# Patient Record
Sex: Female | Born: 1935 | Race: White | Hispanic: No | Smoking: Never smoker
Health system: Southern US, Community
[De-identification: ages and names within clinical notes are randomized; demographics above are authoritative.]

## PROBLEM LIST (undated history)

## (undated) DIAGNOSIS — I4430 Unspecified atrioventricular block: Secondary | ICD-10-CM

## (undated) DIAGNOSIS — I6200 Nontraumatic subdural hemorrhage, unspecified: Secondary | ICD-10-CM

## (undated) DIAGNOSIS — I5042 Chronic combined systolic (congestive) and diastolic (congestive) heart failure: Secondary | ICD-10-CM

## (undated) DIAGNOSIS — E876 Hypokalemia: Secondary | ICD-10-CM

## (undated) DIAGNOSIS — I10 Essential (primary) hypertension: Secondary | ICD-10-CM

## (undated) DIAGNOSIS — I639 Cerebral infarction, unspecified: Secondary | ICD-10-CM

## (undated) DIAGNOSIS — I255 Ischemic cardiomyopathy: Secondary | ICD-10-CM

## (undated) DIAGNOSIS — F419 Anxiety disorder, unspecified: Secondary | ICD-10-CM

## (undated) DIAGNOSIS — M1990 Unspecified osteoarthritis, unspecified site: Secondary | ICD-10-CM

## (undated) DIAGNOSIS — M8580 Other specified disorders of bone density and structure, unspecified site: Secondary | ICD-10-CM

## (undated) DIAGNOSIS — F411 Generalized anxiety disorder: Secondary | ICD-10-CM

## (undated) DIAGNOSIS — K760 Fatty (change of) liver, not elsewhere classified: Secondary | ICD-10-CM

## (undated) DIAGNOSIS — E785 Hyperlipidemia, unspecified: Secondary | ICD-10-CM

## (undated) DIAGNOSIS — G4730 Sleep apnea, unspecified: Secondary | ICD-10-CM

## (undated) DIAGNOSIS — K5792 Diverticulitis of intestine, part unspecified, without perforation or abscess without bleeding: Secondary | ICD-10-CM

## (undated) DIAGNOSIS — D696 Thrombocytopenia, unspecified: Secondary | ICD-10-CM

## (undated) DIAGNOSIS — R531 Weakness: Secondary | ICD-10-CM

## (undated) DIAGNOSIS — E039 Hypothyroidism, unspecified: Secondary | ICD-10-CM

## (undated) DIAGNOSIS — I2510 Atherosclerotic heart disease of native coronary artery without angina pectoris: Secondary | ICD-10-CM

## (undated) DIAGNOSIS — M549 Dorsalgia, unspecified: Secondary | ICD-10-CM

## (undated) HISTORY — DX: Cerebral infarction, unspecified: I63.9

## (undated) HISTORY — DX: Hypokalemia: E87.6

## (undated) HISTORY — DX: Chronic combined systolic (congestive) and diastolic (congestive) heart failure: I50.42

## (undated) HISTORY — DX: Ischemic cardiomyopathy: I25.5

## (undated) HISTORY — PX: ELBOW SURGERY: SHX618

## (undated) HISTORY — DX: Unspecified atrioventricular block: I44.30

## (undated) HISTORY — DX: Thrombocytopenia, unspecified: D69.6

## (undated) HISTORY — DX: Hypomagnesemia: E83.42

---

## 2004-03-20 ENCOUNTER — Ambulatory Visit: Payer: Self-pay

## 2005-04-08 ENCOUNTER — Ambulatory Visit: Payer: Self-pay

## 2005-05-21 ENCOUNTER — Ambulatory Visit: Payer: Self-pay

## 2005-06-01 ENCOUNTER — Ambulatory Visit: Payer: Self-pay

## 2006-06-03 ENCOUNTER — Ambulatory Visit: Payer: Self-pay

## 2006-12-27 ENCOUNTER — Ambulatory Visit: Payer: Self-pay

## 2007-02-07 ENCOUNTER — Ambulatory Visit: Payer: Self-pay | Admitting: Physician Assistant

## 2007-06-15 ENCOUNTER — Ambulatory Visit: Payer: Self-pay | Admitting: Family Medicine

## 2008-06-18 ENCOUNTER — Ambulatory Visit: Payer: Self-pay | Admitting: Family Medicine

## 2009-01-27 ENCOUNTER — Emergency Department (HOSPITAL_COMMUNITY): Admission: AC | Admit: 2009-01-27 | Discharge: 2009-01-27 | Payer: Self-pay | Admitting: Emergency Medicine

## 2009-06-19 ENCOUNTER — Ambulatory Visit: Payer: Self-pay | Admitting: Family Medicine

## 2009-12-25 ENCOUNTER — Ambulatory Visit: Payer: Self-pay | Admitting: Family Medicine

## 2010-06-24 ENCOUNTER — Ambulatory Visit: Payer: Self-pay | Admitting: Family Medicine

## 2010-10-14 DIAGNOSIS — K5790 Diverticulosis of intestine, part unspecified, without perforation or abscess without bleeding: Secondary | ICD-10-CM | POA: Insufficient documentation

## 2010-10-14 DIAGNOSIS — M159 Polyosteoarthritis, unspecified: Secondary | ICD-10-CM | POA: Insufficient documentation

## 2010-10-14 DIAGNOSIS — E039 Hypothyroidism, unspecified: Secondary | ICD-10-CM | POA: Diagnosis present

## 2010-10-14 DIAGNOSIS — G8929 Other chronic pain: Secondary | ICD-10-CM | POA: Insufficient documentation

## 2011-03-10 ENCOUNTER — Ambulatory Visit: Payer: Self-pay | Admitting: Family Medicine

## 2011-06-22 DIAGNOSIS — M19019 Primary osteoarthritis, unspecified shoulder: Secondary | ICD-10-CM | POA: Insufficient documentation

## 2011-07-14 ENCOUNTER — Ambulatory Visit: Payer: Self-pay | Admitting: Family Medicine

## 2012-07-19 ENCOUNTER — Ambulatory Visit: Payer: Self-pay | Admitting: Family Medicine

## 2012-07-29 ENCOUNTER — Ambulatory Visit: Payer: Self-pay | Admitting: Family Medicine

## 2012-08-03 DIAGNOSIS — M5417 Radiculopathy, lumbosacral region: Secondary | ICD-10-CM | POA: Insufficient documentation

## 2012-10-25 DIAGNOSIS — Z8719 Personal history of other diseases of the digestive system: Secondary | ICD-10-CM | POA: Insufficient documentation

## 2013-03-17 ENCOUNTER — Emergency Department: Payer: Self-pay | Admitting: Emergency Medicine

## 2013-03-17 LAB — BASIC METABOLIC PANEL
Anion Gap: 3 — ABNORMAL LOW (ref 7–16)
Calcium, Total: 10.3 mg/dL — ABNORMAL HIGH (ref 8.5–10.1)
Chloride: 100 mmol/L (ref 98–107)
Co2: 30 mmol/L (ref 21–32)
Creatinine: 0.64 mg/dL (ref 0.60–1.30)
EGFR (African American): >60
EGFR (Non-African Amer.): >60
Glucose: 105 mg/dL — ABNORMAL HIGH (ref 65–99)

## 2013-03-17 LAB — CBC WITH DIFFERENTIAL/PLATELET
Basophil #: 0 10*3/uL (ref 0.0–0.1)
Basophil %: 0.4 %
HGB: 11.2 g/dL — ABNORMAL LOW (ref 12.0–16.0)
Lymphocyte #: 1 10*3/uL (ref 1.0–3.6)
Lymphocyte %: 14.2 %
MCH: 30.2 pg (ref 26.0–34.0)
MCHC: 34.6 g/dL (ref 32.0–36.0)
Monocyte #: 0.7 x10 3/mm (ref 0.2–0.9)
Monocyte %: 9.5 %
Platelet: 181 10*3/uL (ref 150–440)
RBC: 3.7 10*6/uL — ABNORMAL LOW (ref 3.80–5.20)
WBC: 6.9 10*3/uL (ref 3.6–11.0)

## 2013-03-17 LAB — PROTIME-INR: Prothrombin Time: 13.6 secs (ref 11.5–14.7)

## 2013-03-26 ENCOUNTER — Emergency Department: Payer: Self-pay | Admitting: Emergency Medicine

## 2013-03-26 LAB — URINALYSIS, COMPLETE
BACTERIA: NONE SEEN
Bilirubin,UR: NEGATIVE
Glucose,UR: NEGATIVE mg/dL (ref 0–75)
Leukocyte Esterase: NEGATIVE
Nitrite: NEGATIVE
Ph: 6 (ref 4.5–8.0)
RBC,UR: 14 /HPF (ref 0–5)
SQUAMOUS EPITHELIAL: NONE SEEN
Specific Gravity: 1.015 (ref 1.003–1.030)

## 2013-03-26 LAB — COMPREHENSIVE METABOLIC PANEL
ALK PHOS: 196 U/L — AB
ALT: 16 U/L (ref 12–78)
ANION GAP: 6 — AB (ref 7–16)
Albumin: 3.2 g/dL — ABNORMAL LOW (ref 3.4–5.0)
BUN: 15 mg/dL (ref 7–18)
Bilirubin,Total: 0.4 mg/dL (ref 0.2–1.0)
CALCIUM: 10.8 mg/dL — AB (ref 8.5–10.1)
Chloride: 100 mmol/L (ref 98–107)
Co2: 30 mmol/L (ref 21–32)
Creatinine: 0.67 mg/dL (ref 0.60–1.30)
Glucose: 104 mg/dL — ABNORMAL HIGH (ref 65–99)
OSMOLALITY: 273 (ref 275–301)
Potassium: 4.2 mmol/L (ref 3.5–5.1)
SGOT(AST): 21 U/L (ref 15–37)
Sodium: 136 mmol/L (ref 136–145)
Total Protein: 7.8 g/dL (ref 6.4–8.2)

## 2013-03-26 LAB — PROTIME-INR
INR: 1
Prothrombin Time: 13 secs (ref 11.5–14.7)

## 2013-03-26 LAB — CBC
HCT: 36.1 % (ref 35.0–47.0)
HGB: 12.4 g/dL (ref 12.0–16.0)
MCH: 30 pg (ref 26.0–34.0)
MCHC: 34.3 g/dL (ref 32.0–36.0)
MCV: 87 fL (ref 80–100)
Platelet: 323 10*3/uL (ref 150–440)
RBC: 4.13 10*6/uL (ref 3.80–5.20)
RDW: 14.1 % (ref 11.5–14.5)
WBC: 8.8 10*3/uL (ref 3.6–11.0)

## 2013-03-26 LAB — TROPONIN I

## 2013-03-26 LAB — APTT: Activated PTT: 30.9 secs (ref 23.6–35.9)

## 2013-05-27 DIAGNOSIS — M4807 Spinal stenosis, lumbosacral region: Secondary | ICD-10-CM | POA: Insufficient documentation

## 2013-08-24 ENCOUNTER — Ambulatory Visit: Payer: Self-pay | Admitting: Family Medicine

## 2013-08-31 ENCOUNTER — Ambulatory Visit: Payer: Self-pay | Admitting: Neurology

## 2013-12-26 DIAGNOSIS — Z96641 Presence of right artificial hip joint: Secondary | ICD-10-CM | POA: Insufficient documentation

## 2013-12-26 DIAGNOSIS — M19011 Primary osteoarthritis, right shoulder: Secondary | ICD-10-CM | POA: Insufficient documentation

## 2013-12-26 DIAGNOSIS — M1611 Unilateral primary osteoarthritis, right hip: Secondary | ICD-10-CM | POA: Insufficient documentation

## 2014-08-02 ENCOUNTER — Other Ambulatory Visit: Payer: Self-pay | Admitting: Family Medicine

## 2014-08-02 DIAGNOSIS — M8580 Other specified disorders of bone density and structure, unspecified site: Secondary | ICD-10-CM

## 2014-08-15 ENCOUNTER — Ambulatory Visit
Admission: RE | Admit: 2014-08-15 | Discharge: 2014-08-15 | Disposition: A | Discharge status: Home / Self Care | Payer: Medicare Other | Source: Ambulatory Visit | Attending: Family Medicine | Admitting: Family Medicine

## 2014-08-15 DIAGNOSIS — M810 Age-related osteoporosis without current pathological fracture: Secondary | ICD-10-CM | POA: Diagnosis not present

## 2014-08-15 DIAGNOSIS — Z13820 Encounter for screening for osteoporosis: Secondary | ICD-10-CM | POA: Insufficient documentation

## 2014-08-15 DIAGNOSIS — R29890 Loss of height: Secondary | ICD-10-CM | POA: Diagnosis not present

## 2014-08-15 DIAGNOSIS — M1990 Unspecified osteoarthritis, unspecified site: Secondary | ICD-10-CM | POA: Insufficient documentation

## 2014-08-15 DIAGNOSIS — Z780 Asymptomatic menopausal state: Secondary | ICD-10-CM | POA: Insufficient documentation

## 2014-08-15 DIAGNOSIS — M8580 Other specified disorders of bone density and structure, unspecified site: Secondary | ICD-10-CM

## 2014-09-11 ENCOUNTER — Other Ambulatory Visit: Payer: Self-pay | Admitting: Family Medicine

## 2014-09-11 DIAGNOSIS — Z1231 Encounter for screening mammogram for malignant neoplasm of breast: Secondary | ICD-10-CM

## 2014-09-12 ENCOUNTER — Ambulatory Visit
Admission: RE | Admit: 2014-09-12 | Discharge: 2014-09-12 | Disposition: A | Discharge status: Home / Self Care | Payer: Medicare Other | Source: Ambulatory Visit | Attending: Family Medicine | Admitting: Family Medicine

## 2014-09-12 DIAGNOSIS — Z1231 Encounter for screening mammogram for malignant neoplasm of breast: Secondary | ICD-10-CM | POA: Diagnosis not present

## 2015-08-07 ENCOUNTER — Other Ambulatory Visit: Payer: Self-pay | Admitting: Family Medicine

## 2015-08-07 ENCOUNTER — Other Ambulatory Visit (HOSPITAL_COMMUNITY): Payer: Self-pay | Admitting: Family Medicine

## 2015-08-07 DIAGNOSIS — Z1231 Encounter for screening mammogram for malignant neoplasm of breast: Secondary | ICD-10-CM

## 2015-09-05 ENCOUNTER — Emergency Department
Admission: EM | Admit: 2015-09-05 | Discharge: 2015-09-05 | Disposition: A | Discharge status: Home / Self Care | Payer: Medicare Other | Source: Home / Self Care | Attending: Emergency Medicine | Admitting: Emergency Medicine

## 2015-09-05 ENCOUNTER — Emergency Department: Payer: Medicare Other

## 2015-09-05 DIAGNOSIS — E785 Hyperlipidemia, unspecified: Secondary | ICD-10-CM | POA: Insufficient documentation

## 2015-09-05 DIAGNOSIS — I252 Old myocardial infarction: Secondary | ICD-10-CM | POA: Insufficient documentation

## 2015-09-05 DIAGNOSIS — R112 Nausea with vomiting, unspecified: Secondary | ICD-10-CM | POA: Insufficient documentation

## 2015-09-05 DIAGNOSIS — I10 Essential (primary) hypertension: Secondary | ICD-10-CM

## 2015-09-05 DIAGNOSIS — R1033 Periumbilical pain: Secondary | ICD-10-CM | POA: Insufficient documentation

## 2015-09-05 DIAGNOSIS — Z791 Long term (current) use of non-steroidal anti-inflammatories (NSAID): Secondary | ICD-10-CM

## 2015-09-05 DIAGNOSIS — Z8679 Personal history of other diseases of the circulatory system: Secondary | ICD-10-CM | POA: Insufficient documentation

## 2015-09-05 DIAGNOSIS — I2510 Atherosclerotic heart disease of native coronary artery without angina pectoris: Secondary | ICD-10-CM

## 2015-09-05 DIAGNOSIS — E039 Hypothyroidism, unspecified: Secondary | ICD-10-CM

## 2015-09-05 DIAGNOSIS — Z79899 Other long term (current) drug therapy: Secondary | ICD-10-CM

## 2015-09-05 DIAGNOSIS — M1990 Unspecified osteoarthritis, unspecified site: Secondary | ICD-10-CM

## 2015-09-05 DIAGNOSIS — R197 Diarrhea, unspecified: Secondary | ICD-10-CM

## 2015-09-05 DIAGNOSIS — I6200 Nontraumatic subdural hemorrhage, unspecified: Secondary | ICD-10-CM | POA: Diagnosis not present

## 2015-09-05 DIAGNOSIS — I214 Non-ST elevation (NSTEMI) myocardial infarction: Secondary | ICD-10-CM | POA: Diagnosis not present

## 2015-09-05 HISTORY — DX: Other specified disorders of bone density and structure, unspecified site: M85.80

## 2015-09-05 HISTORY — DX: Diverticulitis of intestine, part unspecified, without perforation or abscess without bleeding: K57.92

## 2015-09-05 HISTORY — DX: Sleep apnea, unspecified: G47.30

## 2015-09-05 HISTORY — DX: Weakness: R53.1

## 2015-09-05 HISTORY — DX: Hypothyroidism, unspecified: E03.9

## 2015-09-05 HISTORY — DX: Essential (primary) hypertension: I10

## 2015-09-05 HISTORY — DX: Dorsalgia, unspecified: M54.9

## 2015-09-05 HISTORY — DX: Generalized anxiety disorder: F41.1

## 2015-09-05 LAB — COMPREHENSIVE METABOLIC PANEL
ALBUMIN: 4.3 g/dL (ref 3.5–5.0)
ALK PHOS: 64 U/L (ref 38–126)
ALT: 18 U/L (ref 14–54)
AST: 58 U/L — AB (ref 15–41)
Anion gap: 11 (ref 5–15)
BILIRUBIN TOTAL: 0.8 mg/dL (ref 0.3–1.2)
BUN: 14 mg/dL (ref 6–20)
CO2: 25 mmol/L (ref 22–32)
CREATININE: 0.68 mg/dL (ref 0.44–1.00)
Calcium: 10.1 mg/dL (ref 8.9–10.3)
Chloride: 104 mmol/L (ref 101–111)
GFR calc Af Amer: >60 mL/min (ref >60)
GFR calc non Af Amer: >60 mL/min (ref >60)
GLUCOSE: 129 mg/dL — AB (ref 65–99)
POTASSIUM: 3.8 mmol/L (ref 3.5–5.1)
Sodium: 140 mmol/L (ref 135–145)
TOTAL PROTEIN: 7 g/dL (ref 6.5–8.1)

## 2015-09-05 LAB — URINALYSIS COMPLETE WITH MICROSCOPIC (ARMC ONLY)
BILIRUBIN URINE: NEGATIVE
GLUCOSE, UA: NEGATIVE mg/dL
HGB URINE DIPSTICK: NEGATIVE
LEUKOCYTES UA: NEGATIVE
Nitrite: NEGATIVE
Protein, ur: 30 mg/dL — AB
SPECIFIC GRAVITY, URINE: 1.013 (ref 1.005–1.030)
pH: 7 (ref 5.0–8.0)

## 2015-09-05 LAB — CBC
HEMATOCRIT: 43.5 % (ref 35.0–47.0)
HEMOGLOBIN: 14.9 g/dL (ref 12.0–16.0)
MCH: 31.3 pg (ref 26.0–34.0)
MCHC: 34.4 g/dL (ref 32.0–36.0)
MCV: 91.1 fL (ref 80.0–100.0)
Platelets: 116 10*3/uL — ABNORMAL LOW (ref 150–440)
RBC: 4.77 MIL/uL (ref 3.80–5.20)
RDW: 13.9 % (ref 11.5–14.5)
WBC: 6.9 10*3/uL (ref 3.6–11.0)

## 2015-09-05 LAB — LIPASE, BLOOD: Lipase: 29 U/L (ref 11–51)

## 2015-09-05 MED ORDER — IOPAMIDOL (ISOVUE-300) INJECTION 61%
100.0000 mL | Freq: Once | INTRAVENOUS | Status: AC | PRN
  Administered 2015-09-05: 100 mL via INTRAVENOUS

## 2015-09-05 MED ORDER — SODIUM CHLORIDE 0.9 % IV BOLUS (SEPSIS): 1000.0000 mL | Freq: Once | INTRAVENOUS | Status: DC

## 2015-09-05 MED ORDER — SODIUM CHLORIDE 0.9 % IV BOLUS (SEPSIS)
1000.0000 mL | Freq: Once | INTRAVENOUS | Status: AC
  Administered 2015-09-05: 1000 mL via INTRAVENOUS

## 2015-09-05 MED ORDER — METOCLOPRAMIDE HCL 5 MG/ML IJ SOLN
10.0000 mg | Freq: Once | INTRAMUSCULAR | Status: AC
  Administered 2015-09-05: 10 mg via INTRAVENOUS

## 2015-09-05 MED ORDER — DIATRIZOATE MEGLUMINE & SODIUM 66-10 % PO SOLN
15.0000 mL | Freq: Once | ORAL | Status: AC
  Administered 2015-09-05: 15 mL via ORAL

## 2015-09-05 MED ORDER — METOCLOPRAMIDE HCL 5 MG/ML IJ SOLN
INTRAMUSCULAR | Status: AC
  Administered 2015-09-05: 10 mg via INTRAVENOUS
  Filled 2015-09-05: qty 2

## 2015-09-05 MED ORDER — PROMETHAZINE HCL 25 MG RE SUPP
25.0000 mg | Freq: Four times a day (QID) | RECTAL | Status: DC | PRN
Start: 2015-09-05 — End: 2015-12-02

## 2015-09-05 MED ORDER — METOCLOPRAMIDE HCL 5 MG PO TABS: 5.0000 mg | ORAL_TABLET | Freq: Three times a day (TID) | ORAL | Status: DC | PRN

## 2015-09-05 MED ORDER — SIMETHICONE 80 MG PO CHEW: 80.0000 mg | CHEWABLE_TABLET | Freq: Four times a day (QID) | ORAL | Status: DC | PRN

## 2015-09-05 MED ORDER — ONDANSETRON HCL 4 MG/2ML IJ SOLN
INTRAMUSCULAR | Status: AC
  Administered 2015-09-05: 4 mg via INTRAVENOUS
  Filled 2015-09-05: qty 2

## 2015-09-05 MED ORDER — KETOROLAC TROMETHAMINE 30 MG/ML IJ SOLN
30.0000 mg | Freq: Once | INTRAMUSCULAR | Status: AC
  Administered 2015-09-05: 30 mg via INTRAVENOUS
  Filled 2015-09-05: qty 1

## 2015-09-05 MED ORDER — METOCLOPRAMIDE HCL 10 MG PO TABS
5.0000 mg | ORAL_TABLET | Freq: Once | ORAL | Status: AC
  Administered 2015-09-05: 5 mg via ORAL
  Filled 2015-09-05: qty 1

## 2015-09-05 MED ORDER — FENTANYL CITRATE (PF) 100 MCG/2ML IJ SOLN: 50.0000 ug | Freq: Once | INTRAMUSCULAR | Status: DC

## 2015-09-05 MED ORDER — ONDANSETRON HCL 4 MG/2ML IJ SOLN: 4.0000 mg | Freq: Once | INTRAMUSCULAR | Status: DC

## 2015-09-05 MED ORDER — ONDANSETRON HCL 4 MG/2ML IJ SOLN
4.0000 mg | Freq: Once | INTRAMUSCULAR | Status: AC
  Administered 2015-09-05: 4 mg via INTRAVENOUS

## 2015-09-05 MED ORDER — FENTANYL CITRATE (PF) 100 MCG/2ML IJ SOLN
50.0000 ug | Freq: Once | INTRAMUSCULAR | Status: AC
  Administered 2015-09-05: 50 ug via INTRAVENOUS

## 2015-09-05 MED ORDER — FENTANYL CITRATE (PF) 100 MCG/2ML IJ SOLN
INTRAMUSCULAR | Status: AC
  Administered 2015-09-05: 50 ug via INTRAVENOUS
  Filled 2015-09-05: qty 2

## 2015-09-05 NOTE — ED Notes (Signed)
Pt placed on 2L oxygen 

## 2015-09-05 NOTE — ED Notes (Signed)
Pt from home via EMS, reports mid-abd pain since this morning with constant nausea, reports initial diarrhea but took peptp-bismal and has not had a bowel movement since.

## 2015-09-05 NOTE — Discharge Instructions (Signed)
Please take a clear liquid diet for the next 24-48 hours, then advance to a bland BRAT diet as described. May take Tylenol or Motrin for pain, and simethicone if you feel like you have gas. Reglan is for nausea and vomiting.  Return to the emergency department if you develop severe pain, fever, inability to keep down fluids, lightheadedness or fainting, or any other symptoms concerning to you.

## 2015-09-05 NOTE — ED Provider Notes (Signed)
[facility] Emergency Department Provider Note  ____________________________________________  Time seen: Approximately 9:06 PM  I have reviewed the triage vital signs and the nursing notes.   HISTORY  Chief Complaint Abdominal Pain    HPI Teresa Franklin is a 82 y.o. female with a history of hypertension, S/P remote appendectomy, s/p remote G-tubesent in with periumbilical pain, nausea and a single episode of diarrhea. The patient reports that this morning she awoke with a severe periumbilical pain, and had one episode of stool. Since then, she has continued to pass gas. She has had severe nausea and has tried to make her vomit but has been unable to do so. She denies any fever, chills, dysuria, abdominal distention. She has tried Pepto-Bismol, and chewable "burping medicine" without any improvement. No known sick contacts or travel outside the [place].   Past Medical History  Diagnosis Date  . Hypertension   . Diverticulitis   . Osteopenia   . Hypothyroidism   . Hepatic artery stenosis ([place])   . Sleep apnea   . Subdural hematoma ([place])   . Weakness   . Generalized anxiety disorder   . Back pain     There are no active problems to display for this patient.   No past surgical history on file.  Current Outpatient Rx  Name  Route  Sig  Dispense  Refill  . acetaminophen (TYLENOL) 325 MG tablet   Oral   Take 650 mg by mouth every 6 (six) hours as needed for mild pain.         . calcium-vitamin D (OSCAL WITH D) 500-200 MG-UNIT tablet   Oral   Take 1 tablet by mouth 2 (two) times daily.         . carvedilol (COREG) 12.5 MG tablet   Oral   Take 12.5 mg by mouth 2 (two) times daily.         . FOLBIC 2.5-25-2 MG TABS tablet   Oral   Take 1 tablet by mouth daily.           Dispense as written.   Marland Kitchen levothyroxine (SYNTHROID, LEVOTHROID) 25 MCG tablet   Oral   Take 25 mcg by mouth daily.         Marland Kitchen lisinopril (PRINIVIL,ZESTRIL) 40 MG  tablet   Oral   Take 40 mg by mouth daily.         Marland Kitchen LORazepam (ATIVAN) 0.5 MG tablet   Oral   Take 0.5 mg by mouth 2 (two) times daily as needed for anxiety.         . meloxicam (MOBIC) 7.5 MG tablet   Oral   Take 7.5 mg by mouth daily as needed for pain.         Marland Kitchen omeprazole (PRILOSEC) 20 MG capsule   Oral   Take 20 mg by mouth daily.         . pravastatin (PRAVACHOL) 40 MG tablet   Oral   Take 40 mg by mouth every evening.         . metoCLOPramide (REGLAN) 5 MG tablet   Oral   Take 1 tablet (5 mg total) by mouth every 8 (eight) hours as needed for nausea.   15 tablet   0   . promethazine (PHENERGAN) 25 MG suppository   Rectal   Place 1 suppository (25 mg total) rectally every 6 (six) hours as needed for nausea.   12 suppository   0   . simethicone (GAS-X)  80 MG chewable tablet   Oral   Chew 1 tablet (80 mg total) by mouth 4 (four) times daily as needed for flatulence.   20 tablet   0     Allergies Codeine; Flomax; Norco; and Sulfa antibiotics  No family history on file.  Social History Social History  Substance Use Topics  . Smoking status: Never Smoker   . Smokeless tobacco: None  . Alcohol Use: No    Review of Systems Constitutional: No fever/chills.No lightheadedness or syncope. Eyes: No visual changes. ENT: No sore throat. No congestion or rhinorrhea. Cardiovascular: Denies chest pain. Denies palpitations. Respiratory: Denies shortness of breath.  No cough. Gastrointestinal: Positive periumbilical abdominal pain.  Positive nausea, no vomiting.  Positive diarrhea.  No constipation. Continued normal flatus  Genitourinary: Negative for dysuria. Musculoskeletal: Negative for back pain. Skin: Negative for rash. Neurological: Negative for headaches. No focal numbness, tingling or weakness.   10-point ROS otherwise negative.  ____________________________________________   PHYSICAL EXAM:  VITAL SIGNS: ED Triage Vitals  Enc Vitals  Group     BP 09/05/15 2100 168/80 mmHg     Pulse Rate 09/05/15 2100 58     Resp 09/05/15 2100 16     Temp 09/05/15 2100 98.4 F (36.9 C)     Temp src --      SpO2 09/05/15 2100 98 %     Weight --      Height --      Head Cir --      Peak Flow --      Pain Score 09/05/15 2054 10     Pain Loc --      Pain Edu? --      Excl. in [place]? --     Constitutional: Alert and oriented. Uncomfortable appearing and mildly dehydrated appearing but nontoxic. Answers questions appropriately. Eyes: Conjunctivae are normal.  EOMI. No scleral icterus. Head: Atraumatic. Nose: No congestion/rhinnorhea. Mouth/Throat: Mucous membranes are moist.  Neck: No stridor.  Supple.  No JVD. No meningismus. Cardiovascular: Normal rate, regular rhythm. No murmurs, rubs or gallops.  Respiratory: Normal respiratory effort.  No accessory muscle use or retractions. Lungs CTAB.  No wheezes, rales or ronchi. Gastrointestinal: Soft  and nondistended.   Only tender to centimeters above the umbilicus. No guarding or rebound.  No peritoneal signs. Musculoskeletal: No LE edema. No ttp in the calves or palpable cords.  Negative Homan's sign. Neurologic:  A&Ox3.  Speech is clear.  Face and smile are symmetric.  EOMI.  Moves all extremities well. Skin:  Skin is warm, dry and intact. No rash noted. Psychiatric: Mood and affect are normal. Speech and behavior are normal.  Normal judgement.  ____________________________________________   LABS (all labs ordered are listed, but only abnormal results are displayed)  Labs Reviewed  CBC - Abnormal; Notable for the following:    Platelets 116 (*)    All other components within normal limits  COMPREHENSIVE METABOLIC PANEL - Abnormal; Notable for the following:    Glucose, Bld 129 (*)    AST 58 (*)    All other components within normal limits  URINALYSIS COMPLETEWITH MICROSCOPIC ([facility] ONLY) - Abnormal; Notable for the following:    Color, Urine YELLOW (*)    APPearance CLOUDY (*)     Ketones, ur 1+ (*)    Protein, ur 30 (*)    Bacteria, UA RARE (*)    Squamous Epithelial / LPF 0-5 (*)    All other components within normal limits  LIPASE, BLOOD  ____________________________________________  EKG  ED ECG REPORT I, Eula Listen, the attending physician, personally viewed and interpreted this ECG.   Date: 09/05/2015  EKG Time: 2101  Rate: 60  Rhythm: normal sinus rhythm  Axis: normal  Intervals:none  ST&T Change: No ST elevation.  ____________________________________________  RADIOLOGY  Ct Abdomen Pelvis W Contrast  09/05/2015  CLINICAL DATA:  Mid abdominal and periumbilical pain with nausea, vomiting, and diarrhea. EXAM: CT ABDOMEN AND PELVIS WITH CONTRAST TECHNIQUE: Multidetector CT imaging of the abdomen and pelvis was performed using the standard protocol following bolus administration of intravenous contrast. CONTRAST:  122mL ISOVUE-300 IOPAMIDOL (ISOVUE-300) INJECTION 61% COMPARISON:  None. FINDINGS: Atelectasis or infiltration in the lung bases. Small esophageal hiatal hernia. Diffuse fatty infiltration of the liver. Sub cm focal spleen lesions are nonspecific though probably represent cysts or hemangiomas. Gallbladder, pancreas, adrenal glands, kidneys, inferior vena cava, and retroperitoneal lymph nodes are unremarkable. Calcification of abdominal aorta and branch vessels. No aneurysm. Stomach, small bowel, and colon are not abnormally distended. No free air or free fluid in the abdomen. Pelvis: The appendix is not identified. Uterus and ovaries are not enlarged. Bladder wall is not thickened. Diverticulosis and muscular hypertrophy of the sigmoid colon. No inflammatory changes to suggest diverticulitis. No free or loculated pelvic fluid collections. No pelvic mass or lymphadenopathy. Degenerative changes and scoliosis of the lumbar spine. Prior right hip arthroplasty. IMPRESSION: No evidence of bowel obstruction or inflammation. Diverticulosis of the  sigmoid colon without evidence of diverticulitis. Diffuse fatty infiltration of the liver. Small esophageal hiatal hernia. Infiltrates or atelectasis in the lung bases. Electronically Signed   By: Lucienne Capers M.D.   On: 09/05/2015 22:55    ____________________________________________   PROCEDURES  Procedure(s) performed: None  Critical Care performed: No ____________________________________________   INITIAL IMPRESSION / ASSESSMENT AND PLAN / ED COURSE  Pertinent labs & imaging results that were available during my care of the patient were reviewed by me and considered in my medical decision making (see chart for details).  82 y.o. female with a history of abdominal surgery presenting with nausea, periumbilical pain, and one episode of diarrhea. There are multiple possible etiologies including partial small bowel obstruction, gallbladder disease although this is much less likely because she does not have Murphy sign, UTI, viral or foodborne GI illness, diverticulitis or colitis. We'll get a CT scan, basic labs and initiate symptomatically treatment.  ----------------------------------------- 11:08 PM on 09/05/2015 ----------------------------------------- The patient's labs are reassuring, her vital signs remained stable, and her CT scan does not show any acute intra-abdominal pathology. We will plan to work towards discharge but at this time she continues to have some mild pain and some nausea although she does feel that the Reglan helped more than Zofran. If she is able to tolerate liquid by mouth, we'll plan to discharge home.   ----------------------------------------- 11:37 PM on 09/05/2015 -----------------------------------------  At this time, the patient is more comfortable and is able to keep down fluids. We'll plan discharge home.   ____________________________________________  FINAL CLINICAL IMPRESSION(S) / ED DIAGNOSES  Final diagnoses:  Periumbilical pain   Nausea vomiting and diarrhea      NEW MEDICATIONS STARTED DURING THIS VISIT:  New Prescriptions   METOCLOPRAMIDE (REGLAN) 5 MG TABLET    Take 1 tablet (5 mg total) by mouth every 8 (eight) hours as needed for nausea.   PROMETHAZINE (PHENERGAN) 25 MG SUPPOSITORY    Place 1 suppository (25 mg total) rectally every 6 (six) hours as needed for nausea.  SIMETHICONE (GAS-X) 80 MG CHEWABLE TABLET    Chew 1 tablet (80 mg total) by mouth 4 (four) times daily as needed for flatulence.     Eula Listen, MD 09/05/15 2337

## 2015-09-06 ENCOUNTER — Emergency Department: Payer: Medicare Other

## 2015-09-06 ENCOUNTER — Encounter: Payer: Self-pay | Admitting: *Deleted

## 2015-09-06 ENCOUNTER — Inpatient Hospital Stay
Admission: EM | Admit: 2015-09-06 | Discharge: 2015-09-08 | DRG: 281 | Disposition: A | Discharge status: Other Acute Inpatient Hospital | Payer: Medicare Other | Attending: Internal Medicine | Admitting: Internal Medicine

## 2015-09-06 ENCOUNTER — Ambulatory Visit (INDEPENDENT_AMBULATORY_CARE_PROVIDER_SITE_OTHER)
Admission: EM | Admit: 2015-09-06 | Discharge: 2015-09-06 | Disposition: A | Discharge status: Home / Self Care | Payer: Medicare Other | Source: Home / Self Care | Attending: Family Medicine | Admitting: Family Medicine

## 2015-09-06 DIAGNOSIS — I071 Rheumatic tricuspid insufficiency: Secondary | ICD-10-CM | POA: Diagnosis present

## 2015-09-06 DIAGNOSIS — Z823 Family history of stroke: Secondary | ICD-10-CM

## 2015-09-06 DIAGNOSIS — I441 Atrioventricular block, second degree: Secondary | ICD-10-CM | POA: Diagnosis not present

## 2015-09-06 DIAGNOSIS — Z888 Allergy status to other drugs, medicaments and biological substances status: Secondary | ICD-10-CM

## 2015-09-06 DIAGNOSIS — Z791 Long term (current) use of non-steroidal anti-inflammatories (NSAID): Secondary | ICD-10-CM

## 2015-09-06 DIAGNOSIS — I959 Hypotension, unspecified: Secondary | ICD-10-CM | POA: Diagnosis present

## 2015-09-06 DIAGNOSIS — I213 ST elevation (STEMI) myocardial infarction of unspecified site: Secondary | ICD-10-CM | POA: Diagnosis not present

## 2015-09-06 DIAGNOSIS — E785 Hyperlipidemia, unspecified: Secondary | ICD-10-CM | POA: Diagnosis present

## 2015-09-06 DIAGNOSIS — E039 Hypothyroidism, unspecified: Secondary | ICD-10-CM | POA: Diagnosis present

## 2015-09-06 DIAGNOSIS — R110 Nausea: Secondary | ICD-10-CM | POA: Diagnosis not present

## 2015-09-06 DIAGNOSIS — G4730 Sleep apnea, unspecified: Secondary | ICD-10-CM | POA: Diagnosis present

## 2015-09-06 DIAGNOSIS — I10 Essential (primary) hypertension: Secondary | ICD-10-CM | POA: Diagnosis not present

## 2015-09-06 DIAGNOSIS — I214 Non-ST elevation (NSTEMI) myocardial infarction: Principal | ICD-10-CM | POA: Diagnosis present

## 2015-09-06 DIAGNOSIS — D649 Anemia, unspecified: Secondary | ICD-10-CM | POA: Diagnosis not present

## 2015-09-06 DIAGNOSIS — Z8042 Family history of malignant neoplasm of prostate: Secondary | ICD-10-CM

## 2015-09-06 DIAGNOSIS — R1033 Periumbilical pain: Secondary | ICD-10-CM | POA: Diagnosis not present

## 2015-09-06 DIAGNOSIS — I6200 Nontraumatic subdural hemorrhage, unspecified: Secondary | ICD-10-CM | POA: Diagnosis present

## 2015-09-06 DIAGNOSIS — Z885 Allergy status to narcotic agent status: Secondary | ICD-10-CM

## 2015-09-06 DIAGNOSIS — I2510 Atherosclerotic heart disease of native coronary artery without angina pectoris: Secondary | ICD-10-CM | POA: Diagnosis not present

## 2015-09-06 DIAGNOSIS — Z833 Family history of diabetes mellitus: Secondary | ICD-10-CM

## 2015-09-06 DIAGNOSIS — K760 Fatty (change of) liver, not elsewhere classified: Secondary | ICD-10-CM | POA: Diagnosis present

## 2015-09-06 DIAGNOSIS — M1990 Unspecified osteoarthritis, unspecified site: Secondary | ICD-10-CM | POA: Diagnosis present

## 2015-09-06 DIAGNOSIS — J9601 Acute respiratory failure with hypoxia: Secondary | ICD-10-CM | POA: Diagnosis present

## 2015-09-06 DIAGNOSIS — Z8249 Family history of ischemic heart disease and other diseases of the circulatory system: Secondary | ICD-10-CM

## 2015-09-06 DIAGNOSIS — F411 Generalized anxiety disorder: Secondary | ICD-10-CM | POA: Diagnosis present

## 2015-09-06 DIAGNOSIS — R710 Precipitous drop in hematocrit: Secondary | ICD-10-CM | POA: Diagnosis not present

## 2015-09-06 DIAGNOSIS — R001 Bradycardia, unspecified: Secondary | ICD-10-CM | POA: Diagnosis present

## 2015-09-06 DIAGNOSIS — I442 Atrioventricular block, complete: Secondary | ICD-10-CM | POA: Diagnosis not present

## 2015-09-06 DIAGNOSIS — I2111 ST elevation (STEMI) myocardial infarction involving right coronary artery: Secondary | ICD-10-CM | POA: Diagnosis not present

## 2015-09-06 DIAGNOSIS — Z79899 Other long term (current) drug therapy: Secondary | ICD-10-CM | POA: Diagnosis not present

## 2015-09-06 DIAGNOSIS — D62 Acute posthemorrhagic anemia: Secondary | ICD-10-CM | POA: Diagnosis present

## 2015-09-06 DIAGNOSIS — Z882 Allergy status to sulfonamides status: Secondary | ICD-10-CM

## 2015-09-06 DIAGNOSIS — M8580 Other specified disorders of bone density and structure, unspecified site: Secondary | ICD-10-CM | POA: Diagnosis present

## 2015-09-06 DIAGNOSIS — R570 Cardiogenic shock: Secondary | ICD-10-CM | POA: Diagnosis present

## 2015-09-06 HISTORY — DX: Fatty (change of) liver, not elsewhere classified: K76.0

## 2015-09-06 HISTORY — DX: Nontraumatic subdural hemorrhage, unspecified: I62.00

## 2015-09-06 HISTORY — DX: Hyperlipidemia, unspecified: E78.5

## 2015-09-06 HISTORY — DX: Unspecified osteoarthritis, unspecified site: M19.90

## 2015-09-06 LAB — URINALYSIS COMPLETE WITH MICROSCOPIC (ARMC ONLY)
Bacteria, UA: NONE SEEN
Bilirubin Urine: NEGATIVE
Glucose, UA: NEGATIVE mg/dL
Hgb urine dipstick: NEGATIVE
Leukocytes, UA: NEGATIVE
Nitrite: NEGATIVE
Protein, ur: 30 mg/dL — AB
Specific Gravity, Urine: 1.035 — ABNORMAL HIGH (ref 1.005–1.030)
pH: 5 (ref 5.0–8.0)

## 2015-09-06 LAB — CBC
HCT: 43.3 % (ref 35.0–47.0)
Hemoglobin: 14.8 g/dL (ref 12.0–16.0)
MCH: 31.1 pg (ref 26.0–34.0)
MCHC: 34.2 g/dL (ref 32.0–36.0)
MCV: 91 fL (ref 80.0–100.0)
PLATELETS: 116 10*3/uL — AB (ref 150–440)
RBC: 4.76 MIL/uL (ref 3.80–5.20)
RDW: 13.8 % (ref 11.5–14.5)
WBC: 10.6 10*3/uL (ref 3.6–11.0)

## 2015-09-06 LAB — TROPONIN I
TROPONIN I: 10.87 ng/mL — AB (ref <0.031)
Troponin I: 8.3 ng/mL — ABNORMAL HIGH (ref <0.031)
Troponin I: 10.67 ng/mL — ABNORMAL HIGH (ref <0.031)

## 2015-09-06 LAB — COMPREHENSIVE METABOLIC PANEL
ALT: 27 U/L (ref 14–54)
AST: 140 U/L — ABNORMAL HIGH (ref 15–41)
Albumin: 4.5 g/dL (ref 3.5–5.0)
Alkaline Phosphatase: 64 U/L (ref 38–126)
Anion gap: 9 (ref 5–15)
BUN: 14 mg/dL (ref 6–20)
CO2: 27 mmol/L (ref 22–32)
Calcium: 9.9 mg/dL (ref 8.9–10.3)
Chloride: 104 mmol/L (ref 101–111)
Creatinine, Ser: 0.77 mg/dL (ref 0.44–1.00)
GFR calc Af Amer: >60 mL/min (ref >60)
GFR calc non Af Amer: >60 mL/min (ref >60)
Glucose, Bld: 134 mg/dL — ABNORMAL HIGH (ref 65–99)
Potassium: 3.9 mmol/L (ref 3.5–5.1)
Sodium: 140 mmol/L (ref 135–145)
Total Bilirubin: 0.7 mg/dL (ref 0.3–1.2)
Total Protein: 7.2 g/dL (ref 6.5–8.1)

## 2015-09-06 LAB — PROTIME-INR
INR: 1.18
PROTHROMBIN TIME: 15.2 s — AB (ref 11.4–15.0)

## 2015-09-06 LAB — LIPID PANEL
CHOL/HDL RATIO: 3.6 ratio
Cholesterol: 173 mg/dL (ref 0–200)
HDL: 48 mg/dL (ref >40)
LDL CALC: 114 mg/dL — AB (ref 0–99)
Triglycerides: 57 mg/dL (ref <150)
VLDL: 11 mg/dL (ref 0–40)

## 2015-09-06 LAB — LIPASE, BLOOD: Lipase: 27 U/L (ref 11–51)

## 2015-09-06 LAB — APTT: APTT: 148 s — AB (ref 24–36)

## 2015-09-06 MED ORDER — LORAZEPAM 0.5 MG PO TABS
ORAL_TABLET | ORAL | Status: AC
  Administered 2015-09-06: 0.5 mg via ORAL
  Filled 2015-09-06: qty 1

## 2015-09-06 MED ORDER — VITAMIN B-6 50 MG PO TABS
25.0000 mg | ORAL_TABLET | Freq: Every day | ORAL | Status: DC
  Filled 2015-09-06: qty 1
  Filled 2015-09-06: qty 0.5
  Filled 2015-09-06: qty 1

## 2015-09-06 MED ORDER — LEVOTHYROXINE SODIUM 25 MCG PO TABS
25.0000 ug | ORAL_TABLET | Freq: Every day | ORAL | Status: DC
  Administered 2015-09-07 – 2015-09-08 (×2): 25 ug via ORAL
  Filled 2015-09-06 (×2): qty 1

## 2015-09-06 MED ORDER — HEPARIN (PORCINE) IN NACL 100-0.45 UNIT/ML-% IJ SOLN
700.0000 [IU]/h | INTRAMUSCULAR | Status: DC
  Administered 2015-09-06 – 2015-09-07 (×2): 700 [IU]/h via INTRAVENOUS
  Filled 2015-09-06 (×3): qty 250

## 2015-09-06 MED ORDER — FA-PYRIDOXINE-CYANOCOBALAMIN 2.5-25-2 MG PO TABS: 1.0000 | ORAL_TABLET | Freq: Every day | ORAL | Status: DC

## 2015-09-06 MED ORDER — ONDANSETRON HCL 4 MG/2ML IJ SOLN
4.0000 mg | Freq: Once | INTRAMUSCULAR | Status: AC | PRN
  Administered 2015-09-06: 4 mg via INTRAVENOUS
  Filled 2015-09-06: qty 2

## 2015-09-06 MED ORDER — ONDANSETRON HCL 4 MG/2ML IJ SOLN
4.0000 mg | Freq: Four times a day (QID) | INTRAMUSCULAR | Status: DC | PRN
  Administered 2015-09-06 – 2015-09-08 (×2): 4 mg via INTRAVENOUS
  Filled 2015-09-06 (×2): qty 2

## 2015-09-06 MED ORDER — PROMETHAZINE HCL 25 MG RE SUPP
25.0000 mg | Freq: Four times a day (QID) | RECTAL | Status: DC | PRN
  Filled 2015-09-06: qty 1

## 2015-09-06 MED ORDER — ACETAMINOPHEN 325 MG PO TABS
650.0000 mg | ORAL_TABLET | Freq: Four times a day (QID) | ORAL | Status: DC | PRN
  Administered 2015-09-06 – 2015-09-08 (×3): 650 mg via ORAL
  Filled 2015-09-06 (×4): qty 2

## 2015-09-06 MED ORDER — OCUVITE-LUTEIN PO CAPS
2.0000 | ORAL_CAPSULE | Freq: Every day | ORAL | Status: DC
  Administered 2015-09-08: 2 via ORAL
  Filled 2015-09-06 (×2): qty 2

## 2015-09-06 MED ORDER — SIMETHICONE 80 MG PO CHEW
80.0000 mg | CHEWABLE_TABLET | Freq: Four times a day (QID) | ORAL | Status: DC | PRN
  Filled 2015-09-06: qty 1

## 2015-09-06 MED ORDER — HYDROMORPHONE HCL 1 MG/ML IJ SOLN
0.5000 mg | Freq: Once | INTRAMUSCULAR | Status: AC
Start: 2015-09-06 — End: 2015-09-06
  Administered 2015-09-06: 0.5 mg via INTRAVENOUS
  Filled 2015-09-06: qty 1

## 2015-09-06 MED ORDER — METOCLOPRAMIDE HCL 5 MG/ML IJ SOLN
10.0000 mg | Freq: Once | INTRAMUSCULAR | Status: AC
  Administered 2015-09-06: 10 mg via INTRAVENOUS
  Filled 2015-09-06: qty 2

## 2015-09-06 MED ORDER — ASPIRIN 81 MG PO CHEW
81.0000 mg | CHEWABLE_TABLET | Freq: Every day | ORAL | Status: DC
  Administered 2015-09-07: 81 mg via ORAL
  Filled 2015-09-06 (×2): qty 1

## 2015-09-06 MED ORDER — HEPARIN BOLUS VIA INFUSION
3500.0000 [IU] | Freq: Once | INTRAVENOUS | Status: AC
  Administered 2015-09-06: 3500 [IU] via INTRAVENOUS
  Filled 2015-09-06: qty 3500

## 2015-09-06 MED ORDER — MAGNESIUM OXIDE 400 (241.3 MG) MG PO TABS
400.0000 mg | ORAL_TABLET | Freq: Every day | ORAL | Status: DC
  Filled 2015-09-06 (×3): qty 1

## 2015-09-06 MED ORDER — FOLIC ACID 1 MG PO TABS
2.5000 mg | ORAL_TABLET | Freq: Every day | ORAL | Status: DC
  Filled 2015-09-06 (×2): qty 3

## 2015-09-06 MED ORDER — ACETAMINOPHEN 650 MG RE SUPP: 650.0000 mg | Freq: Four times a day (QID) | RECTAL | Status: DC | PRN

## 2015-09-06 MED ORDER — VITAMIN B-12 1000 MCG PO TABS
2000.0000 ug | ORAL_TABLET | Freq: Every day | ORAL | Status: DC
  Filled 2015-09-06 (×2): qty 2

## 2015-09-06 MED ORDER — LORAZEPAM 1 MG PO TABS: 1.0000 mg | ORAL_TABLET | Freq: Four times a day (QID) | ORAL | Status: DC | PRN

## 2015-09-06 MED ORDER — METOCLOPRAMIDE HCL 5 MG/ML IJ SOLN
10.0000 mg | Freq: Once | INTRAMUSCULAR | Status: AC
  Administered 2015-09-06: 10 mg via INTRAVENOUS

## 2015-09-06 MED ORDER — CARVEDILOL 6.25 MG PO TABS
12.5000 mg | ORAL_TABLET | Freq: Two times a day (BID) | ORAL | Status: DC
  Administered 2015-09-06 – 2015-09-07 (×3): 12.5 mg via ORAL
  Filled 2015-09-06 (×4): qty 2

## 2015-09-06 MED ORDER — NITROGLYCERIN 2 % TD OINT
1.0000 [in_us] | TOPICAL_OINTMENT | Freq: Once | TRANSDERMAL | Status: AC
  Administered 2015-09-06: 1 [in_us] via TOPICAL
  Filled 2015-09-06: qty 1

## 2015-09-06 MED ORDER — SENNOSIDES-DOCUSATE SODIUM 8.6-50 MG PO TABS
1.0000 | ORAL_TABLET | Freq: Every evening | ORAL | Status: DC | PRN
  Administered 2015-09-07: 1 via ORAL
  Filled 2015-09-06: qty 1

## 2015-09-06 MED ORDER — ADULT MULTIVITAMIN W/MINERALS CH
1.0000 | ORAL_TABLET | Freq: Every day | ORAL | Status: DC
  Filled 2015-09-06: qty 1

## 2015-09-06 MED ORDER — METOCLOPRAMIDE HCL 10 MG PO TABS: 5.0000 mg | ORAL_TABLET | Freq: Three times a day (TID) | ORAL | Status: DC | PRN

## 2015-09-06 MED ORDER — ONDANSETRON HCL 4 MG PO TABS: 4.0000 mg | ORAL_TABLET | Freq: Four times a day (QID) | ORAL | Status: DC | PRN

## 2015-09-06 MED ORDER — LORAZEPAM 0.5 MG PO TABS
0.5000 mg | ORAL_TABLET | Freq: Four times a day (QID) | ORAL | Status: DC | PRN
  Administered 2015-09-06: 0.5 mg via ORAL
  Filled 2015-09-06: qty 1

## 2015-09-06 MED ORDER — SODIUM CHLORIDE 0.9 % IV SOLN
INTRAVENOUS | Status: DC
  Administered 2015-09-06 – 2015-09-07 (×3): via INTRAVENOUS

## 2015-09-06 MED ORDER — LORAZEPAM 0.5 MG PO TABS
0.5000 mg | ORAL_TABLET | Freq: Two times a day (BID) | ORAL | Status: DC | PRN
  Administered 2015-09-07 (×2): 0.5 mg via ORAL
  Filled 2015-09-06 (×2): qty 1

## 2015-09-06 MED ORDER — ASPIRIN 81 MG PO CHEW
324.0000 mg | CHEWABLE_TABLET | Freq: Once | ORAL | Status: AC
  Administered 2015-09-06: 324 mg via ORAL
  Filled 2015-09-06: qty 4

## 2015-09-06 MED ORDER — CALCIUM CARBONATE-VITAMIN D 500-200 MG-UNIT PO TABS
1.0000 | ORAL_TABLET | Freq: Two times a day (BID) | ORAL | Status: DC
  Filled 2015-09-06 (×3): qty 1

## 2015-09-06 MED ORDER — NITROGLYCERIN 0.4 MG SL SUBL: 0.4000 mg | SUBLINGUAL_TABLET | SUBLINGUAL | Status: DC | PRN

## 2015-09-06 MED ORDER — PRAVASTATIN SODIUM 20 MG PO TABS
40.0000 mg | ORAL_TABLET | Freq: Every day | ORAL | Status: DC
  Administered 2015-09-06 – 2015-09-07 (×2): 40 mg via ORAL
  Filled 2015-09-06 (×3): qty 1

## 2015-09-06 MED ORDER — PANTOPRAZOLE SODIUM 40 MG PO TBEC
40.0000 mg | DELAYED_RELEASE_TABLET | Freq: Every day | ORAL | Status: DC
  Administered 2015-09-07 – 2015-09-08 (×2): 40 mg via ORAL
  Filled 2015-09-06 (×2): qty 1

## 2015-09-06 MED ORDER — SODIUM CHLORIDE 0.9 % IV BOLUS (SEPSIS)
500.0000 mL | Freq: Once | INTRAVENOUS | Status: AC
  Administered 2015-09-06: 500 mL via INTRAVENOUS

## 2015-09-06 MED ORDER — LISINOPRIL 20 MG PO TABS
40.0000 mg | ORAL_TABLET | Freq: Every day | ORAL | Status: DC
  Administered 2015-09-06 – 2015-09-07 (×2): 40 mg via ORAL
  Filled 2015-09-06 (×3): qty 2

## 2015-09-06 MED ORDER — SODIUM CHLORIDE 0.9% FLUSH
3.0000 mL | Freq: Two times a day (BID) | INTRAVENOUS | Status: DC
  Administered 2015-09-06 – 2015-09-08 (×3): 3 mL via INTRAVENOUS

## 2015-09-06 NOTE — ED Notes (Signed)
Report to [facility], Therapist, sports, Camera operator at [facility] ED. Pt via POV with son.

## 2015-09-06 NOTE — ED Notes (Signed)
Repeat troponin sent to lab

## 2015-09-06 NOTE — ED Notes (Signed)
Pt arrives to ER via POV from [facility]. Pt in ER last night for same and discharged. Pt continues to feel nauseated. .Pt appears fatigued and has dry heaves at time of triage. Pt unable to pass gas.

## 2015-09-06 NOTE — Consult Note (Signed)
Cardiology Consultation Note  Patient ID: Teresa Franklin, MRN: [id number], DOB/AGE: 1930-06-21 82 y.o. Admit date: 09/06/2015   Date of Consult: 09/06/2015 Primary Physician: [name], MD Primary Cardiologist: New to [facility] Requesting Physician: Dr. Benjie Karvonen, MD  Chief Complaint: Periumbilical pain Reason for Consult: NSTEMI  HPI: 82 y.o. female with h/o subdural hemorrhage 05/2013 in the setting of a fall, HTN, diverticulosis/diverticulitis, HLD, sleep apnea, and increased stress/anxiety who presented to [facility] on 0000000 with periumbilical pain and was found to have a NSTEMI with initial troponin of 8.30.   No previously known cardiac history. Never with a prior echo, stress test, or cardiac catheterization. She has been under increased stress of late with the poor health of her husband and two sisters. She was laying in her bed on the evening of 09/04/15 when she suddenly developed the need to pass gas. She did, though she had a large loss bowel movement rather than passing gas. Since that episode she has had peri-umbilical fullness/pain. Pain does not radiate. Some associated nausea, without emesis, diaphoresis, dizziness, presyncope, or syncope. Because her symtpoms persisted she presented to [facility]. She has never had any chest pain with the above.   Upon the patient's arrival to [facility] they were found to have a troponin of 8.30 at 1:58 PM, lipase negative x 2, AST 140, ALT 27, T bili 0.7, K+ 3.9, SCr 0.77, PLT 116, WBC 6.9-->10.6, hgb 14.9. ECG showed NSR, 61 bpm, baseline wandering V2-V3, nonspecific inferior st/t changes, TWI leads III, aVF, V5-V6. CT abdomen/pelvis without evidence of bowel obstruction or inflammation. There was diverticulosis of the sigmoid colon without evidence of diverticulitis. Diffuse fatty liver infiltration. Noted infiltrates vs atelectasis of the lung bases. RUQ ultrasound was negative for gallbladder disease. Abdominal plain film and CXR were negative. Echo and repeat  troponin levels are pending. Patient has received full-dose aspirin and nitro paste. She has been started on heparin gtt. Currently, without pain.  Past Medical History  Diagnosis Date  . Hypertension   . Diverticulitis   . Osteopenia   . Hypothyroidism   . Hepatic artery stenosis ([place])   . Sleep apnea   . Subdural hemorrhage ([place])   . Weakness   . Generalized anxiety disorder   . Back pain   . HLD (hyperlipidemia)   . Hepatic steatosis   . OA (osteoarthritis)       Most Recent Cardiac Studies: none   Surgical History: History reviewed. No pertinent past surgical history.   Home Meds: Prior to Admission medications   Medication Sig Start Date End Date Taking? Authorizing Provider  acetaminophen (TYLENOL) 325 MG tablet Take 650 mg by mouth every 6 (six) hours as needed for mild pain, fever or headache.    Yes Historical Provider, MD  calcium-vitamin D (OSCAL WITH D) 500-200 MG-UNIT tablet Take 1 tablet by mouth 2 (two) times daily.   Yes Historical Provider, MD  carvedilol (COREG) 12.5 MG tablet Take 12.5 mg by mouth 2 (two) times daily.   Yes Historical Provider, MD  FOLBIC 2.5-25-2 MG TABS tablet Take 1 tablet by mouth daily.   Yes Historical Provider, MD  levothyroxine (SYNTHROID, LEVOTHROID) 25 MCG tablet Take 25 mcg by mouth daily before breakfast.    Yes Historical Provider, MD  lisinopril (PRINIVIL,ZESTRIL) 40 MG tablet Take 40 mg by mouth daily.   Yes Historical Provider, MD  LORazepam (ATIVAN) 0.5 MG tablet Take 0.5 mg by mouth 2 (two) times daily as needed for anxiety.   Yes Historical  Provider, MD  Magnesium 250 MG TABS Take 250 mg by mouth daily.   Yes Historical Provider, MD  meloxicam (MOBIC) 7.5 MG tablet Take 7.5 mg by mouth daily as needed for pain.   Yes Historical Provider, MD  Multiple Vitamins-Minerals (MULTIVITAMIN GUMMIES ADULT) CHEW Chew 2 each by mouth at bedtime.   Yes Historical Provider, MD  Multiple Vitamins-Minerals (PRESERVISION AREDS 2) CAPS Take 1  capsule by mouth daily.   Yes Historical Provider, MD  omeprazole (PRILOSEC) 20 MG capsule Take 20 mg by mouth daily before breakfast.    Yes Historical Provider, MD  pravastatin (PRAVACHOL) 40 MG tablet Take 40 mg by mouth daily.    Yes Historical Provider, MD  metoCLOPramide (REGLAN) 5 MG tablet Take 1 tablet (5 mg total) by mouth every 8 (eight) hours as needed for nausea. 09/05/15 09/04/16  Anne-Caroline Mariea Clonts, MD  promethazine (PHENERGAN) 25 MG suppository Place 1 suppository (25 mg total) rectally every 6 (six) hours as needed for nausea. 09/05/15 09/04/16  Eula Listen, MD  simethicone (GAS-X) 80 MG chewable tablet Chew 1 tablet (80 mg total) by mouth 4 (four) times daily as needed for flatulence. 09/05/15 09/04/16  Eula Listen, MD    Inpatient Medications:  . heparin  3,500 Units Intravenous Once   . heparin      Allergies:  Allergies  Allergen Reactions  . Codeine Nausea And Vomiting  . Flomax [Tamsulosin] Other (See Comments)    Pt states that this medication gave her a kidney infection.    Lebron Quam [Hydrocodone-Acetaminophen] Nausea And Vomiting  . Sulfa Antibiotics Nausea And Vomiting    Social History   Social History  . Marital Status: Married    Spouse Name: N/A  . Number of Children: N/A  . Years of Education: N/A   Occupational History  . Not on file.   Social History Main Topics  . Smoking status: Never Smoker   . Smokeless tobacco: Not on file  . Alcohol Use: No  . Drug Use: No  . Sexual Activity: Not on file   Other Topics Concern  . Not on file   Social History Narrative     Family History  Problem Relation Age of Onset  . Hypertension Mother   . Hyperlipidemia Mother   . Stroke Mother   . Diabetes Sister   . Hypertension Sister   . Hyperlipidemia Sister   . Bipolar disorder Sister   . Hyperlipidemia Sister   . Stroke Sister   . Prostate cancer Brother      Review of Systems: Review of Systems  Constitutional: Positive  for weight loss and malaise/fatigue. Negative for fever, chills and diaphoresis.  HENT: Negative for congestion.   Eyes: Negative for discharge and redness.  Respiratory: Negative for cough, hemoptysis, sputum production, shortness of breath and wheezing.   Cardiovascular: Negative for chest pain, palpitations, orthopnea, claudication and PND.  Gastrointestinal: Positive for nausea, abdominal pain and diarrhea. Negative for heartburn, vomiting, constipation, blood in stool and melena.  Musculoskeletal: Negative for myalgias and falls.  Skin: Negative for rash.  Neurological: Positive for weakness. Negative for dizziness, sensory change, speech change, focal weakness and loss of consciousness.  Endo/Heme/Allergies: Does not bruise/bleed easily.  Psychiatric/Behavioral: Negative for substance abuse. The patient is nervous/anxious.   All other systems reviewed and are negative.   Labs:  Recent Labs  09/06/15 1358  TROPONINI 8.30*   Lab Results  Component Value Date   WBC 10.6 09/06/2015   HGB 14.8 09/06/2015  HCT 43.3 09/06/2015   MCV 91.0 09/06/2015   PLT 116* 09/06/2015     Recent Labs Lab 09/06/15 1245  NA 140  K 3.9  CL 104  CO2 27  BUN 14  CREATININE 0.77  CALCIUM 9.9  PROT 7.2  BILITOT 0.7  ALKPHOS 64  ALT 27  AST 140*  GLUCOSE 134*   No results found for: CHOL, HDL, LDLCALC, TRIG No results found for: DDIMER  Radiology/Studies:  Ct Abdomen Pelvis W Contrast  09/05/2015  IMPRESSION: No evidence of bowel obstruction or inflammation. Diverticulosis of the sigmoid colon without evidence of diverticulitis. Diffuse fatty infiltration of the liver. Small esophageal hiatal hernia. Infiltrates or atelectasis in the lung bases. Electronically Signed   By: Lucienne Capers M.D.   On: 09/05/2015 22:55   Dg Abd Acute W/chest  09/06/2015  IMPRESSION: No evidence of bowel obstruction or ileus. No acute cardiopulmonary disease. Electronically Signed   By: Marijo Conception,  M.D.   On: 09/06/2015 15:17   US Abdomen Limited Ruq  09/06/2015  IMPRESSION: Normal right upper quadrant ultrasound Electronically Signed   By: Lahoma Crocker M.D.   On: 09/06/2015 14:39    EKG: Interpreted by me showed: NSR, 61 bpm, baseline wandering V2-V3, nonspecific inferior st/t changes, TWI leads III, aVF, V5-V6  Weights: Filed Weights   09/06/15 1236  Weight: 126 lb (57.153 kg)     Physical Exam: Blood pressure 140/70, pulse 63, temperature 98.1 F (36.7 C), temperature source Oral, resp. rate 20, height 5\' 2"  (1.575 m), weight 126 lb (57.153 kg), SpO2 100 %. Body mass index is 23.04 kg/(m^2). General: Well developed, well nourished, in no acute distress. Head: Normocephalic, atraumatic, sclera non-icteric, no xanthomas, nares are without discharge.  Neck: Negative for carotid bruits. JVD not elevated. Lungs: Clear bilaterally to auscultation without wheezes, rales, or rhonchi. Breathing is unlabored. Heart: RRR with S1 S2. No murmurs, rubs, or gallops appreciated. Abdomen: Soft, periumbilical tenderness to palpation, non-distended with normoactive bowel sounds. No hepatomegaly. No rebound/guarding. No obvious abdominal masses. Msk:  Strength and tone appear normal for age. Extremities: No clubbing or cyanosis. No edema. Distal pedal pulses are 2+ and equal bilaterally. Neuro: Alert and oriented X 3. No facial asymmetry. No focal deficit. Moves all extremities spontaneously. Psych:  Responds to questions appropriately with a normal affect.    Assessment and Plan:  Principal Problem:   NSTEMI (non-ST elevated myocardial infarction) ([place]) Active Problems:   Subdural hemorrhage ([facility])   HLD (hyperlipidemia)   Hepatic steatosis   Hypertension    1. NSTEMI: -Atypical presentation with periumbilical pain/fullness -Never with chest pain or SOB -Currently still with periumbilical fullness, though no pain -Start heparin gtt -Scheduled for LHC with Dr. Fletcher Anon, MD on Monday,  09/09/2015 at 8:30 AM -Continue to cycle troponin until level peaks and down trends -Check echo to evaluate LVSF and wall motion -She has been under significant stress lately with the health of her husband and sisters, cannot rule out stress-induced cardiomyopathy vs ACS -Lives a very active lifestyle, swimming multiple times weekly -No previously known cardiac history -Lipid and A1C pending for further risk stratification  -Risks and benefits of cardiac catheterization have been discussed with the patient including risks of bleeding, bruising, infection, kidney damage, stroke, heart attack, and death. The patient understands these risks and is willing to proceed with the procedure. All questions have been answered and concerns listened to  2. HTN: -Controlled currently -Continue current medications  3. History of subdural  hemorrhage in 05/2013 in the setting of a fall: -Unable to use Brilinta  -Stable  4. Stress/anxiety: -Has prn Ativan at home  5. HLD: -FLP pending  6. Hypothyroidism: -On replacement therapy    Signed, Marcille Blanco [place] Pager: [phone] 09/06/2015, 4:12 PM

## 2015-09-06 NOTE — ED Notes (Signed)
Transported to [place].

## 2015-09-06 NOTE — H&P (Addendum)
[facility] [place] NAME: Teresa Franklin    MR#:  [id number]  DATE OF BIRTH:  August 23, 1930  DATE OF ADMISSION:  09/06/2015  PRIMARY CARE PHYSICIAN: [name], MD   REQUESTING/REFERRING PHYSICIAN: Dr Marcelene Butte  CHIEF COMPLAINT:   Nausea HISTORY OF PRESENT ILLNESS:  Teresa Franklin  is a 82 y.o. female with a known history of Essential hypertension who presents with nausea. Patient is seen in emergency room yesterday for nausea and abdominal pain. She underwent CT scan which did not show evidence of acute etiology. She was sent home. She returns today with persistent nausea. She denies chest pain, shortness of breath, dyspnea exertion or vomiting. She is a fairly active woman. [place] 2 [place]. She  PAST MEDICAL HISTORY:   Past Medical History  Diagnosis Date  . Hypertension   . Diverticulitis   . Osteopenia   . Hypothyroidism   . Hepatic artery stenosis ([place])   . Sleep apnea   . Subdural hematoma ([place])   . Weakness   . Generalized anxiety disorder   . Back pain     PAST SURGICAL HISTORY:  History reviewed. No pertinent past surgical history.  SOCIAL HISTORY:   Social History  Substance Use Topics  . Smoking status: Never Smoker   . Smokeless tobacco: Not on file  . Alcohol Use: No    FAMILY HISTORY:  No family history on file.  DRUG ALLERGIES:   Allergies  Allergen Reactions  . Codeine Nausea And Vomiting  . Flomax [Tamsulosin] Other (See Comments)    Pt states that this medication gave her a kidney infection.    Lebron Quam [Hydrocodone-Acetaminophen] Nausea And Vomiting  . Sulfa Antibiotics Nausea And Vomiting    REVIEW OF SYSTEMS:   Review of Systems  Constitutional: Negative for fever, chills and malaise/fatigue.  HENT: Negative for ear discharge, ear pain, hearing loss, nosebleeds and sore throat.   Eyes: Negative for blurred vision and pain.  Respiratory: Negative for cough, hemoptysis,  shortness of breath and wheezing.   Cardiovascular: Negative for chest pain, palpitations and leg swelling.  Gastrointestinal: Positive for nausea. Negative for vomiting, abdominal pain, diarrhea and blood in stool.  Genitourinary: Negative for dysuria.  Musculoskeletal: Negative for back pain.  Neurological: Negative for dizziness, tremors, speech change, focal weakness, seizures and headaches.  Endo/Heme/Allergies: Does not bruise/bleed easily.  Psychiatric/Behavioral: Negative for depression, suicidal ideas and hallucinations.    MEDICATIONS AT HOME:   Prior to Admission medications   Medication Sig Start Date End Date Taking? Authorizing Provider  acetaminophen (TYLENOL) 325 MG tablet Take 650 mg by mouth every 6 (six) hours as needed for mild pain, fever or headache.    Yes Historical Provider, MD  calcium-vitamin D (OSCAL WITH D) 500-200 MG-UNIT tablet Take 1 tablet by mouth 2 (two) times daily.   Yes Historical Provider, MD  carvedilol (COREG) 12.5 MG tablet Take 12.5 mg by mouth 2 (two) times daily.   Yes Historical Provider, MD  FOLBIC 2.5-25-2 MG TABS tablet Take 1 tablet by mouth daily.   Yes Historical Provider, MD  levothyroxine (SYNTHROID, LEVOTHROID) 25 MCG tablet Take 25 mcg by mouth daily before breakfast.    Yes Historical Provider, MD  lisinopril (PRINIVIL,ZESTRIL) 40 MG tablet Take 40 mg by mouth daily.   Yes Historical Provider, MD  LORazepam (ATIVAN) 0.5 MG tablet Take 0.5 mg by mouth 2 (two) times daily as needed for anxiety.   Yes  Historical Provider, MD  Magnesium 250 MG TABS Take 250 mg by mouth daily.   Yes Historical Provider, MD  meloxicam (MOBIC) 7.5 MG tablet Take 7.5 mg by mouth daily as needed for pain.   Yes Historical Provider, MD  Multiple Vitamins-Minerals (MULTIVITAMIN GUMMIES ADULT) CHEW Chew 2 each by mouth at bedtime.   Yes Historical Provider, MD  Multiple Vitamins-Minerals (PRESERVISION AREDS 2) CAPS Take 1 capsule by mouth daily.   Yes Historical  Provider, MD  omeprazole (PRILOSEC) 20 MG capsule Take 20 mg by mouth daily before breakfast.    Yes Historical Provider, MD  pravastatin (PRAVACHOL) 40 MG tablet Take 40 mg by mouth daily.    Yes Historical Provider, MD  metoCLOPramide (REGLAN) 5 MG tablet Take 1 tablet (5 mg total) by mouth every 8 (eight) hours as needed for nausea. 09/05/15 09/04/16  Anne-Caroline Mariea Clonts, MD  promethazine (PHENERGAN) 25 MG suppository Place 1 suppository (25 mg total) rectally every 6 (six) hours as needed for nausea. 09/05/15 09/04/16  Eula Listen, MD  simethicone (GAS-X) 80 MG chewable tablet Chew 1 tablet (80 mg total) by mouth 4 (four) times daily as needed for flatulence. 09/05/15 09/04/16  Eula Listen, MD      VITAL SIGNS:  Blood pressure 140/70, pulse 63, temperature 98.1 F (36.7 C), temperature source Oral, resp. rate 20, height 5\' 2"  (1.575 m), weight 57.153 kg (126 lb), SpO2 100 %.  PHYSICAL EXAMINATION:   Physical Exam  Constitutional: She is oriented to person, place, and time and well-developed, well-nourished, and in no distress. No distress.  HENT:  Head: Normocephalic.  Eyes: No scleral icterus.  Neck: Normal range of motion. Neck supple. No JVD present. No tracheal deviation present.  Cardiovascular: Normal rate, regular rhythm and normal heart sounds.  Exam reveals no gallop and no friction rub.   No murmur heard. Pulmonary/Chest: Effort normal and breath sounds normal. No respiratory distress. She has no wheezes. She has no rales. She exhibits no tenderness.  Abdominal: Soft. Bowel sounds are normal. She exhibits no distension and no mass. There is no tenderness. There is no rebound and no guarding.  Musculoskeletal: Normal range of motion. She exhibits no edema.  Neurological: She is alert and oriented to person, place, and time.  Skin: Skin is warm. No rash noted. No erythema.  Psychiatric: Affect and judgment normal.      LABORATORY PANEL:   CBC  Recent  Labs Lab 09/06/15 1245  WBC 10.6  HGB 14.8  HCT 43.3  PLT 116*   ------------------------------------------------------------------------------------------------------------------  Chemistries   Recent Labs Lab 09/06/15 1245  NA 140  K 3.9  CL 104  CO2 27  GLUCOSE 134*  BUN 14  CREATININE 0.77  CALCIUM 9.9  AST 140*  ALT 27  ALKPHOS 64  BILITOT 0.7   ------------------------------------------------------------------------------------------------------------------  Cardiac Enzymes  Recent Labs Lab 09/06/15 1358  TROPONINI 8.30*   ------------------------------------------------------------------------------------------------------------------  RADIOLOGY:  Ct Abdomen Pelvis W Contrast  09/05/2015  CLINICAL DATA:  Mid abdominal and periumbilical pain with nausea, vomiting, and diarrhea. EXAM: CT ABDOMEN AND PELVIS WITH CONTRAST TECHNIQUE: Multidetector CT imaging of the abdomen and pelvis was performed using the standard protocol following bolus administration of intravenous contrast. CONTRAST:  172mL ISOVUE-300 IOPAMIDOL (ISOVUE-300) INJECTION 61% COMPARISON:  None. FINDINGS: Atelectasis or infiltration in the lung bases. Small esophageal hiatal hernia. Diffuse fatty infiltration of the liver. Sub cm focal spleen lesions are nonspecific though probably represent cysts or hemangiomas. Gallbladder, pancreas, adrenal glands, kidneys, inferior vena cava,  and retroperitoneal lymph nodes are unremarkable. Calcification of abdominal aorta and branch vessels. No aneurysm. Stomach, small bowel, and colon are not abnormally distended. No free air or free fluid in the abdomen. Pelvis: The appendix is not identified. Uterus and ovaries are not enlarged. Bladder wall is not thickened. Diverticulosis and muscular hypertrophy of the sigmoid colon. No inflammatory changes to suggest diverticulitis. No free or loculated pelvic fluid collections. No pelvic mass or lymphadenopathy. Degenerative  changes and scoliosis of the lumbar spine. Prior right hip arthroplasty. IMPRESSION: No evidence of bowel obstruction or inflammation. Diverticulosis of the sigmoid colon without evidence of diverticulitis. Diffuse fatty infiltration of the liver. Small esophageal hiatal hernia. Infiltrates or atelectasis in the lung bases. Electronically Signed   By: Lucienne Capers M.D.   On: 09/05/2015 22:55   Dg Abd Acute W/chest  09/06/2015  CLINICAL DATA:  Nausea, vomiting, diarrhea. EXAM: DG ABDOMEN ACUTE W/ 1V CHEST COMPARISON:  Chest radiograph of August 24, 2013. FINDINGS: There is no evidence of dilated bowel loops or free intraperitoneal air. No radiopaque calculi or other significant radiographic abnormality is seen. Heart size and mediastinal contours are within normal limits. Both lungs are clear. IMPRESSION: No evidence of bowel obstruction or ileus. No acute cardiopulmonary disease. Electronically Signed   By: Marijo Conception, M.D.   On: 09/06/2015 15:17   US Abdomen Limited Ruq  09/06/2015  CLINICAL DATA:  Periumbilical pain for 2 hours EXAM: US ABDOMEN LIMITED - RIGHT UPPER QUADRANT COMPARISON:  CT scan 09/05/2015 FINDINGS: Gallbladder: No gallstones or wall thickening visualized. No sonographic Murphy sign noted by sonographer. Common bile duct: Diameter: 4 mm in diameter within normal limits. Liver: No focal lesion identified. Within normal limits in parenchymal echogenicity. IMPRESSION: Normal right upper quadrant ultrasound Electronically Signed   By: Lahoma Crocker M.D.   On: 09/06/2015 14:39    EKG:   Normal sinus rhythm no ST elevation or depression  IMPRESSION AND PLAN:   82 year female with history of hypertension who presents with nausea and found to have non-ST elevation MI.  1. Non-ST elevation MI: She has been under a great amount of stress which may have triggered her AMI. Patient has consented for heparin which has been started. Start aspirin. Continue statin and Coreg. Nitroglycerin  when necessary I have very spoken with Dr Fletcher Anon who will see patient today. Patient will need cardiac catheterization on Monday2 Continue to monitor troponins. Continue telemetry. Order echocardiogram.  2. Essential hypertension: Continue Coreg and lisinopril. 3. Hypothyroid: Continue Synthroid. 4. Hyperlipidemia: Check lipid panel continue statin.  All the records are reviewed and case discussed with ED provider. Management plans discussed with the patient and she is in agreement  CODE STATUS: FULL  TOTAL TIME TAKING CARE OF THIS PATIENT: 55 minutes.   D/w dr Kaylyn Layer M.D on 09/06/2015 at 3:59 PM  Between 7am to 6pm - Pager - [phone]  After 6pm go to [email or web address] - password [email or web address] [facility]  Office  [phone]  CC: Primary care physician; [name], MD

## 2015-09-06 NOTE — Consult Note (Signed)
ANTICOAGULATION CONSULT NOTE - Initial Consult  Pharmacy Consult for Heparin Indication: chest pain/ACS  Allergies  Allergen Reactions  . Codeine Nausea And Vomiting  . Flomax [Tamsulosin] Other (See Comments)    Pt states that this medication gave her a kidney infection.    Lebron Quam [Hydrocodone-Acetaminophen] Nausea And Vomiting  . Sulfa Antibiotics Nausea And Vomiting    Patient Measurements: Height: 5\' 2"  (157.5 cm) Weight: 126 lb (57.153 kg) IBW/kg (Calculated) : 50.1 Heparin Dosing Weight: 57.2 kg  Vital Signs: Temp: 98.1 F (36.7 C) (06/16 1236) Temp Source: Oral (06/16 1236) BP: 140/70 mmHg (06/16 1530) Pulse Rate: 63 (06/16 1530)  Labs:  Recent Labs  09/05/15 2058 09/06/15 1245 09/06/15 1358  HGB 14.9 14.8  --   HCT 43.5 43.3  --   PLT 116* 116*  --   CREATININE 0.68 0.77  --   TROPONINI  --   --  8.30*    Estimated Creatinine Clearance: 42.9 mL/min (by C-G formula based on Cr of 0.77).   Medical History: Past Medical History  Diagnosis Date  . Hypertension   . Diverticulitis   . Osteopenia   . Hypothyroidism   . Hepatic artery stenosis ([place])   . Sleep apnea   . Subdural hemorrhage ([place])   . Weakness   . Generalized anxiety disorder   . Back pain   . HLD (hyperlipidemia)   . Hepatic steatosis   . OA (osteoarthritis)     Medications:  Scheduled:  . heparin  3,500 Units Intravenous Once    Assessment: DF is an 82 yo female presenting with nausea and abdominal pain, found to have elevated troponins. Pharmacy consulted to manage heparin in this patient.   A review of PTA meds found no anticoagulation.  [place]elin PT/INR and aPTT ordered.  Goal of Therapy:  Heparin level 0.3-0.7 units/ml Monitor platelets by anticoagulation protocol: Yes   Plan:  Give 3500 units bolus x 1 Start heparin infusion at 700 units/hr Check anti-Xa level in 8 hours and daily while on heparin Continue to monitor H&H and platelets   Heparin level at 0100 on  06/17, 8 hours after first dose due to age >29.  Pharmacy will continue to monitor.  Vena Rua 09/06/2015,4:15 PM

## 2015-09-06 NOTE — ED Provider Notes (Signed)
Time Seen: Approximately 12 PM  I have reviewed the triage notes  Chief Complaint: Nausea; Emesis; and Abdominal Pain   History of Present Illness: Teresa Franklin is a 82 y.o. female who presents with periumbilical abdominal pain that had started the previous morning. The patient denies any chest pain or shortness of breath. She states she still feels nauseated with no new persistent vomiting though did have some vomiting last evening. Patient had an extensive evaluation including abdominal pelvic CT with contrast which did not show any obvious surgical findings like a bowel obstruction, etc. Sensation states she still feels nauseated and is consistent about pointing to the. Umbilical area as the source of discomfort. She denies any back or flank pain. She denies any arm or jaw pain. She states she's had some small loose watery stools but no persistent diarrhea at least to this historian. Eyes any dysuria, hematuria, urinary frequency. She denies any focal weakness in either upper or lower extremities and denies any headache at this time.   Past Medical History  Diagnosis Date  . Hypertension   . Diverticulitis   . Osteopenia   . Hypothyroidism   . Hepatic artery stenosis ([place])   . Sleep apnea   . Subdural hematoma ([place])   . Weakness   . Generalized anxiety disorder   . Back pain     There are no active problems to display for this patient.   History reviewed. No pertinent past surgical history.  History reviewed. No pertinent past surgical history.  Current Outpatient Rx  Name  Route  Sig  Dispense  Refill  . acetaminophen (TYLENOL) 325 MG tablet   Oral   Take 650 mg by mouth every 6 (six) hours as needed for mild pain.         . calcium-vitamin D (OSCAL WITH D) 500-200 MG-UNIT tablet   Oral   Take 1 tablet by mouth 2 (two) times daily.         . carvedilol (COREG) 12.5 MG tablet   Oral   Take 12.5 mg by mouth 2 (two) times daily.         . FOLBIC 2.5-25-2 MG  TABS tablet   Oral   Take 1 tablet by mouth daily.           Dispense as written.   Marland Kitchen levothyroxine (SYNTHROID, LEVOTHROID) 25 MCG tablet   Oral   Take 25 mcg by mouth daily.         Marland Kitchen lisinopril (PRINIVIL,ZESTRIL) 40 MG tablet   Oral   Take 40 mg by mouth daily.         Marland Kitchen LORazepam (ATIVAN) 0.5 MG tablet   Oral   Take 0.5 mg by mouth 2 (two) times daily as needed for anxiety.         . meloxicam (MOBIC) 7.5 MG tablet   Oral   Take 7.5 mg by mouth daily as needed for pain.         Marland Kitchen metoCLOPramide (REGLAN) 5 MG tablet   Oral   Take 1 tablet (5 mg total) by mouth every 8 (eight) hours as needed for nausea.   15 tablet   0   . omeprazole (PRILOSEC) 20 MG capsule   Oral   Take 20 mg by mouth daily.         . pravastatin (PRAVACHOL) 40 MG tablet   Oral   Take 40 mg by mouth every evening.         Marland Kitchen  promethazine (PHENERGAN) 25 MG suppository   Rectal   Place 1 suppository (25 mg total) rectally every 6 (six) hours as needed for nausea.   12 suppository   0   . simethicone (GAS-X) 80 MG chewable tablet   Oral   Chew 1 tablet (80 mg total) by mouth 4 (four) times daily as needed for flatulence.   20 tablet   0     Allergies:  Codeine; Flomax; Norco; and Sulfa antibiotics  Family History: No family history on file.  Social History: Social History  Substance Use Topics  . Smoking status: Never Smoker   . Smokeless tobacco: None  . Alcohol Use: No     Review of Systems:   10 point review of systems was performed and was otherwise negative:  Constitutional: No fever Eyes: No visual disturbances ENT: No sore throat, ear pain Cardiac: No chest pain Respiratory: No shortness of breath, wheezing, or stridor Abdomen: No abdominal pain, no vomiting, No diarrhea Endocrine: No weight loss, No night sweats Extremities: No peripheral edema, cyanosis Skin: No rashes, easy bruising Neurologic: No focal weakness, trouble with speech or  swollowing Urologic: No dysuria, Hematuria, or urinary frequency   Physical Exam:  ED Triage Vitals  Enc Vitals Group     BP 09/06/15 1236 102/76 mmHg     Pulse Rate 09/06/15 1236 56     Resp 09/06/15 1236 16     Temp 09/06/15 1236 98.1 F (36.7 C)     Temp Source 09/06/15 1236 Oral     SpO2 09/06/15 1236 100 %     Weight 09/06/15 1236 126 lb (57.153 kg)     Height 09/06/15 1236 5\' 2"  (1.575 m)     Head Cir --      Peak Flow --      Pain Score 09/06/15 1238 4     Pain Loc --      Pain Edu? --      Excl. in [place]? --     General: Awake , Alert , and Oriented times 3; GCS 15. His uncomfortable Head: Normal cephalic , atraumatic Eyes: Pupils equal , round, reactive to light Nose/Throat: No nasal drainage, patent upper airway without erythema or exudate.  Neck: Supple, Full range of motion, No anterior adenopathy or palpable thyroid masses Lungs: Clear to ascultation without wheezes , rhonchi, or rales Heart: Regular rate, regular rhythm without murmurs , gallops , or rubs Abdomen: Soft, non tender without rebound, guarding , or rigidity; bowel sounds positive and symmetric in all 4 quadrants. No organomegaly .        Extremities: 2 plus symmetric pulses. No edema, clubbing or cyanosis Neurologic: normal ambulation, Motor symmetric without deficits, sensory intact Skin: warm, dry, no rashes   Labs:   All laboratory work was reviewed including any pertinent negatives or positives listed below:  Labs Reviewed  COMPREHENSIVE METABOLIC PANEL - Abnormal; Notable for the following:    Glucose, Bld 134 (*)    AST 140 (*)    All other components within normal limits  CBC - Abnormal; Notable for the following:    Platelets 116 (*)    All other components within normal limits  TROPONIN I - Abnormal; Notable for the following:    Troponin I 8.30 (*)    All other components within normal limits  LIPASE, BLOOD  URINALYSIS COMPLETEWITH MICROSCOPIC ([facility] ONLY)  Review of laboratory  work shows a significantly elevated troponin level  EKG: *  ED ECG REPORT  I, Daymon Larsen, the attending physician, personally viewed and interpreted this ECG.  Date: 09/06/2015 EKG Time: 1326 Rate: 61 Rhythm: normal sinus rhythm QRS Axis: normal Intervals: normal ST/T Wave abnormalities: Diffuse nonspecific ST-T wave abnormalities Conduction Disturbances: none Narrative Interpretation: unremarkable No obvious acute ischemic changes   Radiology:  DG Abd Acute W/Chest (Final result) Result time: 09/06/15 15:17:44   Final result by Rad Results In Interface (09/06/15 15:17:44)   Narrative:   CLINICAL DATA: Nausea, vomiting, diarrhea.  EXAM: DG ABDOMEN ACUTE W/ 1V CHEST  COMPARISON: Chest radiograph of August 24, 2013.  FINDINGS: There is no evidence of dilated bowel loops or free intraperitoneal air. No radiopaque calculi or other significant radiographic abnormality is seen. Heart size and mediastinal contours are within normal limits. Both lungs are clear.  IMPRESSION: No evidence of bowel obstruction or ileus. No acute cardiopulmonary disease.   Electronically Signed By: Marijo Conception, M.D. On: 09/06/2015 15:17          US Abdomen Limited RUQ (Final result) Result time: 09/06/15 14:39:51   Final result by Rad Results In Interface (09/06/15 14:39:51)   Narrative:   CLINICAL DATA: Periumbilical pain for 2 hours  EXAM: US ABDOMEN LIMITED - RIGHT UPPER QUADRANT  COMPARISON: CT scan 09/05/2015  FINDINGS: Gallbladder:  No gallstones or wall thickening visualized. No sonographic Murphy sign noted by sonographer.  Common bile duct:  Diameter: 4 mm in diameter within normal limits.  Liver:  No focal lesion identified. Within normal limits in parenchymal echogenicity.  IMPRESSION: Normal right upper quadrant ultrasound   Electronically Signed By: Lahoma Crocker M.D. On: 09/06/2015 14:39      * I personally reviewed the  radiologic studies    Critical Care: * CRITICAL CARE Performed by: Daymon Larsen   Total critical care time: 33 minutes  Critical care time was exclusive of separately billable procedures and treating other patients.  Critical care was necessary to treat or prevent imminent or life-threatening deterioration.  Critical care was time spent personally by me on the following activities: development of treatment plan with patient and/or surrogate as well as nursing, discussions with consultants, evaluation of patient's response to treatment, examination of patient, obtaining history from patient or surrogate, ordering and performing treatments and interventions, ordering and review of laboratory studies, ordering and review of radiographic studies, pulse oximetry and re-evaluation of patient's condition. Initial evaluation and treatment for unspecified abdominal pain that's nonreproducible with findings consistent with a non-ST wave elevated myocardial infarction    ED Course: Patient's stay here was uneventful. Upon return of her troponin the patient was started on aspirin and nitroglycerin. The patient's presentation is certainly atypical at best with have periumbilical abdominal pain and some nausea and previous vomiting. The patient's troponin is significantly elevated and likely be repeated with cardiology consultation. Patient does not appear to have any signs of a bowel obstruction per x-ray and CAT scan evaluation. Both upper quadrant ultrasound was performed for completion of evaluation of the periumbilical pain which is also negative. Patient's case was reviewed with the hospitalist team, further disposition and management depends upon their evaluation    Assessment: * Acute non-ST wave elevation myocardial infarction   Final Clinical Impression:  Final diagnoses:  Periumbilical abdominal pain  Non-ST elevation (NSTEMI) myocardial infarction [facility])     Plan:  Inpatient  management           Daymon Larsen, MD 09/06/15 1525

## 2015-09-06 NOTE — ED Notes (Signed)
Pt received shot of Reglan PTA at [facility].

## 2015-09-06 NOTE — ED Notes (Signed)
Pt given warm blankets and call bell, son at bedside.

## 2015-09-06 NOTE — ED Provider Notes (Signed)
CSN: [id number]     Arrival date & time 09/06/15  1042 History   First MD Initiated Contact with Patient 09/06/15 1119     Chief Complaint  Patient presents with  . Abdominal Pain  . Nausea   (Consider location/radiation/quality/duration/timing/severity/associated sxs/prior Treatment) HPI: Patient presents today with request for IM Reglan. Patient was seen at the ER last night with epigastric pain/periumbilical pain with nausea and one episode of diarrhea. Patient states that she still has the same symptoms. She denies them being any worse than before. She denies any chest pain or shortness of breath. Patient did have labs done at the ER and also an EKG was documented to have been normal. I am unable to see the EKG and Epic. Patient was given Reglan and Toradol in the ER which patient states did help her symptoms some. She has not picked up her medications that were prescribed from the ER yet. Patient does take a PPI. She denies any known history of an ulcer or gastritis. She states that she feels like she can't pass gas. She denies any urinary symptoms or any upper respiratory symptoms. A CT scan was done which did not show any obstruction or acute abdominal process. Atelectasis/infiltrate was mentioned in the CT scan report. Patient however remains afebrile and CBC did not show an elevated white blood cell count. Patient states that she did eat oatmeal this morning however feels nauseous getting worse after eating.  Past Medical History  Diagnosis Date  . Hypertension   . Diverticulitis   . Osteopenia   . Hypothyroidism   . Hepatic artery stenosis ([place])   . Sleep apnea   . Subdural hematoma ([place])   . Weakness   . Generalized anxiety disorder   . Back pain    History reviewed. No pertinent past surgical history. History reviewed. No pertinent family history. Social History  Substance Use Topics  . Smoking status: Never Smoker   . Smokeless tobacco: None  . Alcohol Use: No   OB  History    No data available     Review of Systems: Negative except mentioned above.  Allergies  Codeine; Flomax; Norco; and Sulfa antibiotics  Home Medications   Prior to Admission medications   Medication Sig Start Date End Date Taking? Authorizing Provider  acetaminophen (TYLENOL) 325 MG tablet Take 650 mg by mouth every 6 (six) hours as needed for mild pain.    Historical Provider, MD  calcium-vitamin D (OSCAL WITH D) 500-200 MG-UNIT tablet Take 1 tablet by mouth 2 (two) times daily.    Historical Provider, MD  carvedilol (COREG) 12.5 MG tablet Take 12.5 mg by mouth 2 (two) times daily.    Historical Provider, MD  FOLBIC 2.5-25-2 MG TABS tablet Take 1 tablet by mouth daily.    Historical Provider, MD  levothyroxine (SYNTHROID, LEVOTHROID) 25 MCG tablet Take 25 mcg by mouth daily.    Historical Provider, MD  lisinopril (PRINIVIL,ZESTRIL) 40 MG tablet Take 40 mg by mouth daily.    Historical Provider, MD  LORazepam (ATIVAN) 0.5 MG tablet Take 0.5 mg by mouth 2 (two) times daily as needed for anxiety.    Historical Provider, MD  meloxicam (MOBIC) 7.5 MG tablet Take 7.5 mg by mouth daily as needed for pain.    Historical Provider, MD  metoCLOPramide (REGLAN) 5 MG tablet Take 1 tablet (5 mg total) by mouth every 8 (eight) hours as needed for nausea. 09/05/15 09/04/16  Anne-Caroline Mariea Clonts, MD  omeprazole (PRILOSEC) 20 MG capsule  Take 20 mg by mouth daily.    Historical Provider, MD  pravastatin (PRAVACHOL) 40 MG tablet Take 40 mg by mouth every evening.    Historical Provider, MD  promethazine (PHENERGAN) 25 MG suppository Place 1 suppository (25 mg total) rectally every 6 (six) hours as needed for nausea. 09/05/15 09/04/16  Eula Listen, MD  simethicone (GAS-X) 80 MG chewable tablet Chew 1 tablet (80 mg total) by mouth 4 (four) times daily as needed for flatulence. 09/05/15 09/04/16  Eula Listen, MD   Meds Ordered and Administered this Visit   Medications  metoCLOPramide  (REGLAN) injection 10 mg (10 mg Intravenous Given 09/06/15 1117)    BP 125/100 mmHg  Pulse 58  Temp(Src) 97.7 F (36.5 C) (Oral)  Resp 16  Ht 5\' 2"  (1.575 m)  Wt 126 lb (57.153 kg)  BMI 23.04 kg/m2  SpO2 98% No data found.   Physical Exam   GENERAL: sitting in wheelchair  HEENT: no pharyngeal erythema, no exudate RESP: CTA B CARD: RRR ABD: +BS, soft, mild epigastric tenderness, no rebound or guarding, no flank tenderness NEURO: CN II-XII grossly intact   ED Course  Procedures (including critical care time)  Labs Review Labs Reviewed - No data to display  Imaging Review Ct Abdomen Pelvis W Contrast  09/05/2015  CLINICAL DATA:  Mid abdominal and periumbilical pain with nausea, vomiting, and diarrhea. EXAM: CT ABDOMEN AND PELVIS WITH CONTRAST TECHNIQUE: Multidetector CT imaging of the abdomen and pelvis was performed using the standard protocol following bolus administration of intravenous contrast. CONTRAST:  141mL ISOVUE-300 IOPAMIDOL (ISOVUE-300) INJECTION 61% COMPARISON:  None. FINDINGS: Atelectasis or infiltration in the lung bases. Small esophageal hiatal hernia. Diffuse fatty infiltration of the liver. Sub cm focal spleen lesions are nonspecific though probably represent cysts or hemangiomas. Gallbladder, pancreas, adrenal glands, kidneys, inferior vena cava, and retroperitoneal lymph nodes are unremarkable. Calcification of abdominal aorta and branch vessels. No aneurysm. Stomach, small bowel, and colon are not abnormally distended. No free air or free fluid in the abdomen. Pelvis: The appendix is not identified. Uterus and ovaries are not enlarged. Bladder wall is not thickened. Diverticulosis and muscular hypertrophy of the sigmoid colon. No inflammatory changes to suggest diverticulitis. No free or loculated pelvic fluid collections. No pelvic mass or lymphadenopathy. Degenerative changes and scoliosis of the lumbar spine. Prior right hip arthroplasty. IMPRESSION: No evidence  of bowel obstruction or inflammation. Diverticulosis of the sigmoid colon without evidence of diverticulitis. Diffuse fatty infiltration of the liver. Small esophageal hiatal hernia. Infiltrates or atelectasis in the lung bases. Electronically Signed   By: Lucienne Capers M.D.   On: 09/05/2015 22:55     MDM   A/P: Intractable nausea, periumbilical pain- patient was given Reglan 10 mg IM in the office as per her request, her symptoms did not improve much after at least 30 minutes. Unsure as to the etiology of the symptoms. Patient could have viral illness causing her symptoms. GI evaluation/referral may be needed. Labs were reviewed that were done in the ER as well as CT abdomen/pelvis results. Patient still feels the inability to be able to pass gas. She did not vomit here in the office but still complained of nausea stating that she just does not feel well. I do not feel that her symptoms are cardiac in nature however a troponin would be helpful and can be drawn in the ER to confirm this. The ER was called and informed that the patient would be coming. Her son will take her  to the ER now. Vitals were stable upon transfer.   Paulina Fusi, MD 09/06/15 1214

## 2015-09-06 NOTE — Progress Notes (Signed)
A & O. Pt reports that she is very weak. 2 L of oxygen. NSR. Pt son at the bedside. Tele vertified. Pt has no further concerns at this time.

## 2015-09-06 NOTE — Progress Notes (Signed)
Trop 10.87 was called to MD Vianne Bulls. No further orders at this time.

## 2015-09-06 NOTE — ED Notes (Signed)
Pt seen at [facility] ED last night and dx with periumbilical pain and N/V/D. Pt here today with same complaints.

## 2015-09-07 ENCOUNTER — Inpatient Hospital Stay (HOSPITAL_COMMUNITY)
Admit: 2015-09-07 | Discharge: 2015-09-07 | Disposition: A | Discharge status: Home / Self Care | Payer: Medicare Other | Attending: Internal Medicine | Admitting: Internal Medicine

## 2015-09-07 DIAGNOSIS — I214 Non-ST elevation (NSTEMI) myocardial infarction: Secondary | ICD-10-CM | POA: Diagnosis present

## 2015-09-07 DIAGNOSIS — I213 ST elevation (STEMI) myocardial infarction of unspecified site: Secondary | ICD-10-CM

## 2015-09-07 LAB — BASIC METABOLIC PANEL
ANION GAP: 6 (ref 5–15)
BUN: 16 mg/dL (ref 6–20)
CALCIUM: 8.6 mg/dL — AB (ref 8.9–10.3)
CHLORIDE: 107 mmol/L (ref 101–111)
CO2: 24 mmol/L (ref 22–32)
CREATININE: 0.71 mg/dL (ref 0.44–1.00)
GFR calc non Af Amer: >60 mL/min (ref >60)
GLUCOSE: 133 mg/dL — AB (ref 65–99)
Potassium: 3.8 mmol/L (ref 3.5–5.1)
Sodium: 137 mmol/L (ref 135–145)

## 2015-09-07 LAB — ECHOCARDIOGRAM COMPLETE
AOASC: 33 cm
AV pk vel: 179 cm/s
AVPG: 13 mmHg
E decel time: 257 msec
FS: 28 % (ref 28–44)
Height: 62 in
IVS/LV PW RATIO, ED: 0.91
LA ID, A-P, ES: 41 mm
LA diam index: 2.61 cm/m2
LA vol A4C: 58.6 ml
LA vol index: 39.3 mL/m2
LAVOL: 61.7 mL
LDCA: 3.14 cm2
LEFT ATRIUM END SYS DIAM: 41 mm
LVOTD: 20 mm
MV Dec: 257
MV pk A vel: 67.9 m/s
MV pk E vel: 88.8 m/s
MVPG: 3 mmHg
PW: 11 mm — AB (ref 0.6–1.1)
TVMG: 292 mmHg
Weight: 2012.8 oz

## 2015-09-07 LAB — CBC
HCT: 35.8 % (ref 35.0–47.0)
HEMOGLOBIN: 12.4 g/dL (ref 12.0–16.0)
MCH: 31.3 pg (ref 26.0–34.0)
MCHC: 34.7 g/dL (ref 32.0–36.0)
MCV: 90.1 fL (ref 80.0–100.0)
Platelets: 75 10*3/uL — ABNORMAL LOW (ref 150–440)
RBC: 3.97 MIL/uL (ref 3.80–5.20)
RDW: 13.9 % (ref 11.5–14.5)
WBC: 7.8 10*3/uL (ref 3.6–11.0)

## 2015-09-07 LAB — LIPID PANEL
CHOL/HDL RATIO: 3.5 ratio
CHOLESTEROL: 150 mg/dL (ref 0–200)
HDL: 43 mg/dL (ref >40)
LDL CALC: 80 mg/dL (ref 0–99)
Triglycerides: 136 mg/dL (ref <150)
VLDL: 27 mg/dL (ref 0–40)

## 2015-09-07 LAB — HEPARIN LEVEL (UNFRACTIONATED)
HEPARIN UNFRACTIONATED: 0.54 [IU]/mL (ref 0.30–0.70)
Heparin Unfractionated: 0.54 IU/mL (ref 0.30–0.70)

## 2015-09-07 LAB — HEMOGLOBIN A1C: Hgb A1c MFr Bld: 5.4 % (ref 4.0–6.0)

## 2015-09-07 LAB — TROPONIN I: Troponin I: 14.97 ng/mL — ABNORMAL HIGH (ref <0.031)

## 2015-09-07 MED ORDER — POLYVINYL ALCOHOL 1.4 % OP SOLN
2.0000 [drp] | Freq: Four times a day (QID) | OPHTHALMIC | Status: DC | PRN
  Administered 2015-09-07: 2 [drp] via OPHTHALMIC
  Filled 2015-09-07: qty 15

## 2015-09-07 MED ORDER — IPRATROPIUM-ALBUTEROL 0.5-2.5 (3) MG/3ML IN SOLN
3.0000 mL | RESPIRATORY_TRACT | Status: DC | PRN
  Administered 2015-09-07 – 2015-09-08 (×3): 3 mL via RESPIRATORY_TRACT
  Filled 2015-09-07 (×2): qty 3

## 2015-09-07 MED ORDER — IPRATROPIUM-ALBUTEROL 0.5-2.5 (3) MG/3ML IN SOLN
RESPIRATORY_TRACT | Status: AC
  Administered 2015-09-07: 3 mL via RESPIRATORY_TRACT
  Filled 2015-09-07: qty 3

## 2015-09-07 MED ORDER — BUDESONIDE 0.25 MG/2ML IN SUSP
0.2500 mg | Freq: Two times a day (BID) | RESPIRATORY_TRACT | Status: DC
  Administered 2015-09-07 – 2015-09-08 (×2): 0.25 mg via RESPIRATORY_TRACT
  Filled 2015-09-07 (×2): qty 2

## 2015-09-07 NOTE — Progress Notes (Signed)
Patient complaining of shortness of breath. O2 sat 96% on 2L, but wheezing. Dr. Lavetta Nielsen notified. Orders for duonebs q4h PRN for wheezing and shortness of breath. Will administer and continue to monitor.

## 2015-09-07 NOTE — Progress Notes (Signed)
[place] [facility] [place] NAME: Teresa Franklin    MRN#:  [id number]  DATE OF BIRTH:  Nov 08, 1930  SUBJECTIVE:  Hospital Day: 1 day Teresa Franklin is a 82 y.o. female presenting with Nausea; Emesis; and Abdominal Pain .   Overnight events: No overnight events Interval Events: Nausea status improved, denies chest pain  REVIEW OF SYSTEMS:  CONSTITUTIONAL: No fever, fatigue or weakness.  EYES: No blurred or double vision.  EARS, NOSE, AND THROAT: No tinnitus or ear pain.  RESPIRATORY: No cough, shortness of breath, wheezing or hemoptysis.  CARDIOVASCULAR: No chest pain, orthopnea, edema.  GASTROINTESTINAL: No nausea, vomiting, diarrhea or abdominal pain.  GENITOURINARY: No dysuria, hematuria.  ENDOCRINE: No polyuria, nocturia,  HEMATOLOGY: No anemia, easy bruising or bleeding SKIN: No rash or lesion. MUSCULOSKELETAL: No joint pain or arthritis.   NEUROLOGIC: No tingling, numbness, weakness.  PSYCHIATRY: No anxiety or depression.   DRUG ALLERGIES:   Allergies  Allergen Reactions  . Codeine Nausea And Vomiting  . Flomax [Tamsulosin] Other (See Comments)    Pt states that this medication gave her a kidney infection.    Lebron Quam [Hydrocodone-Acetaminophen] Nausea And Vomiting  . Sulfa Antibiotics Nausea And Vomiting    VITALS:  Blood pressure 119/59, pulse 71, temperature 98.7 F (37.1 C), temperature source Oral, resp. rate 18, height 5\' 2"  (1.575 m), weight 125 lb 12.8 oz (57.063 kg), SpO2 97 %.  PHYSICAL EXAMINATION:  VITAL SIGNS: Filed Vitals:   09/07/15 1029 09/07/15 1135  BP: 119/66 119/59  Pulse: 66 71  Temp:  98.7 F (37.1 C)  Resp:  72   GENERAL:82 y.o.female currently in no acute distress.  HEAD: Normocephalic, atraumatic.  EYES: Pupils equal, round, reactive to light. Extraocular muscles intact. No scleral icterus.  MOUTH: Moist mucosal membrane. Dentition intact. No abscess noted.  EAR, NOSE, THROAT: Clear without  exudates. No external lesions.  NECK: Supple. No thyromegaly. No nodules. No JVD.  PULMONARY: Clear to ascultation, without wheeze rails or rhonci. No use of accessory muscles, Good respiratory effort. good air entry bilaterally CHEST: Nontender to palpation.  CARDIOVASCULAR: S1 and S2. Regular rate and rhythm. No murmurs, rubs, or gallops. No edema. Pedal pulses 2+ bilaterally.  GASTROINTESTINAL: Soft, nontender, nondistended. No masses. Positive bowel sounds. No hepatosplenomegaly.  MUSCULOSKELETAL: No swelling, clubbing, or edema. Range of motion full in all extremities.  NEUROLOGIC: Cranial nerves II through XII are intact. No gross focal neurological deficits. Sensation intact. Reflexes intact.  SKIN: No ulceration, lesions, rashes, or cyanosis. Skin warm and dry. Turgor intact.  PSYCHIATRIC: Mood, affect within normal limits. The patient is awake, alert and oriented x 3. Insight, judgment intact.      LABORATORY PANEL:   CBC  Recent Labs Lab 09/07/15 0522  WBC 7.8  HGB 12.4  HCT 35.8  PLT 75*   ------------------------------------------------------------------------------------------------------------------  Chemistries   Recent Labs Lab 09/06/15 1245 09/07/15 0522  NA 140 137  K 3.9 3.8  CL 104 107  CO2 27 24  GLUCOSE 134* 133*  BUN 14 16  CREATININE 0.77 0.71  CALCIUM 9.9 8.6*  AST 140*  --   ALT 27  --   ALKPHOS 64  --   BILITOT 0.7  --    ------------------------------------------------------------------------------------------------------------------  Cardiac Enzymes  Recent Labs Lab 09/07/15 0522  TROPONINI 14.97*   ------------------------------------------------------------------------------------------------------------------  RADIOLOGY:  Ct Abdomen Pelvis W Contrast  09/05/2015  CLINICAL DATA:  Mid abdominal and periumbilical pain with nausea, vomiting, and  diarrhea. EXAM: CT ABDOMEN AND PELVIS WITH CONTRAST TECHNIQUE: Multidetector CT  imaging of the abdomen and pelvis was performed using the standard protocol following bolus administration of intravenous contrast. CONTRAST:  167mL ISOVUE-300 IOPAMIDOL (ISOVUE-300) INJECTION 61% COMPARISON:  None. FINDINGS: Atelectasis or infiltration in the lung bases. Small esophageal hiatal hernia. Diffuse fatty infiltration of the liver. Sub cm focal spleen lesions are nonspecific though probably represent cysts or hemangiomas. Gallbladder, pancreas, adrenal glands, kidneys, inferior vena cava, and retroperitoneal lymph nodes are unremarkable. Calcification of abdominal aorta and branch vessels. No aneurysm. Stomach, small bowel, and colon are not abnormally distended. No free air or free fluid in the abdomen. Pelvis: The appendix is not identified. Uterus and ovaries are not enlarged. Bladder wall is not thickened. Diverticulosis and muscular hypertrophy of the sigmoid colon. No inflammatory changes to suggest diverticulitis. No free or loculated pelvic fluid collections. No pelvic mass or lymphadenopathy. Degenerative changes and scoliosis of the lumbar spine. Prior right hip arthroplasty. IMPRESSION: No evidence of bowel obstruction or inflammation. Diverticulosis of the sigmoid colon without evidence of diverticulitis. Diffuse fatty infiltration of the liver. Small esophageal hiatal hernia. Infiltrates or atelectasis in the lung bases. Electronically Signed   By: Lucienne Capers M.D.   On: 09/05/2015 22:55   Dg Abd Acute W/chest  09/06/2015  CLINICAL DATA:  Nausea, vomiting, diarrhea. EXAM: DG ABDOMEN ACUTE W/ 1V CHEST COMPARISON:  Chest radiograph of August 24, 2013. FINDINGS: There is no evidence of dilated bowel loops or free intraperitoneal air. No radiopaque calculi or other significant radiographic abnormality is seen. Heart size and mediastinal contours are within normal limits. Both lungs are clear. IMPRESSION: No evidence of bowel obstruction or ileus. No acute cardiopulmonary disease.  Electronically Signed   By: Marijo Conception, M.D.   On: 09/06/2015 15:17   US Abdomen Limited Ruq  09/06/2015  CLINICAL DATA:  Periumbilical pain for 2 hours EXAM: US ABDOMEN LIMITED - RIGHT UPPER QUADRANT COMPARISON:  CT scan 09/05/2015 FINDINGS: Gallbladder: No gallstones or wall thickening visualized. No sonographic Murphy sign noted by sonographer. Common bile duct: Diameter: 4 mm in diameter within normal limits. Liver: No focal lesion identified. Within normal limits in parenchymal echogenicity. IMPRESSION: Normal right upper quadrant ultrasound Electronically Signed   By: Lahoma Crocker M.D.   On: 09/06/2015 14:39    EKG:   Orders placed or performed during the hospital encounter of 09/06/15  . EKG 12-Lead  . EKG 12-Lead    ASSESSMENT AND PLAN:   Teresa Franklin is a 82 y.o. female presenting with Nausea; Emesis; and Abdominal Pain . Admitted 09/06/2015 : Day #: 1 day 1. Non-ST elevation MI: Aspirin, statin, heparin, cardiology input appreciated  2. Essential hypertension: Continue Coreg and lisinopril. 3. Unspecified Hypothyroid: Continue Synthroid. 4. Unspecified Hyperlipidemia:  statin.   All the records are reviewed and case discussed with Care Management/Social Workerr. Management plans discussed with the patient, family and they are in agreement.  CODE STATUS: full TOTAL TIME TAKING CARE OF THIS PATIENT: 28 minutes.   POSSIBLE D/C IN 1-2DAYS, DEPENDING ON CLINICAL CONDITION.   Teresa Franklin,  Karenann Cai.D on 09/07/2015 at 12:03 PM  Between 7am to 6pm - Pager - [phone]  After 6pm: House Pager: - [facility]  Office  [phone]  CC: Primary care physician; [name], MD

## 2015-09-07 NOTE — Consult Note (Signed)
ANTICOAGULATION CONSULT NOTE - Initial Consult  Pharmacy Consult for Heparin Indication: chest pain/ACS  Allergies  Allergen Reactions  . Codeine Nausea And Vomiting  . Flomax [Tamsulosin] Other (See Comments)    Pt states that this medication gave her a kidney infection.    Lebron Quam [Hydrocodone-Acetaminophen] Nausea And Vomiting  . Sulfa Antibiotics Nausea And Vomiting    Patient Measurements: Height: 5\' 2"  (157.5 cm) Weight: 125 lb 12.8 oz (57.063 kg) IBW/kg (Calculated) : 50.1 Heparin Dosing Weight: 57.2 kg  Vital Signs: Temp: 98.3 F (36.8 C) (06/17 0428) BP: 119/66 mmHg (06/17 1029) Pulse Rate: 66 (06/17 1029)  Labs:  Recent Labs  09/05/15 2058 09/06/15 1245  09/06/15 1803 09/06/15 2216 09/07/15 0107 09/07/15 0522 09/07/15 0823  HGB 14.9 14.8  --   --   --   --  12.4  --   HCT 43.5 43.3  --   --   --   --  35.8  --   PLT 116* 116*  --   --   --   --  75*  --   APTT  --   --   --  148*  --   --   --   --   LABPROT  --   --   --  15.2*  --   --   --   --   INR  --   --   --  1.18  --   --   --   --   HEPARINUNFRC  --   --   --   --   --  0.54  --  0.54  CREATININE 0.68 0.77  --   --   --   --  0.71  --   TROPONINI  --   --   < > 10.87* 10.67*  --  14.97*  --   < > = values in this interval not displayed.  Estimated Creatinine Clearance: 42.9 mL/min (by C-G formula based on Cr of 0.71).   Medical History: Past Medical History  Diagnosis Date  . Hypertension   . Diverticulitis   . Osteopenia   . Hypothyroidism   . Hepatic artery stenosis ([place])   . Sleep apnea   . Subdural hemorrhage ([place])   . Weakness   . Generalized anxiety disorder   . Back pain   . HLD (hyperlipidemia)   . Hepatic steatosis   . OA (osteoarthritis)     Medications:  Scheduled:  . aspirin  81 mg Oral Daily  . calcium-vitamin D  1 tablet Oral BID  . carvedilol  12.5 mg Oral BID  . folic acid  2.5 mg Oral Daily  . levothyroxine  25 mcg Oral QAC breakfast  . lisinopril  40 mg  Oral Daily  . magnesium oxide  400 mg Oral Daily  . multivitamin with minerals  1 tablet Oral QHS  . multivitamin-lutein  2 capsule Oral Daily  . pantoprazole  40 mg Oral Daily  . pravastatin  40 mg Oral Daily  . pyridOXINE  25 mg Oral Daily  . sodium chloride flush  3 mL Intravenous Q12H  . vitamin B-12  2,000 mcg Oral Daily    Assessment: Teresa Franklin is an 82 yo female presenting with nausea and abdominal pain, found to have elevated troponins. Pharmacy consulted to manage heparin in this patient.   A review of PTA meds found no anticoagulation.  Baselin PT/INR and aPTT ordered.  Goal of Therapy:  Heparin level  0.3-0.7 units/ml Monitor platelets by anticoagulation protocol: Yes   Plan:  Current orders for heparin 700 units/hr. Repeat heparin level therapeutic. Will recheck HL and CBC with AM labs  Pharmacy will continue to monitor.  Jyren Cerasoli C 09/07/2015,10:33 AM

## 2015-09-07 NOTE — Progress Notes (Signed)
Patient has made it very clear to this RN that she does not want to be resuscitated. Daughter, POA, at bedside said patient does not have a DNR form, but that she will bring in healthcare POA forms. Updated Dr. Lavetta Nielsen. MD to place orders.

## 2015-09-07 NOTE — Progress Notes (Signed)
Text page sent to Dr. Lavetta Nielsen with results of fourth troponin. Patient already on heparin gtt and cardiology consult in.

## 2015-09-07 NOTE — Progress Notes (Signed)
Dr. Lavetta Nielsen rounding on unit now. Confirmed that he received text page regarding most recent troponin. Patient still without chest pain. No new orders at this time. Will continue to monitor.

## 2015-09-07 NOTE — Progress Notes (Signed)
*  PRELIMINARY RESULTS* Echocardiogram 2D Echocardiogram has been performed.  Teresa Franklin 09/07/2015, 10:12 AM

## 2015-09-07 NOTE — Consult Note (Signed)
ANTICOAGULATION CONSULT NOTE - Initial Consult  Pharmacy Consult for Heparin Indication: chest pain/ACS  Allergies  Allergen Reactions  . Codeine Nausea And Vomiting  . Flomax [Tamsulosin] Other (See Comments)    Pt states that this medication gave her a kidney infection.    Lebron Quam [Hydrocodone-Acetaminophen] Nausea And Vomiting  . Sulfa Antibiotics Nausea And Vomiting    Patient Measurements: Height: 5\' 2"  (157.5 cm) Weight: 125 lb 12.8 oz (57.063 kg) IBW/kg (Calculated) : 50.1 Heparin Dosing Weight: 57.2 kg  Vital Signs: Temp: 99 F (37.2 C) (06/16 1953) Temp Source: Oral (06/16 1953) BP: 129/59 mmHg (06/16 1953) Pulse Rate: 64 (06/16 1953)  Labs:  Recent Labs  09/05/15 2058 09/06/15 1245 09/06/15 1358 09/06/15 1803 09/06/15 2216 09/07/15 0107  HGB 14.9 14.8  --   --   --   --   HCT 43.5 43.3  --   --   --   --   PLT 116* 116*  --   --   --   --   APTT  --   --   --  148*  --   --   LABPROT  --   --   --  15.2*  --   --   INR  --   --   --  1.18  --   --   HEPARINUNFRC  --   --   --   --   --  0.54  CREATININE 0.68 0.77  --   --   --   --   TROPONINI  --   --  8.30* 10.87* 10.67*  --     Estimated Creatinine Clearance: 42.9 mL/min (by C-G formula based on Cr of 0.77).   Medical History: Past Medical History  Diagnosis Date  . Hypertension   . Diverticulitis   . Osteopenia   . Hypothyroidism   . Hepatic artery stenosis ([place])   . Sleep apnea   . Subdural hemorrhage ([place])   . Weakness   . Generalized anxiety disorder   . Back pain   . HLD (hyperlipidemia)   . Hepatic steatosis   . OA (osteoarthritis)     Medications:  Scheduled:  . aspirin  81 mg Oral Daily  . calcium-vitamin D  1 tablet Oral BID  . carvedilol  12.5 mg Oral BID  . folic acid  2.5 mg Oral Daily  . levothyroxine  25 mcg Oral QAC breakfast  . lisinopril  40 mg Oral Daily  . magnesium oxide  400 mg Oral Daily  . multivitamin with minerals  1 tablet Oral QHS  .  multivitamin-lutein  2 capsule Oral Daily  . pantoprazole  40 mg Oral Daily  . pravastatin  40 mg Oral Daily  . pyridOXINE  25 mg Oral Daily  . sodium chloride flush  3 mL Intravenous Q12H  . vitamin B-12  2,000 mcg Oral Daily    Assessment: DF is an 82 yo female presenting with nausea and abdominal pain, found to have elevated troponins. Pharmacy consulted to manage heparin in this patient.   A review of PTA meds found no anticoagulation.  Baselin PT/INR and aPTT ordered.  Goal of Therapy:  Heparin level 0.3-0.7 units/ml Monitor platelets by anticoagulation protocol: Yes   Plan:  Give 3500 units bolus x 1 Start heparin infusion at 700 units/hr Check anti-Xa level in 8 hours and daily while on heparin Continue to monitor H&H and platelets   Heparin level at 0100 on 06/17, 8  hours after first dose due to age >33.  6/17 01:00 heparin level 0.54. Recheck in 8 hours to confirm.  Pharmacy will continue to monitor.  Alica Shellhammer S 09/07/2015,3:34 AM

## 2015-09-07 NOTE — Progress Notes (Signed)
Patient is having an expiratory wheezes all lobes on auscultation and she's  requesting for eye drop for dry eye. Dr. Verdell Carmine notified with a new order for artificial tears as needed 4 times a day and Pulmicort two times a day. Will follow  through as ordered.

## 2015-09-07 NOTE — Progress Notes (Signed)
Dr. Rockey Situ rounding on unit now. Updated regarding patient's troponins, no chest pain or nausea at this time, just generalized weakness. Planning for cath on Monday. No new orders at this time. Will continue to monitor.

## 2015-09-08 ENCOUNTER — Inpatient Hospital Stay (HOSPITAL_COMMUNITY)
Admission: EM | Admit: 2015-09-08 | Discharge: 2015-09-18 | DRG: 246 | Disposition: A | Discharge status: Skilled Nursing Facility | Payer: Medicare Other | Source: Ambulatory Visit | Attending: Internal Medicine | Admitting: Internal Medicine

## 2015-09-08 ENCOUNTER — Encounter (HOSPITAL_COMMUNITY): Payer: Self-pay | Admitting: *Deleted

## 2015-09-08 ENCOUNTER — Ambulatory Visit (HOSPITAL_COMMUNITY): Payer: Medicare Other | Admitting: Anesthesiology

## 2015-09-08 ENCOUNTER — Inpatient Hospital Stay (HOSPITAL_COMMUNITY): Payer: Medicare Other

## 2015-09-08 ENCOUNTER — Other Ambulatory Visit: Payer: Self-pay

## 2015-09-08 ENCOUNTER — Encounter
Admission: EM | Disposition: A | Discharge status: Other Acute Inpatient Hospital | Payer: Self-pay | Source: Home / Self Care | Attending: Internal Medicine

## 2015-09-08 ENCOUNTER — Ambulatory Visit (HOSPITAL_COMMUNITY): Admit: 2015-09-08 | Payer: Self-pay | Admitting: Cardiology

## 2015-09-08 DIAGNOSIS — R570 Cardiogenic shock: Secondary | ICD-10-CM | POA: Diagnosis present

## 2015-09-08 DIAGNOSIS — I2584 Coronary atherosclerosis due to calcified coronary lesion: Secondary | ICD-10-CM | POA: Diagnosis present

## 2015-09-08 DIAGNOSIS — N390 Urinary tract infection, site not specified: Secondary | ICD-10-CM

## 2015-09-08 DIAGNOSIS — D6489 Other specified anemias: Secondary | ICD-10-CM | POA: Diagnosis present

## 2015-09-08 DIAGNOSIS — Z8673 Personal history of transient ischemic attack (TIA), and cerebral infarction without residual deficits: Secondary | ICD-10-CM

## 2015-09-08 DIAGNOSIS — D696 Thrombocytopenia, unspecified: Secondary | ICD-10-CM | POA: Diagnosis present

## 2015-09-08 DIAGNOSIS — I252 Old myocardial infarction: Secondary | ICD-10-CM

## 2015-09-08 DIAGNOSIS — I2111 ST elevation (STEMI) myocardial infarction involving right coronary artery: Secondary | ICD-10-CM

## 2015-09-08 DIAGNOSIS — W19XXXA Unspecified fall, initial encounter: Secondary | ICD-10-CM | POA: Diagnosis present

## 2015-09-08 DIAGNOSIS — I2510 Atherosclerotic heart disease of native coronary artery without angina pectoris: Secondary | ICD-10-CM | POA: Diagnosis present

## 2015-09-08 DIAGNOSIS — I639 Cerebral infarction, unspecified: Secondary | ICD-10-CM

## 2015-09-08 DIAGNOSIS — E876 Hypokalemia: Secondary | ICD-10-CM | POA: Diagnosis present

## 2015-09-08 DIAGNOSIS — E039 Hypothyroidism, unspecified: Secondary | ICD-10-CM | POA: Diagnosis present

## 2015-09-08 DIAGNOSIS — R402144 Coma scale, eyes open, spontaneous, 24 hours or more after hospital admission: Secondary | ICD-10-CM | POA: Diagnosis present

## 2015-09-08 DIAGNOSIS — I63531 Cerebral infarction due to unspecified occlusion or stenosis of right posterior cerebral artery: Secondary | ICD-10-CM | POA: Diagnosis not present

## 2015-09-08 DIAGNOSIS — Z515 Encounter for palliative care: Secondary | ICD-10-CM | POA: Diagnosis not present

## 2015-09-08 DIAGNOSIS — Z885 Allergy status to narcotic agent status: Secondary | ICD-10-CM | POA: Diagnosis not present

## 2015-09-08 DIAGNOSIS — I441 Atrioventricular block, second degree: Secondary | ICD-10-CM | POA: Diagnosis not present

## 2015-09-08 DIAGNOSIS — Z955 Presence of coronary angioplasty implant and graft: Secondary | ICD-10-CM

## 2015-09-08 DIAGNOSIS — Z66 Do not resuscitate: Secondary | ICD-10-CM | POA: Diagnosis not present

## 2015-09-08 DIAGNOSIS — R402364 Coma scale, best motor response, obeys commands, 24 hours or more after hospital admission: Secondary | ICD-10-CM | POA: Diagnosis present

## 2015-09-08 DIAGNOSIS — E872 Acidosis: Secondary | ICD-10-CM | POA: Diagnosis present

## 2015-09-08 DIAGNOSIS — I214 Non-ST elevation (NSTEMI) myocardial infarction: Principal | ICD-10-CM | POA: Diagnosis present

## 2015-09-08 DIAGNOSIS — I10 Essential (primary) hypertension: Secondary | ICD-10-CM | POA: Diagnosis not present

## 2015-09-08 DIAGNOSIS — G459 Transient cerebral ischemic attack, unspecified: Secondary | ICD-10-CM | POA: Diagnosis not present

## 2015-09-08 DIAGNOSIS — I959 Hypotension, unspecified: Secondary | ICD-10-CM | POA: Diagnosis present

## 2015-09-08 DIAGNOSIS — Z823 Family history of stroke: Secondary | ICD-10-CM | POA: Diagnosis not present

## 2015-09-08 DIAGNOSIS — J9601 Acute respiratory failure with hypoxia: Secondary | ICD-10-CM | POA: Diagnosis present

## 2015-09-08 DIAGNOSIS — R402254 Coma scale, best verbal response, oriented, 24 hours or more after hospital admission: Secondary | ICD-10-CM | POA: Diagnosis present

## 2015-09-08 DIAGNOSIS — Z9289 Personal history of other medical treatment: Secondary | ICD-10-CM

## 2015-09-08 DIAGNOSIS — J810 Acute pulmonary edema: Secondary | ICD-10-CM | POA: Diagnosis not present

## 2015-09-08 DIAGNOSIS — R0602 Shortness of breath: Secondary | ICD-10-CM | POA: Insufficient documentation

## 2015-09-08 DIAGNOSIS — R110 Nausea: Secondary | ICD-10-CM | POA: Diagnosis present

## 2015-09-08 DIAGNOSIS — J9600 Acute respiratory failure, unspecified whether with hypoxia or hypercapnia: Secondary | ICD-10-CM | POA: Diagnosis not present

## 2015-09-08 DIAGNOSIS — G9340 Encephalopathy, unspecified: Secondary | ICD-10-CM | POA: Diagnosis present

## 2015-09-08 DIAGNOSIS — Z882 Allergy status to sulfonamides status: Secondary | ICD-10-CM | POA: Diagnosis not present

## 2015-09-08 DIAGNOSIS — Z888 Allergy status to other drugs, medicaments and biological substances status: Secondary | ICD-10-CM

## 2015-09-08 DIAGNOSIS — D649 Anemia, unspecified: Secondary | ICD-10-CM | POA: Diagnosis present

## 2015-09-08 DIAGNOSIS — I442 Atrioventricular block, complete: Secondary | ICD-10-CM | POA: Diagnosis not present

## 2015-09-08 DIAGNOSIS — K5900 Constipation, unspecified: Secondary | ICD-10-CM | POA: Diagnosis not present

## 2015-09-08 DIAGNOSIS — I5041 Acute combined systolic (congestive) and diastolic (congestive) heart failure: Secondary | ICD-10-CM | POA: Diagnosis present

## 2015-09-08 DIAGNOSIS — I63511 Cerebral infarction due to unspecified occlusion or stenosis of right middle cerebral artery: Secondary | ICD-10-CM | POA: Diagnosis present

## 2015-09-08 DIAGNOSIS — I5033 Acute on chronic diastolic (congestive) heart failure: Secondary | ICD-10-CM | POA: Diagnosis not present

## 2015-09-08 DIAGNOSIS — D62 Acute posthemorrhagic anemia: Secondary | ICD-10-CM | POA: Diagnosis present

## 2015-09-08 DIAGNOSIS — G4733 Obstructive sleep apnea (adult) (pediatric): Secondary | ICD-10-CM | POA: Diagnosis present

## 2015-09-08 DIAGNOSIS — E785 Hyperlipidemia, unspecified: Secondary | ICD-10-CM | POA: Diagnosis present

## 2015-09-08 DIAGNOSIS — Z781 Physical restraint status: Secondary | ICD-10-CM

## 2015-09-08 DIAGNOSIS — I4891 Unspecified atrial fibrillation: Secondary | ICD-10-CM | POA: Diagnosis present

## 2015-09-08 DIAGNOSIS — I213 ST elevation (STEMI) myocardial infarction of unspecified site: Secondary | ICD-10-CM

## 2015-09-08 DIAGNOSIS — Z01818 Encounter for other preprocedural examination: Secondary | ICD-10-CM | POA: Diagnosis present

## 2015-09-08 DIAGNOSIS — I110 Hypertensive heart disease with heart failure: Secondary | ICD-10-CM | POA: Diagnosis present

## 2015-09-08 DIAGNOSIS — R079 Chest pain, unspecified: Secondary | ICD-10-CM | POA: Diagnosis not present

## 2015-09-08 HISTORY — PX: CARDIAC CATHETERIZATION: SHX172

## 2015-09-08 HISTORY — DX: Anxiety disorder, unspecified: F41.9

## 2015-09-08 HISTORY — DX: Atherosclerotic heart disease of native coronary artery without angina pectoris: I25.10

## 2015-09-08 LAB — POCT I-STAT 3, ART BLOOD GAS (G3+)
Acid-base deficit: 8 mmol/L — ABNORMAL HIGH (ref 0.0–2.0)
Acid-base deficit: 5 mmol/L — ABNORMAL HIGH (ref 0.0–2.0)
Bicarbonate: 18.9 mEq/L — ABNORMAL LOW (ref 20.0–24.0)
Bicarbonate: 20 mEq/L (ref 20.0–24.0)
O2 Saturation: 96 %
O2 Saturation: 90 %
PCO2 ART: 41.2 mmHg (ref 35.0–45.0)
PCO2 ART: 35.4 mmHg (ref 35.0–45.0)
PH ART: 7.359 (ref 7.350–7.450)
TCO2: 20 mmol/L (ref 0–100)
TCO2: 21 mmol/L (ref 0–100)
pH, Arterial: 7.27 — ABNORMAL LOW (ref 7.350–7.450)
pO2, Arterial: 95 mmHg (ref 80.0–100.0)
pO2, Arterial: 60 mmHg — ABNORMAL LOW (ref 80.0–100.0)

## 2015-09-08 LAB — CBC
HCT: 29.7 % — ABNORMAL LOW (ref 35.0–47.0)
HCT: 28.9 % — ABNORMAL LOW (ref 35.0–47.0)
Hemoglobin: 10.2 g/dL — ABNORMAL LOW (ref 12.0–16.0)
Hemoglobin: 9.8 g/dL — ABNORMAL LOW (ref 12.0–16.0)
MCH: 31.6 pg (ref 26.0–34.0)
MCH: 31.1 pg (ref 26.0–34.0)
MCHC: 34.5 g/dL (ref 32.0–36.0)
MCHC: 34.1 g/dL (ref 32.0–36.0)
MCV: 91.7 fL (ref 80.0–100.0)
MCV: 91.4 fL (ref 80.0–100.0)
PLATELETS: 69 10*3/uL — AB (ref 150–440)
PLATELETS: 76 10*3/uL — AB (ref 150–440)
RBC: 3.24 MIL/uL — ABNORMAL LOW (ref 3.80–5.20)
RBC: 3.16 MIL/uL — AB (ref 3.80–5.20)
RDW: 14 % (ref 11.5–14.5)
RDW: 14.3 % (ref 11.5–14.5)
WBC: 7.3 10*3/uL (ref 3.6–11.0)
WBC: 9.7 10*3/uL (ref 3.6–11.0)

## 2015-09-08 LAB — POCT ACTIVATED CLOTTING TIME
ACTIVATED CLOTTING TIME: 153 s
Activated Clotting Time: 147 seconds

## 2015-09-08 LAB — MRSA PCR SCREENING: MRSA BY PCR: NEGATIVE

## 2015-09-08 LAB — GLUCOSE, CAPILLARY: Glucose-Capillary: 126 mg/dL — ABNORMAL HIGH (ref 65–99)

## 2015-09-08 LAB — HEPARIN LEVEL (UNFRACTIONATED): HEPARIN UNFRACTIONATED: 0.31 [IU]/mL (ref 0.30–0.70)

## 2015-09-08 LAB — SURGICAL PCR SCREEN
MRSA, PCR: NEGATIVE
Staphylococcus aureus: NEGATIVE

## 2015-09-08 SURGERY — TEMPORARY PACEMAKER
Anesthesia: LOCAL

## 2015-09-08 MED ORDER — LIDOCAINE HCL (PF) 1 % IJ SOLN
INTRAMUSCULAR | Status: AC
  Filled 2015-09-08: qty 30

## 2015-09-08 MED ORDER — ONDANSETRON HCL 4 MG/2ML IJ SOLN
INTRAMUSCULAR | Status: DC | PRN
  Administered 2015-09-08: 4 mg via INTRAVENOUS

## 2015-09-08 MED ORDER — FUROSEMIDE 10 MG/ML IJ SOLN
INTRAMUSCULAR | Status: AC
  Filled 2015-09-08: qty 4

## 2015-09-08 MED ORDER — IOPAMIDOL (ISOVUE-370) INJECTION 76%
INTRAVENOUS | Status: AC
  Filled 2015-09-08: qty 100

## 2015-09-08 MED ORDER — DOPAMINE-DEXTROSE 3.2-5 MG/ML-% IV SOLN
0.0000 ug/kg/min | INTRAVENOUS | Status: DC
  Administered 2015-09-09: 20 ug/kg/min via INTRAVENOUS
  Administered 2015-09-09: 15 ug/kg/min via INTRAVENOUS
  Administered 2015-09-10: 9 ug/kg/min via INTRAVENOUS
  Filled 2015-09-08 (×3): qty 250

## 2015-09-08 MED ORDER — SODIUM CHLORIDE 0.9 % IV SOLN
INTRAVENOUS | Status: DC | PRN
  Administered 2015-09-08: 20 mL/h via INTRAVENOUS

## 2015-09-08 MED ORDER — POLYETHYLENE GLYCOL 3350 17 G PO PACK: 17.0000 g | PACK | Freq: Every day | ORAL | Status: DC | PRN

## 2015-09-08 MED ORDER — SODIUM CHLORIDE 0.9 % IV SOLN
1.0000 mg/h | INTRAVENOUS | Status: DC
  Administered 2015-09-08 – 2015-09-09 (×2): 2 mg/h via INTRAVENOUS
  Filled 2015-09-08 (×2): qty 10

## 2015-09-08 MED ORDER — ETOMIDATE 2 MG/ML IV SOLN
INTRAVENOUS | Status: DC | PRN
  Administered 2015-09-08: 16 mg via INTRAVENOUS

## 2015-09-08 MED ORDER — DOPAMINE-DEXTROSE 3.2-5 MG/ML-% IV SOLN
5.0000 ug/kg/min | INTRAVENOUS | Status: DC
Start: 2015-09-08 — End: 2015-09-08
  Administered 2015-09-08: 5 ug/kg/min via INTRAVENOUS
  Filled 2015-09-08: qty 250

## 2015-09-08 MED ORDER — ATORVASTATIN CALCIUM 80 MG PO TABS
80.0000 mg | ORAL_TABLET | Freq: Every day | ORAL | Status: DC
  Administered 2015-09-08 – 2015-09-17 (×7): 80 mg via ORAL
  Filled 2015-09-08 (×9): qty 1

## 2015-09-08 MED ORDER — ASPIRIN 81 MG PO CHEW
81.0000 mg | CHEWABLE_TABLET | Freq: Every day | ORAL | Status: DC
  Administered 2015-09-09 – 2015-09-18 (×10): 81 mg via ORAL
  Filled 2015-09-08 (×10): qty 1

## 2015-09-08 MED ORDER — MIDAZOLAM HCL 2 MG/2ML IJ SOLN
INTRAMUSCULAR | Status: DC | PRN
  Administered 2015-09-08 (×2): 1 mg via INTRAVENOUS
  Administered 2015-09-08 (×2): 2 mg via INTRAVENOUS

## 2015-09-08 MED ORDER — ALPRAZOLAM 0.25 MG PO TABS: 0.2500 mg | ORAL_TABLET | Freq: Two times a day (BID) | ORAL | Status: DC | PRN

## 2015-09-08 MED ORDER — ENOXAPARIN SODIUM 40 MG/0.4ML ~~LOC~~ SOLN
40.0000 mg | SUBCUTANEOUS | Status: DC
  Administered 2015-09-09 – 2015-09-17 (×9): 40 mg via SUBCUTANEOUS
  Filled 2015-09-08 (×9): qty 0.4

## 2015-09-08 MED ORDER — AMIODARONE LOAD VIA INFUSION
150.0000 mg | Freq: Once | INTRAVENOUS | Status: AC
  Administered 2015-09-08: 150 mg via INTRAVENOUS
  Filled 2015-09-08: qty 83.34

## 2015-09-08 MED ORDER — OCUVITE-LUTEIN PO CAPS
1.0000 | ORAL_CAPSULE | Freq: Every day | ORAL | Status: DC
  Filled 2015-09-08: qty 1

## 2015-09-08 MED ORDER — SODIUM CHLORIDE 0.9 % IV SOLN: 250.0000 mL | INTRAVENOUS | Status: DC | PRN

## 2015-09-08 MED ORDER — SODIUM CHLORIDE 0.9 % IV BOLUS (SEPSIS)
500.0000 mL | Freq: Once | INTRAVENOUS | Status: AC
  Administered 2015-09-08: 500 mL via INTRAVENOUS

## 2015-09-08 MED ORDER — SODIUM CHLORIDE 0.9 % IV SOLN
50000.0000 ug | INTRAVENOUS | Status: DC | PRN
  Administered 2015-09-08: 4 ug/kg/min via INTRAVENOUS

## 2015-09-08 MED ORDER — SODIUM CHLORIDE 0.9% FLUSH: 3.0000 mL | INTRAVENOUS | Status: DC | PRN

## 2015-09-08 MED ORDER — CLOPIDOGREL BISULFATE 300 MG PO TABS
300.0000 mg | ORAL_TABLET | Freq: Once | ORAL | Status: AC
  Administered 2015-09-08: 300 mg
  Filled 2015-09-08: qty 1

## 2015-09-08 MED ORDER — CLOPIDOGREL BISULFATE 75 MG PO TABS
75.0000 mg | ORAL_TABLET | Freq: Every day | ORAL | Status: DC
  Administered 2015-09-09 – 2015-09-18 (×10): 75 mg via ORAL
  Filled 2015-09-08 (×10): qty 1

## 2015-09-08 MED ORDER — BIVALIRUDIN 250 MG IV SOLR
INTRAVENOUS | Status: AC
  Filled 2015-09-08: qty 250

## 2015-09-08 MED ORDER — CANGRELOR BOLUS VIA INFUSION
INTRAVENOUS | Status: DC | PRN
  Administered 2015-09-08: 1713 ug via INTRAVENOUS

## 2015-09-08 MED ORDER — HEPARIN (PORCINE) IN NACL 2-0.9 UNIT/ML-% IJ SOLN
INTRAMUSCULAR | Status: AC
  Filled 2015-09-08: qty 500

## 2015-09-08 MED ORDER — IOPAMIDOL (ISOVUE-370) INJECTION 76%
INTRAVENOUS | Status: DC | PRN
  Administered 2015-09-08: 135 mL via INTRAVENOUS

## 2015-09-08 MED ORDER — AMIODARONE HCL IN DEXTROSE 360-4.14 MG/200ML-% IV SOLN
INTRAVENOUS | Status: AC
  Administered 2015-09-08: 33.3 mL
  Filled 2015-09-08: qty 200

## 2015-09-08 MED ORDER — MIDAZOLAM HCL 2 MG/2ML IJ SOLN
INTRAMUSCULAR | Status: AC
  Filled 2015-09-08: qty 2

## 2015-09-08 MED ORDER — LIDOCAINE HCL (CARDIAC) 20 MG/ML IV SOLN
INTRAVENOUS | Status: DC | PRN
  Administered 2015-09-08: 40 mg via INTRAVENOUS

## 2015-09-08 MED ORDER — SODIUM CHLORIDE 0.9 % IV SOLN: INTRAVENOUS | Status: AC

## 2015-09-08 MED ORDER — LIDOCAINE HCL (PF) 1 % IJ SOLN
INTRAMUSCULAR | Status: DC | PRN
  Administered 2015-09-08: 25 mL

## 2015-09-08 MED ORDER — CANGRELOR TETRASODIUM 50 MG IV SOLR
INTRAVENOUS | Status: AC
  Filled 2015-09-08: qty 50

## 2015-09-08 MED ORDER — FA-PYRIDOXINE-CYANOCOBALAMIN 2.5-25-2 MG PO TABS
1.0000 | ORAL_TABLET | Freq: Every day | ORAL | Status: DC
  Administered 2015-09-09 – 2015-09-18 (×10): 1 via ORAL
  Filled 2015-09-08 (×10): qty 1

## 2015-09-08 MED ORDER — IOPAMIDOL (ISOVUE-370) INJECTION 76%
INTRAVENOUS | Status: AC
  Filled 2015-09-08: qty 125

## 2015-09-08 MED ORDER — ZOLPIDEM TARTRATE 5 MG PO TABS
5.0000 mg | ORAL_TABLET | Freq: Every evening | ORAL | Status: DC | PRN
  Administered 2015-09-10 – 2015-09-16 (×3): 5 mg via ORAL
  Filled 2015-09-08 (×4): qty 1

## 2015-09-08 MED ORDER — FENTANYL CITRATE (PF) 100 MCG/2ML IJ SOLN
INTRAMUSCULAR | Status: DC | PRN
  Administered 2015-09-08 (×2): 25 ug via INTRAVENOUS

## 2015-09-08 MED ORDER — AMIODARONE HCL IN DEXTROSE 360-4.14 MG/200ML-% IV SOLN
30.0000 mg/h | INTRAVENOUS | Status: DC
  Administered 2015-09-09: 30 mg/h via INTRAVENOUS
  Filled 2015-09-08: qty 200

## 2015-09-08 MED ORDER — LEVOTHYROXINE SODIUM 25 MCG PO TABS
25.0000 ug | ORAL_TABLET | Freq: Every day | ORAL | Status: DC
  Administered 2015-09-09 – 2015-09-18 (×10): 25 ug via ORAL
  Filled 2015-09-08 (×10): qty 1

## 2015-09-08 MED ORDER — BIVALIRUDIN BOLUS VIA INFUSION - CUPID
INTRAVENOUS | Status: DC | PRN
  Administered 2015-09-08: 42.825 mg via INTRAVENOUS

## 2015-09-08 MED ORDER — MAGNESIUM OXIDE 400 (241.3 MG) MG PO TABS
200.0000 mg | ORAL_TABLET | Freq: Every day | ORAL | Status: DC
  Administered 2015-09-09 – 2015-09-18 (×10): 200 mg via ORAL
  Filled 2015-09-08 (×11): qty 1

## 2015-09-08 MED ORDER — CARVEDILOL 12.5 MG PO TABS
12.5000 mg | ORAL_TABLET | Freq: Two times a day (BID) | ORAL | Status: DC
  Administered 2015-09-08 – 2015-09-09 (×2): 12.5 mg via ORAL
  Filled 2015-09-08 (×2): qty 1

## 2015-09-08 MED ORDER — DOPAMINE-DEXTROSE 3.2-5 MG/ML-% IV SOLN
INTRAVENOUS | Status: DC | PRN
  Administered 2015-09-08: 15 ug/kg/min via INTRAVENOUS

## 2015-09-08 MED ORDER — FLEET ENEMA 7-19 GM/118ML RE ENEM: 1.0000 | ENEMA | Freq: Every day | RECTAL | Status: DC | PRN

## 2015-09-08 MED ORDER — LORAZEPAM 0.5 MG PO TABS: 0.5000 mg | ORAL_TABLET | Freq: Two times a day (BID) | ORAL | Status: DC | PRN

## 2015-09-08 MED ORDER — SODIUM CHLORIDE 0.9% FLUSH
3.0000 mL | Freq: Two times a day (BID) | INTRAVENOUS | Status: DC
  Administered 2015-09-08 – 2015-09-09 (×2): 3 mL via INTRAVENOUS

## 2015-09-08 MED ORDER — SODIUM CHLORIDE 0.9 % IV SOLN
50.0000 ug/h | INTRAVENOUS | Status: DC
  Administered 2015-09-08: 50 ug/h via INTRAVENOUS
  Filled 2015-09-08 (×2): qty 50

## 2015-09-08 MED ORDER — ATROPINE SULFATE 1 MG/10ML IJ SOSY
PREFILLED_SYRINGE | INTRAMUSCULAR | Status: AC
Start: 2015-09-08 — End: 2015-09-09
  Filled 2015-09-08: qty 10

## 2015-09-08 MED ORDER — CHLORHEXIDINE GLUCONATE 0.12% ORAL RINSE (MEDLINE KIT)
15.0000 mL | Freq: Two times a day (BID) | OROMUCOSAL | Status: DC
  Administered 2015-09-08 – 2015-09-10 (×4): 15 mL via OROMUCOSAL

## 2015-09-08 MED ORDER — NITROGLYCERIN 0.4 MG SL SUBL: 0.4000 mg | SUBLINGUAL_TABLET | SUBLINGUAL | Status: DC | PRN

## 2015-09-08 MED ORDER — FENTANYL CITRATE (PF) 100 MCG/2ML IJ SOLN
INTRAMUSCULAR | Status: AC
  Filled 2015-09-08: qty 2

## 2015-09-08 MED ORDER — ONDANSETRON HCL 4 MG/2ML IJ SOLN
INTRAMUSCULAR | Status: AC
  Filled 2015-09-08: qty 2

## 2015-09-08 MED ORDER — BIVALIRUDIN 250 MG IV SOLR
250.0000 mg | INTRAVENOUS | Status: DC | PRN
  Administered 2015-09-08: 1.75 mg/kg/h via INTRAVENOUS

## 2015-09-08 MED ORDER — AMIODARONE HCL IN DEXTROSE 360-4.14 MG/200ML-% IV SOLN
60.0000 mg/h | INTRAVENOUS | Status: AC
  Filled 2015-09-08: qty 200

## 2015-09-08 MED ORDER — ACETAMINOPHEN 325 MG PO TABS: 650.0000 mg | ORAL_TABLET | ORAL | Status: DC | PRN

## 2015-09-08 MED ORDER — DOCUSATE SODIUM 100 MG PO CAPS
100.0000 mg | ORAL_CAPSULE | Freq: Two times a day (BID) | ORAL | Status: DC
  Filled 2015-09-08: qty 1

## 2015-09-08 MED ORDER — PANTOPRAZOLE SODIUM 40 MG PO TBEC: 40.0000 mg | DELAYED_RELEASE_TABLET | Freq: Every day | ORAL | Status: DC

## 2015-09-08 MED ORDER — SUCCINYLCHOLINE CHLORIDE 20 MG/ML IJ SOLN
INTRAMUSCULAR | Status: DC | PRN
  Administered 2015-09-08: 100 mg via INTRAVENOUS

## 2015-09-08 MED ORDER — FUROSEMIDE 10 MG/ML IJ SOLN
INTRAMUSCULAR | Status: DC | PRN
  Administered 2015-09-08 (×2): 40 mg via INTRAVENOUS

## 2015-09-08 MED ORDER — HEPARIN (PORCINE) IN NACL 2-0.9 UNIT/ML-% IJ SOLN
INTRAMUSCULAR | Status: DC | PRN
  Administered 2015-09-08: 1500 mL

## 2015-09-08 MED ORDER — ONDANSETRON HCL 4 MG/2ML IJ SOLN
4.0000 mg | Freq: Four times a day (QID) | INTRAMUSCULAR | Status: DC | PRN
  Administered 2015-09-11 – 2015-09-12 (×3): 4 mg via INTRAVENOUS
  Filled 2015-09-08 (×3): qty 2

## 2015-09-08 MED ORDER — BUPIVACAINE HCL (PF) 0.25 % IJ SOLN
INTRAMUSCULAR | Status: AC
  Filled 2015-09-08: qty 30

## 2015-09-08 MED ORDER — ANTISEPTIC ORAL RINSE SOLUTION (CORINZ)
7.0000 mL | Freq: Four times a day (QID) | OROMUCOSAL | Status: DC
  Administered 2015-09-09 (×3): 7 mL via OROMUCOSAL

## 2015-09-08 SURGICAL SUPPLY — 18 items
BALLN EUPHORA RX 2.0X10 (BALLOONS) ×3
BALLOON EUPHORA RX 2.0X10 (BALLOONS) ×2 IMPLANT
CATH INFINITI 5FR MULTPACK ANG (CATHETERS) ×3 IMPLANT
CATH S G BIP PACING (SET/KITS/TRAYS/PACK) ×3 IMPLANT
ELECT DEFIB PAD ADLT CADENCE (PAD) ×3 IMPLANT
GLIDESHEATH SLEND SS 6F .021 (SHEATH) ×3 IMPLANT
GUIDE CATH RUNWAY 6FR FR4 (CATHETERS) ×3 IMPLANT
KIT ENCORE 26 ADVANTAGE (KITS) ×3 IMPLANT
PACK CARDIAC CATHETERIZATION (CUSTOM PROCEDURE TRAY) ×3 IMPLANT
PROTECTION STATION PRESSURIZED (MISCELLANEOUS) ×3
SHEATH PINNACLE 6F 10CM (SHEATH) ×6 IMPLANT
SLEEVE REPOSITIONING LENGTH 30 (MISCELLANEOUS) ×3 IMPLANT
STATION PROTECTION PRESSURIZED (MISCELLANEOUS) ×2 IMPLANT
STENT PROMUS PREM MR 2.25X12 (Permanent Stent) ×3 IMPLANT
TRANSDUCER W/STOPCOCK (MISCELLANEOUS) ×3 IMPLANT
TUBING CIL FLEX 10 FLL-RA (TUBING) ×3 IMPLANT
WIRE ASAHI PROWATER 180CM (WIRE) ×3 IMPLANT
WIRE EMERALD 3MM-J .035X150CM (WIRE) ×3 IMPLANT

## 2015-09-08 NOTE — Consult Note (Signed)
ANTICOAGULATION CONSULT NOTE - Initial Consult  Pharmacy Consult for Heparin Indication: chest pain/ACS  Allergies  Allergen Reactions  . Codeine Nausea And Vomiting  . Flomax [Tamsulosin] Other (See Comments)    Pt states that this medication gave her a kidney infection.    Lebron Quam [Hydrocodone-Acetaminophen] Nausea And Vomiting  . Sulfa Antibiotics Nausea And Vomiting    Patient Measurements: Height: 5\' 2"  (157.5 cm) Weight: 125 lb 12.8 oz (57.063 kg) IBW/kg (Calculated) : 50.1 Heparin Dosing Weight: 57.2 kg  Vital Signs: Temp: 97.8 F (36.6 C) (06/18 0421) BP: 119/61 mmHg (06/18 0421) Pulse Rate: 72 (06/18 0421)  Labs:  Recent Labs  09/05/15 2058 09/06/15 1245  09/06/15 1803 09/06/15 2216 09/07/15 0107 09/07/15 0522 09/07/15 0823 09/08/15 0611  HGB 14.9 14.8  --   --   --   --  12.4  --  10.2*  HCT 43.5 43.3  --   --   --   --  35.8  --  29.7*  PLT 116* 116*  --   --   --   --  75*  --  69*  APTT  --   --   --  148*  --   --   --   --   --   LABPROT  --   --   --  15.2*  --   --   --   --   --   INR  --   --   --  1.18  --   --   --   --   --   HEPARINUNFRC  --   --   --   --   --  0.54  --  0.54 0.31  CREATININE 0.68 0.77  --   --   --   --  0.71  --   --   TROPONINI  --   --   < > 10.87* 10.67*  --  14.97*  --   --   < > = values in this interval not displayed.  Estimated Creatinine Clearance: 42.9 mL/min (by C-G formula based on Cr of 0.71).   Medical History: Past Medical History  Diagnosis Date  . Hypertension   . Diverticulitis   . Osteopenia   . Hypothyroidism   . Hepatic artery stenosis ([place])   . Sleep apnea   . Subdural hemorrhage ([place])   . Weakness   . Generalized anxiety disorder   . Back pain   . HLD (hyperlipidemia)   . Hepatic steatosis   . OA (osteoarthritis)     Medications:  Scheduled:  . aspirin  81 mg Oral Daily  . budesonide (PULMICORT) nebulizer solution  0.25 mg Nebulization BID  . calcium-vitamin D  1 tablet Oral BID   . carvedilol  12.5 mg Oral BID  . folic acid  2.5 mg Oral Daily  . levothyroxine  25 mcg Oral QAC breakfast  . lisinopril  40 mg Oral Daily  . magnesium oxide  400 mg Oral Daily  . multivitamin with minerals  1 tablet Oral QHS  . multivitamin-lutein  2 capsule Oral Daily  . pantoprazole  40 mg Oral Daily  . pravastatin  40 mg Oral Daily  . pyridOXINE  25 mg Oral Daily  . sodium chloride flush  3 mL Intravenous Q12H  . vitamin B-12  2,000 mcg Oral Daily    Assessment: DF is an 82 yo female presenting with nausea and abdominal pain, found to have elevated  troponins. Pharmacy consulted to manage heparin in this patient.   A review of PTA meds found no anticoagulation.  [place] PT/INR and aPTT ordered.  Goal of Therapy:  Heparin level 0.3-0.7 units/ml Monitor platelets by anticoagulation protocol: Yes   Plan:  Current orders for heparin 700 units/hr. Repeat heparin level therapeutic. Will recheck HL and CBC with AM labs  6/18 AM heparin level 0.31. Continue current regimen. Recheck heparin level and CBC with tomorrow AM labs.  Pharmacy will continue to monitor.  Zaydah Nawabi S 09/08/2015,7:29 AM

## 2015-09-08 NOTE — Discharge Summary (Signed)
[facility] [place] NAME: Teresa Franklin    MR#:  [id number]  DATE OF BIRTH:  [birth year]  DATE OF ADMISSION:  09/06/2015 ADMITTING PHYSICIAN: Bettey Costa, MD  DATE OF DISCHARGE: 09/08/2015  PRIMARY CARE PHYSICIAN: [name], MD    ADMISSION DIAGNOSIS:  Non-ST elevation (NSTEMI) myocardial infarction ([place]) [id number] Periumbilical abdominal pain [R10.33] Hepatic steatosis [K76.0] HLD (hyperlipidemia) [E78.5] Essential hypertension [I10]  DISCHARGE DIAGNOSIS:  Principal Problem:   NSTEMI (non-ST elevated myocardial infarction) ([place])   Second-degree heart block with bradycardia and hypotension   HLD (hyperlipidemia)   Hepatic steatosis   Hypertension   SECONDARY DIAGNOSIS:   Past Medical History  Diagnosis Date  . Hypertension   . Diverticulitis   . Osteopenia   . Hypothyroidism   . Hepatic artery stenosis ([place])   . Sleep apnea   . Subdural hemorrhage ([place])   . Weakness   . Generalized anxiety disorder   . Back pain   . HLD (hyperlipidemia)   . Hepatic steatosis   . OA (osteoarthritis)     HOSPITAL COURSE:  Teresa Franklin  is a 82 y.o. female admitted 09/06/2015 with chief complaint Nausea; Emesis; and Abdominal Pain . Please see H&P performed by Bettey Costa, MD for further information. She originally presented to the hospital with the above symptoms. With refractory nausea she was found to have positive cardiac enzymes. She was subsequently placed on heparin drip with a cardiac evaluation. Upon admission placed on cardiac telemetry without any initial issues. Her troponin peaked at 14.9. She was scheduled for cardiac catheterization to be performed on 09/09/2015. However on 09/08/2015 she was noted to go into second-degree heart block with associated bradycardia and hypotension. She was ultimately transferred to the intensive care unit given her change in condition and required dopamine for blood pressure and heart rate support.  Given her worsening condition it was suggested she be transferred to a tertiary facility as she may require more aggressive cardiovascular therapy including electrophysiology, temporary pacemaker, formal pacemaker, urgent catheterization  He was also noted a drop in her hemoglobin from yesterday to today recheck hemoglobin pending at this time, she does complain of some swelling and pain left thigh site of previous outpatient IM injection which may require further imaging to rule out hematoma if continued change in hemoglobin level.   Echocardiogram  Left ventricle: The cavity size was normal. There was mild focal  basal hypertrophy of the septum. Systolic function was normal.  The estimated ejection fraction was in the range of 60% to 65%.  Wall motion was normal; there were no regional wall motion  abnormalities. Left ventricular diastolic function parameters  were normal. - Mitral valve: There was mild to moderate regurgitation. - Left atrium: The atrium was mildly dilated. - Right ventricle: Systolic function was normal. - Tricuspid valve: There was moderate regurgitation. - Pulmonary arteries: Systolic pressure was mild to moderately  elevated. PA peak pressure: 49 mm Hg (S).  DISCHARGE CONDITIONS:   Changing condition requiring tertiary care  CONSULTS OBTAINED:  Treatment Team:  [name], MD  DRUG ALLERGIES:   Allergies  Allergen Reactions  . Codeine Nausea And Vomiting  . Flomax [Tamsulosin] Other (See Comments)    Pt states that this medication gave her a kidney infection.    Lebron Quam [Hydrocodone-Acetaminophen] Nausea And Vomiting  . Sulfa Antibiotics Nausea And Vomiting    DISCHARGE MEDICATIONS:   Current Discharge Medication List    CONTINUE these medications  which have NOT CHANGED   Details  acetaminophen (TYLENOL) 325 MG tablet Take 650 mg by mouth every 6 (six) hours as needed for mild pain, fever or headache.     calcium-vitamin D (OSCAL WITH  D) 500-200 MG-UNIT tablet Take 1 tablet by mouth 2 (two) times daily.    carvedilol (COREG) 12.5 MG tablet Take 12.5 mg by mouth 2 (two) times daily.    FOLBIC 2.5-25-2 MG TABS tablet Take 1 tablet by mouth daily.    levothyroxine (SYNTHROID, LEVOTHROID) 25 MCG tablet Take 25 mcg by mouth daily before breakfast.     lisinopril (PRINIVIL,ZESTRIL) 40 MG tablet Take 40 mg by mouth daily.    LORazepam (ATIVAN) 0.5 MG tablet Take 0.5 mg by mouth 2 (two) times daily as needed for anxiety.    Magnesium 250 MG TABS Take 250 mg by mouth daily.    meloxicam (MOBIC) 7.5 MG tablet Take 7.5 mg by mouth daily as needed for pain.    Multiple Vitamins-Minerals (MULTIVITAMIN GUMMIES ADULT) CHEW Chew 2 each by mouth at bedtime.    Multiple Vitamins-Minerals (PRESERVISION AREDS 2) CAPS Take 1 capsule by mouth daily.    omeprazole (PRILOSEC) 20 MG capsule Take 20 mg by mouth daily before breakfast.     pravastatin (PRAVACHOL) 40 MG tablet Take 40 mg by mouth daily.     metoCLOPramide (REGLAN) 5 MG tablet Take 1 tablet (5 mg total) by mouth every 8 (eight) hours as needed for nausea. Qty: 15 tablet, Refills: 0    promethazine (PHENERGAN) 25 MG suppository Place 1 suppository (25 mg total) rectally every 6 (six) hours as needed for nausea. Qty: 12 suppository, Refills: 0    simethicone (GAS-X) 80 MG chewable tablet Chew 1 tablet (80 mg total) by mouth 4 (four) times daily as needed for flatulence. Qty: 20 tablet, Refills: 0         DISCHARGE INSTRUCTIONS:    DIET:  Cardiac diet  DISCHARGE CONDITION:  Fair  ACTIVITY:  Activity as tolerated  OXYGEN:  Home Oxygen: No.   Oxygen Delivery: room air  DISCHARGE LOCATION:  [facility]   If you experience worsening of your admission symptoms, develop shortness of breath, life threatening emergency, suicidal or homicidal thoughts you must seek medical attention immediately by calling 911 or calling your MD immediately  if symptoms less  severe.  You Must read complete instructions/literature along with all the possible adverse reactions/side effects for all the Medicines you take and that have been prescribed to you. Take any new Medicines after you have completely understood and accpet all the possible adverse reactions/side effects.   Please note  You were cared for by a hospitalist during your hospital stay. If you have any questions about your discharge medications or the care you received while you were in the hospital after you are discharged, you can call the unit and asked to speak with the hospitalist on call if the hospitalist that took care of you is not available. Once you are discharged, your primary care physician will handle any further medical issues. Please note that NO REFILLS for any discharge medications will be authorized once you are discharged, as it is imperative that you return to your primary care physician (or establish a relationship with a primary care physician if you do not have one) for your aftercare needs so that they can reassess your need for medications and monitor your lab values.    On the day of Discharge:   VITAL SIGNS:  Blood pressure 92/40, pulse 72, temperature 97.8 F (36.6 C), temperature source Oral, resp. rate 18, height 5\' 2"  (1.575 m), weight 125 lb 12.8 oz (57.063 kg), SpO2 95 %.  I/O:   Intake/Output Summary (Last 24 hours) at 09/08/15 1212 Last data filed at 09/08/15 0900  Gross per 24 hour  Intake 2431.6 ml  Output    450 ml  Net 1981.6 ml    PHYSICAL EXAMINATION:  GENERAL:  82 y.o.-year-old patient lying in the bed with Mild acute distress.  EYES: Pupils equal, round, reactive to light and accommodation. No scleral icterus. Extraocular muscles intact.  HEENT: Head atraumatic, normocephalic. Oropharynx and nasopharynx clear.  NECK:  Supple, no jugular venous distention. No thyroid enlargement, no tenderness.  LUNGS: Normal breath sounds bilaterally, no wheezing,  rales,rhonchi or crepitation. No use of accessory muscles of respiration.  CARDIOVASCULAR: S1, S2 bradycardic. No murmurs, rubs, or gallops.  ABDOMEN: Soft, non-tender, non-distended. Bowel sounds present. No organomegaly or mass.  EXTREMITIES: No pedal edema, cyanosis, or clubbing.  NEUROLOGIC: Cranial nerves II through XII are intact. Muscle strength 4/5 in all extremities. Sensation intact. Gait not checked.  PSYCHIATRIC: The patient is alert and oriented x 3.  SKIN: No obvious rash, lesion, or ulcer.   DATA REVIEW:   CBC  Recent Labs Lab 09/08/15 0611  WBC 7.3  HGB 10.2*  HCT 29.7*  PLT 69*    Chemistries   Recent Labs Lab 09/06/15 1245 09/07/15 0522  NA 140 137  K 3.9 3.8  CL 104 107  CO2 27 24  GLUCOSE 134* 133*  BUN 14 16  CREATININE 0.77 0.71  CALCIUM 9.9 8.6*  AST 140*  --   ALT 27  --   ALKPHOS 64  --   BILITOT 0.7  --     Cardiac Enzymes  Recent Labs Lab 09/07/15 0522  TROPONINI 14.97*    Microbiology Results  No results found for this or any previous visit.  RADIOLOGY:  Dg Abd Acute W/chest  09/06/2015  CLINICAL DATA:  Nausea, vomiting, diarrhea. EXAM: DG ABDOMEN ACUTE W/ 1V CHEST COMPARISON:  Chest radiograph of August 24, 2013. FINDINGS: There is no evidence of dilated bowel loops or free intraperitoneal air. No radiopaque calculi or other significant radiographic abnormality is seen. Heart size and mediastinal contours are within normal limits. Both lungs are clear. IMPRESSION: No evidence of bowel obstruction or ileus. No acute cardiopulmonary disease. Electronically Signed   By: Marijo Conception, M.D.   On: 09/06/2015 15:17   US Abdomen Limited Ruq  09/06/2015  CLINICAL DATA:  Periumbilical pain for 2 hours EXAM: US ABDOMEN LIMITED - RIGHT UPPER QUADRANT COMPARISON:  CT scan 09/05/2015 FINDINGS: Gallbladder: No gallstones or wall thickening visualized. No sonographic Murphy sign noted by sonographer. Common bile duct: Diameter: 4 mm in diameter  within normal limits. Liver: No focal lesion identified. Within normal limits in parenchymal echogenicity. IMPRESSION: Normal right upper quadrant ultrasound Electronically Signed   By: Lahoma Crocker M.D.   On: 09/06/2015 14:39     Management plans discussed with the patient, family and they are in agreement.  CODE STATUS:     Code Status Orders        Start     Ordered   09/07/15 1643  Do not attempt resuscitation (DNR)   Continuous    Question Answer Comment  In the event of cardiac or respiratory ARREST Do not call a "code blue"   In the event of cardiac or respiratory  ARREST Do not perform Intubation, CPR, defibrillation or ACLS   In the event of cardiac or respiratory ARREST Use medication by any route, position, wound care, and other measures to relive pain and suffering. May use oxygen, suction and manual treatment of airway obstruction as needed for comfort.      09/07/15 1642    Code Status History    Date Active Date Inactive Code Status Order ID Comments User Context   09/06/2015  4:26 PM 09/07/2015  4:42 PM Full Code [id number]  Bettey Costa, MD ED      TOTAL TIME TAKING CARE OF THIS PATIENT: 50 minutes.    Hower,  Karenann Cai.D on 09/08/2015 at 12:12 PM  Between 7am to 6pm - Pager - [phone]  After 6pm go to [email or web address] - Proofreader  [facility]  Office  [phone]  CC: Primary care physician; [name], MD

## 2015-09-08 NOTE — Anesthesia Postprocedure Evaluation (Signed)
Anesthesia Post Note  Patient: Teresa Franklin  Procedure(s) Performed: * No procedures listed *  Patient location during evaluation: Cath Lab Level of consciousness: sedated Pain management: pain level controlled Vital Signs Assessment: post-procedure vital signs reviewed and stable Respiratory status: patient remains intubated per anesthesia plan and patient on ventilator - see flowsheet for VS Cardiovascular status: blood pressure returned to baseline Anesthetic complications: no    Last Vitals:  Filed Vitals:   09/08/15 1455  BP: 153/61  Pulse: 72  Resp: 16    Last Pain: There were no vitals filed for this visit.               Jeris Roser A

## 2015-09-08 NOTE — Progress Notes (Signed)
[facility] Physician-Brief Progress Note Patient Name: Teresa Franklin DOB: 1930/11/27 MRN: [id number]   Date of Service  09/08/2015  HPI/Events of Note  Acute resp failure developed during cath with clinical evidence of pulmonary edema so intubated by anesthesia and transferred to CCU  eICU Interventions  PCXR, abg's vent orders written, will continue to monitor      Intervention Category Major Interventions: Respiratory failure - evaluation and management  Christinia Gully 09/08/2015, 5:36 PM

## 2015-09-08 NOTE — Progress Notes (Signed)
Right femoral sheaths pulled at 2145. Idelia Salm RN and Carin Primrose RN at bedside with emergency equipment. Vital signs stable during sheath pull and pressure held for 20 mins. Pressure dressing applied, will continue to monitor pt.

## 2015-09-08 NOTE — Progress Notes (Signed)
Pt came from room 246 with decreased heart rate and BP. Pt noted to be in second degree, type 1, heart block at a rate of 48. Initial BP 88/43, pt noted to have a nitroglycerin patch on the left shoulder. Nitro patch removed and pt started on a dopamine drip per Dr. Rockey Situ. Pt set to be transferred to [facility] cath lab. Report given to [facility] via phone. Upon arrival of [facility], pt care released to [place], [facility] and [place]. [facility] staff moved pt to their stretcher and assumed pt care. DNR paperwork released to [place] staff.

## 2015-09-08 NOTE — Progress Notes (Signed)
Patient HR dropped into the 40s, patient is 2nd degree AV block. BP 88/43. Dr. Lavetta Nielsen notified. Per MD give 500cc bolus. Dr. Rockey Situ at bedside. Per Dr. Rockey Situ order CBC stat. Lab notified. Hold heparin due to possible bleed and transfer patient to unit. Patient is complaining of leg pain and nausea. zofran IV given. Report given to accepting RN. Patient transferred to the unit.

## 2015-09-08 NOTE — Progress Notes (Signed)
Patient: Teresa Franklin / Admit Date: 09/06/2015 / Date of Encounter: 09/08/2015, 10:52 AM   Subjective: This morning on rounds, noted to be more lethargic, little bit more confused per the family Rubbing her left leg, reported it was uncomfortable Son reports chronic back pain, perhaps worse from being in bed for the past 3 days Plan is for cardiac catheterization on Monday for non-STEMI  Review of telemetry shows heart rate in the low 40s, new second-degree AV block starting this morning Hypotensive with systolic pressure in the 123XX123 Small fluid bolus given with no improvement of her blood pressure, Transfer order placed to transfer to ICU The above was discussed with family Started on dopamine for heart rate and blood pressure support, 5 g  Lab work with drop in her blood count I recommended that we hold the heparin  repeat CBC , hematocrit down to 28, yesterday was 36  Calls placed to [facility] for transfer  Review of Systems: Review of Systems  Constitutional:       Lethargic, appears uncomfortable, mild back pain, left leg pain  Respiratory: Negative.   Cardiovascular: Negative.   Gastrointestinal: Negative.   Musculoskeletal: Negative.        Left leg pain  Neurological: Negative.   Psychiatric/Behavioral: Negative.   All other systems reviewed and are negative.    Objective: Telemetry: Second-degree AV block type I with rate in the low 40s Physical Exam: Blood pressure 119/61, pulse 72, temperature 97.8 F (36.6 C), temperature source Oral, resp. rate 18, height 5\' 2"  (1.575 m), weight 125 lb 12.8 oz (57.063 kg), SpO2 95 %. Body mass index is 23 kg/(m^2). General:  acute distress appears mildly agitated,  Head: Normocephalic, atraumatic, sclera non-icteric, no xanthomas, nares are without discharge. Neck: Negative for carotid bruits. JVP not elevated. Lungs: Clear bilaterally to auscultation without wheezes, rales, or rhonchi. Breathing is unlabored. Heart:  RRR S1 S2 without murmurs, rubs, or gallops, bradycardia.  Abdomen: Soft, non-tender, non-distended with normoactive bowel sounds. No rebound/guarding. Extremities: No clubbing or cyanosis. No edema. Distal pedal pulses are 2+ and equal bilaterally. Neuro: Lethargic, moves all extremities spontaneously, but able to answer questions   Psych:  less alert, unable to converse   Intake/Output Summary (Last 24 hours) at 09/08/15 1052 Last data filed at 09/08/15 0900  Gross per 24 hour  Intake 2431.6 ml  Output    450 ml  Net 1981.6 ml    Inpatient Medications:  . aspirin  81 mg Oral Daily  . budesonide (PULMICORT) nebulizer solution  0.25 mg Nebulization BID  . calcium-vitamin D  1 tablet Oral BID  . carvedilol  12.5 mg Oral BID  . docusate sodium  100 mg Oral BID  . folic acid  2.5 mg Oral Daily  . levothyroxine  25 mcg Oral QAC breakfast  . lisinopril  40 mg Oral Daily  . magnesium oxide  400 mg Oral Daily  . multivitamin with minerals  1 tablet Oral QHS  . multivitamin-lutein  2 capsule Oral Daily  . pantoprazole  40 mg Oral Daily  . pravastatin  40 mg Oral Daily  . pyridOXINE  25 mg Oral Daily  . sodium chloride flush  3 mL Intravenous Q12H  . vitamin B-12  2,000 mcg Oral Daily   Infusions:  . heparin 700 Units/hr (09/07/15 2359)    Labs:  Recent Labs  09/06/15 1245 09/07/15 0522  NA 140 137  K 3.9 3.8  CL 104 107  CO2  27 24  GLUCOSE 134* 133*  BUN 14 16  CREATININE 0.77 0.71  CALCIUM 9.9 8.6*    Recent Labs  09/05/15 2058 09/06/15 1245  AST 58* 140*  ALT 18 27  ALKPHOS 64 64  BILITOT 0.8 0.7  PROT 7.0 7.2  ALBUMIN 4.3 4.5    Recent Labs  09/07/15 0522 09/08/15 0611  WBC 7.8 7.3  HGB 12.4 10.2*  HCT 35.8 29.7*  MCV 90.1 91.7  PLT 75* 69*    Recent Labs  09/06/15 1358 09/06/15 1803 09/06/15 2216 09/07/15 0522  TROPONINI 8.30* 10.87* 10.67* 14.97*   Invalid input(s): POCBNP  Recent Labs  09/06/15 1803  HGBA1C 5.4      Weights: Filed Weights   09/06/15 1236 09/06/15 1717  Weight: 126 lb (57.153 kg) 125 lb 12.8 oz (57.063 kg)     Radiology/Studies:  Ct Abdomen Pelvis W Contrast  09/05/2015  CLINICAL DATA:  Mid abdominal and periumbilical pain with nausea, vomiting, and diarrhea. EXAM: CT ABDOMEN AND PELVIS WITH CONTRAST TECHNIQUE: Multidetector CT imaging of the abdomen and pelvis was performed using the standard protocol following bolus administration of intravenous contrast. CONTRAST:  190mL ISOVUE-300 IOPAMIDOL (ISOVUE-300) INJECTION 61% COMPARISON:  None. FINDINGS: Atelectasis or infiltration in the lung bases. Small esophageal hiatal hernia. Diffuse fatty infiltration of the liver. Sub cm focal spleen lesions are nonspecific though probably represent cysts or hemangiomas. Gallbladder, pancreas, adrenal glands, kidneys, inferior vena cava, and retroperitoneal lymph nodes are unremarkable. Calcification of abdominal aorta and branch vessels. No aneurysm. Stomach, small bowel, and colon are not abnormally distended. No free air or free fluid in the abdomen. Pelvis: The appendix is not identified. Uterus and ovaries are not enlarged. Bladder wall is not thickened. Diverticulosis and muscular hypertrophy of the sigmoid colon. No inflammatory changes to suggest diverticulitis. No free or loculated pelvic fluid collections. No pelvic mass or lymphadenopathy. Degenerative changes and scoliosis of the lumbar spine. Prior right hip arthroplasty. IMPRESSION: No evidence of bowel obstruction or inflammation. Diverticulosis of the sigmoid colon without evidence of diverticulitis. Diffuse fatty infiltration of the liver. Small esophageal hiatal hernia. Infiltrates or atelectasis in the lung bases. Electronically Signed   By: Lucienne Capers M.D.   On: 09/05/2015 22:55   Dg Abd Acute W/chest  09/06/2015  CLINICAL DATA:  Nausea, vomiting, diarrhea. EXAM: DG ABDOMEN ACUTE W/ 1V CHEST COMPARISON:  Chest radiograph of August 24, 2013. FINDINGS: There is no evidence of dilated bowel loops or free intraperitoneal air. No radiopaque calculi or other significant radiographic abnormality is seen. Heart size and mediastinal contours are within normal limits. Both lungs are clear. IMPRESSION: No evidence of bowel obstruction or ileus. No acute cardiopulmonary disease. Electronically Signed   By: Marijo Conception, M.D.   On: 09/06/2015 15:17   US Abdomen Limited Ruq  09/06/2015  CLINICAL DATA:  Periumbilical pain for 2 hours EXAM: US ABDOMEN LIMITED - RIGHT UPPER QUADRANT COMPARISON:  CT scan 09/05/2015 FINDINGS: Gallbladder: No gallstones or wall thickening visualized. No sonographic Murphy sign noted by sonographer. Common bile duct: Diameter: 4 mm in diameter within normal limits. Liver: No focal lesion identified. Within normal limits in parenchymal echogenicity. IMPRESSION: Normal right upper quadrant ultrasound Electronically Signed   By: Lahoma Crocker M.D.   On: 09/06/2015 14:39     Assessment and Plan  82 y.o. female  NSTEMI (non-ST elevated myocardial infarction) ([place]) Active Problems:  Subdural hemorrhage (HCC)  HLD (hyperlipidemia)  Hepatic steatosis  Hypertension    1.  NSTEMI: Presenting with periumbilical pain/fullness Troponin elevation, up to 15 -Started heparin gtt on arrival Echo confirming normal ejection fraction, Mildly elevated right heart pressures On arrival  cannot rule out stress-induced cardiomyopathy vs ACS -Scheduled for LHC with Dr. Fletcher Anon, MD on Monday, 09/09/2015 at 8:30 AM ------ this has been put on hold given new 2:1  AV block, hypertension, mental status changes, anemia Discussed case with Dr. Johnsie Cancel and harding.  Patient will be transferred straight to the cardiac catheterization lab at [facility] For possible temporary wire, left heart cath poss pci Family is aware, Discussed case with   patient power of attorney, Jan or Thayer Headings. She is in agreement with transfer, she is in agreement with  temporary wire and catheterization with possible stenting Her plan is to drive to [place] but she lives in [place], will take her 1 hour Patient is DO NOT RESUSCITATE, this was confirmed with all family  2. HTN: Hypotensive this morning in the setting of 2-1 AV block, heart rate low 123456, systolic pressure 123XX123, lethargic/mental status changes  Nitro paste held, lisinopril held, carvedilol held Transfer to the ICU, started on dopamine for heart rate and blood pressure support  heart rate up to 123456, systolic pressure up to 95 -123XX123 systolic  3. History of subdural hemorrhage in 05/2013 in the setting of a fall:  4. Stress/anxiety: -Has prn Ativan at home  5. HLD: Total cholesterol 150, LDL 80  6. Hypothyroidism: -On replacement therapy   7. Anemia Drop in hematocrit today, on recheck, hematocrit is slightly lower She does report firmness in her left thigh with discomfort Phenergan IM was given at this location Unable to exclude thigh hematoma Family does report she has a history of diverticuli. No bowel movement in 3 days   Total encounter time more than 120 minutes  Greater than 50% was spent in counseling and coordination of care with the patient   Signed, Esmond Plants, MD, Ph.D. [facility] 09/08/2015, 10:52 AM

## 2015-09-08 NOTE — Consult Note (Signed)
PULMONARY / CRITICAL CARE MEDICINE   Name: Teresa Franklin MRN: [id number] DOB: Jun 01, 1930    ADMISSION DATE:  09/08/2015 CONSULTATION DATE:  09/08/2015  REFERRING MD:  Dr. Rebbeca Paul, cardiology.  CHIEF COMPLAINT:  Respiratory failure  HISTORY OF PRESENT ILLNESS:   82 year old female with PMH of HTN and remote ICH who developed a-fib and was taken to the cath lab emergently.  Occluded distal RCA was noted.  During procedure patient developed pulmonary edema and was intubated in the cath lab.  PCCM was called to assist with vent management.    PAST MEDICAL HISTORY :  She  has a past medical history of Hypertension; Diverticulitis; Osteopenia; Hypothyroidism; Hepatic artery stenosis ([place]); Sleep apnea; Subdural hemorrhage ([place]); Weakness; Generalized anxiety disorder; Back pain; HLD (hyperlipidemia); Hepatic steatosis; and OA (osteoarthritis).  PAST SURGICAL HISTORY: She  has no past surgical history on file.  Allergies  Allergen Reactions  . Codeine Nausea And Vomiting  . Flomax [Tamsulosin] Other (See Comments)    Pt states that this medication gave her a kidney infection.    Lebron Quam [Hydrocodone-Acetaminophen] Nausea And Vomiting  . Sulfa Antibiotics Nausea And Vomiting    No current facility-administered medications on file prior to encounter.   Current Outpatient Prescriptions on File Prior to Encounter  Medication Sig  . acetaminophen (TYLENOL) 325 MG tablet Take 650 mg by mouth every 6 (six) hours as needed for mild pain, fever or headache.   . calcium-vitamin D (OSCAL WITH D) 500-200 MG-UNIT tablet Take 1 tablet by mouth 2 (two) times daily.  . carvedilol (COREG) 12.5 MG tablet Take 12.5 mg by mouth 2 (two) times daily.  . FOLBIC 2.5-25-2 MG TABS tablet Take 1 tablet by mouth daily.  Marland Kitchen levothyroxine (SYNTHROID, LEVOTHROID) 25 MCG tablet Take 25 mcg by mouth daily before breakfast.   . lisinopril (PRINIVIL,ZESTRIL) 40 MG tablet Take 40 mg by mouth daily.  Marland Kitchen LORazepam  (ATIVAN) 0.5 MG tablet Take 0.5 mg by mouth 2 (two) times daily as needed for anxiety.  . Magnesium 250 MG TABS Take 250 mg by mouth daily.  . meloxicam (MOBIC) 7.5 MG tablet Take 7.5 mg by mouth daily as needed for pain.  Marland Kitchen metoCLOPramide (REGLAN) 5 MG tablet Take 1 tablet (5 mg total) by mouth every 8 (eight) hours as needed for nausea.  . Multiple Vitamins-Minerals (MULTIVITAMIN GUMMIES ADULT) CHEW Chew 2 each by mouth at bedtime.  . Multiple Vitamins-Minerals (PRESERVISION AREDS 2) CAPS Take 1 capsule by mouth daily.  Marland Kitchen omeprazole (PRILOSEC) 20 MG capsule Take 20 mg by mouth daily before breakfast.   . pravastatin (PRAVACHOL) 40 MG tablet Take 40 mg by mouth daily.   . promethazine (PHENERGAN) 25 MG suppository Place 1 suppository (25 mg total) rectally every 6 (six) hours as needed for nausea.  . simethicone (GAS-X) 80 MG chewable tablet Chew 1 tablet (80 mg total) by mouth 4 (four) times daily as needed for flatulence.    FAMILY HISTORY:  Her has no family status information on file.   SOCIAL HISTORY: She  reports that she has never smoked. She does not have any smokeless tobacco history on file. She reports that she does not drink alcohol or use illicit drugs.  REVIEW OF SYSTEMS:   Unable to attain, sedated and intubated.  SUBJECTIVE:  Sedated and intubated.  VITAL SIGNS: BP 153/61 mmHg  Pulse 72  Resp 16  SpO2 98%  HEMODYNAMICS:    VENTILATOR SETTINGS: Vent Mode:  [-] PRVC FiO2 (%):  [  80 %-100 %] 80 % Set Rate:  [16 bmp] 16 bmp Vt Set:  [460 mL] 460 mL PEEP:  [5 cmH20] 5 cmH20 Plateau Pressure:  [23 cmH20] 23 cmH20  INTAKE / OUTPUT:    PHYSICAL EXAMINATION: General:  Elderly appearing female, NAD. Neuro:  Sedated but arousable and moving all ext to pain not command due to sedation.  Non-focal exam. HEENT:  [place]/AT, PERRL, EOM-I and MMM. Cardiovascular:  RRR, Nl S1/S2, -M/R/G. Lungs:  Diffuse crackles. Abdomen:  Soft, NT, ND and +BS. Musculoskeletal:  -edema  and -tenderness. Skin:  Thin but intact.  LABS:  BMET  Recent Labs Lab 09/05/15 2058 09/06/15 1245 09/07/15 0522  NA 140 140 137  K 3.8 3.9 3.8  CL 104 104 107  CO2 25 27 24   BUN 14 14 16   CREATININE 0.68 0.77 0.71  GLUCOSE 129* 134* 133*    Electrolytes  Recent Labs Lab 09/05/15 2058 09/06/15 1245 09/07/15 0522  CALCIUM 10.1 9.9 8.6*    CBC  Recent Labs Lab 09/07/15 0522 09/08/15 0611 09/08/15 1159  WBC 7.8 7.3 9.7  HGB 12.4 10.2* 9.8*  HCT 35.8 29.7* 28.9*  PLT 75* 69* 76*    Coag's  Recent Labs Lab 09/06/15 1803  APTT 148*  INR 1.18    Sepsis Markers No results for input(s): LATICACIDVEN, PROCALCITON, O2SATVEN in the last 168 hours.  ABG  Recent Labs Lab 09/08/15 1651  PHART 7.270*  PCO2ART 41.2  PO2ART 95.0    Liver Enzymes  Recent Labs Lab 09/05/15 2058 09/06/15 1245  AST 58* 140*  ALT 18 27  ALKPHOS 64 64  BILITOT 0.8 0.7  ALBUMIN 4.3 4.5    Cardiac Enzymes  Recent Labs Lab 09/06/15 1803 09/06/15 2216 09/07/15 0522  TROPONINI 10.87* 10.67* 14.97*    Glucose  Recent Labs Lab 09/08/15 1210  GLUCAP 126*    Imaging No results found.   STUDIES:    CULTURES: None  ANTIBIOTICS: None  SIGNIFICANT EVENTS: 6/18 flash pulmonary edema, intubated.  LINES/TUBES: ETT 6/18>>> R IJ 6/18>>> R radial a-line 6/18>>> R femoral arterial and venous sheaths 6/18>>>  DISCUSSION: 82 year old female with HTN and CAD presenting to the ICU after being intubated in the cath lab for flash pulmonary edema.  Family wishes for short term intubation only.  ASSESSMENT / PLAN:  PULMONARY A: Acute respiratory failure due to acute pulmonary edema P:   - Full vent support. - Increase RR to 20 due to acidosis. - Will recheck ABG. - Once hemodynamically more stable and pulmonary edema improves then will consider weaning.  CARDIOVASCULAR A:  Cardiogenic shock NSTEMI P:  - Dopamine for BP support. - Cards managing  the CAD disease component.  - Hold home anti-HTN  RENAL A:   No renal disease history. P:   - Monitor while hypotensive.  GASTROINTESTINAL A:   No active issues. P:   - If intubated in AM then consult nutrition for TF.  HEMATOLOGIC A:   Thrombocytopenia. Anemia P:  - CBC in AM. - Transfuse per ICU protocol.  INFECTIOUS A:   No evidence of active infection. P:   - Monitor WBC and fever curves.  ENDOCRINE A:   Hypothyroidism  P:   Cont replacement  NEUROLOGIC A:   Acute encephalopathy  P:   RASS goal: -2 PAD protocol  Versed and fentanyl drips.  FAMILY  - Updates: No family bedside.  - Inter-disciplinary family meet or Palliative Care meeting due by:  7/25  The  patient is critically ill with multiple organ systems failure and requires high complexity decision making for assessment and support, frequent evaluation and titration of therapies, application of advanced monitoring technologies and extensive interpretation of multiple databases.   Critical Care Time devoted to patient care services described in this note is  35  Minutes. This time reflects time of care of this signee Dr Jennet Maduro. This critical care time does not reflect procedure time, or teaching time or supervisory time of PA/NP/Med student/Med Resident etc but could involve care discussion time.  Rush Farmer, M.D. [facility] Pulmonary/Critical Care Medicine. Pager: [phone]. After hours pager: [phone].  09/08/2015, 4:54 PM

## 2015-09-08 NOTE — Progress Notes (Signed)
Called to cath lab by Dr. Ellyn Hack to assist in management of patient with respiratory failure in setting of acute inferior STEMI.   82 y/o woman with h/o HTN, HL and remote ICH due to af all transferred from [facility] with NSTEMI. Dr. Ellyn Hack took to cath lab emergently and found to have occluded distal RCA. Procedure complicated by acute pulmonary edema and respiratory failure with persistent desaturations into the 70s. We gave IV lasix but patient with ongoing hypoxia and dyspnea. Patient previously was DNR/DNI. I discussed with family and we brought her daughter into cath lab. They agreed to short-term mechanical ventilation. Anesthesia called. Intubated by Dr. Al Corpus emergently. Patient then became more stable with sats in 90s.   Good urine output now noted.   Once intubated and stabilized I placed central line and radial arterial line for further management in CCU. Case discussed with CCM.   The patient is critically ill with multiple organ systems failure and requires high complexity decision making for assessment and support, frequent evaluation and titration of therapies, application of advanced monitoring technologies and extensive interpretation of multiple databases.   Critical Care Time devoted to patient care services described in this note is 40 minutes not including time for procedures.   Bensimhon, Daniel,MD 4:06 PM

## 2015-09-08 NOTE — H&P (Signed)
History and Physical Note  NAME:  Teresa Franklin   MRN: [id number] DOB:  April 04, 1930   ADMIT DATE: 09/06/2015   09/08/2015 1:09 PM  Teresa Franklin is a 82 y.o. female with history of subarachnoid hemorrhage in March 2015 setting of a fall. She also had hypertension and hyperlipidemia. She has no prior cardiac history or any prior cardiac evaluation including stress test or echocardiogram. She has been under increased stress of late with the poor health of her husband and two sisters. She was laying in her bed on the evening of 09/04/15 when she suddenly developed the need to pass gas. She did, though she had a large loss bowel movement rather than passing gas. Since that episode she has had peri-umbilical fullness/pain. Pain does not radiate. Some associated nausea, without emesis, diaphoresis, dizziness, presyncope, or syncope. Because her symtpoms persisted she presented to [facility] on 09/06/2015. She has never had any chest pain. She was noted to [facility], and I have a troponin level of 8.3 at 1:58 PM, lipase negative x 2, AST 140, ALT 27, T bili 0.7, K+ 3.9, SCr 0.77, PLT 116, WBC 6.9-->10.6, hgb 14.9. ECG showed NSR, 61 bpm, baseline wandering V2-V3, nonspecific inferior st/t changes, TWI leads III, aVF, V5-V6.  Dr. Fletcher Anon was consulted for cardiology evaluation of non-ST elevation MI. His saturation was either ischemic heart disease versus stress-induced cardiomyopathy. They plan on cardiac catheterization on Monday, June 19.  However on 09/08/2015 she was noted to go into second-degree heart block with associated bradycardia and hypotension. She was ultimately transferred to the intensive care unit given her change in condition and required dopamine for blood pressure and heart rate support. Given her worsening condition, Dr. Rockey Situ was called to reevaluate the patient, and it was suggested she be transferred to a tertiary facility as she may require more aggressive cardiovascular therapy including  electrophysiology, temporary pacemaker, formal pacemaker, urgent catheterization. I was called to set the patient is a urgent non-STEMI (although the EKG this morning does show signs of this may very well be an inferior STEMI) with temporary pacemaker placement.  She was also noted a drop in her hemoglobin from yesterday to today recheck hemoglobin pending at this time, she does complain of some swelling and pain left thigh site of previous outpatient IM injection which may require further imaging to rule out hematoma if continued change in hemoglobin level.  Dr. Burna Cash spoke with the patient's power of attorney (Jan) to obtain consent for this procedure. The patient is DO NOT RESUSCITATE however.   Past Medical History  Diagnosis Date  . Hypertension   . Diverticulitis   . Osteopenia   . Hypothyroidism   . Hepatic artery stenosis ([place])   . Sleep apnea   . Subdural hemorrhage ([place])   . Weakness   . Generalized anxiety disorder   . Back pain   . HLD (hyperlipidemia)   . Hepatic steatosis   . OA (osteoarthritis)    History reviewed. No pertinent past surgical history.  FAMHx: Family History  Problem Relation Age of Onset  . Hypertension Mother   . Hyperlipidemia Mother   . Stroke Mother   . Diabetes Sister   . Hypertension Sister   . Hyperlipidemia Sister   . Bipolar disorder Sister   . Hyperlipidemia Sister   . Stroke Sister   . Prostate cancer Brother     SOCHx:  reports that she has never smoked. She does not have any smokeless tobacco history on file.  She reports that she does not drink alcohol or use illicit drugs.  ALLERGIES: Allergies  Allergen Reactions  . Codeine Nausea And Vomiting  . Flomax [Tamsulosin] Other (See Comments)    Pt states that this medication gave her a kidney infection.    Lebron Quam [Hydrocodone-Acetaminophen] Nausea And Vomiting  . Sulfa Antibiotics Nausea And Vomiting    HOME MEDICATIONS: Prescriptions prior to admission  Medication Sig  Dispense Refill Last Dose  . acetaminophen (TYLENOL) 325 MG tablet Take 650 mg by mouth every 6 (six) hours as needed for mild pain, fever or headache.    09/06/2015 at 0000  . calcium-vitamin D (OSCAL WITH D) 500-200 MG-UNIT tablet Take 1 tablet by mouth 2 (two) times daily.   Past Week at Unknown time  . carvedilol (COREG) 12.5 MG tablet Take 12.5 mg by mouth 2 (two) times daily.   Past Week at Unknown time  . FOLBIC 2.5-25-2 MG TABS tablet Take 1 tablet by mouth daily.   Past Week at Unknown time  . levothyroxine (SYNTHROID, LEVOTHROID) 25 MCG tablet Take 25 mcg by mouth daily before breakfast.    09/06/2015 at Unknown time  . lisinopril (PRINIVIL,ZESTRIL) 40 MG tablet Take 40 mg by mouth daily.   09/05/2015 at Unknown time  . LORazepam (ATIVAN) 0.5 MG tablet Take 0.5 mg by mouth 2 (two) times daily as needed for anxiety.   09/05/2015 at Unknown time  . Magnesium 250 MG TABS Take 250 mg by mouth daily.   Past Week at Unknown time  . meloxicam (MOBIC) 7.5 MG tablet Take 7.5 mg by mouth daily as needed for pain.   Past Month at Unknown time  . Multiple Vitamins-Minerals (MULTIVITAMIN GUMMIES ADULT) CHEW Chew 2 each by mouth at bedtime.   Past Week at Unknown time  . Multiple Vitamins-Minerals (PRESERVISION AREDS 2) CAPS Take 1 capsule by mouth daily.   Past Week at Unknown time  . omeprazole (PRILOSEC) 20 MG capsule Take 20 mg by mouth daily before breakfast.    09/06/2015 at Unknown time  . pravastatin (PRAVACHOL) 40 MG tablet Take 40 mg by mouth daily.    Past Week at Unknown time  . metoCLOPramide (REGLAN) 5 MG tablet Take 1 tablet (5 mg total) by mouth every 8 (eight) hours as needed for nausea. 15 tablet 0   . promethazine (PHENERGAN) 25 MG suppository Place 1 suppository (25 mg total) rectally every 6 (six) hours as needed for nausea. 12 suppository 0   . simethicone (GAS-X) 80 MG chewable tablet Chew 1 tablet (80 mg total) by mouth 4 (four) times daily as needed for flatulence. 20 tablet 0      Inpatient Medications:  . aspirin 81 mg Oral Daily  . budesonide (PULMICORT) nebulizer solution 0.25 mg Nebulization BID  . calcium-vitamin D 1 tablet Oral BID  . carvedilol 12.5 mg Oral BID  . docusate sodium 100 mg Oral BID  . folic acid 2.5 mg Oral Daily  . levothyroxine 25 mcg Oral QAC breakfast  . lisinopril 40 mg Oral Daily  . magnesium oxide 400 mg Oral Daily  . multivitamin with minerals 1 tablet Oral QHS  . multivitamin-lutein 2 capsule Oral Daily  . pantoprazole 40 mg Oral Daily  . pravastatin 40 mg Oral Daily  . pyridOXINE 25 mg Oral Daily  . sodium chloride flush 3 mL Intravenous Q12H  . vitamin B-12 2,000 mcg Oral Daily         Review of Systems: Review of Systems  Constitutional:   Lethargic, appears uncomfortable, mild back pain, left leg pain  Respiratory: Negative.  Cardiovascular: Negative.  Gastrointestinal: Negative.  Musculoskeletal: Negative.   Left leg pain  Neurological: Negative.  Psychiatric/Behavioral: Negative.  All other systems reviewed and are negative.   PHYSICAL EXAM:Blood pressure 97/43, pulse 72, temperature 96.1 F (35.6 C), temperature source Axillary, resp. rate 19, height 5\' 2"  (1.575 m), weight 125 lb 12.8 oz (57.063 kg), SpO2 96 %.  Blood pressure 87/45 mmHg heart rate 58 -on 10 g/kg/min Dopamine General: acute distress appears mildly agitated,  Head: Normocephalic, atraumatic, sclera non-icteric, no xanthomas, nares are without discharge. Neck: Negative for carotid bruits. JVP not elevated. Lungs: Clear bilaterally to auscultation without wheezes, rales, or rhonchi. Breathing is unlabored. Heart: RRR S1 S2 without murmurs, rubs, or gallops, bradycardia.  Abdomen: Soft, non-tender, non-distended with normoactive bowel sounds. No rebound/guarding. Extremities: No clubbing or cyanosis. No edema. Distal pedal  pulses are 2+ and equal bilaterally. Neuro: Lethargic, moves all extremities spontaneously, but able to answer questions  Psych: not fully alert, unable to converse   Adult ECG Report - from this AM 6/18  Rate: 47 ;  Rhythm: Appears to be sinus rhythm with 2-1 AV block.  Nonspecific ST-T wave abnormalities are noted, however reviewing of leads III and aVF there does appear to be subtle ST segment changes with elevation and T-wave inversion.   Narrative Interpretation: Does not meet criteria for STEMI, however in the present condition this would suggest inferior MI.  Lab Results  Component Value Date   TROPONINI 14.97* 09/07/2015   Lab Results  Component Value Date   CREATININE 0.71 09/07/2015   Lab Results  Component Value Date   HGB 9.8* 09/08/2015   Lab Results  Component Value Date   K 3.8 09/07/2015    Echocardiogram  Left ventricle: The cavity size was normal. There was mild focal basal hypertrophy of the septum. Systolic function was normal. EF 60% to 65%.  Wall motion was normal; there were no regional wall motion abnormalities. Left ventricular diastolic function parameters  were normal. - Mitral valve: There was mild to moderate regurgitation. - Left atrium: The atrium was mildly dilated. - Right ventricle: Systolic function was normal. - Tricuspid valve: There was moderate regurgitation. - Pulmonary arteries: Systolic pressure was mild to moderately elevated. PA peak pressure: 49 mm Hg (S).   IMPRESSION & PLAN Teresa Franklin has presented today for surgery, with the diagnosis of non-ST elevation MI with high-grade AV block (currently being determined to be complete heart block by Dr. Penni Homans prior to transport).  She is in borderline cardiac shock on dopamine infusion. The various methods of treatment have been discussed with the patient and family.  Nitroglycerin paste was discontinued as well as carvedilol and lisinopril held prior to transport and she was placed  on dopamine.  Principal Problem:   NSTEMI (non-ST elevated myocardial infarction) ([place]) Active Problems:   Complete heart block (HCC)   Acute blood loss anemia -thought to be related to in her muscular injection of Phenergan   Cardiogenic shock (HCC)   Subdural hemorrhage (HCC) -history of   Hyperlipidemia with target LDL less than 70   Heart block AV second degree   Essential hypertension   Arterial hypotension   Hepatic steatosis  She is being transported to [facility] for urgent cardiac catheterization plus/PCI along with temporary pacemaker placement  Artery transport, Dr. Rockey Situ discussed the catheterization procedure with risks and complications with the patient's power  of attorney (Jan). Risks / Complications include, but not limited to: Death, MI, CVA/TIA, VF/VT (with defibrillation), Bradycardia (need for temporary pacer placement), contrast induced nephropathy, bleeding / bruising / hematoma / pseudoaneurysm, vascular or coronary injury (with possible emergent CT or Vascular Surgery), adverse medication reactions, infection.     After consideration of risks, benefits and other options for treatment, the patient's power of attorney has consented to Procedure(s): Also Implied Emergency Consent LEFT HEART CATHETERIZATION AND CORONARY ANGIOGRAPHY +/- AD Callao  as a surgical intervention.   We will proceed with the planned procedure.  With hypotension on dopamine, I plan to use femoral access to avoid the necessity of verapamil, as well as to allow for femoral artery and vein puncture.  She was noted to have a hemoglobin drop prior to transfer, this was thought related to potential hematoma on the left thigh site of Phenergan injection. No sign of GI bleed, however I would be inclined to consider bare-metal stent placement if necessary.  She is on replacement therapy for hypothyroidism. Will be placed on  statin for lipids. LDL was 80, so likely not high-dose   [facility] [address] [place], [place]  [address]  [phone]  09/08/2015 1:09 PM

## 2015-09-08 NOTE — Progress Notes (Signed)
Patient is requesting for fleet enema to be able to evacuate her bowel. Dr. Marcille Blanco notified and he stated he was going to add some stool softer to her medications. Will continue to monitor.

## 2015-09-08 NOTE — Anesthesia Procedure Notes (Signed)
Procedure Name: Intubation Date/Time: 09/08/2015 2:40 PM Performed by: Neldon Newport Pre-anesthesia Checklist: Timeout performed, Patient being monitored, Emergency Drugs available, Patient identified and Suction available Patient Re-evaluated:Patient Re-evaluated prior to inductionOxygen Delivery Method: Circle system utilized Preoxygenation: Pre-oxygenation with 100% oxygen Intubation Type: IV induction, Rapid sequence and Cricoid Pressure applied Laryngoscope Size: Glidescope and 3 Grade View: Grade II Tube type: Subglottic suction tube Tube size: 7.5 mm Number of attempts: 1 Placement Confirmation: CO2 detector,  breath sounds checked- equal and bilateral and ETT inserted through vocal cords under direct vision Secured at: 21 cm Tube secured with: Tape Dental Injury: Teeth and Oropharynx as per pre-operative assessment

## 2015-09-08 NOTE — Anesthesia Postprocedure Evaluation (Signed)
Anesthesia Post Note  Patient: Teresa Franklin  Procedure(s) Performed: * No procedures listed *  Patient location during evaluation: Cath Lab Anesthesia Type: General Level of consciousness: sedated Vital Signs Assessment: post-procedure vital signs reviewed and stable Respiratory status: patient remains intubated per anesthesia plan Cardiovascular status: blood pressure returned to baseline Anesthetic complications: no    Last Vitals: There were no vitals filed for this visit.  Last Pain: There were no vitals filed for this visit.               Durinda Buzzelli A

## 2015-09-08 NOTE — Progress Notes (Signed)
Orthopedic Tech Progress Note Patient Details:  Teresa Franklin [birth year] [id number] Delivered Velcro knee immobilizer to pt.'s nurse. Ortho Devices Type of Ortho Device: Knee Immobilizer Ortho Device/Splint Interventions: Other (comment)   Darrol Poke 09/08/2015, 4:33 PM

## 2015-09-08 NOTE — Anesthesia Procedure Notes (Signed)
Procedures Intubation:  Pre 02 by mask, Glidescope with cords well seen in emergent situation. Cricoid pressure, 7.0 ETT passed with stylet to BBS. Tolerated well.

## 2015-09-08 NOTE — Consult Note (Signed)
ANTICOAGULATION CONSULT NOTE - Initial Consult  Pharmacy Consult for Heparin Indication: chest pain/ACS  Allergies  Allergen Reactions  . Codeine Nausea And Vomiting  . Flomax [Tamsulosin] Other (See Comments)    Pt states that this medication gave her a kidney infection.    Lebron Quam [Hydrocodone-Acetaminophen] Nausea And Vomiting  . Sulfa Antibiotics Nausea And Vomiting    Patient Measurements: Height: 5\' 2"  (157.5 cm) Weight: 125 lb 12.8 oz (57.063 kg) IBW/kg (Calculated) : 50.1 Heparin Dosing Weight: 57.2 kg  Vital Signs: Temp: 97.8 F (36.6 C) (06/18 0421) BP: 119/61 mmHg (06/18 0421) Pulse Rate: 72 (06/18 0421)  Labs:  Recent Labs  09/05/15 2058 09/06/15 1245  09/06/15 1803 09/06/15 2216 09/07/15 0107 09/07/15 0522 09/07/15 0823 09/08/15 0611  HGB 14.9 14.8  --   --   --   --  12.4  --  10.2*  HCT 43.5 43.3  --   --   --   --  35.8  --  29.7*  PLT 116* 116*  --   --   --   --  75*  --  69*  APTT  --   --   --  148*  --   --   --   --   --   LABPROT  --   --   --  15.2*  --   --   --   --   --   INR  --   --   --  1.18  --   --   --   --   --   HEPARINUNFRC  --   --   --   --   --  0.54  --  0.54 0.31  CREATININE 0.68 0.77  --   --   --   --  0.71  --   --   TROPONINI  --   --   < > 10.87* 10.67*  --  14.97*  --   --   < > = values in this interval not displayed.  Estimated Creatinine Clearance: 42.9 mL/min (by C-G formula based on Cr of 0.71).   Medical History: Past Medical History  Diagnosis Date  . Hypertension   . Diverticulitis   . Osteopenia   . Hypothyroidism   . Hepatic artery stenosis ([place])   . Sleep apnea   . Subdural hemorrhage ([place])   . Weakness   . Generalized anxiety disorder   . Back pain   . HLD (hyperlipidemia)   . Hepatic steatosis   . OA (osteoarthritis)     Medications:  Scheduled:  . aspirin  81 mg Oral Daily  . budesonide (PULMICORT) nebulizer solution  0.25 mg Nebulization BID  . calcium-vitamin D  1 tablet Oral BID   . carvedilol  12.5 mg Oral BID  . docusate sodium  100 mg Oral BID  . folic acid  2.5 mg Oral Daily  . levothyroxine  25 mcg Oral QAC breakfast  . lisinopril  40 mg Oral Daily  . magnesium oxide  400 mg Oral Daily  . multivitamin with minerals  1 tablet Oral QHS  . multivitamin-lutein  2 capsule Oral Daily  . pantoprazole  40 mg Oral Daily  . pravastatin  40 mg Oral Daily  . pyridOXINE  25 mg Oral Daily  . sodium chloride flush  3 mL Intravenous Q12H  . vitamin B-12  2,000 mcg Oral Daily    Assessment: DF is an 82 yo female presenting  with nausea and abdominal pain, found to have elevated troponins. Pharmacy consulted to manage heparin in this patient.   A review of PTA meds found no anticoagulation.  [place] PT/INR and aPTT ordered.  Goal of Therapy:  Heparin level 0.3-0.7 units/ml Monitor platelets by anticoagulation protocol: Yes   Plan:  Current orders for heparin 700 units/hr. Repeat heparin level therapeutic. Will recheck HL and CBC with AM labs  6/18 AM heparin level 0.31. Continue current regimen. Recheck heparin level and CBC with tomorrow AM labs.  Platelets trending down, 69 this morning from 116 on admission. Discussed with Dr. Lavetta Nielsen. Will continue to monitor, recheck CBC with AM labs  Pharmacy will continue to monitor.  Beautifull Cisar C 09/08/2015,11:00 AM

## 2015-09-09 ENCOUNTER — Other Ambulatory Visit: Payer: Self-pay

## 2015-09-09 ENCOUNTER — Encounter (HOSPITAL_COMMUNITY): Payer: Self-pay | Admitting: Cardiology

## 2015-09-09 ENCOUNTER — Encounter (HOSPITAL_COMMUNITY)
Admission: EM | Disposition: A | Discharge status: Skilled Nursing Facility | Payer: Self-pay | Source: Ambulatory Visit | Attending: Cardiology

## 2015-09-09 ENCOUNTER — Inpatient Hospital Stay (HOSPITAL_COMMUNITY): Payer: Medicare Other

## 2015-09-09 DIAGNOSIS — R079 Chest pain, unspecified: Secondary | ICD-10-CM

## 2015-09-09 DIAGNOSIS — I441 Atrioventricular block, second degree: Secondary | ICD-10-CM

## 2015-09-09 DIAGNOSIS — I442 Atrioventricular block, complete: Secondary | ICD-10-CM

## 2015-09-09 DIAGNOSIS — I214 Non-ST elevation (NSTEMI) myocardial infarction: Principal | ICD-10-CM

## 2015-09-09 DIAGNOSIS — J9600 Acute respiratory failure, unspecified whether with hypoxia or hypercapnia: Secondary | ICD-10-CM | POA: Diagnosis present

## 2015-09-09 HISTORY — PX: CARDIAC CATHETERIZATION: SHX172

## 2015-09-09 LAB — CBC
HEMATOCRIT: 29.7 % — AB (ref 36.0–46.0)
Hemoglobin: 10.2 g/dL — ABNORMAL LOW (ref 12.0–15.0)
MCH: 30.6 pg (ref 26.0–34.0)
MCHC: 34.3 g/dL (ref 30.0–36.0)
MCV: 89.2 fL (ref 78.0–100.0)
Platelets: 116 10*3/uL — ABNORMAL LOW (ref 150–400)
RBC: 3.33 MIL/uL — AB (ref 3.87–5.11)
RDW: 14.1 % (ref 11.5–15.5)
WBC: 11.5 10*3/uL — AB (ref 4.0–10.5)

## 2015-09-09 LAB — COMPREHENSIVE METABOLIC PANEL
ALBUMIN: 2.6 g/dL — AB (ref 3.5–5.0)
ALK PHOS: 79 U/L (ref 38–126)
ALT: 46 U/L (ref 14–54)
ANION GAP: 7 (ref 5–15)
AST: 80 U/L — ABNORMAL HIGH (ref 15–41)
BILIRUBIN TOTAL: 1 mg/dL (ref 0.3–1.2)
BUN: 13 mg/dL (ref 6–20)
CALCIUM: 8.4 mg/dL — AB (ref 8.9–10.3)
CO2: 21 mmol/L — ABNORMAL LOW (ref 22–32)
CREATININE: 0.89 mg/dL (ref 0.44–1.00)
Chloride: 108 mmol/L (ref 101–111)
GFR calc Af Amer: >60 mL/min (ref >60)
GFR calc non Af Amer: 59 mL/min — ABNORMAL LOW (ref >60)
GLUCOSE: 151 mg/dL — AB (ref 65–99)
Potassium: 3.2 mmol/L — ABNORMAL LOW (ref 3.5–5.1)
Sodium: 136 mmol/L (ref 135–145)
TOTAL PROTEIN: 5.3 g/dL — AB (ref 6.5–8.1)

## 2015-09-09 LAB — LIPID PANEL
Cholesterol: 114 mg/dL (ref 0–200)
HDL: 38 mg/dL — ABNORMAL LOW (ref >40)
LDL Cholesterol: 54 mg/dL (ref 0–99)
Total CHOL/HDL Ratio: 3 RATIO
Triglycerides: 111 mg/dL (ref <150)
VLDL: 22 mg/dL (ref 0–40)

## 2015-09-09 LAB — BLOOD GAS, ARTERIAL
ACID-BASE DEFICIT: 2.2 mmol/L — AB (ref 0.0–2.0)
BICARBONATE: 20.9 meq/L (ref 20.0–24.0)
Drawn by: 23604
FIO2: 70
LHR: 20 {breaths}/min
O2 SAT: 97 %
PCO2 ART: 28.7 mmHg — AB (ref 35.0–45.0)
PEEP: 5 cmH2O
PH ART: 7.475 — AB (ref 7.350–7.450)
Patient temperature: 98.6
TCO2: 21.8 mmol/L (ref 0–100)
VT: 460 mL
pO2, Arterial: 82.5 mmHg (ref 80.0–100.0)

## 2015-09-09 LAB — GLUCOSE, CAPILLARY
GLUCOSE-CAPILLARY: 157 mg/dL — AB (ref 65–99)
GLUCOSE-CAPILLARY: 194 mg/dL — AB (ref 65–99)
GLUCOSE-CAPILLARY: 154 mg/dL — AB (ref 65–99)
Glucose-Capillary: 137 mg/dL — ABNORMAL HIGH (ref 65–99)

## 2015-09-09 LAB — POCT ACTIVATED CLOTTING TIME: ACTIVATED CLOTTING TIME: 444 s

## 2015-09-09 LAB — POCT I-STAT, CHEM 8
BUN: 14 mg/dL (ref 6–20)
CALCIUM ION: 1.22 mmol/L (ref 1.13–1.30)
CHLORIDE: 104 mmol/L (ref 101–111)
Creatinine, Ser: 0.9 mg/dL (ref 0.44–1.00)
Glucose, Bld: 193 mg/dL — ABNORMAL HIGH (ref 65–99)
HCT: 30 % — ABNORMAL LOW (ref 36.0–46.0)
HEMOGLOBIN: 10.2 g/dL — AB (ref 12.0–15.0)
POTASSIUM: 3.8 mmol/L (ref 3.5–5.1)
SODIUM: 137 mmol/L (ref 135–145)
TCO2: 19 mmol/L (ref 0–100)

## 2015-09-09 LAB — ECHOCARDIOGRAM COMPLETE
CHL CUP MV DEC (S): 137
EWDT: 137 ms
FS: 26 % — AB (ref 28–44)
Height: 64 in
IV/PV OW: 0.95
LA ID, A-P, ES: 36 mm
LA vol: 51.4 mL
LADIAMINDEX: 2.14 cm/m2
LAVOLA4C: 45.9 mL
LAVOLIN: 30.6 mL/m2
LDCA: 2.54 cm2
LEFT ATRIUM END SYS DIAM: 36 mm
LVOT diameter: 18 mm
MV Peak grad: 5 mmHg
MV pk E vel: 112 m/s
PISA EROA: 0.26 cm2
PW: 8.55 mm — AB (ref 0.6–1.1)
RV TAPSE: 13.5 mm
VTI: 151 cm
Weight: 2222.24 oz

## 2015-09-09 LAB — MAGNESIUM
MAGNESIUM: 1.6 mg/dL — AB (ref 1.7–2.4)
MAGNESIUM: 3.3 mg/dL — AB (ref 1.7–2.4)
Magnesium: 1.5 mg/dL — ABNORMAL LOW (ref 1.7–2.4)

## 2015-09-09 LAB — PHOSPHORUS
PHOSPHORUS: 2.4 mg/dL — AB (ref 2.5–4.6)
Phosphorus: 2.2 mg/dL — ABNORMAL LOW (ref 2.5–4.6)
Phosphorus: 3 mg/dL (ref 2.5–4.6)

## 2015-09-09 SURGERY — LEFT HEART CATH AND CORONARY ANGIOGRAPHY
Anesthesia: Moderate Sedation

## 2015-09-09 SURGERY — TEMPORARY PACEMAKER

## 2015-09-09 MED ORDER — VITAL HIGH PROTEIN PO LIQD: 1000.0000 mL | ORAL | Status: DC

## 2015-09-09 MED ORDER — PANTOPRAZOLE SODIUM 40 MG PO PACK
40.0000 mg | PACK | Freq: Every day | ORAL | Status: DC
  Administered 2015-09-09 – 2015-09-15 (×7): 40 mg
  Filled 2015-09-09 (×7): qty 20

## 2015-09-09 MED ORDER — SODIUM CHLORIDE 0.9% FLUSH
3.0000 mL | Freq: Two times a day (BID) | INTRAVENOUS | Status: DC
  Administered 2015-09-09 – 2015-09-11 (×4): 3 mL via INTRAVENOUS

## 2015-09-09 MED ORDER — SODIUM CHLORIDE 0.9% FLUSH: 3.0000 mL | INTRAVENOUS | Status: DC | PRN

## 2015-09-09 MED ORDER — PRO-STAT SUGAR FREE PO LIQD
30.0000 mL | Freq: Two times a day (BID) | ORAL | Status: DC
  Administered 2015-09-09: 30 mL
  Filled 2015-09-09: qty 30

## 2015-09-09 MED ORDER — SODIUM CHLORIDE 0.9 % IV SOLN
INTRAVENOUS | Status: DC
Start: 2015-09-09 — End: 2015-09-09

## 2015-09-09 MED ORDER — HEPARIN (PORCINE) IN NACL 2-0.9 UNIT/ML-% IJ SOLN
INTRAMUSCULAR | Status: DC | PRN
  Administered 2015-09-09: 500 mL

## 2015-09-09 MED ORDER — ONDANSETRON HCL 4 MG/2ML IJ SOLN: 4.0000 mg | Freq: Four times a day (QID) | INTRAMUSCULAR | Status: DC | PRN

## 2015-09-09 MED ORDER — SODIUM CHLORIDE 0.9 % WEIGHT BASED INFUSION
3.0000 mL/kg/h | INTRAVENOUS | Status: DC
  Administered 2015-09-10: 3 mL/kg/h via INTRAVENOUS

## 2015-09-09 MED ORDER — LIDOCAINE HCL (PF) 1 % IJ SOLN
INTRAMUSCULAR | Status: DC | PRN
  Administered 2015-09-09: 15 mL

## 2015-09-09 MED ORDER — ATROPINE SULFATE 1 MG/10ML IJ SOSY
PREFILLED_SYRINGE | INTRAMUSCULAR | Status: AC
  Administered 2015-09-09: 1 mg
  Filled 2015-09-09: qty 10

## 2015-09-09 MED ORDER — POTASSIUM CHLORIDE CRYS ER 20 MEQ PO TBCR: 40.0000 meq | EXTENDED_RELEASE_TABLET | Freq: Once | ORAL | Status: DC

## 2015-09-09 MED ORDER — PROSIGHT PO TABS
1.0000 | ORAL_TABLET | Freq: Every day | ORAL | Status: DC
  Administered 2015-09-09 – 2015-09-18 (×10): 1 via ORAL
  Filled 2015-09-09 (×10): qty 1

## 2015-09-09 MED ORDER — ACETAMINOPHEN 325 MG PO TABS
650.0000 mg | ORAL_TABLET | ORAL | Status: DC | PRN
  Administered 2015-09-12 – 2015-09-17 (×5): 650 mg via ORAL
  Filled 2015-09-09 (×5): qty 2

## 2015-09-09 MED ORDER — SODIUM CHLORIDE 0.9 % IV SOLN
INTRAVENOUS | Status: DC | PRN
  Administered 2015-09-09: 10 mL/h via INTRAVENOUS

## 2015-09-09 MED ORDER — VITAL AF 1.2 CAL PO LIQD
1000.0000 mL | ORAL | Status: DC
  Administered 2015-09-09 – 2015-09-10 (×2): 1000 mL

## 2015-09-09 MED ORDER — SODIUM CHLORIDE 0.9% FLUSH
3.0000 mL | Freq: Two times a day (BID) | INTRAVENOUS | Status: DC
  Administered 2015-09-09: 3 mL via INTRAVENOUS

## 2015-09-09 MED ORDER — POTASSIUM CHLORIDE 20 MEQ/15ML (10%) PO SOLN
40.0000 meq | Freq: Every day | ORAL | Status: DC
  Administered 2015-09-09: 40 meq via ORAL
  Filled 2015-09-09: qty 30

## 2015-09-09 MED ORDER — SODIUM CHLORIDE 0.9 % IV SOLN
4.0000 ug/kg/min | INTRAVENOUS | Status: DC
  Filled 2015-09-09: qty 50

## 2015-09-09 MED ORDER — LORAZEPAM 2 MG/ML IJ SOLN
0.5000 mg | Freq: Two times a day (BID) | INTRAMUSCULAR | Status: DC | PRN
  Administered 2015-09-10 – 2015-09-18 (×9): 0.5 mg via INTRAVENOUS
  Filled 2015-09-09 (×9): qty 1

## 2015-09-09 MED ORDER — SODIUM CHLORIDE 0.9 % IV SOLN: 250.0000 mL | INTRAVENOUS | Status: DC | PRN

## 2015-09-09 MED ORDER — SODIUM CHLORIDE 0.9 % WEIGHT BASED INFUSION
1.0000 mL/kg/h | INTRAVENOUS | Status: DC
  Administered 2015-09-10: 1 mL/kg/h via INTRAVENOUS

## 2015-09-09 MED ORDER — POTASSIUM CHLORIDE 20 MEQ/15ML (10%) PO SOLN
40.0000 meq | Freq: Once | ORAL | Status: AC
  Administered 2015-09-09: 40 meq via ORAL
  Filled 2015-09-09: qty 30

## 2015-09-09 MED ORDER — ANTISEPTIC ORAL RINSE SOLUTION (CORINZ)
7.0000 mL | OROMUCOSAL | Status: DC
  Administered 2015-09-09 – 2015-09-10 (×11): 7 mL via OROMUCOSAL

## 2015-09-09 MED ORDER — SODIUM CHLORIDE 0.9 % IV SOLN
Freq: Once | INTRAVENOUS | Status: AC
  Administered 2015-09-09: 250 mL via INTRAVENOUS

## 2015-09-09 MED ORDER — ATROPINE SULFATE 1 MG/10ML IJ SOSY: 1.0000 mg | PREFILLED_SYRINGE | Freq: Once | INTRAMUSCULAR | Status: AC

## 2015-09-09 MED ORDER — SODIUM PHOSPHATES 45 MMOLE/15ML IV SOLN
10.0000 mmol | Freq: Once | INTRAVENOUS | Status: AC
  Administered 2015-09-09: 10 mmol via INTRAVENOUS
  Filled 2015-09-09: qty 3.33

## 2015-09-09 MED ORDER — MAGNESIUM SULFATE 4 GM/100ML IV SOLN
4.0000 g | Freq: Once | INTRAVENOUS | Status: AC
  Administered 2015-09-09: 4 g via INTRAVENOUS
  Filled 2015-09-09: qty 100

## 2015-09-09 SURGICAL SUPPLY — 4 items
CATH S G BIP PACING (SET/KITS/TRAYS/PACK) ×3 IMPLANT
PACK CARDIAC CATHETERIZATION (CUSTOM PROCEDURE TRAY) ×3 IMPLANT
SHEATH PINNACLE 6F 10CM (SHEATH) ×3 IMPLANT
SLEEVE REPOSITIONING LENGTH 30 (MISCELLANEOUS) ×3 IMPLANT

## 2015-09-09 NOTE — Progress Notes (Signed)
[place] MD notified of critical morning EKG report. Pt's vitals stable, will continue to monitor.

## 2015-09-09 NOTE — Progress Notes (Signed)
84 / 47 

## 2015-09-09 NOTE — Progress Notes (Signed)
Dr. Marlou Porch notified of bradycardia and Hypotension.  EKG ordered and Dr. Marlou Porch on his way to evaluate pt.  Will continue to monitor pt closely.

## 2015-09-09 NOTE — Progress Notes (Signed)
35 cc versed drip wasted in sink followed by water flush by 2 RN

## 2015-09-09 NOTE — Progress Notes (Signed)
Echocardiogram 2D Echocardiogram has been performed.  Aggie Cosier 09/09/2015, 10:56 AM

## 2015-09-09 NOTE — H&P (View-Only) (Signed)
    Began to demonstrate 2:1 AVB with heart rate of 45bpm. This correlated with BP drop to 80 SBP.   Coreg 12.5mg  has been DC'd. AMIO is off from this AM  Dopamine is 8mcg. Increased. Feet cold.  Gave 255ml bolus as well as 1amp Atropine.   With acute hypotension in the setting of worsening conduction, will send emergently to cardiac cath lab for temp wire placement. Discussed with Dr. Gwenlyn Found.  Spoke to son, Cherlynn Kaiser after unable to reach daughter Thayer Headings. He consented for replacement of temp venous pacing wire. He understood procedure since wire was placed recently by Dr. Rockey Situ.   Critical care time 46min - patient, tele, data, Dr. Gwenlyn Found, acute decompensation hemodynamically, NSTEMI, critically ill.   Candee Furbish, MD

## 2015-09-09 NOTE — Progress Notes (Addendum)
Cardiologist: Dr. Fletcher Anon Subjective:  Sedate on vent.  Previous subarachnoid hemorrhage in March 2015 in the setting of fall with no prior cardiac history up until this admission who has been under increased stress of late with the poor health of her husband and 2 sisters. She developed second-degree heart block, ST segment changes, cardiogenic shock, pulmonary edema and was sent from [place] [facility] for emergent cardiac catheterization.  Echo earlier on this admission showed an EF of 60-65%  Objective:  Vital Signs in the last 24 hours: Temp:  [96.1 F (35.6 C)-99.9 F (37.7 C)] 99.9 F (37.7 C) (06/19 0725) Pulse Rate:  [0-121] 31 (06/19 0738) Resp:  [0-63] 20 (06/19 0738) BP: (79-153)/(40-95) 103/61 mmHg (06/19 0725) SpO2:  [0 %-100 %] 98 % (06/19 0738) Arterial Line BP: (106-152)/(53-116) 127/116 mmHg (06/18 2100) FiO2 (%):  [60 %-100 %] 60 % (06/19 0738) Weight:  [135 lb 12.9 oz (61.6 kg)-138 lb 14.2 oz (63 kg)] 138 lb 14.2 oz (63 kg) (06/19 0500)  Intake/Output from previous day: 06/18 0701 - 06/19 0700 In: 795.6 [I.V.:735.6; NG/GT:60] Out: 1625 [Urine:1625]   Physical Exam: General: Elderly, intubated, well nourished, comfortable on vent Head:  Normocephalic and atraumatic. ET tube Lungs: Clear to auscultation and percussion. Heart: Normal S1 and S2, occasional ectopy.  No murmur, rubs or gallops.  Abdomen: soft, non-tender, positive bowel sounds. Extremities: No clubbing or cyanosis. No edema. Soft restraints in place Neurologic: Alert and oriented x 3.    Lab Results:  Recent Labs  09/08/15 1159 09/09/15 0350  WBC 9.7 11.5*  HGB 9.8* 10.2*  PLT 76* 116*    Recent Labs  09/07/15 0522 09/09/15 0350  NA 137 136  K 3.8 3.2*  CL 107 108  CO2 24 21*  GLUCOSE 133* 151*  BUN 16 13  CREATININE 0.71 0.89    Recent Labs  09/06/15 2216 09/07/15 0522  TROPONINI 10.67* 14.97*   Hepatic Function Panel  Recent Labs  09/09/15 0350  PROT 5.3*    ALBUMIN 2.6*  AST 80*  ALT 46  ALKPHOS 79  BILITOT 1.0    Recent Labs  09/09/15 0350  CHOL 114   No results for input(s): PROTIME in the last 72 hours.  Imaging: Dg Chest Port 1 View  09/09/2015  CLINICAL DATA:  82 year old female with endotracheal tube in place. Weakness. Hypertension. Myocardial infarction. Subsequent encounter. EXAM: PORTABLE CHEST 1 VIEW COMPARISON:  09/08/2015. FINDINGS: Endotracheal tube tip is 2.8 cm above the carina. Right central line tip distal superior vena cava level. Nasogastric tube courses below the diaphragm. Tip is not included on the present exam. Pulmonary vascular congestion/ mild pulmonary edema possibly with posteriorly layering pleural fluid. Persistent consolidation retrocardiac region may represent atelectasis, infiltrate or mass. Appearance unchanged. Prominence of right hilar region may represent pulmonary vasculature. Attention to this on follow-up. Calcified aorta. Prominent right shoulder joint degenerative changes. No gross pneumothorax. IMPRESSION: Pulmonary vascular congestion/ pulmonary edema. Consolidation retrocardiac region. Appearance unchanged. Prominence right hilar region may represent pulmonary artery. Attention to this on follow-up. Electronically Signed   By: Genia Del M.D.   On: 09/09/2015 07:08   Dg Chest Port 1 View  09/08/2015  CLINICAL DATA:  Endotracheal tube placement.  Initial encounter. EXAM: PORTABLE CHEST 1 VIEW COMPARISON:  Chest radiograph performed earlier today at 6:35 p.m. FINDINGS: The patient's endotracheal tube is seen ending 2-3 cm above the carina. An enteric tube is noted extending below the diaphragm. The right IJ line is noted  ending about the distal SVC. Vascular congestion is noted. Right apical and left basilar airspace opacities may reflect pulmonary edema or possibly pneumonia, somewhat improved from the prior study. Small bilateral pleural effusions are noted. No pneumothorax is seen. The  cardiomediastinal silhouette is borderline normal in size. No acute osseous abnormalities are seen. There is chronic bony remodeling at the right glenohumeral joint. IMPRESSION: 1. Endotracheal tube seen ending 2-3 cm above the carina. 2. Vascular congestion noted. Right apical and left basilar airspace opacities may reflect pulmonary edema or possibly pneumonia, somewhat improved from the prior study. Small bilateral pleural effusions noted. 3. Chronic bony remodeling at the right glenohumeral joint. Electronically Signed   By: Garald Balding M.D.   On: 09/08/2015 21:11   Dg Chest Port 1 View  09/08/2015  CLINICAL DATA:  Endotracheal tube placement. Central line placement and nasogastric tube placement. Initial encounter. EXAM: PORTABLE CHEST 1 VIEW COMPARISON:  Chest radiograph performed 09/06/2015 FINDINGS: The patient's endotracheal tube is seen ending just above the carina. This could be retracted 2-3 cm. The enteric tube is seen extending below the diaphragm. The right IJ line is noted ending about the distal SVC. Vascular congestion is noted, with central airspace opacities, concerning for pulmonary edema. Small bilateral pleural effusions are suspected. No pneumothorax is seen. The cardiomediastinal silhouette is mildly enlarged. No acute osseous abnormalities are identified. IMPRESSION: 1. Endotracheal tube seen ending just above the carina. This could be retracted 2-3 cm. 2. Enteric tube noted extending below the diaphragm. 3. Right IJ line noted ending about the distal SVC. 4. Vascular congestion and mild cardiomegaly, with central airspace opacities, concerning for pulmonary edema. Suspect small bilateral pleural effusions. These results were called by telephone at the time of interpretation on 09/08/2015 at 6:53 pm to Nursing on [facility], who verbally acknowledged these results. Electronically Signed   By: Garald Balding M.D.   On: 09/08/2015 18:54   Personally viewed.   Telemetry: Sinus rhythm with  second degree heart block type 1.  Personally viewed.   EKG:  1 mm approximately ST segment elevation inferior leads with Q waves inferiorly as well. Personally viewed.  Cardiac Studies:  Cardiac catheterization reviewed 09/08/15: 1. Post Atrio lesion, 100% stenosed. Post intervention with Promus. DES 2.25 mm x 12 mm (2.3 mm), there is a 0% residual stenosis. 2. Prox LAD lesion, 75% stenosed. Dist midLAD lesion, 50% stenosed. 3. Severely elevated LVEDP consistent with acute likely combined systolic and diastolic heart failure 4. Resolved high-grade AV block - temporary wire removed 5. Successful intubation by anesthesiology for acute hypoxic respiratory failure 6. Right IJ central line placed along with a right radial arterial line placed by Dr. Haroldine Laws  Successful emergent PCI on Right Posterior AV groove branch with a DES stent. Successful intubation for hypoxic respiratory failure  Existing calcified proximal to mid LAD lesion on either side of D1 that would likely need to be treated prior to discharge and her level of symptoms. May require rotational atherectomy.  Successful RIJ central line and right radial arterial line placement post emergent PCI.    Plan:  CCU admission. Femoral arterial and venous line removal per protocol following Angiomax discontinuation  Continue can Cangrelor infusion until able to administer Per OGT Plavix 300 mg  [facility] M is been contacted to manage the ventilator - would keep her intubated for likely staged rotational threatening PCI of the LAD  Recheck 2-D echocardiogram, as she clearly has had a change in status from her recent echo at  [place]  She will likely require additional IV Lasix.  She remains limited code. Orders have been changed.  Meds: Scheduled Meds: . antiseptic oral rinse  7 mL Mouth Rinse QID  . aspirin  81 mg Oral Daily  . atorvastatin  80 mg Oral q1800  . atropine      . carvedilol  12.5 mg Oral BID  . chlorhexidine gluconate  (SAGE KIT)  15 mL Mouth Rinse BID  . clopidogrel  75 mg Oral Q breakfast  . enoxaparin (LOVENOX) injection  40 mg Subcutaneous Q24H  . folic acid-pyridoxine-cyancobalamin  1 tablet Oral Daily  . levothyroxine  25 mcg Oral QAC breakfast  . Magnesium  200 mg Oral Daily  . multivitamin  1 tablet Oral Daily  . pantoprazole  40 mg Oral Daily  . potassium chloride  40 mEq Oral Once  . sodium chloride flush  3 mL Intravenous Q12H   Continuous Infusions: . amiodarone 30 mg/hr (09/09/15 0351)  . DOPamine 15 mcg/kg/min (09/09/15 0350)  . fentaNYL infusion INTRAVENOUS 50 mcg/hr (09/08/15 1630)  . midazolam (VERSED) infusion 2 mg/hr (09/09/15 0350)   PRN Meds:.sodium chloride, acetaminophen, ALPRAZolam, nitroGLYCERIN, ondansetron (ZOFRAN) IV, sodium chloride flush, zolpidem  Assessment/Plan:  Principal Problem:   NSTEMI (non-ST elevated myocardial infarction) ([place]) Active Problems:   Hyperlipidemia with target LDL less than 70   Complete heart block (HCC)   Heart block AV second degree   Essential hypertension   Cardiogenic shock (HCC)   Acute respiratory failure with hypoxia ([place])   82 year old with non-ST elevation myocardial infarction, who developed acute respiratory failure secondary to pulmonary edema in the setting of high left ventricular end-diastolic pressure in the Cath Lab on 09/08/15, cardiogenic shock, dopamine transferred from [place].  Non-ST elevation myocardial infarction  - Acute marginal RCA branch successfully stented, Dr. Ellyn Hack  - 75% residual proximal LAD lesion-discussed with Dr. Ellyn Hack. Reasonable plan would be to proceed with complete revascularization, rotational atherectomy of proximal LAD lesion on Tuesday 09/10/15. We will keep her intubated for procedure. If plans change, will notify.  - Dual antiplatelet therapy  - Findings on ECG are suggestive of acute injury inferior, also Q waves inferiorly  - Troponin 15  - Repeating echocardiogram. Previously normal  EF. Hemoglobin stable. Platelets increased.  Acute respiratory failure  - Likely cardiogenic in origin from elevated left ventricular end-diastolic pressure  - Lasix was administered, intubated  - Appreciate critical care medicine currently managing  - Chest x-ray 09/09/15 shows continued pulmonary edema. There is also a persistent retrocardiac consolidation which may represent atelectasis, infiltrate or mass. Appearance is unchanged.  - Plan would be to keep her intubated today and tomorrow to allow for rotational atherectomy procedure of her proximal LAD and the Cath Lab on Tuesday 09/10/15. Case under Dr. Angelena Form. Placed on board.   Cardiogenic shock  - Low-dose dopamine in part being utilized because of sedation  - Urine output 1.6 L yesterday  - With low blood pressures, hold off on aggressive Lasix use  - Resume  Complete heart block/second-degree AV block [place] 1  - Complete heart block, this was seen in the Cath Lab. Temporary wire was discontinued.  - Hopefully with PCI to RCA acute marginal branch, this will decrease risk of future complete AV block  - Currently on telemetry she is demonstrating secondary heart block type I.  - Concerned about amiodarone use in this setting worsening overall AV block. Will stop amiodarone. Holding carvedilol as well.  Hypokalemia  - 3.2, replete  CODE STATUS  - Partial code currently area was previously DO NOT RESUSCITATE but this was partially reversed because of acute pulmonary edema in Cath Lab setting.  - When she is stable for extubation, CODE STATUS should resume to DO NOT RESUSCITATE.  - Patient's power of attorney are Cherlynn Kaiser and Jan. Family wishes for short-term intubation only.  Critical care time 35 minutes spent with patient, extensive data review, complex multisystem organ failure, review with care teams. Spoke with Cherlynn Kaiser, son.   Candee Furbish 09/09/2015, 8:05 AM

## 2015-09-09 NOTE — Progress Notes (Signed)
Pt to cath lab via bed, monitored by RN, telemetry on, RT managing vent. Bedside report given to cath lab staff.

## 2015-09-09 NOTE — Consult Note (Addendum)
PULMONARY / CRITICAL CARE MEDICINE   Name: Teresa Franklin MRN: [id number] DOB: [birth year]    ADMISSION DATE:  09/08/2015 CONSULTATION DATE:  09/08/2015  REFERRING MD:  Dr. Rebbeca Paul, cardiology.  CHIEF COMPLAINT:  Respiratory failure  HISTORY OF PRESENT ILLNESS:   82 year old female with PMH of HTN and remote ICH who developed a-fib and was taken to the cath lab emergently.  Occluded distal RCA was noted.  During procedure patient developed pulmonary edema and was intubated in the cath lab.  PCCM was called to assist with vent management.    SUBJECTIVE:  On pressors, to cath lab planned in am   VITAL SIGNS: BP 103/61 mmHg  Pulse 31  Temp(Src) 99.9 F (37.7 C) (Oral)  Resp 20  Ht 5\' 4"  (1.626 m)  Wt 63 kg (138 lb 14.2 oz)  BMI 23.83 kg/m2  SpO2 98%  HEMODYNAMICS: CVP:  [9 mmHg] 9 mmHg  VENTILATOR SETTINGS: Vent Mode:  [-] PRVC FiO2 (%):  [60 %-100 %] 60 % Set Rate:  [16 bmp-20 bmp] 20 bmp Vt Set:  [460 mL] 460 mL PEEP:  [5 cmH20] 5 cmH20 Plateau Pressure:  [19 cmH20-23 cmH20] 19 cmH20  INTAKE / OUTPUT: I/O last 3 completed shifts: In: 795.6 [I.V.:735.6; NG/GT:60] Out: 1625 [Urine:1625]  PHYSICAL EXAMINATION: General:  Elderly appearing female, NAD. Neuro:  Sedated rass 1, fc HEENT:  [place]/AT, PERRL Cardiovascular:  RRR, Nl S1/S2, -M/R/G. Lungs:  Diffuse ronchi Abdomen:  Soft, NT, ND and +BS. Musculoskeletal:  -edema and -tenderness. Skin:  Thin but intact.  LABS:  BMET  Recent Labs Lab 09/06/15 1245 09/07/15 0522 09/09/15 0350  NA 140 137 136  K 3.9 3.8 3.2*  CL 104 107 108  CO2 27 24 21*  BUN 14 16 13   CREATININE 0.77 0.71 0.89  GLUCOSE 134* 133* 151*    Electrolytes  Recent Labs Lab 09/06/15 1245 09/07/15 0522 09/09/15 0350  CALCIUM 9.9 8.6* 8.4*  MG  --   --  1.5*  PHOS  --   --  2.2*    CBC  Recent Labs Lab 09/08/15 0611 09/08/15 1159 09/09/15 0350  WBC 7.3 9.7 11.5*  HGB 10.2* 9.8* 10.2*  HCT 29.7* 28.9* 29.7*  PLT 69* 76*  116*    Coag's  Recent Labs Lab 09/06/15 1803  APTT 148*  INR 1.18    Sepsis Markers No results for input(s): LATICACIDVEN, PROCALCITON, O2SATVEN in the last 168 hours.  ABG  Recent Labs Lab 09/08/15 1651 09/08/15 1843 09/09/15 0404  PHART 7.270* 7.359 7.475*  PCO2ART 41.2 35.4 28.7*  PO2ART 95.0 60.0* 82.5    Liver Enzymes  Recent Labs Lab 09/05/15 2058 09/06/15 1245 09/09/15 0350  AST 58* 140* 80*  ALT 18 27 46  ALKPHOS 64 64 79  BILITOT 0.8 0.7 1.0  ALBUMIN 4.3 4.5 2.6*    Cardiac Enzymes  Recent Labs Lab 09/06/15 1803 09/06/15 2216 09/07/15 0522  TROPONINI 10.87* 10.67* 14.97*    Glucose  Recent Labs Lab 09/08/15 1210  GLUCAP 126*    Imaging Dg Chest Port 1 View  09/09/2015  CLINICAL DATA:  82 year old female with endotracheal tube in place. Weakness. Hypertension. Myocardial infarction. Subsequent encounter. EXAM: PORTABLE CHEST 1 VIEW COMPARISON:  09/08/2015. FINDINGS: Endotracheal tube tip is 2.8 cm above the carina. Right central line tip distal superior vena cava level. Nasogastric tube courses below the diaphragm. Tip is not included on the present exam. Pulmonary vascular congestion/ mild pulmonary edema possibly with posteriorly layering pleural  fluid. Persistent consolidation retrocardiac region may represent atelectasis, infiltrate or mass. Appearance unchanged. Prominence of right hilar region may represent pulmonary vasculature. Attention to this on follow-up. Calcified aorta. Prominent right shoulder joint degenerative changes. No gross pneumothorax. IMPRESSION: Pulmonary vascular congestion/ pulmonary edema. Consolidation retrocardiac region. Appearance unchanged. Prominence right hilar region may represent pulmonary artery. Attention to this on follow-up. Electronically Signed   By: Genia Del M.D.   On: 09/09/2015 07:08   Dg Chest Port 1 View  09/08/2015  CLINICAL DATA:  Endotracheal tube placement.  Initial encounter. EXAM:  PORTABLE CHEST 1 VIEW COMPARISON:  Chest radiograph performed earlier today at 6:35 p.m. FINDINGS: The patient's endotracheal tube is seen ending 2-3 cm above the carina. An enteric tube is noted extending below the diaphragm. The right IJ line is noted ending about the distal SVC. Vascular congestion is noted. Right apical and left basilar airspace opacities may reflect pulmonary edema or possibly pneumonia, somewhat improved from the prior study. Small bilateral pleural effusions are noted. No pneumothorax is seen. The cardiomediastinal silhouette is borderline normal in size. No acute osseous abnormalities are seen. There is chronic bony remodeling at the right glenohumeral joint. IMPRESSION: 1. Endotracheal tube seen ending 2-3 cm above the carina. 2. Vascular congestion noted. Right apical and left basilar airspace opacities may reflect pulmonary edema or possibly pneumonia, somewhat improved from the prior study. Small bilateral pleural effusions noted. 3. Chronic bony remodeling at the right glenohumeral joint. Electronically Signed   By: Garald Balding M.D.   On: 09/08/2015 21:11   Dg Chest Port 1 View  09/08/2015  CLINICAL DATA:  Endotracheal tube placement. Central line placement and nasogastric tube placement. Initial encounter. EXAM: PORTABLE CHEST 1 VIEW COMPARISON:  Chest radiograph performed 09/06/2015 FINDINGS: The patient's endotracheal tube is seen ending just above the carina. This could be retracted 2-3 cm. The enteric tube is seen extending below the diaphragm. The right IJ line is noted ending about the distal SVC. Vascular congestion is noted, with central airspace opacities, concerning for pulmonary edema. Small bilateral pleural effusions are suspected. No pneumothorax is seen. The cardiomediastinal silhouette is mildly enlarged. No acute osseous abnormalities are identified. IMPRESSION: 1. Endotracheal tube seen ending just above the carina. This could be retracted 2-3 cm. 2. Enteric  tube noted extending below the diaphragm. 3. Right IJ line noted ending about the distal SVC. 4. Vascular congestion and mild cardiomegaly, with central airspace opacities, concerning for pulmonary edema. Suspect small bilateral pleural effusions. These results were called by telephone at the time of interpretation on 09/08/2015 at 6:53 pm to Nursing on [facility], who verbally acknowledged these results. Electronically Signed   By: Garald Balding M.D.   On: 09/08/2015 18:54     STUDIES:    CULTURES: None  ANTIBIOTICS: None  SIGNIFICANT EVENTS: 6/18 flash pulmonary edema, intubated.  LINES/TUBES: ETT 6/18>>> R IJ 6/18>>> R radial a-line 6/18>>> R femoral arterial and venous sheaths 6/18>>>  DISCUSSION: 82 year old female with HTN and CAD presenting to the ICU after being intubated in the cath lab for flash pulmonary edema.  Family wishes for short term intubation only.  ASSESSMENT / PLAN:  PULMONARY A: Acute respiratory failure due to acute pulmonary edema P:   - lasix to neg balance, agree, and was neg 900 cc last 24 hr -would repeat pcxr in am for volume status and ett -rate to 14 -would NOT do weaning with active ischemia , EF preserved stent needed -to goal 50% if able  CARDIOVASCULAR A:  Cardiogenic shock NSTEMI P:  - Dopamine for BP support, would avoid over 8-10 mics as all alpha at that dose -understand beta affect as HB in cath lab - Cards managing the CAD disease component. - to cath lab in am  - Hold home anti-HTN -repeat echo to assess, was done  RENAL A:   Hypok, hypophos, hypomag P:   - supp k , phos, mag -lasix per cards -chem in am   GASTROINTESTINAL A:   No active issues. P:   - start feeds npo midnight -ppi  HEMATOLOGIC A:   Thrombocytopenia. Anemia P:  - CBC in AM. -plat up, lovenox noted, crt in am   INFECTIOUS A:   No evidence of active infection. P:   - Monitor WBC and fever curves.  ENDOCRINE A:   Hypothyroidism  P:    Cont replacement  NEUROLOGIC A:   Acute encephalopathy  P:   RASS goal: -2 PAD protocol  Versed and fentanyl drips Avoid versed drip if able, add home ativan Would be a good mIND candidate  FAMILY  - Updates: No family bedside.  - Inter-disciplinary family meet or Palliative Care meeting due by:  7/25  Ccm time 30 min   Lavon Paganini. Titus Mould, MD, [place] Pgr: [facility]

## 2015-09-09 NOTE — Progress Notes (Signed)
Initial Nutrition Assessment  INTERVENTION:   Initiate Vital AF 1.2 @ 50 ml/hr via OG tube  Tube feeding regimen provides 1440 kcal (103% of needs), 97 grams of protein, and 973 ml of H2O.   Monitor magnesium, potassium, and phosphorus daily for at least 3 days, MD to replete as needed, as pt is at risk for refeeding syndrome given low PO4 and K+ on admission.   NUTRITION DIAGNOSIS:   Inadequate oral intake related to inability to eat as evidenced by NPO status.  GOAL:   Patient will meet greater than or equal to 90% of their needs  MONITOR:   Vent status, Labs, TF tolerance  REASON FOR ASSESSMENT:   Consult, Ventilator Enteral/tube feeding initiation and management  ASSESSMENT:   82 year old female with HTN and CAD presenting to the ICU after being intubated in the cath lab for flash pulmonary edema.  Plan for cath lab 6/20 will be NPO after midnight.   Patient is currently intubated on ventilator support MV: 8.7 L/min Temp (24hrs), Avg:98.5 F (36.9 C), Min:96.4 F (35.8 C), Max:99.9 F (37.7 C)  Medications reviewed and include: folic acid, magnesium, MVI, Na phosphate, KCl Labs reviewed: K+ 3.2, PO4 2.2, magnesium 1.5 Nutrition-Focused physical exam completed. Findings are no fat depletion, no muscle depletion, and mild edema.   OG tube: below diaphragm on chest x ray No family present to obtain nutrition hx.   Diet Order:    NPO  Skin:  Reviewed, no issues  Last BM:  6/18  Height:   Ht Readings from Last 1 Encounters:  09/08/15 5\' 4"  (1.626 m)    Weight:   Wt Readings from Last 1 Encounters:  09/09/15 138 lb 14.2 oz (63 kg)    Ideal Body Weight:  54.5 kg  BMI:  Body mass index is 23.83 kg/(m^2).  Estimated Nutritional Needs:   Kcal:  1392  Protein:  85-95 grams  Fluid:  > 1.5 L/day  EDUCATION NEEDS:   No education needs identified at this time  [place], [place], [place] Pager [phone] After Hours Pager

## 2015-09-09 NOTE — Interval H&P Note (Signed)
History and Physical Interval Note:  09/09/2015 3:23 PM  Teresa Franklin  has presented today for surgery, with the diagnosis of hb  The various methods of treatment have been discussed with the patient and family. After consideration of risks, benefits and other options for treatment, the patient has consented to  Procedure(s): Temporary Wire (N/A) as a surgical intervention .  The patient's history has been reviewed, patient examined, no change in status, stable for surgery.  I have reviewed the patient's chart and labs.  Questions were answered to the patient's satisfaction.     Quay Burow

## 2015-09-09 NOTE — Care Management Important Message (Signed)
Important Message  Patient Details  Name: Teresa Franklin MRN: [id number] Date of Birth: January 13, 1931   Medicare Important Message Given:  Yes    Loann Quill 09/09/2015, 10:03 AM

## 2015-09-09 NOTE — Progress Notes (Addendum)
    Began to demonstrate 2:1 AVB with heart rate of 45bpm. This correlated with BP drop to 80 SBP.   Coreg 12.5mg  has been DC'd. AMIO is off from this AM  Dopamine is 64mcg. Increased. Feet cold.  Gave 221ml bolus as well as 1amp Atropine.   With acute hypotension in the setting of worsening conduction, will send emergently to cardiac cath lab for temp wire placement. Discussed with Dr. Gwenlyn Found.  Spoke to son, Cherlynn Kaiser after unable to reach daughter Thayer Headings. He consented for replacement of temp venous pacing wire. He understood procedure since wire was placed recently by Dr. Rockey Situ.   Critical care time 71min - patient, tele, data, Dr. Gwenlyn Found, acute decompensation hemodynamically, NSTEMI, critically ill.   Candee Furbish, MD

## 2015-09-10 ENCOUNTER — Encounter (HOSPITAL_COMMUNITY)
Admission: EM | Disposition: A | Discharge status: Skilled Nursing Facility | Payer: Self-pay | Source: Ambulatory Visit | Attending: Cardiology

## 2015-09-10 ENCOUNTER — Ambulatory Visit (HOSPITAL_COMMUNITY): Admission: RE | Admit: 2015-09-10 | Payer: Medicare Other | Source: Ambulatory Visit | Admitting: Cardiovascular Disease

## 2015-09-10 ENCOUNTER — Inpatient Hospital Stay (HOSPITAL_COMMUNITY): Payer: Medicare Other

## 2015-09-10 LAB — CBC WITH DIFFERENTIAL/PLATELET
Basophils Absolute: 0 10*3/uL (ref 0.0–0.1)
Basophils Relative: 0 %
EOS PCT: 0 %
Eosinophils Absolute: 0 10*3/uL (ref 0.0–0.7)
HCT: 27.7 % — ABNORMAL LOW (ref 36.0–46.0)
Hemoglobin: 9 g/dL — ABNORMAL LOW (ref 12.0–15.0)
LYMPHS ABS: 0.6 10*3/uL — AB (ref 0.7–4.0)
LYMPHS PCT: 8 %
MCH: 30.2 pg (ref 26.0–34.0)
MCHC: 32.5 g/dL (ref 30.0–36.0)
MCV: 93 fL (ref 78.0–100.0)
MONO ABS: 0.8 10*3/uL (ref 0.1–1.0)
Monocytes Relative: 11 %
Neutro Abs: 6.3 10*3/uL (ref 1.7–7.7)
Neutrophils Relative %: 81 %
PLATELETS: 108 10*3/uL — AB (ref 150–400)
RBC: 2.98 MIL/uL — AB (ref 3.87–5.11)
RDW: 14.4 % (ref 11.5–15.5)
WBC: 7.8 10*3/uL (ref 4.0–10.5)

## 2015-09-10 LAB — POCT I-STAT 3, ART BLOOD GAS (G3+)
Acid-base deficit: 5 mmol/L — ABNORMAL HIGH (ref 0.0–2.0)
Bicarbonate: 20.1 mEq/L (ref 20.0–24.0)
O2 SAT: 93 %
PCO2 ART: 36.3 mmHg (ref 35.0–45.0)
PO2 ART: 71 mmHg — AB (ref 80.0–100.0)
TCO2: 21 mmol/L (ref 0–100)
pH, Arterial: 7.352 (ref 7.350–7.450)

## 2015-09-10 LAB — PHOSPHORUS
PHOSPHORUS: 2.1 mg/dL — AB (ref 2.5–4.6)
Phosphorus: 2.4 mg/dL — ABNORMAL LOW (ref 2.5–4.6)

## 2015-09-10 LAB — COMPREHENSIVE METABOLIC PANEL
ALK PHOS: 71 U/L (ref 38–126)
ALT: 31 U/L (ref 14–54)
ANION GAP: 5 (ref 5–15)
AST: 46 U/L — ABNORMAL HIGH (ref 15–41)
Albumin: 2.2 g/dL — ABNORMAL LOW (ref 3.5–5.0)
BUN: 25 mg/dL — ABNORMAL HIGH (ref 6–20)
CALCIUM: 8.3 mg/dL — AB (ref 8.9–10.3)
CHLORIDE: 110 mmol/L (ref 101–111)
CO2: 21 mmol/L — ABNORMAL LOW (ref 22–32)
CREATININE: 1.02 mg/dL — AB (ref 0.44–1.00)
GFR, EST AFRICAN AMERICAN: 58 mL/min — AB (ref >60)
GFR, EST NON AFRICAN AMERICAN: 50 mL/min — AB (ref >60)
Glucose, Bld: 160 mg/dL — ABNORMAL HIGH (ref 65–99)
Potassium: 4 mmol/L (ref 3.5–5.1)
Sodium: 136 mmol/L (ref 135–145)
Total Bilirubin: 0.9 mg/dL (ref 0.3–1.2)
Total Protein: 5 g/dL — ABNORMAL LOW (ref 6.5–8.1)

## 2015-09-10 LAB — GLUCOSE, CAPILLARY
GLUCOSE-CAPILLARY: 138 mg/dL — AB (ref 65–99)
GLUCOSE-CAPILLARY: 112 mg/dL — AB (ref 65–99)
Glucose-Capillary: 140 mg/dL — ABNORMAL HIGH (ref 65–99)
Glucose-Capillary: 122 mg/dL — ABNORMAL HIGH (ref 65–99)

## 2015-09-10 LAB — MAGNESIUM
Magnesium: 2.7 mg/dL — ABNORMAL HIGH (ref 1.7–2.4)
Magnesium: 2.1 mg/dL (ref 1.7–2.4)

## 2015-09-10 SURGERY — CORONARY ATHERECTOMY
Anesthesia: LOCAL

## 2015-09-10 MED ORDER — CETYLPYRIDINIUM CHLORIDE 0.05 % MT LIQD
7.0000 mL | Freq: Two times a day (BID) | OROMUCOSAL | Status: DC
  Administered 2015-09-10 – 2015-09-18 (×12): 7 mL via OROMUCOSAL

## 2015-09-10 MED ORDER — FUROSEMIDE 10 MG/ML IJ SOLN
40.0000 mg | Freq: Two times a day (BID) | INTRAMUSCULAR | Status: AC
  Administered 2015-09-10 (×2): 40 mg via INTRAVENOUS
  Filled 2015-09-10 (×2): qty 4

## 2015-09-10 MED ORDER — POTASSIUM CHLORIDE 20 MEQ/15ML (10%) PO SOLN
20.0000 meq | Freq: Once | ORAL | Status: AC
  Administered 2015-09-10: 20 meq
  Filled 2015-09-10: qty 15

## 2015-09-10 NOTE — Consult Note (Addendum)
PULMONARY / CRITICAL CARE MEDICINE   Name: Teresa Franklin MRN: [id number] DOB: 1930-07-11    ADMISSION DATE:  09/08/2015 CONSULTATION DATE:  09/08/2015  REFERRING MD:  Dr. Rebbeca Paul, cardiology.  CHIEF COMPLAINT:  Respiratory failure  HISTORY OF PRESENT ILLNESS:   82 year old female with PMH of HTN and remote ICH who developed a-fib and was taken to the cath lab emergently.  Occluded distal RCA was noted.  During procedure patient developed pulmonary edema and was intubated in the cath lab.  PCCM was called to assist with vent management.    SUBJECTIVE:  Lower  dopamine  VITAL SIGNS: BP 103/50 mmHg  Pulse 68  Temp(Src) 100.1 F (37.8 C) (Oral)  Resp 18  Ht 5\' 4"  (1.626 m)  Wt 62.4 kg (137 lb 9.1 oz)  BMI 23.60 kg/m2  SpO2 99%  HEMODYNAMICS: CVP:  [6 mmHg] 6 mmHg  VENTILATOR SETTINGS: Vent Mode:  [-] PRVC FiO2 (%):  [50 %-60 %] 60 % Set Rate:  [14 bmp-20 bmp] 14 bmp Vt Set:  [460 mL] 460 mL PEEP:  [5 cmH20] 5 cmH20 Plateau Pressure:  [17 cmH20-20 cmH20] 17 cmH20  INTAKE / OUTPUT: I/O last 3 completed shifts: In: 2864.3 [I.V.:1763.4; NG/GT:750.8; IV Piggyback:350] Out: 14 [Urine:895]  PHYSICAL EXAMINATION: General:  Elderly appearing female, NAD. Neuro:  Sedated rass 1, fc HEENT:  PERRL Cardiovascular:  RRR, Nl S1/S2  M Lungs:  coarse Abdomen:  Soft, NT, ND and +BS. Musculoskeletal:  -edema and -tenderness Skin:  No rash  LABS:  BMET  Recent Labs Lab 09/07/15 0522 09/08/15 1415 09/09/15 0350 09/10/15 0357  NA 137 137 136 136  K 3.8 3.8 3.2* 4.0  CL 107 104 108 110  CO2 24  --  21* 21*  BUN 16 14 13  25*  CREATININE 0.71 0.90 0.89 1.02*  GLUCOSE 133* 193* 151* 160*    Electrolytes  Recent Labs Lab 09/07/15 0522  09/09/15 0350 09/09/15 1300 09/09/15 2120 09/10/15 0357  CALCIUM 8.6*  --  8.4*  --   --  8.3*  MG  --   < > 1.5* 1.6* 3.3* 2.7*  PHOS  --   < > 2.2* 2.4* 3.0 2.4*  < > = values in this interval not displayed.  CBC  Recent  Labs Lab 09/08/15 1159 09/08/15 1415 09/09/15 0350 09/10/15 0357  WBC 9.7  --  11.5* 7.8  HGB 9.8* 10.2* 10.2* 9.0*  HCT 28.9* 30.0* 29.7* 27.7*  PLT 76*  --  116* 108*    Coag's  Recent Labs Lab 09/06/15 1803  APTT 148*  INR 1.18    Sepsis Markers No results for input(s): LATICACIDVEN, PROCALCITON, O2SATVEN in the last 168 hours.  ABG  Recent Labs Lab 09/08/15 1651 09/08/15 1843 09/09/15 0404  PHART 7.270* 7.359 7.475*  PCO2ART 41.2 35.4 28.7*  PO2ART 95.0 60.0* 82.5    Liver Enzymes  Recent Labs Lab 09/06/15 1245 09/09/15 0350 09/10/15 0357  AST 140* 80* 46*  ALT 27 46 31  ALKPHOS 64 79 71  BILITOT 0.7 1.0 0.9  ALBUMIN 4.5 2.6* 2.2*    Cardiac Enzymes  Recent Labs Lab 09/06/15 1803 09/06/15 2216 09/07/15 0522  TROPONINI 10.87* 10.67* 14.97*    Glucose  Recent Labs Lab 09/08/15 1210 09/09/15 1252 09/09/15 1642 09/09/15 2033 09/09/15 2343 09/10/15 0733  GLUCAP 126* 157* 194* 154* 137* 140*    Imaging Dg Chest Port 1 View  09/10/2015  CLINICAL DATA: MI. EXAM: PORTABLE CHEST 1 VIEW COMPARISON:  09/09/2015. FINDINGS: Endotracheal tube, NG tube, right IJ line stable position. Heart size normal. Bilateral pulmonary infiltrates, right side greater than left are noted. These findings are consistent with pulmonary edema and/or pneumonia. Small bilateral pleural effusions are noted. No pneumothorax . Severe degenerative changes both shoulders. IMPRESSION: 1. Lines and tubes in stable position. 2. Bilateral pulmonary infiltrates, right side greater than left. These findings are consistent with pulmonary edema and/or pneumonia. Small bilateral pleural effusions are also noted. Electronically Signed   By: Marcello Moores  Register   On: 09/10/2015 07:14     STUDIES:    CULTURES: None  ANTIBIOTICS: None  SIGNIFICANT EVENTS: 6/18 flash pulmonary edema, intubated.  LINES/TUBES: ETT 6/18>>> R IJ 6/18>>> R radial a-line 6/18>>> R femoral arterial  and venous sheaths 6/18>>>  DISCUSSION: 82 year old female with HTN and CAD presenting to the ICU after being intubated in the cath lab for flash pulmonary edema.  Family wishes for short term intubation only.  ASSESSMENT / PLAN:  PULMONARY A: Acute respiratory failure due to acute pulmonary edema P:   - not to cath lab, will wean cpap 5 ps 5, okay by cards to wean aggressive to extubate if able -remains on 60% , it looks likw we can reduce to 50%, if able then to peep 8, weaning cpap 8 -pcxr worsening effusion, edema, lasix add needed  CARDIOVASCULAR A:  Cardiogenic shock NSTEMI P:  - Dopamine for brady treatment to goal 5-10 -not to lab, ef preserved -lasix  RENAL A:   Hypok, hypophos, hypomag P:   - supp k mild with addition lasix -lasix add -chem in am   GASTROINTESTINAL A:   No active issues. P:   - TF re start -ppi  HEMATOLOGIC A:   Thrombocytopenia. Anemia, dilution P:  - CBC in AM. -lovenox  INFECTIOUS A:   No evidence of active infection. P:   - Monitor WBC and fever curves.  ENDOCRINE A:   Hypothyroidism  P:   Cont replacement  NEUROLOGIC A:   Acute encephalopathy  P:   RASS goal: -1 PAD protocol  fent drip, wua  FAMILY  - Updates: No family bedside.  - Inter-disciplinary family meet or Palliative Care meeting due by:  7/25  Ccm time 30 min   Lavon Paganini. Titus Mould, MD, FACP Pgr: [facility]     I have had extensive discussions with family sons x 2. We discussed patients current circumstances and organ failures. We also discussed patient's prior wishes under circumstances such as this. Family has decided to NOT perform resuscitation if arrest but to continue current medical support for now. Want extubation and no reintubation. Comfort if fails  Lavon Paganini. Titus Mould, MD, [place] Pgr: [facility]

## 2015-09-10 NOTE — Progress Notes (Addendum)
Cardiologist: Dr. Fletcher Anon Subjective:  Awake on vent.  On 6/19 - needed temp wire replaced, 2:1 AVB with HR 45 and decreased BP.   Overnight, pacing intermittently. Now 46.  Off AMIO, OFF Coreg 12.5   Previous subarachnoid hemorrhage in March 2015 in the setting of fall with no prior cardiac history up until this admission who has been under increased stress of late with the poor health of her husband and 2 sisters. She developed second-degree heart block, ST segment changes, cardiogenic shock, pulmonary edema and was sent from [facility] for emergent cardiac catheterization.  Echo earlier on this admission showed an EF of 60-65%  Objective:  Vital Signs in the last 24 hours: Temp:  [98.6 F (37 C)-100.1 F (37.8 C)] 100.1 F (37.8 C) (06/20 0734) Pulse Rate:  [0-119] 68 (06/20 0818) Resp:  [0-21] 18 (06/20 0818) BP: (84-134)/(41-67) 103/50 mmHg (06/20 0734) SpO2:  [0 %-100 %] 99 % (06/20 0818) Arterial Line BP: (66-125)/(30-61) 100/44 mmHg (06/20 0800) FiO2 (%):  [50 %-60 %] 60 % (06/20 0818) Weight:  [137 lb 9.1 oz (62.4 kg)] 137 lb 9.1 oz (62.4 kg) (06/20 0447)  Intake/Output from previous day: 06/19 0701 - 06/20 0700 In: 2252.4 [I.V.:1211.5; NG/GT:690.8; IV Piggyback:350] Out: 13 [Urine:470]   Physical Exam: General: Elderly, intubated, well nourished, comfortable on vent Head:  Normocephalic and atraumatic. ET tube Lungs: Clear to auscultation and percussion. Heart: Normal S1 and S2, occasional ectopy.  No murmur, rubs or gallops.  Abdomen: soft, non-tender, positive bowel sounds. Extremities: No clubbing or cyanosis. No edema. Soft restraints in place Neurologic: Alert.    Lab Results:  Recent Labs  09/09/15 0350 09/10/15 0357  WBC 11.5* 7.8  HGB 10.2* 9.0*  PLT 116* 108*    Recent Labs  09/09/15 0350 09/10/15 0357  NA 136 136  K 3.2* 4.0  CL 108 110  CO2 21* 21*  GLUCOSE 151* 160*  BUN 13 25*  CREATININE 0.89 1.02*   No results for  input(s): TROPONINI in the last 72 hours.  Invalid input(s): CK, MB Hepatic Function Panel  Recent Labs  09/10/15 0357  PROT 5.0*  ALBUMIN 2.2*  AST 46*  ALT 31  ALKPHOS 71  BILITOT 0.9    Recent Labs  09/09/15 0350  CHOL 114   No results for input(s): PROTIME in the last 72 hours.  Imaging: Dg Chest Port 1 View  09/10/2015  CLINICAL DATA: MI. EXAM: PORTABLE CHEST 1 VIEW COMPARISON:  09/09/2015. FINDINGS: Endotracheal tube, NG tube, right IJ line stable position. Heart size normal. Bilateral pulmonary infiltrates, right side greater than left are noted. These findings are consistent with pulmonary edema and/or pneumonia. Small bilateral pleural effusions are noted. No pneumothorax . Severe degenerative changes both shoulders. IMPRESSION: 1. Lines and tubes in stable position. 2. Bilateral pulmonary infiltrates, right side greater than left. These findings are consistent with pulmonary edema and/or pneumonia. Small bilateral pleural effusions are also noted. Electronically Signed   By: Marcello Moores  Register   On: 09/10/2015 07:14   Dg Chest Port 1 View  09/09/2015  CLINICAL DATA:  82 year old female with endotracheal tube in place. Weakness. Hypertension. Myocardial infarction. Subsequent encounter. EXAM: PORTABLE CHEST 1 VIEW COMPARISON:  09/08/2015. FINDINGS: Endotracheal tube tip is 2.8 cm above the carina. Right central line tip distal superior vena cava level. Nasogastric tube courses below the diaphragm. Tip is not included on the present exam. Pulmonary vascular congestion/ mild pulmonary edema possibly with posteriorly layering pleural fluid. Persistent  consolidation retrocardiac region may represent atelectasis, infiltrate or mass. Appearance unchanged. Prominence of right hilar region may represent pulmonary vasculature. Attention to this on follow-up. Calcified aorta. Prominent right shoulder joint degenerative changes. No gross pneumothorax. IMPRESSION: Pulmonary vascular  congestion/ pulmonary edema. Consolidation retrocardiac region. Appearance unchanged. Prominence right hilar region may represent pulmonary artery. Attention to this on follow-up. Electronically Signed   By: Genia Del M.D.   On: 09/09/2015 07:08   Dg Chest Port 1 View  09/08/2015  CLINICAL DATA:  Endotracheal tube placement.  Initial encounter. EXAM: PORTABLE CHEST 1 VIEW COMPARISON:  Chest radiograph performed earlier today at 6:35 p.m. FINDINGS: The patient's endotracheal tube is seen ending 2-3 cm above the carina. An enteric tube is noted extending below the diaphragm. The right IJ line is noted ending about the distal SVC. Vascular congestion is noted. Right apical and left basilar airspace opacities may reflect pulmonary edema or possibly pneumonia, somewhat improved from the prior study. Small bilateral pleural effusions are noted. No pneumothorax is seen. The cardiomediastinal silhouette is borderline normal in size. No acute osseous abnormalities are seen. There is chronic bony remodeling at the right glenohumeral joint. IMPRESSION: 1. Endotracheal tube seen ending 2-3 cm above the carina. 2. Vascular congestion noted. Right apical and left basilar airspace opacities may reflect pulmonary edema or possibly pneumonia, somewhat improved from the prior study. Small bilateral pleural effusions noted. 3. Chronic bony remodeling at the right glenohumeral joint. Electronically Signed   By: Garald Balding M.D.   On: 09/08/2015 21:11   Dg Chest Port 1 View  09/08/2015  CLINICAL DATA:  Endotracheal tube placement. Central line placement and nasogastric tube placement. Initial encounter. EXAM: PORTABLE CHEST 1 VIEW COMPARISON:  Chest radiograph performed 09/06/2015 FINDINGS: The patient's endotracheal tube is seen ending just above the carina. This could be retracted 2-3 cm. The enteric tube is seen extending below the diaphragm. The right IJ line is noted ending about the distal SVC. Vascular congestion is  noted, with central airspace opacities, concerning for pulmonary edema. Small bilateral pleural effusions are suspected. No pneumothorax is seen. The cardiomediastinal silhouette is mildly enlarged. No acute osseous abnormalities are identified. IMPRESSION: 1. Endotracheal tube seen ending just above the carina. This could be retracted 2-3 cm. 2. Enteric tube noted extending below the diaphragm. 3. Right IJ line noted ending about the distal SVC. 4. Vascular congestion and mild cardiomegaly, with central airspace opacities, concerning for pulmonary edema. Suspect small bilateral pleural effusions. These results were called by telephone at the time of interpretation on 09/08/2015 at 6:53 pm to Nursing on [facility], who verbally acknowledged these results. Electronically Signed   By: Garald Balding M.D.   On: 09/08/2015 18:54   Personally viewed.   Telemetry: Sinus rhythm with second degree heart block type 1.  Personally viewed.   EKG:  1 mm approximately ST segment elevation inferior leads with Q waves inferiorly as well. Personally viewed.  Cardiac Studies:  Cardiac catheterization reviewed 09/08/15: 1. Post Atrio lesion, 100% stenosed. Post intervention with Promus. DES 2.25 mm x 12 mm (2.3 mm), there is a 0% residual stenosis. 2. Prox LAD lesion, 75% stenosed. Dist midLAD lesion, 50% stenosed. 3. Severely elevated LVEDP consistent with acute likely combined systolic and diastolic heart failure 4. Resolved high-grade AV block - temporary wire removed 5. Successful intubation by anesthesiology for acute hypoxic respiratory failure 6. Right IJ central line placed along with a right radial arterial line placed by Dr. Haroldine Laws  Successful emergent PCI  on Right Posterior AV groove branch with a DES stent. Successful intubation for hypoxic respiratory failure  Meds: Scheduled Meds: . antiseptic oral rinse  7 mL Mouth Rinse 10 times per day  . aspirin  81 mg Oral Daily  . atorvastatin  80 mg Oral  q1800  . chlorhexidine gluconate (SAGE KIT)  15 mL Mouth Rinse BID  . clopidogrel  75 mg Oral Q breakfast  . enoxaparin (LOVENOX) injection  40 mg Subcutaneous Q24H  . folic acid-pyridoxine-cyancobalamin  1 tablet Oral Daily  . levothyroxine  25 mcg Oral QAC breakfast  . magnesium oxide  200 mg Oral Daily  . multivitamin  1 tablet Oral Daily  . pantoprazole sodium  40 mg Per Tube Daily  . potassium chloride  40 mEq Oral Daily  . sodium chloride flush  3 mL Intravenous Q12H   Continuous Infusions: . sodium chloride 1 mL/kg/hr (09/10/15 0263)  . DOPamine 12 mcg/kg/min (09/10/15 0500)  . feeding supplement (VITAL AF 1.2 CAL) 1,000 mL (09/09/15 1835)  . fentaNYL infusion INTRAVENOUS 50 mcg/hr (09/09/15 2000)  . midazolam (VERSED) infusion Stopped (09/09/15 1100)   PRN Meds:.sodium chloride, acetaminophen, LORazepam, nitroGLYCERIN, ondansetron (ZOFRAN) IV, sodium chloride flush, zolpidem  Assessment/Plan:  Principal Problem:   NSTEMI (non-ST elevated myocardial infarction) ([place]) Active Problems:   Hyperlipidemia with target LDL less than 70   Complete heart block (HCC)   Heart block AV second degree   Essential hypertension   Cardiogenic shock (HCC)   Acute respiratory failure with hypoxia (HCC)   Acute respiratory failure ([place])   82 year old with non-ST elevation myocardial infarction, who developed acute respiratory failure secondary to pulmonary edema in the setting of high left ventricular end-diastolic pressure in the Cath Lab on 09/08/15, cardiogenic shock, dopamine transferred from [place].  Non-ST elevation myocardial infarction  - Acute marginal RCA branch successfully stented, Dr. Ellyn Hack  - 75% residual proximal LAD lesion-discussed with Dr. Ellyn Hack. Discussed as well with Dr. Angelena Form and Carlene Coria. At this time, will forgo PCI to LAD (distal vessel fairly small and if symptoms develop as outpatient, consider rotational atherectomy of proximal LAD. However, this would  jail some significant diagonal branches as well.  We will keep her intubated for procedure. If plans change, will notify.  - Dual antiplatelet therapy  - Findings on ECG are suggestive of acute injury inferior, also Q waves inferiorly  - Troponin 15  - Repeat echocardiogram normal EF, reassuring. Previously normal EF. Hemoglobin mild decrease.   Acute respiratory failure  - Likely cardiogenic in origin from elevated left ventricular end-diastolic pressure, in setting ischemia.   - Lasix was administered, intubated. Will give 40 BID today.   - Appreciate critical care medicine discussed with CCM team.   - Chest x-ray 09/10/15 shows continued continued pulmonary edema. There is also a persistent retrocardiac consolidation which may represent atelectasis, infiltrate or mass. Appearance is unchanged. ? If rotation playing a role.   - Try to extubate today if possible. Discussed with Jan, [place]. Family in agreement.   Cardiogenic shock  - Low-dose dopamine  - Urine output 1.7 L yesterday, but +1L overall.  - ECHO EF normal. Reassuring. Dopamine 10. Trying to wean. BP decrease may have been more associated with bradycardia.   Complete heart block/second-degree AV block [place] 1/ 2:1 AVB as well  - Complete heart block, this was seen in the Cath Lab. Temporary wire was discontinued but then had to be replaced on 6/19 because of hypotension associated with  2:1 AVB and HR of 45bpm despite dopamine 20.   - Hopefully with PCI to RCA acute marginal branch, this will decrease risk of future complete AV block.   - Currently on telemetry she is demonstrating sinus rhythm  - Off amiodarone use in this setting worsening overall AV block.  Off carvedilol as well.  - Keep temp wire today. Hopeful DC tomorrow without need for permanent pacer.   Hypokalemia  repleted  CODE STATUS  - Partial code currently area was previously DO NOT RESUSCITATE but this was partially reversed because of  acute pulmonary edema in Cath Lab setting.  - When she is stable for extubation, CODE STATUS should resume to DO NOT RESUSCITATE.  - Patient's power of attorney are Jan and Cherlynn Kaiser. Family wishes for short-term intubation only.  Critical care time 35 minutes spent with patient, extensive data review, complex multisystem organ failure, review with care teams. Spoke with Isabell Jarvis [facility] 09/10/2015, 9:06 AM

## 2015-09-10 NOTE — Procedures (Signed)
Extubation Procedure Note  Patient Details:   Name: Teresa Franklin DOB: [birth year] MRN: [id number]   Airway Documentation:     Evaluation  O2 sats: stable throughout Complications: No apparent complications Patient did tolerate procedure well. Bilateral Breath Sounds: Clear, Diminished   Yes   PT was extubated to a 4L [place]  PT attempted to speak  Sats are stable  RT to monitor   [name], [name] 09/10/2015, 2:28 PM

## 2015-09-11 ENCOUNTER — Encounter (HOSPITAL_COMMUNITY): Payer: Self-pay | Admitting: *Deleted

## 2015-09-11 ENCOUNTER — Inpatient Hospital Stay (HOSPITAL_COMMUNITY): Payer: Medicare Other

## 2015-09-11 DIAGNOSIS — Z01818 Encounter for other preprocedural examination: Secondary | ICD-10-CM | POA: Diagnosis present

## 2015-09-11 LAB — GLUCOSE, CAPILLARY
GLUCOSE-CAPILLARY: 112 mg/dL — AB (ref 65–99)
Glucose-Capillary: 113 mg/dL — ABNORMAL HIGH (ref 65–99)
Glucose-Capillary: 85 mg/dL (ref 65–99)

## 2015-09-11 LAB — BASIC METABOLIC PANEL
ANION GAP: 8 (ref 5–15)
BUN: 28 mg/dL — ABNORMAL HIGH (ref 6–20)
CALCIUM: 8.2 mg/dL — AB (ref 8.9–10.3)
CO2: 22 mmol/L (ref 22–32)
Chloride: 109 mmol/L (ref 101–111)
Creatinine, Ser: 0.99 mg/dL (ref 0.44–1.00)
GFR, EST AFRICAN AMERICAN: 60 mL/min — AB (ref >60)
GFR, EST NON AFRICAN AMERICAN: 52 mL/min — AB (ref >60)
Glucose, Bld: 102 mg/dL — ABNORMAL HIGH (ref 65–99)
Potassium: 3.7 mmol/L (ref 3.5–5.1)
SODIUM: 139 mmol/L (ref 135–145)

## 2015-09-11 LAB — PHOSPHORUS: PHOSPHORUS: 2.8 mg/dL (ref 2.5–4.6)

## 2015-09-11 LAB — MAGNESIUM: Magnesium: 2.2 mg/dL (ref 1.7–2.4)

## 2015-09-11 MED ORDER — BOOST / RESOURCE BREEZE PO LIQD
1.0000 | Freq: Two times a day (BID) | ORAL | Status: DC
  Administered 2015-09-13 – 2015-09-18 (×4): 1 via ORAL

## 2015-09-11 MED ORDER — FUROSEMIDE 10 MG/ML IJ SOLN
40.0000 mg | Freq: Two times a day (BID) | INTRAMUSCULAR | Status: AC
  Administered 2015-09-11 (×2): 40 mg via INTRAVENOUS
  Filled 2015-09-11 (×2): qty 4

## 2015-09-11 MED ORDER — POLYETHYLENE GLYCOL 3350 17 G PO PACK
17.0000 g | PACK | Freq: Every day | ORAL | Status: DC
  Administered 2015-09-12 – 2015-09-17 (×5): 17 g via ORAL
  Filled 2015-09-11 (×5): qty 1

## 2015-09-11 NOTE — Progress Notes (Signed)
Nutrition Follow-up  INTERVENTION:   Boost Breeze po BID, each supplement provides 250 kcal and 9 grams of protein  NUTRITION DIAGNOSIS:   Inadequate oral intake related to  (decreased appetite) as evidenced by meal completion < 50%. Ongoing.   GOAL:   Patient will meet greater than or equal to 90% of their needs Progressing.   MONITOR:   PO intake, Supplement acceptance, I & O's  ASSESSMENT:   82 year old female with HTN and CAD presenting to the ICU after being intubated in the cath lab for flash pulmonary edema.  6/20 extubated  Pt complains of constipation, RN aware and pt receiving mediations.  Breakfast at bedside, little consumed. Per sitter pt was coughing a lot and was unable to take much. Pt states that ensure hurts her stomach but agreeable to boost breeze.   Medications reviewed and include: MVI, miralax Labs reviewed  Diet Order:  Diet clear liquid Room service appropriate?: Yes; Fluid consistency:: Thin  Skin:  Reviewed, no issues  Last BM:  6/21  Height:   Ht Readings from Last 1 Encounters:  09/08/15 5\' 4"  (1.626 m)    Weight:   Wt Readings from Last 1 Encounters:  09/11/15 137 lb 5.6 oz (62.3 kg)    Ideal Body Weight:  54.5 kg  BMI:  Body mass index is 23.56 kg/(m^2).  Estimated Nutritional Needs:   Kcal:  1500-1700  Protein:  75-85 grams  Fluid:  > 1.5 L/day  EDUCATION NEEDS:   No education needs identified at this time  [place], [place], [place] Pager [phone] After Hours Pager

## 2015-09-11 NOTE — Progress Notes (Signed)
PT Cancellation Note  Patient Details Name: Teresa Franklin MRN: [id number] DOB: [birth year]   Cancelled Treatment:    Reason Eval/Treat Not Completed: Medical issues which prohibited therapy.  Femoral sheath removed this AM.  Will attempt to see pt again this afternoon, time permitting.  Collie Siad PT, DPT  Pager: [phone] Phone: [phone] 09/11/2015, 12:49 PM

## 2015-09-11 NOTE — Progress Notes (Addendum)
PULMONARY / CRITICAL CARE MEDICINE   Name: Teresa Franklin MRN: [id number] DOB: 12/04/1930    ADMISSION DATE:  09/08/2015 CONSULTATION DATE:  09/08/2015  REFERRING MD:  Dr. Rebbeca Paul, cardiology.  CHIEF COMPLAINT:  Respiratory failure  HISTORY OF PRESENT ILLNESS:   82 year old female with PMH of HTN and remote ICH who developed a-fib and was taken to the cath lab emergently.  Occluded distal RCA was noted.  During procedure patient developed pulmonary edema and was intubated in the cath lab.  PCCM was called to assist with vent management.    SUBJECTIVE:  Extubated 6/20.  C/o constipation. Denies SOB.   VITAL SIGNS: BP 105/44 mmHg  Pulse 65  Temp(Src) 98.6 F (37 C) (Oral)  Resp 25  Ht 5\' 4"  (1.626 m)  Wt 62.3 kg (137 lb 5.6 oz)  BMI 23.56 kg/m2  SpO2 99%  HEMODYNAMICS:    VENTILATOR SETTINGS: Vent Mode:  [-] CPAP;PSV FiO2 (%):  [50 %] 50 % PEEP:  [5 cmH20] 5 cmH20 Pressure Support:  [5 cmH20] 5 cmH20  INTAKE / OUTPUT: I/O last 3 completed shifts: In: 1903.2 [I.V.:1290.7; NG/GT:612.5] Out: 2090 [Urine:2090]  PHYSICAL EXAMINATION: General:  Elderly appearing female, NAD. Neuro:  Awake, alert, mild confusion, MAE, gen weakness  HEENT:  PERRL Cardiovascular:  RRR, Nl S1/S2  M Lungs:  resps even non labored on RA, coarse Abdomen:  Soft, NT, ND and +BS. Musculoskeletal:  -edema and -tenderness Skin:  No rash  LABS:  BMET  Recent Labs Lab 09/09/15 0350 09/10/15 0357 09/11/15 0440  NA 136 136 139  K 3.2* 4.0 3.7  CL 108 110 109  CO2 21* 21* 22  BUN 13 25* 28*  CREATININE 0.89 1.02* 0.99  GLUCOSE 151* 160* 102*    Electrolytes  Recent Labs Lab 09/09/15 0350  09/10/15 0357 09/10/15 2145 09/11/15 0440  CALCIUM 8.4*  --  8.3*  --  8.2*  MG 1.5*  < > 2.7* 2.1 2.2  PHOS 2.2*  < > 2.4* 2.1* 2.8  < > = values in this interval not displayed.  CBC  Recent Labs Lab 09/08/15 1159 09/08/15 1415 09/09/15 0350 09/10/15 0357  WBC 9.7  --  11.5* 7.8   HGB 9.8* 10.2* 10.2* 9.0*  HCT 28.9* 30.0* 29.7* 27.7*  PLT 76*  --  116* 108*    Coag's  Recent Labs Lab 09/06/15 1803  APTT 148*  INR 1.18    Sepsis Markers No results for input(s): LATICACIDVEN, PROCALCITON, O2SATVEN in the last 168 hours.  ABG  Recent Labs Lab 09/08/15 1843 09/09/15 0404 09/10/15 1218  PHART 7.359 7.475* 7.352  PCO2ART 35.4 28.7* 36.3  PO2ART 60.0* 82.5 71.0*    Liver Enzymes  Recent Labs Lab 09/06/15 1245 09/09/15 0350 09/10/15 0357  AST 140* 80* 46*  ALT 27 46 31  ALKPHOS 64 79 71  BILITOT 0.7 1.0 0.9  ALBUMIN 4.5 2.6* 2.2*    Cardiac Enzymes  Recent Labs Lab 09/06/15 1803 09/06/15 2216 09/07/15 0522  TROPONINI 10.87* 10.67* 14.97*    Glucose  Recent Labs Lab 09/10/15 1139 09/10/15 1542 09/10/15 2139 09/11/15 0052 09/11/15 0444 09/11/15 0725  GLUCAP 138* 112* 122* 113* 112* 85    Imaging Dg Chest Port 1 View  09/11/2015  CLINICAL DATA:  Acute respiratory failure. EXAM: PORTABLE CHEST 1 VIEW COMPARISON:  09/10/2015.  09/09/2015. FINDINGS: Right IJ line in stable position. Cardiomegaly with diffuse bilateral pulmonary interstitial prominence and bilateral pleural effusions consistent congestive heart failure. No pneumothorax.  IMPRESSION: 1. Right IJ line stable position. 2. Cardiomegaly with diffuse bilateral pulmonary interstitial prominence and bilateral pleural effusions consistent with congestive heart failure. Similar findings on prior exam. Electronically Signed   By: [place]   On: 09/11/2015 07:30     STUDIES:    CULTURES: None  ANTIBIOTICS: None  SIGNIFICANT EVENTS: 6/18 flash pulmonary edema, intubated. 6/19 temp pacer wire, hypotension   LINES/TUBES: ETT 6/18>>> R IJ 6/18>>> R radial a-line 6/18>>> R femoral arterial and venous sheaths 6/18>>>  DISCUSSION: 82 year old female with HTN and CAD presenting to the ICU after being intubated in the cath lab for flash pulmonary edema.  Family  wishes for short term intubation only.  ASSESSMENT / PLAN:  PULMONARY A: Acute respiratory failure due to acute pulmonary edema P:   Extubated 6/20  Pulmonary hygiene  Mobilize  DNR/DNI F/u CXR  Continue diuresis as tol  Comfort if fails    CARDIOVASCULAR A:  Cardiogenic shock - improved.  Off pressors.  NSTEMI P:  Cards primary  Diuresis as tol  No further PCI per cards    RENAL A:   Hypok, hypophos, hypomag P:   Replete K PRN  F/u chem   GASTROINTESTINAL A:   Constipation  P:   Po diet as tol  Add miralax    HEMATOLOGIC A:   Thrombocytopenia. Anemia, dilution P:  - CBC in AM. -lovenox  INFECTIOUS A:   No evidence of active infection. P:   - Monitor WBC and fever curves.  ENDOCRINE A:   Hypothyroidism  P:   Cont replacement  NEUROLOGIC A:   Acute encephalopathy - improving.  P:   Mobilize   FAMILY  - Updates: son updated at length at bedside 6/21  Will tx tele.  Will ask [facility] to assist with medical management beginning 6/22.  PCCM singing off please call back if needed.    Nickolas Madrid, NP 09/11/2015  9:34 AM Pager: [phone] or [phone]    STAFF NOTE: I, Merrie Roof, MD FACP have personally reviewed patient's available data, including medical history, events of note, physical examination and test results as part of my evaluation. I have discussed with resident/NP and other care providers such as pharmacist, RN and RRT. In addition, I personally evaluated patient and elicited key findings of: awake, no distress extubated, pcxr still with effusions, edema, tolerated renal fxn well, continued neg 1 liter daily balance, would keep same lasix dosing, chem in am with mg a, phos, dnr / dni noted, can move to [facility], med floor, diet start, mir add for BM, delirium noted, not harming self, would NOT use haldol, will provide behavior modification,    Lavon Paganini. Titus Mould, MD, [place] Pgr: [facility] 09/11/2015 10:01 AM

## 2015-09-11 NOTE — Progress Notes (Signed)
Cardiologist: Dr. Fletcher Anon Subjective:  Awake   On 6/19 - needed temp wire replaced, 2:1 AVB with HR 45 and decreased BP.   Overnight, pacing intermittently. Now 82.  Off AMIO, OFF Coreg 12.5  Extubated on 6/20. DNR again. Rare pacer use. Now NSR. Family agree with not advancing treatment. Would not want perm pacer.    Previous subarachnoid hemorrhage in March 2015 in the setting of fall with no prior cardiac history up until this admission who has been under increased stress of late with the poor health of her husband and 2 sisters. She developed second-degree heart block, ST segment changes, cardiogenic shock, pulmonary edema and was sent from [place] [facility] for emergent cardiac catheterization.  Echo earlier on this admission showed an EF of 60-65%  Objective:  Vital Signs in the last 24 hours: Temp:  [97.7 F (36.5 C)-100.7 F (38.2 C)] 98.6 F (37 C) (06/21 0400) Pulse Rate:  [65-85] 65 (06/21 0700) Resp:  [16-34] 25 (06/21 0700) BP: (103-105)/(44-50) 105/44 mmHg (06/20 1544) SpO2:  [94 %-100 %] 99 % (06/21 0700) Arterial Line BP: (92-123)/(36-57) 102/41 mmHg (06/21 0700) FiO2 (%):  [50 %] 50 % (06/20 1100) Weight:  [137 lb 5.6 oz (62.3 kg)] 137 lb 5.6 oz (62.3 kg) (06/21 0500)  Intake/Output from previous day: 06/20 0701 - 06/21 0700 In: 911.9 [I.V.:689.4; NG/GT:222.5] Out: 1850 [Urine:1850]   Physical Exam: General: Elderly, intubated, well nourished, comfortable  Head:  Normocephalic and atraumatic. ET tube Lungs: Clear to auscultation and percussion. Heart: Normal S1 and S2, occasional ectopy.  No murmur, rubs or gallops.  Abdomen: soft, non-tender, positive bowel sounds. Extremities: No clubbing or cyanosis. No edema. Soft restraints in place Neurologic: Alert. Soft restraints. Confused delirium.     Lab Results:  Recent Labs  09/09/15 0350 09/10/15 0357  WBC 11.5* 7.8  HGB 10.2* 9.0*  PLT 116* 108*    Recent Labs  09/10/15 0357 09/11/15 0440    NA 136 139  K 4.0 3.7  CL 110 109  CO2 21* 22  GLUCOSE 160* 102*  BUN 25* 28*  CREATININE 1.02* 0.99   No results for input(s): TROPONINI in the last 72 hours.  Invalid input(s): CK, MB Hepatic Function Panel  Recent Labs  09/10/15 0357  PROT 5.0*  ALBUMIN 2.2*  AST 46*  ALT 31  ALKPHOS 71  BILITOT 0.9    Recent Labs  09/09/15 0350  CHOL 114   No results for input(s): PROTIME in the last 72 hours.  Imaging: Dg Chest Port 1 View  09/11/2015  CLINICAL DATA:  Acute respiratory failure. EXAM: PORTABLE CHEST 1 VIEW COMPARISON:  09/10/2015.  09/09/2015. FINDINGS: Right IJ line in stable position. Cardiomegaly with diffuse bilateral pulmonary interstitial prominence and bilateral pleural effusions consistent congestive heart failure. No pneumothorax. IMPRESSION: 1. Right IJ line stable position. 2. Cardiomegaly with diffuse bilateral pulmonary interstitial prominence and bilateral pleural effusions consistent with congestive heart failure. Similar findings on prior exam. Electronically Signed   By: [place]   On: 09/11/2015 07:30   Dg Chest Port 1 View  09/10/2015  CLINICAL DATA: MI. EXAM: PORTABLE CHEST 1 VIEW COMPARISON:  09/09/2015. FINDINGS: Endotracheal tube, NG tube, right IJ line stable position. Heart size normal. Bilateral pulmonary infiltrates, right side greater than left are noted. These findings are consistent with pulmonary edema and/or pneumonia. Small bilateral pleural effusions are noted. No pneumothorax . Severe degenerative changes both shoulders. IMPRESSION: 1. Lines and tubes in stable position. 2. Bilateral  pulmonary infiltrates, right side greater than left. These findings are consistent with pulmonary edema and/or pneumonia. Small bilateral pleural effusions are also noted. Electronically Signed   By: Marcello Moores  Register   On: 09/10/2015 07:14   Personally viewed.   Telemetry: Sinus rhythm. V paced intermittent.   Personally viewed.   EKG:  Prior, 1  mm approximately ST segment elevation inferior leads with Q waves inferiorly as well. Personally viewed.  Cardiac Studies:  Cardiac catheterization reviewed 09/08/15: 1. Post Atrio lesion, 100% stenosed. Post intervention with Promus. DES 2.25 mm x 12 mm (2.3 mm), there is a 0% residual stenosis. 2. Prox LAD lesion, 75% stenosed. Dist midLAD lesion, 50% stenosed. 3. Severely elevated LVEDP consistent with acute likely combined systolic and diastolic heart failure 4. Resolved high-grade AV block - temporary wire removed 5. Successful intubation by anesthesiology for acute hypoxic respiratory failure 6. Right IJ central line placed along with a right radial arterial line placed by Dr. Haroldine Laws  Successful emergent PCI on Right Posterior AV groove branch with a DES stent. Successful intubation for hypoxic respiratory failure  Meds: Scheduled Meds: . antiseptic oral rinse  7 mL Mouth Rinse BID  . aspirin  81 mg Oral Daily  . atorvastatin  80 mg Oral q1800  . clopidogrel  75 mg Oral Q breakfast  . enoxaparin (LOVENOX) injection  40 mg Subcutaneous Q24H  . folic acid-pyridoxine-cyancobalamin  1 tablet Oral Daily  . levothyroxine  25 mcg Oral QAC breakfast  . magnesium oxide  200 mg Oral Daily  . multivitamin  1 tablet Oral Daily  . pantoprazole sodium  40 mg Per Tube Daily  . sodium chloride flush  3 mL Intravenous Q12H   Continuous Infusions: . sodium chloride Stopped (09/10/15 1035)  . DOPamine Stopped (09/10/15 1800)  . feeding supplement (VITAL AF 1.2 CAL) 1,000 mL (09/10/15 1033)  . fentaNYL infusion INTRAVENOUS 75 mcg/hr (09/10/15 1200)   PRN Meds:.sodium chloride, acetaminophen, LORazepam, nitroGLYCERIN, ondansetron (ZOFRAN) IV, sodium chloride flush, zolpidem  Assessment/Plan:  Principal Problem:   NSTEMI (non-ST elevated myocardial infarction) ([place]) Active Problems:   Hyperlipidemia with target LDL less than 70   Complete heart block (HCC)   Heart block AV second degree    Essential hypertension   Cardiogenic shock (HCC)   Acute respiratory failure with hypoxia (HCC)   Acute respiratory failure ([place])   82 year old with non-ST elevation myocardial infarction, who developed acute respiratory failure secondary to pulmonary edema in the setting of high left ventricular end-diastolic pressure in the Cath Lab on 09/08/15, cardiogenic shock, dopamine transferred from [place].  Non-ST elevation myocardial infarction  - Acute marginal RCA branch successfully stented, Dr. Ellyn Hack  - 75% residual proximal LAD lesion-discussed with Dr. Ellyn Hack. Discussed as well with Dr. Angelena Form and Carlene Coria. At this time, will forgo PCI to LAD (distal vessel fairly small and if symptoms develop as outpatient, consider rotational atherectomy of proximal LAD. However, this would jail some significant diagonal branches as well.  Will NOT pursue further PCI.  - Dual antiplatelet therapy  - Findings on ECG are suggestive of acute injury inferior, also Q waves inferiorly  - Troponin 15  - Repeat echocardiogram normal EF, reassuring. Previously normal EF. Hemoglobin mild decrease.   - AVOIDING Coreg because of bradycardia.   Acute respiratory failure  - Likely cardiogenic in origin from elevated left ventricular end-diastolic pressure, in setting ischemia.   - Lasix 40 BID again today. Hopeful reduction tomorrow. Breathing improved. Extubated 6/20.   - Appreciate  critical care medicine discussed with CCM team.   - Chest x-ray 09/11/15 shows continued continued pulmonary edema.   - Discussed with Jan, [place]. Family in agreement.   Cardiogenic shock  - Low-dose dopamine off. Resolved.   - Urine output 1.8 L yesterday, now even on admit.   - ECHO EF normal. Reassuring.   Complete heart block/second-degree AV block [place] 1/ 2:1 AVB as well  - Complete heart block, this was seen in the Cath Lab. Temporary wire was discontinued but then had to be replaced on 6/19 because  of hypotension associated with 2:1 AVB and HR of 45bpm despite dopamine 20.   - Hopefully with PCI to RCA acute marginal branch, this will decrease risk of future complete AV block.   - Currently on telemetry she is demonstrating sinus rhythm  - Off amiodarone use in this setting worsening overall AV block.  Off carvedilol as well.  - DC temp wire today.  Hypokalemia  repleted  CODE STATUS  - Partial code currently area was previously DO NOT RESUSCITATE but this was partially reversed because of acute pulmonary edema in Cath Lab setting.  - When she is stable for extubation, CODE STATUS should resume to DO NOT RESUSCITATE.  - Patient's power of attorney are Jan and Cherlynn Kaiser. Family wishes for short-term intubation only.   Transfer to floor, hospitalist team. (Delirium). No further advanced treatments.   Candee Furbish 09/11/2015, 8:57 AM

## 2015-09-12 LAB — BASIC METABOLIC PANEL
Anion gap: 10 (ref 5–15)
BUN: 29 mg/dL — ABNORMAL HIGH (ref 6–20)
CALCIUM: 8.6 mg/dL — AB (ref 8.9–10.3)
CHLORIDE: 105 mmol/L (ref 101–111)
CO2: 25 mmol/L (ref 22–32)
CREATININE: 1.06 mg/dL — AB (ref 0.44–1.00)
GFR calc Af Amer: 55 mL/min — ABNORMAL LOW (ref >60)
GFR calc non Af Amer: 48 mL/min — ABNORMAL LOW (ref >60)
GLUCOSE: 132 mg/dL — AB (ref 65–99)
Potassium: 3.5 mmol/L (ref 3.5–5.1)
Sodium: 140 mmol/L (ref 135–145)

## 2015-09-12 LAB — MAGNESIUM: Magnesium: 2 mg/dL (ref 1.7–2.4)

## 2015-09-12 LAB — PHOSPHORUS: Phosphorus: 2.4 mg/dL — ABNORMAL LOW (ref 2.5–4.6)

## 2015-09-12 MED ORDER — PROMETHAZINE HCL 25 MG PO TABS: 25.0000 mg | ORAL_TABLET | Freq: Four times a day (QID) | ORAL | Status: DC | PRN

## 2015-09-12 MED ORDER — METOCLOPRAMIDE HCL 10 MG PO TABS
10.0000 mg | ORAL_TABLET | Freq: Four times a day (QID) | ORAL | Status: DC | PRN
  Administered 2015-09-12 – 2015-09-13 (×2): 10 mg via ORAL
  Filled 2015-09-12 (×2): qty 1

## 2015-09-12 MED ORDER — POTASSIUM PHOSPHATES 15 MMOLE/5ML IV SOLN
20.0000 mmol | Freq: Once | INTRAVENOUS | Status: AC
  Administered 2015-09-12: 20 mmol via INTRAVENOUS
  Filled 2015-09-12: qty 6.67

## 2015-09-12 NOTE — Clinical Social Work Note (Addendum)
Clinical Social Work Assessment  Patient Details  Name: Teresa Franklin MRN: [id number] Date of Birth: [birth year]  Date of referral:  09/12/15               Reason for consult:  Discharge Planning                Permission sought to share information with:  Family Supports Permission granted to share information::  Yes, Verbal Permission Granted  Name::     Ecologist::  SNFs  Relationship::  Daughter  Contact Information:     Housing/Transportation Living arrangements for the past 2 months:  Single Family Home Source of Information:  Patient Patient Interpreter Needed:  None Criminal Activity/Legal Involvement Pertinent to Current Situation/Hospitalization:  No - Comment as needed Significant Relationships:  None Lives with:  Spouse Do you feel safe going back to the place where you live?  Yes Need for family participation in patient care:  Yes (Comment)  Care giving concerns:  The patient is aggregable for short term rehab at discharge. Patient would like to rebuild her strength to return home.    Social Worker assessment / plan:  CSW met with patient at beside to complete assessment. Patient was resting comfortably in bedside chair. CSW explained PT recommendation for SNF placement. CSW explained SNF search and placement process to the patient and answered her questions. Patient reported her support as her daughter/ POA. Patient reported she lives with her husband, but he is disabled. Per patient request CSW called [facility] patient's daughter.  CSW explained PT recommendation for SNF placement. CSW explained SNF search and placement process to the patient's daughter and answered her questions.CSW will follow up with bed offers.   Employment status:  Retired Nurse, adult PT Recommendations:  Not assessed at this time Information / Referral to community resources:  [place]  Patient/Family's Response to care:  The patient  appears happy with the care she is receiving in hospital and is appreciative of CSW assistance.  Patient/Family's Understanding of and Emotional Response to Diagnosis, Current Treatment, and Prognosis:  The patient's daughter has a good understanding of why the patient was admitted. She understands the care plan and what the patient will need post discharge.  Emotional Assessment Appearance:  Appears stated age Attitude/Demeanor/Rapport:   (Patient was welcoming of CSW and appropriate.) Affect (typically observed):  Accepting, Calm, Appropriate Orientation:  Oriented to Self, Oriented to Place Alcohol / Substance use:  Not Applicable Psych involvement (Current and /or in the community):  No (Comment)  Discharge Needs  Concerns to be addressed:  Discharge Planning Concerns Readmission within the last 30 days:  No Current discharge risk:  Physical Impairment Barriers to Discharge:  Continued Medical Work up   [employer], LCSW 09/12/2015, 10:58 AM

## 2015-09-12 NOTE — Progress Notes (Signed)
Progress Note    Teresa Franklin  [id number] DOB: [birth year]  DOA: 09/08/2015 PCP: [name], MD    Brief Narrative:   Teresa Franklin is an 82 y.o. female with h/o subarachnoid hemorrhage in March 2015 in the setting of a fall, HTN and HLD Who was admitted 09/08/15 for NSTEMI and CHB/ cardiogenic shock.   Assessment/Plan:   Principal Problem:   NSTEMI (non-ST elevated myocardial infarction) ([place]) Acute marginal RCA branch successfully stented, Dr. Ellyn Hack. 75% residual proximal LAD lesion-no current plans for PCI to LAD (distal vessel fairly small and if symptoms develop as outpatient, consider rotational atherectomy of proximal LAD. Repeat echocardiogram normal EF (55-60%), reassuring. Previously normal EF. No beta blocker secondary to bradycardia. Continue statin and dual antiplatelet therapy.  Active Problems:   Hyperlipidemia with target LDL less than 70 Continue statin.    Complete heart block (HCC) / heart block AV second-degree Observed in the Cath Lab. Temporary wire was discontinued but then had to be replaced on 09/09/15 because of hypotension associated with 2:1 AVB and HR of 45 bpm despite dopamine 20. Maintaining sinus rhythm. Family indicates that they would not want a permanent pacemaker placed. Avoid beta blockers.    Essential hypertension Managing with Lasix.    Cardiogenic shock ([place]) Needed dopamine to resolve. 2-D echo showed normal EF at 55-60 percent.    Acute respiratory failure with hypoxia (HCC)/Acute respiratory failure (HCC) Likely cardiogenic in origin from elevated left ventricular end-diastolic pressure, in setting ischemia. Successfully extubated 09/10/15. Chest x-ray done 09/11/15 which showed persistent pulmonary edema. Diuresis per cardiology. No plans for reintubation should she decompensate per family.    Hypothyroidism Continue Synthroid.    Hypophosphatemia Replete.   Family Communication/Anticipated D/C date and plan/Code  Status   DVT prophylaxis: Lovenox ordered. Code Status: DNR Family Communication: No family at the bedside. Disposition Plan: Per primary team.   Medical Consultants:   We are consulting.  Cardiology is primary.   Procedures:   2 D Echo 09/09/15  Study Conclusions  - Left ventricle: The cavity size was normal. There was mild  concentric hypertrophy of the septum. Systolic function was  normal. The estimated ejection fraction was in the range of 55%  to 60%. Wall motion was normal; there were no regional wall  motion abnormalities. - Aortic valve: Transvalvular velocity was within the normal range.  There was no stenosis. There was no regurgitation. - Mitral valve: There was moderate regurgitation. Effective  regurgitant orifice (PISA): 0.26 cm^2. Regurgitant volume (PISA):  39 ml. - Left atrium: The atrium was mildly dilated. - Right ventricle: The cavity size was normal. Wall thickness was  normal. Systolic function was normal. - Right atrium: The atrium was mildly dilated. - Tricuspid valve: There was moderate regurgitation. - Pulmonary arteries: Systolic pressure was moderately to severely  increased. PA peak pressure: 58 mm Hg (S). - Inferior vena cava: The vessel was dilated. - Pericardium, extracardiac: There was a left pleural effusion.  Anti-Infectives:   Anti-infectives    None      Subjective:    Teresa Franklin tells me she feels weak and "worn out".  Reports some dyspnea.  No chest pain.  Last BM was today.  Objective:    Filed Vitals:   09/11/15 1955 09/11/15 2113 09/12/15 0028 09/12/15 0613  BP: 114/52 126/53 139/69 154/75  Pulse: 76 81 80 72  Temp: 98.6 F (37 C)  98.4 F (36.9 C) 98.4 F (36.9 C)  TempSrc: Oral  Oral Oral  Resp: 18  18 20   Height:      Weight:      SpO2: 97%  94% 95%    Intake/Output Summary (Last 24 hours) at 09/12/15 0827 Last data filed at 09/12/15 0500  Gross per 24 hour  Intake    400 ml  Output    1590 ml  Net  -1190 ml   Filed Weights   09/09/15 0500 09/10/15 0447 09/11/15 0500  Weight: 63 kg (138 lb 14.2 oz) 62.4 kg (137 lb 9.1 oz) 62.3 kg (137 lb 5.6 oz)    Exam: General exam: Appears calm and comfortable.  Respiratory system: Clear to auscultation. Respiratory effort normal. Cardiovascular system: S1 & S2 heard, RRR. No JVD,  rubs, gallops or clicks. No murmurs. Gastrointestinal system: Abdomen is nondistended, soft and nontender. No organomegaly or masses felt. Normal bowel sounds heard. Central nervous system: Alert and oriented. No focal neurological deficits. Extremities: No clubbing, edema, or cyanosis. Skin: No rashes, lesions or ulcers Psychiatry: Judgement and insight appear normal. Mood & affect flat.   Data Reviewed:   I have personally reviewed following labs and imaging studies:  Labs: Basic Metabolic Panel:  Recent Labs Lab 09/07/15 0522 09/08/15 1415 09/09/15 0350  09/09/15 2120 09/10/15 0357 09/10/15 2145 09/11/15 0440 09/12/15 0337  NA 137 137 136  --   --  136  --  139 140  K 3.8 3.8 3.2*  --   --  4.0  --  3.7 3.5  CL 107 104 108  --   --  110  --  109 105  CO2 24  --  21*  --   --  21*  --  22 25  GLUCOSE 133* 193* 151*  --   --  160*  --  102* 132*  BUN 16 14 13   --   --  25*  --  28* 29*  CREATININE 0.71 0.90 0.89  --   --  1.02*  --  0.99 1.06*  CALCIUM 8.6*  --  8.4*  --   --  8.3*  --  8.2* 8.6*  MG  --   --  1.5*  < > 3.3* 2.7* 2.1 2.2 2.0  PHOS  --   --  2.2*  < > 3.0 2.4* 2.1* 2.8 2.4*  < > = values in this interval not displayed. GFR Estimated Creatinine Clearance: 35.3 mL/min (by C-G formula based on Cr of 1.06). Liver Function Tests:  Recent Labs Lab 09/05/15 2058 09/06/15 1245 09/09/15 0350 09/10/15 0357  AST 58* 140* 80* 46*  ALT 18 27 46 31  ALKPHOS 64 64 79 71  BILITOT 0.8 0.7 1.0 0.9  PROT 7.0 7.2 5.3* 5.0*  ALBUMIN 4.3 4.5 2.6* 2.2*    Recent Labs Lab 09/05/15 2058 09/06/15 1245  LIPASE 29 27   No  results for input(s): AMMONIA in the last 168 hours. Coagulation profile  Recent Labs Lab 09/06/15 1803  INR 1.18    CBC:  Recent Labs Lab 09/07/15 0522 09/08/15 0611 09/08/15 1159 09/08/15 1415 09/09/15 0350 09/10/15 0357  WBC 7.8 7.3 9.7  --  11.5* 7.8  NEUTROABS  --   --   --   --   --  6.3  HGB 12.4 10.2* 9.8* 10.2* 10.2* 9.0*  HCT 35.8 29.7* 28.9* 30.0* 29.7* 27.7*  MCV 90.1 91.7 91.4  --  89.2 93.0  PLT 75* 69* 76*  --  116* 108*  Cardiac Enzymes:  Recent Labs Lab 09/06/15 1358 09/06/15 1803 09/06/15 2216 09/07/15 0522  TROPONINI 8.30* 10.87* 10.67* 14.97*   BNP (last 3 results) No results for input(s): PROBNP in the last 8760 hours. CBG:  Recent Labs Lab 09/10/15 1542 09/10/15 2139 09/11/15 0052 09/11/15 0444 09/11/15 0725  GLUCAP 112* 122* 113* 112* 85   D-Dimer: No results for input(s): DDIMER in the last 72 hours. Hgb A1c: No results for input(s): HGBA1C in the last 72 hours. Lipid Profile: No results for input(s): CHOL, HDL, LDLCALC, TRIG, CHOLHDL, LDLDIRECT in the last 72 hours. Thyroid function studies: No results for input(s): TSH, T4TOTAL, T3FREE, THYROIDAB in the last 72 hours.  Invalid input(s): FREET3 Anemia work up: No results for input(s): VITAMINB12, FOLATE, FERRITIN, TIBC, IRON, RETICCTPCT in the last 72 hours. Sepsis Labs:  Recent Labs Lab 09/08/15 0611 09/08/15 1159 09/09/15 0350 09/10/15 0357  WBC 7.3 9.7 11.5* 7.8   Urine analysis:    Component Value Date/Time   COLORURINE YELLOW* 09/06/2015 2017   COLORURINE Amber 03/26/2013 1808   APPEARANCEUR CLEAR* 09/06/2015 2017   APPEARANCEUR Clear 03/26/2013 1808   LABSPEC 1.035* 09/06/2015 2017   LABSPEC 1.015 03/26/2013 1808   PHURINE 5.0 09/06/2015 2017   PHURINE 6.0 03/26/2013 1808   GLUCOSEU NEGATIVE 09/06/2015 2017   GLUCOSEU Negative 03/26/2013 1808   HGBUR NEGATIVE 09/06/2015 2017   HGBUR 2+ 03/26/2013 1808   BILIRUBINUR NEGATIVE 09/06/2015 2017    BILIRUBINUR Negative 03/26/2013 1808   KETONESUR 1+* 09/06/2015 2017   KETONESUR 2+ 03/26/2013 1808   PROTEINUR 30* 09/06/2015 2017   PROTEINUR 75 mg/dL 03/26/2013 1808   NITRITE NEGATIVE 09/06/2015 2017   NITRITE Negative 03/26/2013 1808   LEUKOCYTESUR NEGATIVE 09/06/2015 2017   LEUKOCYTESUR Negative 03/26/2013 1808   Microbiology Recent Results (from the past 240 hour(s))  Surgical pcr screen     Status: None   Collection Time: 09/08/15 12:16 PM  Result Value Ref Range Status   MRSA, PCR NEGATIVE NEGATIVE Final   Staphylococcus aureus NEGATIVE NEGATIVE Final    Comment:        The Xpert SA Assay (FDA approved for NASAL specimens in patients over 17 years of age), is one component of a comprehensive surveillance program.  Test performance has been validated by [facility] for patients greater than or equal to 76 year old. It is not intended to diagnose infection nor to guide or monitor treatment.   MRSA PCR Screening     Status: None   Collection Time: 09/08/15  5:03 PM  Result Value Ref Range Status   MRSA by PCR NEGATIVE NEGATIVE Final    Comment:        The GeneXpert MRSA Assay (FDA approved for NASAL specimens only), is one component of a comprehensive MRSA colonization surveillance program. It is not intended to diagnose MRSA infection nor to guide or monitor treatment for MRSA infections.     Radiology: Dg Chest Port 1 View  09/11/2015  CLINICAL DATA:  Acute respiratory failure. EXAM: PORTABLE CHEST 1 VIEW COMPARISON:  09/10/2015.  09/09/2015. FINDINGS: Right IJ line in stable position. Cardiomegaly with diffuse bilateral pulmonary interstitial prominence and bilateral pleural effusions consistent congestive heart failure. No pneumothorax. IMPRESSION: 1. Right IJ line stable position. 2. Cardiomegaly with diffuse bilateral pulmonary interstitial prominence and bilateral pleural effusions consistent with congestive heart failure. Similar findings on prior exam.  Electronically Signed   By: Clyde   On: 09/11/2015 07:30    Medications:   .  antiseptic oral rinse  7 mL Mouth Rinse BID  . aspirin  81 mg Oral Daily  . atorvastatin  80 mg Oral q1800  . clopidogrel  75 mg Oral Q breakfast  . enoxaparin (LOVENOX) injection  40 mg Subcutaneous Q24H  . feeding supplement  1 Container Oral BID BM  . folic acid-pyridoxine-cyancobalamin  1 tablet Oral Daily  . levothyroxine  25 mcg Oral QAC breakfast  . magnesium oxide  200 mg Oral Daily  . multivitamin  1 tablet Oral Daily  . pantoprazole sodium  40 mg Per Tube Daily  . polyethylene glycol  17 g Oral Daily   Continuous Infusions:   Time spent: 25 minutes.   LOS: 4 days   [facility] [facility] Pager [phone]. If unable to reach me by pager, please call my cell phone at [phone].  *Please refer to [email or web address], password [email or web address] to get updated schedule on who will round on this patient, as hospitalists switch teams weekly. If 7PM-7AM, please contact night-coverage at [email or web address], password [email or web address] for any overnight needs.  09/12/2015, 8:27 AM

## 2015-09-12 NOTE — Progress Notes (Signed)
Patient Profile: 82 y/o female with h/o subarachnoid hemorrhage in March 2015 setting of a fall, also HTN and HLD  admitted for NSTEMI and CHB/ cardiogenic shock.   Subjective: Main complaint is that she feels weak. RN noted increased confusion overnight. She now has a Actuary, but A&O x 3 to person, place and time this am. No CP. Mild dyspnea. She notes her appetite has improved some.    Objective: Vital signs in last 24 hours: Temp:  [97.7 F (36.5 C)-98.6 F (37 C)] 98.4 F (36.9 C) (06/22 [id number]) Pulse Rate:  [66-81] 72 (06/22 0613) Resp:  [18-34] 20 (06/22 0613) BP: (103-154)/(52-75) 154/75 mmHg (06/22 0613) SpO2:  [80 %-100 %] 95 % (06/22 [id number]) Arterial Line BP: (126)/(45) 126/45 mmHg (06/21 0800) Last BM Date: 09/11/15  Intake/Output from previous day: 06/21 0701 - 06/22 0700 In: 400 [P.O.:360; I.V.:40] Out: 1590 [Urine:1590] Intake/Output this shift:    Medications Current Facility-Administered Medications  Medication Dose Route Frequency Provider Last Rate Last Dose  . acetaminophen (TYLENOL) tablet 650 mg  650 mg Oral Q4H PRN Lorretta Harp, MD   650 mg at 09/12/15 0324  . antiseptic oral rinse (CPC / CETYLPYRIDINIUM CHLORIDE 0.05%) solution 7 mL  7 mL Mouth Rinse BID Leonie Man, MD   7 mL at 09/11/15 1000  . aspirin chewable tablet 81 mg  81 mg Oral Daily Rhonda G Barrett, PA-C   81 mg at 09/11/15 1123  . atorvastatin (LIPITOR) tablet 80 mg  80 mg Oral q1800 Rhonda G Barrett, PA-C   80 mg at 09/09/15 1836  . clopidogrel (PLAVIX) tablet 75 mg  75 mg Oral Q breakfast Evelene Croon Barrett, PA-C   75 mg at 09/11/15 0813  . enoxaparin (LOVENOX) injection 40 mg  40 mg Subcutaneous Q24H Rhonda G Barrett, PA-C   40 mg at 09/11/15 1957  . feeding supplement (BOOST / RESOURCE BREEZE) liquid 1 Container  1 Container Oral BID BM Raylene Miyamoto, MD   1 Container at 09/11/15 1400  . folic acid-pyridoxine-cyancobalamin (FOLTX) 2.5-25-2 MG per tablet 1 tablet  1 tablet Oral  Daily Evelene Croon Barrett, PA-C   1 tablet at 09/11/15 1122  . levothyroxine (SYNTHROID, LEVOTHROID) tablet 25 mcg  25 mcg Oral QAC breakfast Evelene Croon Barrett, PA-C   25 mcg at 09/11/15 0813  . LORazepam (ATIVAN) injection 0.5 mg  0.5 mg Intravenous BID PRN Raylene Miyamoto, MD   0.5 mg at 09/11/15 1957  . magnesium oxide (MAG-OX) tablet 200 mg  200 mg Oral Daily Rhonda G Barrett, PA-C   200 mg at 09/11/15 1122  . multivitamin (PROSIGHT) tablet 1 tablet  1 tablet Oral Daily Isaiah Serge, NP   1 tablet at 09/11/15 1122  . nitroGLYCERIN (NITROSTAT) SL tablet 0.4 mg  0.4 mg Sublingual Q5 Min x 3 PRN Rhonda G Barrett, PA-C      . ondansetron (ZOFRAN) injection 4 mg  4 mg Intravenous Q6H PRN Evelene Croon Barrett, PA-C   4 mg at 09/12/15 0023  . pantoprazole sodium (PROTONIX) 40 mg/20 mL oral suspension 40 mg  40 mg Per Tube Daily Rhonda G Barrett, PA-C   40 mg at 09/11/15 1123  . polyethylene glycol (MIRALAX / GLYCOLAX) packet 17 g  17 g Oral Daily Raylene Miyamoto, MD   17 g at 09/11/15 1600  . zolpidem (AMBIEN) tablet 5 mg  5 mg Oral QHS PRN Evelene Croon Barrett, PA-C   5 mg at 09/12/15  0057    PE: General appearance: alert, cooperative and elderly  Lungs: clear to auscultation bilaterally Heart: regular rate and rhythm, S1, S2 normal, no murmur, click, rub or gallop Abdomen: soft, non-tender; bowel sounds normal; no masses,  no organomegaly Extremities: no LEE Pulses: 2+ and symmetric Skin: warm and dry Neurologic: Grossly normal  Lab Results:   Recent Labs  09/10/15 0357  WBC 7.8  HGB 9.0*  HCT 27.7*  PLT 108*   BMET  Recent Labs  09/10/15 0357 09/11/15 0440 09/12/15 0337  NA 136 139 140  K 4.0 3.7 3.5  CL 110 109 105  CO2 21* 22 25  GLUCOSE 160* 102* 132*  BUN 25* 28* 29*  CREATININE 1.02* 0.99 1.06*  CALCIUM 8.3* 8.2* 8.6*     Assessment/Plan  Principal Problem:   NSTEMI (non-ST elevated myocardial infarction) (HCC) Active Problems:   Hyperlipidemia with target LDL  less than 70   Complete heart block (HCC)   Heart block AV second degree   Essential hypertension   Cardiogenic shock (HCC)   Acute respiratory failure with hypoxia (HCC)   Acute respiratory failure (HCC)   Encounter for intubation   1. NSTEMI - Acute marginal RCA branch successfully stented, Dr. Ellyn Hack - 75% residual proximal LAD lesion-discussed with Dr. Ellyn Hack. Discussed as well with Dr. Angelena Form and Carlene Coria. At this time, will forgo PCI to LAD (distal vessel fairly small and if symptoms develop as outpatient, consider rotational atherectomy of proximal LAD. However, this would jail some significant diagonal branches as well. Will NOT pursue further PCI. - She is CP Free  - Dual antiplatelet therapy - Findings on ECG are suggestive of acute injury inferior, also Q waves inferiorly - Troponin 15 - Repeat echocardiogram normal EF (55-60%), reassuring. Previously normal EF. Hemoglobin mild decrease.  - AVOIDING Coreg because of bradycardia.  - Continue ASA, Plavix and Statin.    2. Cardiogenic Shock: - Low-dose dopamine off. Resolved.  - Urine output 1.5 L yesterday, now net negative 1.1L.  - ECHO EF normal, EF 55-60%. Reassuring.   3.  Complete Heart Block/Second Degree AV Block [place] 1/ 2:1 AVB as well: - Complete heart block, this was seen in the Cath Lab. Temporary wire was discontinued but then had to be replaced on 6/19 because of hypotension associated with 2:1 AVB and HR of 45bpm despite dopamine 20.  - Hopefully with PCI to RCA acute marginal branch, this will decrease risk of future complete AV block.  - Currently on telemetry she is demonstrating sinus rhythm - Off amiodarone use in this setting worsening overall AV block. Off carvedilol as well.  - NSR on telemetry - DC temp wire discontinued. Family agree with not advancing treatment. Would not want perm pacer.  4. Acute Respiratory Failure: - Likely cardiogenic in origin from elevated left  ventricular end-diastolic pressure, in setting ischemia.  - Lasix 40 BID again today. Hopeful reduction tomorrow. Breathing improved. Extubated 6/20.  - Appreciate critical care medicine discussed with CCM team.  - Chest x-ray 09/11/15 shows continued continued pulmonary edema.  - Discussed with Jan, [place]. Family in agreement.   5. Confusion: RN reported confusion overnight. She now has a Actuary and A&O x 3 to person, place and time this am.  5. Code Status: DNR   LOS: 4 days    Brittainy M. Ladoris Gene 09/12/2015 7:44 AM  Personally seen and examined. Agree with above. PT/OT - weak, into chair. ?SNF in future Watch for  worsening delirium.  HR doing well off Coreg and Amio. No pacer.   Hopeful DC to SNF soon.   Candee Furbish, MD

## 2015-09-12 NOTE — Progress Notes (Signed)
CARDIAC REHAB PHASE I   PRE:  Rate/Rhythm: 77 SR    BP: sitting 161/75    SaO2: 96 2L  MODE:  Ambulation: to door then recliner   POST:  Rate/Rhythm: 94    BP: sitting 162/82     SaO2: 93 RA  Pt generally anxious with c/o abdominal pain. She just came off bedpan with reported stool. Agreeable to walking in room. Used RW and assist x2 to walk to door from bed and then around bed to recliner. Moves well without assist, just anxious. C/o coldness and stomach hurting and needing feet elevated. Left pt off O2 and NT to check later. Apparently pt is normally quite active. Will f/u tomorrow. Encouraged pt to walk more. [place], ACSM 09/12/2015 2:09 PM

## 2015-09-12 NOTE — NC FL2 (Signed)
[place] MEDICAID FL2 LEVEL OF CARE SCREENING TOOL     IDENTIFICATION  Patient Name: Teresa Franklin Birthdate: [birth year] Sex: female Admission Date (Current Location): 09/08/2015  [facility] and [place] Number:  Herbalist and Address:  [facility], [place] [address] [place], [place] [address]      Provider Number:    Attending Physician Name and Address:  Leonie Man, MD  Relative Name and Phone Number:       Current Level of Care: Hospital Recommended Level of Care: [place] Prior Approval Number:    Date Approved/Denied:   PASRR Number: [id number]  Discharge Plan: SNF    Current Diagnoses: Patient Active Problem List   Diagnosis Date Noted  . Encounter for intubation   . Acute respiratory failure ([place])   . Complete heart block ([place]) 09/08/2015  . Acute blood loss anemia -thought to be related to in her muscular injection of Phenergan 09/08/2015  . Acute respiratory failure with hypoxia ([place]) 09/08/2015  . Heart block AV second degree   . Essential hypertension   . Arterial hypotension   . Cardiogenic shock ([place])   . NSTEMI (non-ST elevated myocardial infarction) ([place]) 09/06/2015  . Subdural hemorrhage (HCC) -history of     Class: History of  . Hyperlipidemia with target LDL less than 70   . Hepatic steatosis     Orientation RESPIRATION BLADDER Height & Weight     Self, Place  O2 (2L/min) Continent Weight: 137 lb 5.6 oz (62.3 kg) Height:  5\' 4"  (162.6 cm)  BEHAVIORAL SYMPTOMS/MOOD NEUROLOGICAL BOWEL NUTRITION STATUS   (None)  (None) Continent  (Clear Liquid )  AMBULATORY STATUS COMMUNICATION OF NEEDS Skin   Extensive Assist Verbally Normal                       Personal Care Assistance Level of Assistance  Bathing, Dressing, Feeding Bathing Assistance: Limited assistance Feeding assistance: Independent Dressing Assistance: Limited assistance     Functional Limitations Info  Sight, Hearing,  Speech Sight Info: Adequate Hearing Info: Impaired Speech Info: Adequate    SPECIAL CARE FACTORS FREQUENCY  PT (By licensed PT), OT (By licensed OT)     PT Frequency: 5/ week OT Frequency: 5/ week            Contractures Contractures Info: Not present    Additional Factors Info  Code Status, Allergies Code Status Info: DNR Allergies Info: Codeine, Flomax, Norco, Sulfa Antibiotics           Current Medications (09/12/2015):  This is the current hospital active medication list Current Facility-Administered Medications  Medication Dose Route Frequency Provider Last Rate Last Dose  . acetaminophen (TYLENOL) tablet 650 mg  650 mg Oral Q4H PRN Lorretta Harp, MD   650 mg at 09/12/15 0324  . antiseptic oral rinse (CPC / CETYLPYRIDINIUM CHLORIDE 0.05%) solution 7 mL  7 mL Mouth Rinse BID Leonie Man, MD   7 mL at 09/11/15 1000  . aspirin chewable tablet 81 mg  81 mg Oral Daily Rhonda G Barrett, PA-C   81 mg at 09/12/15 1006  . atorvastatin (LIPITOR) tablet 80 mg  80 mg Oral q1800 Rhonda G Barrett, PA-C   80 mg at 09/09/15 1836  . clopidogrel (PLAVIX) tablet 75 mg  75 mg Oral Q breakfast Evelene Croon Barrett, PA-C   75 mg at 09/12/15 0803  . enoxaparin (LOVENOX) injection 40 mg  40 mg Subcutaneous  Q24H Evelene Croon Barrett, PA-C   40 mg at 09/11/15 1957  . feeding supplement (BOOST / RESOURCE BREEZE) liquid 1 Container  1 Container Oral BID BM Raylene Miyamoto, MD   1 Container at 09/11/15 1400  . folic acid-pyridoxine-cyancobalamin (FOLTX) 2.5-25-2 MG per tablet 1 tablet  1 tablet Oral Daily Evelene Croon Barrett, PA-C   1 tablet at 09/12/15 1006  . levothyroxine (SYNTHROID, LEVOTHROID) tablet 25 mcg  25 mcg Oral QAC breakfast Evelene Croon Barrett, PA-C   25 mcg at 09/12/15 0803  . LORazepam (ATIVAN) injection 0.5 mg  0.5 mg Intravenous BID PRN Raylene Miyamoto, MD   0.5 mg at 09/11/15 1957  . magnesium oxide (MAG-OX) tablet 200 mg  200 mg Oral Daily Rhonda G Barrett, PA-C   200 mg at  09/12/15 1006  . multivitamin (PROSIGHT) tablet 1 tablet  1 tablet Oral Daily Isaiah Serge, NP   1 tablet at 09/12/15 1006  . nitroGLYCERIN (NITROSTAT) SL tablet 0.4 mg  0.4 mg Sublingual Q5 Min x 3 PRN Rhonda G Barrett, PA-C      . ondansetron (ZOFRAN) injection 4 mg  4 mg Intravenous Q6H PRN Evelene Croon Barrett, PA-C   4 mg at 09/12/15 0023  . pantoprazole sodium (PROTONIX) 40 mg/20 mL oral suspension 40 mg  40 mg Per Tube Daily Rhonda G Barrett, PA-C   40 mg at 09/12/15 1006  . polyethylene glycol (MIRALAX / GLYCOLAX) packet 17 g  17 g Oral Daily Raylene Miyamoto, MD   17 g at 09/12/15 1006  . potassium phosphate 20 mmol in dextrose 5 % 500 mL infusion  20 mmol Intravenous Once Venetia Maxon Rama, MD   20 mmol at 09/12/15 1006  . zolpidem (AMBIEN) tablet 5 mg  5 mg Oral QHS PRN Evelene Croon Barrett, PA-C   5 mg at 09/12/15 [id number]     Discharge Medications: Please see discharge summary for a list of discharge medications.  Relevant Imaging Results:  Relevant Lab Results:   Additional Information [id number]  LaShonda A Aycock, LCSW    -- pt d/c'd = this FL2 was not used.  Glenetta Hew, MD

## 2015-09-12 NOTE — Evaluation (Signed)
Physical Therapy Evaluation Patient Details Name: Teresa Franklin MRN: [id number] DOB: [birth year] Today's Date: 09/12/2015   History of Present Illness  Pt adm with NSTEMI and underwent emergent cath and PCI on 6/18. Developed pulmonary edema during cath and intubated. Extubated 6/20. PMH - SDH, HTN, arthritis  Clinical Impression  Pt admitted with above diagnosis and presents to PT with functional limitations due to deficits listed below (See PT problem list). Pt needs skilled PT to maximize independence and safety to allow discharge to ST-SNF.  Pt with very poor functional activity tolerance and minimal activity exhausts her. Feel she needs SNF prior to return home unless she has family that can provide substantial assistance.     Follow Up Recommendations SNF    Equipment Recommendations  None recommended by PT    Recommendations for Other Services OT consult     Precautions / Restrictions Precautions Precautions: Fall Restrictions Weight Bearing Restrictions: No      Mobility  Bed Mobility Overal bed mobility: Needs Assistance Bed Mobility: Supine to Sit     Supine to sit: Min assist     General bed mobility comments: Assist to elevate trunk into sitting  Transfers Overall transfer level: Needs assistance Equipment used: Rolling walker (2 wheeled) Transfers: Sit to/from Stand Sit to Stand: Min assist;+2 safety/equipment         General transfer comment: Assist to bring hips up and for balance  Ambulation/Gait Ambulation/Gait assistance: Min assist;+2 safety/equipment Ambulation Distance (Feet): 10 Feet Assistive device: Rolling walker (2 wheeled) Gait Pattern/deviations: Step-through pattern;Decreased step length - right;Decreased step length - left;Shuffle;Trunk flexed Gait velocity: decr Gait velocity interpretation: <1.8 ft/sec, indicative of risk for recurrent falls General Gait Details: Verbal cues to stand more erect. SpO2 95% on RA with amb. Pt  fatigues quickly.  Stairs            Wheelchair Mobility    Modified Rankin (Stroke Patients Only)       Balance Overall balance assessment: Needs assistance Sitting-balance support: No upper extremity supported;Feet supported Sitting balance-Leahy Scale: Fair     Standing balance support: Bilateral upper extremity supported Standing balance-Leahy Scale: Poor Standing balance comment: walker and min A for static standing                             Pertinent Vitals/Pain Pain Assessment: No/denies pain    Home Living Family/patient expects to be discharged to:: Private residence Living Arrangements: Spouse/significant other Available Help at Discharge: Family (Unsure how much assist available) Type of Home: House Home Access: Level entry     Home Layout: One level        Prior Function Level of Independence: Independent               Hand Dominance        Extremity/Trunk Assessment   Upper Extremity Assessment: Generalized weakness           Lower Extremity Assessment: Generalized weakness         Communication      Cognition Arousal/Alertness: Awake/alert Behavior During Therapy: Anxious Overall Cognitive Status: Impaired/Different from baseline Area of Impairment: Memory;Problem solving     Memory: Decreased short-term memory       Problem Solving: Requires verbal cues;Slow processing      General Comments      Exercises        Assessment/Plan    PT Assessment Patient needs continued PT services  PT Diagnosis Difficulty walking;Generalized weakness   PT Problem List Decreased strength;Decreased activity tolerance;Decreased balance;Decreased mobility  PT Treatment Interventions DME instruction;Gait training;Therapeutic activities;Stair training;Functional mobility training;Therapeutic exercise;Balance training;Patient/family education   PT Goals (Current goals can be found in the Care Plan section) Acute  Rehab PT Goals Patient Stated Goal: Not stated PT Goal Formulation: With patient Time For Goal Achievement: 09/19/15 Potential to Achieve Goals: Good    Frequency Min 3X/week   Barriers to discharge        Co-evaluation               End of Session Equipment Utilized During Treatment: Gait belt Activity Tolerance: Patient limited by fatigue Patient left: in chair;with call bell/phone within reach;with chair alarm set;with nursing/sitter in room Nurse Communication: Mobility status         Time: [id number] PT Time Calculation (min) (ACUTE ONLY): 17 min   Charges:   PT Evaluation $PT Eval Moderate Complexity: 1 Procedure     PT G Codes:        Deniece Rankin 22-Sep-2015, 11:07 AM Suanne Marker PT [phone]

## 2015-09-12 NOTE — Care Management Important Message (Signed)
Important Message  Patient Details  Name: Teresa Franklin MRN: [id number] Date of Birth: [birth year]   Medicare Important Message Given:  Yes    [name], RN 09/12/2015, 11:02 AM

## 2015-09-13 ENCOUNTER — Inpatient Hospital Stay (HOSPITAL_COMMUNITY): Payer: Medicare Other

## 2015-09-13 ENCOUNTER — Other Ambulatory Visit: Payer: Self-pay

## 2015-09-13 ENCOUNTER — Telehealth: Payer: Self-pay

## 2015-09-13 DIAGNOSIS — E876 Hypokalemia: Secondary | ICD-10-CM | POA: Diagnosis present

## 2015-09-13 LAB — CBC
HCT: 28.7 % — ABNORMAL LOW (ref 36.0–46.0)
Hemoglobin: 9.2 g/dL — ABNORMAL LOW (ref 12.0–15.0)
MCH: 30.1 pg (ref 26.0–34.0)
MCHC: 32.1 g/dL (ref 30.0–36.0)
MCV: 93.8 fL (ref 78.0–100.0)
Platelets: 149 10*3/uL — ABNORMAL LOW (ref 150–400)
RBC: 3.06 MIL/uL — ABNORMAL LOW (ref 3.87–5.11)
RDW: 14.6 % (ref 11.5–15.5)
WBC: 7.8 10*3/uL (ref 4.0–10.5)

## 2015-09-13 LAB — BASIC METABOLIC PANEL
Anion gap: 8 (ref 5–15)
BUN: 17 mg/dL (ref 6–20)
CALCIUM: 8.7 mg/dL — AB (ref 8.9–10.3)
CHLORIDE: 105 mmol/L (ref 101–111)
CO2: 25 mmol/L (ref 22–32)
CREATININE: 0.88 mg/dL (ref 0.44–1.00)
GFR calc non Af Amer: 60 mL/min — ABNORMAL LOW (ref >60)
GLUCOSE: 115 mg/dL — AB (ref 65–99)
Potassium: 3.4 mmol/L — ABNORMAL LOW (ref 3.5–5.1)
Sodium: 138 mmol/L (ref 135–145)

## 2015-09-13 LAB — BLOOD GAS, ARTERIAL
Acid-Base Excess: 2.9 mmol/L — ABNORMAL HIGH (ref 0.0–2.0)
Bicarbonate: 26 mEq/L — ABNORMAL HIGH (ref 20.0–24.0)
Drawn by: 24485
O2 Content: 1 L/min
O2 Saturation: 91.8 %
Patient temperature: 98.6
TCO2: 27.1 mmol/L (ref 0–100)
pCO2 arterial: 33.8 mmHg — ABNORMAL LOW (ref 35.0–45.0)
pH, Arterial: 7.499 — ABNORMAL HIGH (ref 7.350–7.450)
pO2, Arterial: 60.4 mmHg — ABNORMAL LOW (ref 80.0–100.0)

## 2015-09-13 LAB — PHOSPHORUS: Phosphorus: 2.8 mg/dL (ref 2.5–4.6)

## 2015-09-13 LAB — TROPONIN I
TROPONIN I: 4.29 ng/mL — AB (ref <0.031)
Troponin I: 4.27 ng/mL (ref <0.031)

## 2015-09-13 MED ORDER — GUAIFENESIN 100 MG/5ML PO SOLN
5.0000 mL | ORAL | Status: DC | PRN
  Administered 2015-09-13 – 2015-09-16 (×7): 100 mg via ORAL
  Filled 2015-09-13 (×7): qty 5

## 2015-09-13 MED ORDER — POTASSIUM CHLORIDE CRYS ER 10 MEQ PO TBCR
10.0000 meq | EXTENDED_RELEASE_TABLET | Freq: Three times a day (TID) | ORAL | Status: AC
  Administered 2015-09-13 (×2): 10 meq via ORAL
  Filled 2015-09-13 (×3): qty 1

## 2015-09-13 MED ORDER — CLOPIDOGREL BISULFATE 75 MG PO TABS: 75.0000 mg | ORAL_TABLET | Freq: Every day | ORAL | Status: DC

## 2015-09-13 MED ORDER — HYDRALAZINE HCL 20 MG/ML IJ SOLN
10.0000 mg | Freq: Four times a day (QID) | INTRAMUSCULAR | Status: DC | PRN
  Administered 2015-09-17: 10 mg via INTRAVENOUS
  Filled 2015-09-13: qty 1

## 2015-09-13 MED ORDER — ATORVASTATIN CALCIUM 80 MG PO TABS: 80.0000 mg | ORAL_TABLET | Freq: Every day | ORAL | Status: DC

## 2015-09-13 MED ORDER — ALBUTEROL SULFATE (5 MG/ML) 0.5% IN NEBU
2.5000 mg | INHALATION_SOLUTION | Freq: Four times a day (QID) | RESPIRATORY_TRACT | Status: DC | PRN
  Administered 2015-09-14: 2.5 mg via RESPIRATORY_TRACT
  Filled 2015-09-13: qty 0.5
  Filled 2015-09-13 (×3): qty 3

## 2015-09-13 MED ORDER — LORAZEPAM 0.5 MG PO TABS
0.5000 mg | ORAL_TABLET | Freq: Once | ORAL | Status: AC
  Administered 2015-09-13: 0.5 mg via ORAL
  Filled 2015-09-13: qty 1

## 2015-09-13 MED ORDER — LEVALBUTEROL HCL 0.63 MG/3ML IN NEBU
INHALATION_SOLUTION | RESPIRATORY_TRACT | Status: AC
  Administered 2015-09-13: 0.63 mg
  Filled 2015-09-13: qty 3

## 2015-09-13 MED ORDER — NITROGLYCERIN 0.4 MG SL SUBL: 0.4000 mg | SUBLINGUAL_TABLET | SUBLINGUAL | Status: DC | PRN

## 2015-09-13 MED ORDER — METOCLOPRAMIDE HCL 5 MG PO TABS: 5.0000 mg | ORAL_TABLET | Freq: Three times a day (TID) | ORAL | Status: DC | PRN

## 2015-09-13 MED ORDER — ASPIRIN 81 MG PO CHEW: 81.0000 mg | CHEWABLE_TABLET | Freq: Every day | ORAL | Status: AC

## 2015-09-13 MED ORDER — FUROSEMIDE 10 MG/ML IJ SOLN
40.0000 mg | Freq: Once | INTRAMUSCULAR | Status: AC
  Administered 2015-09-13: 40 mg via INTRAVENOUS
  Filled 2015-09-13: qty 4

## 2015-09-13 NOTE — Progress Notes (Signed)
Pt daughter notified of pt's unwitnessed fall, knot found on posterior/crown area of head, and MD orders, RN will update pt daughter of any changes.    Izola Price, RN 09/13/2015

## 2015-09-13 NOTE — Progress Notes (Addendum)
Paged by nursing staff as patient complains her throat is closing up. Her voice is hoarse on exam. She was extubated 3 days ago. Lung exam shows mild wheezing. EKG showed persistent ST depression in lateral leads unchanged from prior EKG. She also says her stomach is tender for the past few days.   Will order ABG, CXR, CBC and Xopenex breathing treatment.   Hilbert Corrigan PA Pager: [phone]   CXR reviewed by Dr. [place], felt patient is wet. Will give IV lasix  Hilbert Corrigan PA Pager: [phone]   ABG and CBC reviewed, relatively stable. Mild respiratory alkylosis. Hgb stable. WBC normal despite an episode of fever. Will continue to observe  Signed, Almyra Deforest PA Pager: [phone]

## 2015-09-13 NOTE — Progress Notes (Signed)
Offered to walk with pt however she declined. Sts she has been nauseated all day. Son sts he is afraid this is her angina. Encouraged pt to walk this pm if she feels better.  Yves Dill CES, ACSM 2:39 PM 09/13/2015

## 2015-09-13 NOTE — Progress Notes (Signed)
Patient is resting comfortably. Nausea has improved. Second EKG was done at 1500 and was the same as the first one. Called placed to daughter, Jan, for an update but was not answered. Will try again later.

## 2015-09-13 NOTE — Progress Notes (Addendum)
Progress Note    Teresa Franklin  [id number] DOB: Mar 27, 1930  DOA: 09/08/2015 PCP: [name], MD    Brief Narrative:   Teresa Franklin is an 82 y.o. female with h/o subarachnoid hemorrhage in March 2015 in the setting of a fall, HTN and HLD Who was admitted 09/08/15 for NSTEMI and CHB/ cardiogenic shock.   Assessment/Plan:   Principal Problem:   NSTEMI (non-ST elevated myocardial infarction) ([place]) Acute marginal RCA branch successfully stented, Dr. Ellyn Hack. 75% residual proximal LAD lesion-no current plans for PCI to LAD (distal vessel fairly small and if symptoms develop as outpatient, consider rotational atherectomy of proximal LAD. Repeat echocardiogram normal EF (55-60%), reassuring. Previously normal EF. No beta blocker secondary to bradycardia. Continue statin and dual antiplatelet therapy.  Active Problems:   Hypokalemia Replete.    Cough OK to use Robitussin PRN, but avoid Robitussin DM.    Nausea Appears to be chronic, but son concerned that these are the symptoms she had before she had a heart attack.  OK to use Reglan PRN.    Hyperlipidemia with target LDL less than 70 Continue statin.    Complete heart block (HCC) / heart block AV second-degree Observed in the Cath Lab. Temporary wire was discontinued but then had to be replaced on 09/09/15 because of hypotension associated with 2:1 AVB and HR of 45 bpm despite dopamine 20. Maintaining sinus rhythm. Family indicates that they would not want a permanent pacemaker placed. Avoid beta blockers.    Essential hypertension Managing with Lasix.    Cardiogenic shock ([place]) Needed dopamine to resolve. 2-D echo showed normal EF at 55-60 percent.    Acute respiratory failure with hypoxia (HCC)/Acute respiratory failure (HCC) Likely cardiogenic in origin from elevated left ventricular end-diastolic pressure, in setting ischemia. Successfully extubated 09/10/15. Chest x-ray done 09/11/15 which showed persistent pulmonary  edema. Diuresis per cardiology. No plans for reintubation should she decompensate per family.    Hypothyroidism Continue Synthroid.    Hypophosphatemia Repleted.   Family Communication/Anticipated D/C date and plan/Code Status   DVT prophylaxis: Lovenox ordered. Code Status: DNR Family Communication: Son updated by telephone. Disposition Plan: Per primary team.   Medical Consultants:   We are consulting.  Cardiology is primary.   Procedures:   2 D Echo 09/09/15  Study Conclusions  - Left ventricle: The cavity size was normal. There was mild  concentric hypertrophy of the septum. Systolic function was  normal. The estimated ejection fraction was in the range of 55%  to 60%. Wall motion was normal; there were no regional wall  motion abnormalities. - Aortic valve: Transvalvular velocity was within the normal range.  There was no stenosis. There was no regurgitation. - Mitral valve: There was moderate regurgitation. Effective  regurgitant orifice (PISA): 0.26 cm^2. Regurgitant volume (PISA):  39 ml. - Left atrium: The atrium was mildly dilated. - Right ventricle: The cavity size was normal. Wall thickness was  normal. Systolic function was normal. - Right atrium: The atrium was mildly dilated. - Tricuspid valve: There was moderate regurgitation. - Pulmonary arteries: Systolic pressure was moderately to severely  increased. PA peak pressure: 58 mm Hg (S). - Inferior vena cava: The vessel was dilated. - Pericardium, extracardiac: There was a left pleural effusion.  Anti-Infectives:   Anti-infectives    None      Subjective:   Teresa Franklin tells me she has a cough and nausea today.  Says she has had nausea since her admission, and that  Reglan "sometimes helps". No chest pain.  No dyspnea.  Objective:    Filed Vitals:   09/13/15 0515 09/13/15 0518 09/13/15 0529 09/13/15 0617  BP: 178/77 167/85 176/82 154/84  Pulse: 87     Temp: 98.4 F (36.9 C)      TempSrc: Oral     Resp: 20     Height:      Weight:      SpO2: 97%       Intake/Output Summary (Last 24 hours) at 09/13/15 0839 Last data filed at 09/13/15 0800  Gross per 24 hour  Intake    120 ml  Output    201 ml  Net    -81 ml   Filed Weights   09/09/15 0500 09/10/15 0447 09/11/15 0500  Weight: 63 kg (138 lb 14.2 oz) 62.4 kg (137 lb 9.1 oz) 62.3 kg (137 lb 5.6 oz)    Exam: General exam: Appears anxious, moaning out.  Respiratory system: Clear to auscultation. Respiratory effort normal. Cardiovascular system: S1 & S2 heard, RRR. No JVD,  rubs, gallops or clicks. No murmurs. Gastrointestinal system: Abdomen is nondistended, soft and nontender. No organomegaly or masses felt. Normal bowel sounds heard. Central nervous system: Alert and oriented to self and place. No focal neurological deficits. Extremities: No clubbing, edema, or cyanosis. Skin: No rashes, lesions or ulcers Psychiatry: Judgement and insight appear normal. Mood & affect anxious.   Data Reviewed:   I have personally reviewed following labs and imaging studies:  Labs: Basic Metabolic Panel:  Recent Labs Lab 09/09/15 0350  09/09/15 2120 09/10/15 0357 09/10/15 2145 09/11/15 0440 09/12/15 0337 09/13/15 0342  NA 136  --   --  136  --  139 140 138  K 3.2*  --   --  4.0  --  3.7 3.5 3.4*  CL 108  --   --  110  --  109 105 105  CO2 21*  --   --  21*  --  22 25 25   GLUCOSE 151*  --   --  160*  --  102* 132* 115*  BUN 13  --   --  25*  --  28* 29* 17  CREATININE 0.89  --   --  1.02*  --  0.99 1.06* 0.88  CALCIUM 8.4*  --   --  8.3*  --  8.2* 8.6* 8.7*  MG 1.5*  < > 3.3* 2.7* 2.1 2.2 2.0  --   PHOS 2.2*  < > 3.0 2.4* 2.1* 2.8 2.4* 2.8  < > = values in this interval not displayed. GFR Estimated Creatinine Clearance: 42.6 mL/min (by C-G formula based on Cr of 0.88). Liver Function Tests:  Recent Labs Lab 09/06/15 1245 09/09/15 0350 09/10/15 0357  AST 140* 80* 46*  ALT 27 46 31  ALKPHOS 64 79 71    BILITOT 0.7 1.0 0.9  PROT 7.2 5.3* 5.0*  ALBUMIN 4.5 2.6* 2.2*    Recent Labs Lab 09/06/15 1245  LIPASE 27   No results for input(s): AMMONIA in the last 168 hours. Coagulation profile  Recent Labs Lab 09/06/15 1803  INR 1.18    CBC:  Recent Labs Lab 09/07/15 0522 09/08/15 0611 09/08/15 1159 09/08/15 1415 09/09/15 0350 09/10/15 0357  WBC 7.8 7.3 9.7  --  11.5* 7.8  NEUTROABS  --   --   --   --   --  6.3  HGB 12.4 10.2* 9.8* 10.2* 10.2* 9.0*  HCT 35.8 29.7* 28.9* 30.0* 29.7*  27.7*  MCV 90.1 91.7 91.4  --  89.2 93.0  PLT 75* 69* 76*  --  116* 108*   Cardiac Enzymes:  Recent Labs Lab 09/06/15 1358 09/06/15 1803 09/06/15 2216 09/07/15 0522  TROPONINI 8.30* 10.87* 10.67* 14.97*   CBG:  Recent Labs Lab 09/10/15 1542 09/10/15 2139 09/11/15 0052 09/11/15 0444 09/11/15 0725  GLUCAP 112* 122* 113* 112* 85   SMicrobiology Recent Results (from the past 240 hour(s))  Surgical pcr screen     Status: None   Collection Time: 09/08/15 12:16 PM  Result Value Ref Range Status   MRSA, PCR NEGATIVE NEGATIVE Final   Staphylococcus aureus NEGATIVE NEGATIVE Final    Comment:        The Xpert SA Assay (FDA approved for NASAL specimens in patients over 54 years of age), is one component of a comprehensive surveillance program.  Test performance has been validated by [facility] for patients greater than or equal to 42 year old. It is not intended to diagnose infection nor to guide or monitor treatment.   MRSA PCR Screening     Status: None   Collection Time: 09/08/15  5:03 PM  Result Value Ref Range Status   MRSA by PCR NEGATIVE NEGATIVE Final    Comment:        The GeneXpert MRSA Assay (FDA approved for NASAL specimens only), is one component of a comprehensive MRSA colonization surveillance program. It is not intended to diagnose MRSA infection nor to guide or monitor treatment for MRSA infections.     Radiology: No results  found.  Medications:   . antiseptic oral rinse  7 mL Mouth Rinse BID  . aspirin  81 mg Oral Daily  . atorvastatin  80 mg Oral q1800  . clopidogrel  75 mg Oral Q breakfast  . enoxaparin (LOVENOX) injection  40 mg Subcutaneous Q24H  . feeding supplement  1 Container Oral BID BM  . folic acid-pyridoxine-cyancobalamin  1 tablet Oral Daily  . levothyroxine  25 mcg Oral QAC breakfast  . magnesium oxide  200 mg Oral Daily  . multivitamin  1 tablet Oral Daily  . pantoprazole sodium  40 mg Per Tube Daily  . polyethylene glycol  17 g Oral Daily   Continuous Infusions:   Time spent: 25 minutes.   LOS: 5 days   [facility] Pager [phone]. If unable to reach me by pager, please call my cell phone at [phone].  *Please refer to [email or web address], password [email or web address] to get updated schedule on who will round on this patient, as hospitalists switch teams weekly. If 7PM-7AM, please contact night-coverage at [email or web address], password [email or web address] for any overnight needs.  09/13/2015, 8:39 AM

## 2015-09-13 NOTE — Progress Notes (Addendum)
Patient Profile: 82 y/o female with h/o subarachnoid hemorrhage in March 2015 setting of a fall, also HTN and HLD  admitted for NSTEMI and CHB/ cardiogenic shock.   Subjective: Feels ok. Only complaint is a nonproductive cough. No CP or dyspnea.    Objective: Vital signs in last 24 hours: Temp:  [98.4 F (36.9 C)-99.2 F (37.3 C)] 98.4 F (36.9 C) (06/23 0515) Pulse Rate:  [76-87] 87 (06/23 0515) Resp:  [20] 20 (06/23 0515) BP: (154-178)/(77-86) 154/84 mmHg (06/23 0617) SpO2:  [93 %-97 %] 97 % (06/23 0515) Last BM Date: 09/12/15  Intake/Output from previous day:   Intake/Output this shift:    Medications Current Facility-Administered Medications  Medication Dose Route Frequency Provider Last Rate Last Dose  . acetaminophen (TYLENOL) tablet 650 mg  650 mg Oral Q4H PRN Lorretta Harp, MD   650 mg at 09/12/15 0324  . antiseptic oral rinse (CPC / CETYLPYRIDINIUM CHLORIDE 0.05%) solution 7 mL  7 mL Mouth Rinse BID Leonie Man, MD   7 mL at 09/12/15 2200  . aspirin chewable tablet 81 mg  81 mg Oral Daily Rhonda G Barrett, PA-C   81 mg at 09/12/15 1006  . atorvastatin (LIPITOR) tablet 80 mg  80 mg Oral q1800 Rhonda G Barrett, PA-C   80 mg at 09/09/15 1836  . clopidogrel (PLAVIX) tablet 75 mg  75 mg Oral Q breakfast Evelene Croon Barrett, PA-C   75 mg at 09/12/15 0803  . enoxaparin (LOVENOX) injection 40 mg  40 mg Subcutaneous Q24H Rhonda G Barrett, PA-C   40 mg at 09/12/15 1936  . feeding supplement (BOOST / RESOURCE BREEZE) liquid 1 Container  1 Container Oral BID BM Raylene Miyamoto, MD   1 Container at 09/11/15 1400  . folic acid-pyridoxine-cyancobalamin (FOLTX) 2.5-25-2 MG per tablet 1 tablet  1 tablet Oral Daily Evelene Croon Barrett, PA-C   1 tablet at 09/12/15 1006  . levothyroxine (SYNTHROID, LEVOTHROID) tablet 25 mcg  25 mcg Oral QAC breakfast Evelene Croon Barrett, PA-C   25 mcg at 09/12/15 0803  . LORazepam (ATIVAN) injection 0.5 mg  0.5 mg Intravenous BID PRN Raylene Miyamoto, MD   0.5 mg at 09/12/15 2240  . magnesium oxide (MAG-OX) tablet 200 mg  200 mg Oral Daily Rhonda G Barrett, PA-C   200 mg at 09/12/15 1006  . metoCLOPramide (REGLAN) tablet 10 mg  10 mg Oral Q6H PRN Eileen Stanford, PA-C   10 mg at 09/12/15 1811  . multivitamin (PROSIGHT) tablet 1 tablet  1 tablet Oral Daily Isaiah Serge, NP   1 tablet at 09/12/15 1006  . nitroGLYCERIN (NITROSTAT) SL tablet 0.4 mg  0.4 mg Sublingual Q5 Min x 3 PRN Rhonda G Barrett, PA-C      . ondansetron (ZOFRAN) injection 4 mg  4 mg Intravenous Q6H PRN Rhonda G Barrett, PA-C   4 mg at 09/12/15 1413  . pantoprazole sodium (PROTONIX) 40 mg/20 mL oral suspension 40 mg  40 mg Per Tube Daily Rhonda G Barrett, PA-C   40 mg at 09/12/15 1006  . polyethylene glycol (MIRALAX / GLYCOLAX) packet 17 g  17 g Oral Daily Raylene Miyamoto, MD   17 g at 09/12/15 1006  . promethazine (PHENERGAN) tablet 25 mg  25 mg Oral Q6H PRN Eileen Stanford, PA-C      . zolpidem (AMBIEN) tablet 5 mg  5 mg Oral QHS PRN Evelene Croon Barrett, PA-C   5 mg at 09/12/15  0057    PE: General appearance: alert, cooperative and elderly  Lungs: clear to auscultation bilaterally Heart: regular rate and rhythm, S1, S2 normal, no murmur, click, rub or gallop Abdomen: soft, non-tender; bowel sounds normal; no masses,  no organomegaly Extremities: no LEE Pulses: 2+ and symmetric Skin: warm and dry Neurologic: Grossly normal  Lab Results:  No results for input(s): WBC, HGB, HCT, PLT in the last 72 hours. BMET  Recent Labs  09/11/15 0440 09/12/15 0337 09/13/15 0342  NA 139 140 138  K 3.7 3.5 3.4*  CL 109 105 105  CO2 22 25 25   GLUCOSE 102* 132* 115*  BUN 28* 29* 17  CREATININE 0.99 1.06* 0.88  CALCIUM 8.2* 8.6* 8.7*     Assessment/Plan  Principal Problem:   NSTEMI (non-ST elevated myocardial infarction) (HCC) Active Problems:   Hyperlipidemia with target LDL less than 70   Complete heart block (HCC)   Heart block AV second degree    Essential hypertension   Cardiogenic shock (HCC)   Acute respiratory failure with hypoxia (HCC)   Acute respiratory failure (HCC)   Encounter for intubation   Hypophosphatemia   1. NSTEMI - Acute marginal RCA branch successfully stented, Dr. Ellyn Hack - 75% residual proximal LAD lesion-discussed with Dr. Ellyn Hack. Discussed as well with Dr. Angelena Form and Carlene Coria. At this time, will forgo PCI to LAD (distal vessel fairly small and if symptoms develop as outpatient, consider rotational atherectomy of proximal LAD. However, this would jail some significant diagonal branches as well. Will NOT pursue further PCI. - She is CP Free  - Dual antiplatelet therapy - Findings on ECG are suggestive of acute injury inferior, also Q waves inferiorly - Troponin 15 - Repeat echocardiogram normal EF (55-60%), reassuring. Previously normal EF. Hemoglobin mild decrease.  - AVOIDING Coreg because of bradycardia.  - Continue ASA, Plavix and Statin.    2. Cardiogenic Shock: - Low-dose dopamine off. Resolved.  - Urine output 1.5 L yesterday, now net negative 1.1L.  - ECHO EF normal, EF 55-60%. Reassuring.   3.  Complete Heart Block/Second Degree AV Block [place] 1/ 2:1 AVB as well: - Complete heart block, this was seen in the Cath Lab. Temporary wire was discontinued but then had to be replaced on 6/19 because of hypotension associated with 2:1 AVB and HR of 45bpm despite dopamine 20.  - Hopefully with PCI to RCA acute marginal branch, this will decrease risk of future complete AV block.  - Currently on telemetry she is demonstrating sinus rhythm - Off amiodarone use in this setting worsening overall AV block. Off carvedilol as well.  - NSR on telemetry - DC temp wire discontinued. Family agree with not advancing treatment. Would not want perm pacer.  4. Acute Respiratory Failure: - Likely cardiogenic in origin from elevated left ventricular end-diastolic pressure, in setting ischemia.   - Lasix 40 BID again today. Hopeful reduction tomorrow. Breathing improved. Extubated 6/20.  - Appreciate critical care medicine discussed with CCM team.  - Chest x-ray 09/11/15 shows continued continued pulmonary edema.  - Discussed with Jan, [place]. Family in agreement.   5. Confusion/Delirium: Improved. Sitter PRN.   6. Code Status: DNR  7. Dispo:  Plan for d/c to SNF once bed is available and MD clearance is given.   LOS: 5 days    Brittainy M. Ladoris Gene 09/13/2015 7:08 AM  Personally seen and examined. Agree with above. Mild chronic nausea and anxiety yesterday Feeling better, but asks if we  can give her something for her cold, cough. Will try robitussin.  OK with DC to SNF when bed available.   Candee Furbish, MD

## 2015-09-13 NOTE — Telephone Encounter (Signed)
Patient contacted regarding discharge from [facility] on 09/13/15.  Spoke w/ pt's husband.  He states that pt has not been d/c'd from hospital yet, he has not been told when she will be d/c'd.  Advised him of appt w/ on 7/10 w/ Dr. Fletcher Anon, he states that he did not know anything about this and that pt will not be keeping this appt. Advised him that this appt was sched at the hospital, but he states that pt will not keep as "she's still in the damn hospital". Apologized for calling him.

## 2015-09-13 NOTE — Telephone Encounter (Signed)
-----   Message from Blain Pais sent at 09/13/2015  2:52 PM EDT ----- Regarding: tcm/ph 7/10 2:30 Dr. Fletcher Anon

## 2015-09-13 NOTE — Progress Notes (Signed)
RN and NT enter room quickly after hearing bed alarm (chair alarm pad) alert and room light illuminated, pt is standing holding onto the side of the bed, when asked pt reported that she was trying to get up to go to the bathroom, pt assisted to bedside commode, pt re-educated on the need to call for assistance, while sitting on bedside commode pt was rubbing the back of her head, RN asked the pt why she was rubbing the back of her head and pt reported that she just hit her head, when asked about how she fell pt unable to explain how she fell or hit her head but reports that she hit it on the bedside table, RN assessed pt and head, pt alert and oriented x4, pt has slightly raised knot with skin intact on the posterior/crown area of her head, no other evidence of fall noted, RN reiterated and re-educated pt on need to call for assistance to stay safe, MD notified and placed orders, orders initiated, RN will continue to monitor, pt call bell within reach and bed alarm (chair alarm pad) in use.    09/13/2015 Izola Price, RN

## 2015-09-13 NOTE — Progress Notes (Signed)
Patient complained of tightness in her throat. Rapid Response and the PA were notified. Vitals signs were stable. Rectal temperature of 100.9. Chest X-ray was done and the physician ordered IV lasix. Update was given to the daughter, the POA. She stated that she would like a physician to call her tomorrow. Will continue to monitor.

## 2015-09-13 NOTE — Significant Event (Signed)
Rapid Response Event Note  Overview:  Called to assist with patient with resp distress Time Called: [id number] Arrival Time: [id number] Event Type: Respiratory  Initial Focused Assessment:  On arrival patient pale cool and dry alert and oriented and appropriate - c/o feeling sick all over and SOB - mild resp distress - upper airway pseudo wheeze - no stridor - bil BS present - clear upper lobes - some fine crackles LL posterior - abd soft - right groin bruising left hip bruising noted patient reports from a "shot" at urgent care.  Oral mucosa very dry.  Hoarse voice - staff report has worsened.  SR on monitor.  12 lead done prior to arrival - no new changes noted - confirmed by PA Meng.  No pedal edema - some JVD noted.   Rectal temp 100.9. 167/90  HR 85 RR 24.  O2 sats 94% on 1 liter nasal cannula.    Interventions: Repositioned in bed - PA Almyra Deforest at bedside - stat call for PCXR, ABG, CBC and troponin.  Xoepenex 0.63 mg nebulizer given after ABG done.  Dr. Martinique to bedside - labs done - patient feeling better - decreased WOB - speaking better.  #22 angiocath inserted right hand to [place].  Lasix 40 mg IV given per order Dr. Martinique.  Patient resting comfortably.  ABG noted.  Handoff to [employer].  Plan of Care (if not transferred):  VS q 1 hour x 2 to with O2 sat monitor.  Staff to call for increased WOB or acute changes.  RRT will follow q 4 hours x2.    Event Summary: Name of Physician Notified: Dr. Martinique    PA Almyra Deforest at 1745    at    Outcome: Stayed in room and stabalized  Event End Time: [id number]  Quin Hoop

## 2015-09-13 NOTE — Progress Notes (Signed)
Called placed to Teresa Franklin for an update on her mother. Will call back with results of the troponin.

## 2015-09-13 NOTE — Progress Notes (Signed)
Lab called with a Troponin of 4.29. PA has been notified. Will continue to monitor and perform a EKG.

## 2015-09-13 NOTE — Progress Notes (Signed)
  Was notified by RN that troponin came back abnormal at 4.29. I was not aware a troponin had been ordered. It was ordered by IM as patient noted nausea, similar to her symptoms when she initially presented. No CP no dyspnea. Nausea improved with phenergan. Her previous troponin was 14, 6 days ago, thus this may be residual/downtrending from her initial NSTEMI. EKG has been obtained which shows NSR and subtle ST depressions anterior leads. No elevations. Recommended serial EKGs and repeat troponin in 3 hrs.   Audyn Dimercurio Rosita Fire 09/13/2015

## 2015-09-13 NOTE — Progress Notes (Signed)
Patient's son expressed concern over his mother's nausea and abdominal pain. He stated that this was the same as when she had her nstemi. He wanted the physician notified. Vital signs were normal and no temperature. Physician ordered troponins. Will continue monitor.

## 2015-09-14 ENCOUNTER — Other Ambulatory Visit (HOSPITAL_COMMUNITY): Payer: Medicare Other

## 2015-09-14 DIAGNOSIS — W19XXXA Unspecified fall, initial encounter: Secondary | ICD-10-CM | POA: Diagnosis present

## 2015-09-14 DIAGNOSIS — I63511 Cerebral infarction due to unspecified occlusion or stenosis of right middle cerebral artery: Secondary | ICD-10-CM | POA: Diagnosis present

## 2015-09-14 DIAGNOSIS — G459 Transient cerebral ischemic attack, unspecified: Secondary | ICD-10-CM

## 2015-09-14 DIAGNOSIS — W19XXXD Unspecified fall, subsequent encounter: Secondary | ICD-10-CM

## 2015-09-14 LAB — TROPONIN I: TROPONIN I: 3.46 ng/mL — AB (ref <0.031)

## 2015-09-14 MED ORDER — METOCLOPRAMIDE HCL 5 MG/ML IJ SOLN
5.0000 mg | Freq: Four times a day (QID) | INTRAMUSCULAR | Status: DC | PRN
  Administered 2015-09-14: 5 mg via INTRAVENOUS
  Filled 2015-09-14: qty 2

## 2015-09-14 MED ORDER — PROMETHAZINE HCL 6.25 MG/5ML PO SYRP
12.5000 mg | ORAL_SOLUTION | Freq: Four times a day (QID) | ORAL | Status: DC | PRN
  Administered 2015-09-18: 12.5 mg via ORAL
  Filled 2015-09-14 (×2): qty 10

## 2015-09-14 NOTE — Progress Notes (Signed)
Progress Note    Teresa Franklin  [id number] DOB: December 24, 1930  DOA: 09/08/2015 PCP: [name], MD    Brief Narrative:   Teresa Franklin is an 82 y.o. female with h/o subarachnoid hemorrhage in March 2015 in the setting of a fall, HTN and HLD Who was admitted 09/08/15 for NSTEMI and CHB/ cardiogenic shock.   Assessment/Plan:   Principal Problem:   NSTEMI (non-ST elevated myocardial infarction) ([place]) Acute marginal RCA branch successfully stented, Dr. Ellyn Hack. 75% residual proximal LAD lesion-no current plans for PCI to LAD (distal vessel fairly small and if symptoms develop as outpatient, consider rotational atherectomy of proximal LAD. Repeat echocardiogram normal EF (55-60%), reassuring. Previously normal EF. No beta blocker secondary to bradycardia. Continue statin and dual antiplatelet therapy.Chest x-ray done 09/13/15 which showed mild pulmonary edema for which the patient received Lasix.  Active Problems:   Hypokalemia Replete.    Cough/Hoarseness OK to use Robitussin PRN, but avoid Robitussin DM. Overnight, and ABG, chest x-ray and a CBC were done and she was given Xopenex to address some shortness of breath. Chest x-ray showed mild pulmonary edema but decreased in amount.  Right temporal lobe cerebral infarction  Cross covering provider ordered a CT of the head which showed an evolving acute or subacute infarct at the right medial temporal lobe, prompting a neurology consultation. No other focal deficits were noted. MRI/MRA, carotid Dopplers subsequently ordered. 2-D echo negative for cardiac source of emboli. LDL 54, hemoglobin A1c pending. Already on dual antiplatelet therapy.    Nausea Appears to be chronic, but son concerned that these are the symptoms she had before she had a heart attack.  OK to use Reglan PRN. Cycled troponins every 6 hours 3 overnight and while elevated, the trend is down from her admission values.  Will change reglan to IV route and phenergan to  liquid form.    Hyperlipidemia with target LDL less than 70 Continue statin.    Complete heart block (HCC) / heart block AV second-degree Observed in the Cath Lab. Temporary wire was discontinued but then had to be replaced on 09/09/15 because of hypotension associated with 2:1 AVB and HR of 45 bpm despite dopamine 20. Maintaining sinus rhythm. Family indicates that they would not want a permanent pacemaker placed. Avoid beta blockers.    Essential hypertension Managing with Lasix.    Cardiogenic shock ([place]) Needed dopamine to resolve. 2-D echo showed normal EF at 55-60 percent.    Acute respiratory failure with hypoxia (HCC)/Acute respiratory failure (HCC) Likely cardiogenic in origin from elevated left ventricular end-diastolic pressure, in setting ischemia. Successfully extubated 09/10/15. Chest x-ray done 09/11/15 which showed persistent pulmonary edema. Diuresis per cardiology. No plans for reintubation should she decompensate per family.    Hypothyroidism Continue Synthroid.    Hypophosphatemia Repleted.   Family Communication/Anticipated D/C date and plan/Code Status   DVT prophylaxis: Lovenox ordered. Code Status: DNR Family Communication: Son updated by telephone 09/13/15.  Jan, daughter, updated by telephone today. Disposition Plan: Per primary team.   Medical Consultants:   We are consulting.  Cardiology is primary.   Procedures:   2 D Echo 09/09/15  Study Conclusions  - Left ventricle: The cavity size was normal. There was mild  concentric hypertrophy of the septum. Systolic function was  normal. The estimated ejection fraction was in the range of 55%  to 60%. Wall motion was normal; there were no regional wall  motion abnormalities. - Aortic valve: Transvalvular velocity was within the  normal range.  There was no stenosis. There was no regurgitation. - Mitral valve: There was moderate regurgitation. Effective  regurgitant orifice (PISA): 0.26 cm^2.  Regurgitant volume (PISA):  39 ml. - Left atrium: The atrium was mildly dilated. - Right ventricle: The cavity size was normal. Wall thickness was  normal. Systolic function was normal. - Right atrium: The atrium was mildly dilated. - Tricuspid valve: There was moderate regurgitation. - Pulmonary arteries: Systolic pressure was moderately to severely  increased. PA peak pressure: 58 mm Hg (S). - Inferior vena cava: The vessel was dilated. - Pericardium, extracardiac: There was a left pleural effusion.  Anti-Infectives:   Anti-infectives    None      Subjective:   Teresa Franklin tells me she is hoarse, nausea somewhat improved. No chest pain.  No dyspnea.  Objective:    Filed Vitals:   09/13/15 1823 09/13/15 2049 09/14/15 0158 09/14/15 0653  BP:  174/84 149/70 156/80  Pulse:  98 82 84  Temp: 100.9 F (38.3 C) 98.2 F (36.8 C)  98.5 F (36.9 C)  TempSrc: Rectal Oral  Oral  Resp:  18 18 20   Height:      Weight:      SpO2:  98% 96% 95%    Intake/Output Summary (Last 24 hours) at 09/14/15 0820 Last data filed at 09/13/15 1700  Gross per 24 hour  Intake     60 ml  Output    200 ml  Net   -140 ml   Filed Weights   09/09/15 0500 09/10/15 0447 09/11/15 0500  Weight: 63 kg (138 lb 14.2 oz) 62.4 kg (137 lb 9.1 oz) 62.3 kg (137 lb 5.6 oz)    Exam: General exam: Appears more comfortable today.  Hoarse.  Respiratory system: Clear to auscultation. Respiratory effort normal. Cardiovascular system: S1 & S2 heard, RRR. No JVD,  rubs, gallops or clicks. No murmurs. Gastrointestinal system: Abdomen is nondistended, soft and nontender. No organomegaly or masses felt. Normal bowel sounds heard. Central nervous system: Alert and oriented to self and place. No focal neurological deficits. Extremities: No clubbing, edema, or cyanosis. Skin: No rashes, lesions or ulcers Psychiatry: Judgement and insight appear normal. Mood & affect anxious.   Data Reviewed:   I have personally  reviewed following labs and imaging studies:  Labs: Basic Metabolic Panel:  Recent Labs Lab 09/09/15 0350  09/09/15 2120 09/10/15 0357 09/10/15 2145 09/11/15 0440 09/12/15 0337 09/13/15 0342  NA 136  --   --  136  --  139 140 138  K 3.2*  --   --  4.0  --  3.7 3.5 3.4*  CL 108  --   --  110  --  109 105 105  CO2 21*  --   --  21*  --  22 25 25   GLUCOSE 151*  --   --  160*  --  102* 132* 115*  BUN 13  --   --  25*  --  28* 29* 17  CREATININE 0.89  --   --  1.02*  --  0.99 1.06* 0.88  CALCIUM 8.4*  --   --  8.3*  --  8.2* 8.6* 8.7*  MG 1.5*  < > 3.3* 2.7* 2.1 2.2 2.0  --   PHOS 2.2*  < > 3.0 2.4* 2.1* 2.8 2.4* 2.8  < > = values in this interval not displayed. GFR Estimated Creatinine Clearance: 42.6 mL/min (by C-G formula based on Cr of 0.88). Liver Function  Tests:  Recent Labs Lab 09/09/15 0350 09/10/15 0357  AST 80* 46*  ALT 46 31  ALKPHOS 79 71  BILITOT 1.0 0.9  PROT 5.3* 5.0*  ALBUMIN 2.6* 2.2*   CBC:  Recent Labs Lab 09/08/15 0611 09/08/15 1159 09/08/15 1415 09/09/15 0350 09/10/15 0357 09/13/15 1910  WBC 7.3 9.7  --  11.5* 7.8 7.8  NEUTROABS  --   --   --   --  6.3  --   HGB 10.2* 9.8* 10.2* 10.2* 9.0* 9.2*  HCT 29.7* 28.9* 30.0* 29.7* 27.7* 28.7*  MCV 91.7 91.4  --  89.2 93.0 93.8  PLT 69* 76*  --  116* 108* 149*   Cardiac Enzymes:  Recent Labs Lab 09/13/15 1240 09/13/15 1910 09/14/15 0014  TROPONINI 4.29* 4.27* 3.46*   CBG:  Recent Labs Lab 09/10/15 1542 09/10/15 2139 09/11/15 0052 09/11/15 0444 09/11/15 0725  GLUCAP 112* 122* 113* 112* 85   SMicrobiology Recent Results (from the past 240 hour(s))  Surgical pcr screen     Status: None   Collection Time: 09/08/15 12:16 PM  Result Value Ref Range Status   MRSA, PCR NEGATIVE NEGATIVE Final   Staphylococcus aureus NEGATIVE NEGATIVE Final    Comment:        The Xpert SA Assay (FDA approved for NASAL specimens in patients over 69 years of age), is one component of a comprehensive  surveillance program.  Test performance has been validated by [facility] for patients greater than or equal to 67 year old. It is not intended to diagnose infection nor to guide or monitor treatment.   MRSA PCR Screening     Status: None   Collection Time: 09/08/15  5:03 PM  Result Value Ref Range Status   MRSA by PCR NEGATIVE NEGATIVE Final    Comment:        The GeneXpert MRSA Assay (FDA approved for NASAL specimens only), is one component of a comprehensive MRSA colonization surveillance program. It is not intended to diagnose MRSA infection nor to guide or monitor treatment for MRSA infections.     Radiology: Ct Head Wo Contrast  09/13/2015  CLINICAL DATA:  Status post fall, with severe headache. Concern for head injury. Initial encounter. EXAM: CT HEAD WITHOUT CONTRAST TECHNIQUE: Contiguous axial images were obtained from the base of the skull through the vertex without intravenous contrast. COMPARISON:  CT of the head performed 03/26/2013, and MRI of the brain performed 08/31/2013 FINDINGS: There is an evolving acute or subacute infarct at the medial right temporal lobe. A tiny focus of increased attenuation along the posterior aspect of the infarct is similar in appearance to prior studies and likely reflects calcification, without evidence of hemorrhagic transformation. Prominence of the ventricles and sulci suggests mild cortical volume loss. Mild subcortical white matter change likely reflects small vessel ischemic microangiopathy. The brainstem and fourth ventricle are within normal limits. The basal ganglia are unremarkable in appearance. No midline shift is seen. There is no evidence of fracture; visualized osseous structures are unremarkable in appearance. The orbits are within normal limits. The paranasal sinuses and mastoid air cells are well-aerated. No significant soft tissue abnormalities are seen. IMPRESSION: 1. Evolving acute or subacute infarct at the medial right  temporal lobe. Tiny focus of increased attenuation along the posterior aspect of the infarct is similar in appearance to prior studies and likely reflects calcification, without evidence of hemorrhage transformation. 2. Mild cortical volume loss and scattered small vessel ischemic microangiopathy. These results were called by  telephone at the time of interpretation on 09/13/2015 at 10:54 pm to Dr. Rosanna Randy, who verbally acknowledged these results. Electronically Signed   By: Garald Balding M.D.   On: 09/13/2015 22:55   Dg Chest Port 1 View  09/13/2015  CLINICAL DATA:  Shortness of breath EXAM: PORTABLE CHEST 1 VIEW COMPARISON:  09/11/2015 chest radiograph. FINDINGS: Slightly low lung volumes. Stable cardiomediastinal silhouette with mild cardiomegaly. No pneumothorax. Stable small bilateral pleural effusions. Mild pulmonary edema, decreased. Stable patchy bibasilar lung opacities. IMPRESSION: 1. Stable mild cardiomegaly and decreased mild pulmonary edema, suggesting improving but persistent mild congestive heart failure. 2. Stable small bilateral pleural effusions and patchy bibasilar lung opacities favoring atelectasis. Electronically Signed   By: Ilona Sorrel M.D.   On: 09/13/2015 18:53    Medications:   . antiseptic oral rinse  7 mL Mouth Rinse BID  . aspirin  81 mg Oral Daily  . atorvastatin  80 mg Oral q1800  . clopidogrel  75 mg Oral Q breakfast  . enoxaparin (LOVENOX) injection  40 mg Subcutaneous Q24H  . feeding supplement  1 Container Oral BID BM  . folic acid-pyridoxine-cyancobalamin  1 tablet Oral Daily  . levothyroxine  25 mcg Oral QAC breakfast  . magnesium oxide  200 mg Oral Daily  . multivitamin  1 tablet Oral Daily  . pantoprazole sodium  40 mg Per Tube Daily  . polyethylene glycol  17 g Oral Daily  . potassium chloride  10 mEq Oral TID   Continuous Infusions:   Time spent: 35 minutes with > 50% of time discussing current diagnostic test results, clinical impression and plan of  care with family.    LOS: 6 days   [facility] Pager [phone]. If unable to reach me by pager, please call my cell phone at [phone].  *Please refer to [email or web address], password [email or web address] to get updated schedule on who will round on this patient, as hospitalists switch teams weekly. If 7PM-7AM, please contact night-coverage at [email or web address], password [email or web address] for any overnight needs.  09/14/2015, 8:20 AM

## 2015-09-14 NOTE — Progress Notes (Signed)
STROKE TEAM PROGRESS NOTE   HISTORY OF PRESENT ILLNESS (per record) Teresa Franklin is an 82 y.o. female with a history of hypertension, hyperlipidemia, subdural hematoma, coronary artery disease with myocardial infarction and hypothyroidism, who was admitted on 09/08/2015 for NSTEMI. Patient reportedly had an unwitnessed fall on 09/13/2015 and complained of headache. There's been some intermittent confusion as well. CT scan of her head was obtained which showed an acute subacute medial temporal ischemic infarction on the right. Patient has not experienced focal weakness no numbness. No facial droop is been noted. She's been hoarse since extubation and speech has been unchanged. She has no previous history of stroke. She is currently on aspirin and Plavix.  LSN: Unclear tPA Given: No: No clear objective deficits mRankin:   SUBJECTIVE (INTERVAL HISTORY) There's no family at the bedside. She has a daughter who lives out of state. Patient today says she is "tired". She says that she was feeling very confused however that's improving. She denied any focal weakness, aphasia, dysarthria or sensory changes. Patient fell last evening and hit the right side of her head.   OBJECTIVE Temp:  [97.8 F (36.6 C)-100.9 F (38.3 C)] 98.5 F (36.9 C) (06/24 0653) Pulse Rate:  [82-98] 84 (06/24 0653) Cardiac Rhythm:  [-] Normal sinus rhythm (06/24 0715) Resp:  [18-20] 20 (06/24 0653) BP: (149-174)/(70-96) 156/80 mmHg (06/24 0653) SpO2:  [94 %-98 %] 95 % (06/24 0653)  CBC:  Recent Labs Lab 09/10/15 0357 09/13/15 1910  WBC 7.8 7.8  NEUTROABS 6.3  --   HGB 9.0* 9.2*  HCT 27.7* 28.7*  MCV 93.0 93.8  PLT 108* 149*    Basic Metabolic Panel:  Recent Labs Lab 09/11/15 0440 09/12/15 0337 09/13/15 0342  NA 139 140 138  K 3.7 3.5 3.4*  CL 109 105 105  CO2 22 25 25   GLUCOSE 102* 132* 115*  BUN 28* 29* 17  CREATININE 0.99 1.06* 0.88  CALCIUM 8.2* 8.6* 8.7*  MG 2.2 2.0  --   PHOS 2.8 2.4* 2.8     Lipid Panel:    Component Value Date/Time   CHOL 114 09/09/2015 0350   TRIG 111 09/09/2015 0350   HDL 38* 09/09/2015 0350   CHOLHDL 3.0 09/09/2015 0350   VLDL 22 09/09/2015 0350   LDLCALC 54 09/09/2015 0350   HgbA1c:  Lab Results  Component Value Date   HGBA1C 5.4 09/06/2015   Urine Drug Screen: No results found for: LABOPIA, COCAINSCRNUR, LABBENZ, AMPHETMU, THCU, LABBARB    IMAGING  Ct Head Wo Contrast 09/13/2015   1. Evolving acute or subacute infarct at the medial right temporal lobe. Tiny focus of increased attenuation along the posterior aspect of the infarct is similar in appearance to prior studies and likely reflects calcification, without evidence of hemorrhage transformation.  2. Mild cortical volume loss and scattered small vessel ischemic microangiopathy.     MRI Brain / MRA Head - pending    Dg Chest Port 1 View 09/13/2015   1. Stable mild cardiomegaly and decreased mild pulmonary edema, suggesting improving but persistent mild congestive heart failure.  2. Stable small bilateral pleural effusions and patchy bibasilar lung opacities favoring atelectasis.    2-D echocardiogram 09/09/2015 Study Conclusions - Left ventricle: The cavity size was normal. There was mild  concentric hypertrophy of the septum. Systolic function was normal.    The estimated ejection fraction was in the range of 55% to 60%.    Wall motion was normal; there were no regional wall  motion abnormalities. - Aortic valve: Transvalvular velocity was within the normal range.  There was no stenosis. There was no regurgitation. - Mitral valve: There was moderate regurgitation. Effective  regurgitant orifice (PISA): 0.26 cm^2. Regurgitant volume (PISA):  39 ml. - Left atrium: The atrium was mildly dilated. - Right ventricle: The cavity size was normal. Wall thickness was  normal. Systolic function was normal. - Right atrium: The atrium was mildly dilated. - Tricuspid valve:  There was moderate regurgitation. - Pulmonary arteries: Systolic pressure was moderately to severely  increased. PA peak pressure: 58 mm Hg (S). - Inferior vena cava: The vessel was dilated. - Pericardium, extracardiac: There was a left pleural effusion.    PHYSICAL EXAM Physical exam: Exam: Gen: NAD Eyes: anicteric sclerae, moist conjunctivae                    CV:  RRR, no edema Mental Status: Alert, follows commands  Neuro: Detailed Neurologic Exam  Speech:    No aphasia, no dysarthria  Cranial Nerves:    The pupils are equal, round, and reactive to light.. Attempted, Fundi not visualized.  EOMI. visual fields are intact.  Visual fields full. Face symmetric, Tongue midline. Face sensation intact. Hearing intact to voice. Shoulder shrug intact.  Motor Observation:    no involuntary movements noted. Tone appears normal.     Strength:   No apparent deficits, patient can left all extremities symmetrically antigravity, no drift.     Sensation:  Intact to LT, no sensory changes.  Plantars downgoing. DTRs are hyporeflexic but symmetric throughout.  Cerebellar: Normal finger-nose to nose testing, no dysmetria  ASSESSMENT/PLAN Ms. Teresa Franklin is a 82 y.o. female with history of hypertension, obstructive sleep apnea, hypothyroidism, hepatic artery stenosis, subdural hemorrhage, hyperlipidemia, coronary artery disease with previous MI, and anxiety  presenting with a NSTEMI, recent fall, headache, and intermittent confusion.  She did not receive IV t-PA due to unknown time of onset and minimal deficits.  Stroke:  Non-dominant infarct possibly embolic from an unknown source.  Resultant  no focal neurologic deficit.  MRI - pending  MRA - pending  Head CT - Evolving acute or subacute infarct at the medial right temporal lobe. Patient's neurologic exam is nonfocal, we'll await MRI of the brain results.  Carotid Doppler -  will order  2D Echo - no cardiac source of emboli  identified.  LDL - 54  HgbA1c - will order  VTE prophylaxis - Lovenox  Diet Heart Room service appropriate?: Yes; Fluid consistency:: Thin  No antithrombotic prior to admission, now on aspirin 81 mg and Plavix 75 mg daily  Patient counseled to be compliant with her antithrombotic medications  Ongoing aggressive stroke risk factor management  Therapy recommendations:  Pending  Disposition: Pending   Hypertension  Stable  Permissive hypertension (OK if < 220/120) but gradually normalize in 5-7 days  Long-term BP goal normotensive   Hyperlipidemia  Home meds:  Pravachol 40 mg daily prior to admission not resumed in hospital  LDL 54, goal < 70  Now on Lipitor 80 mg daily as per cardiology.   Continue statin at discharge    Other Stroke Risk Factors  Advanced age  Family hx stroke (mother)  Coronary artery disease  Obstructive sleep apnea    Other Active Problems  Anemia - hemoglobin 9.2 ; hematocrit 28.7  Mild thrombocytopenia  Mild hypokalemia - 3.4  DNR    Hospital day # 6   Personally examined patient and images,  and have participated in and made any corrections needed to history, physical, neuro exam,assessment and plan as stated above.  I have personally obtained the history, evaluated lab date, reviewed imaging studies and agree with radiology interpretations. Patient's neurologic exam is nonfocal, we will await MRI results to confirm the potential stroke seen on CAT scan.   Sarina Ill, MD Stroke Neurology [phone] [facility]       To contact Stroke Continuity provider, please refer to [email or web address] After hours, contact General Neurology

## 2015-09-14 NOTE — Progress Notes (Signed)
RN entered incorrect wasted amount in Pyxis, dual sign-off with second RN, pt given 0.5mg  Ativan, actual amount wasted was 1.5mg  Ativan. Pharmacy notified.   Izola Price, RN 09/14/2015

## 2015-09-14 NOTE — Significant Event (Signed)
Cardiology Cross Cover  Teresa Franklin previously this admission required transvenous pacing via right femoral venous line.  Given upcoming MRI, radiology requesting confirmation that line has been removed.  I examined patient and can confirm no indwelling temporary pacing wires (R fem venous line removed).   Zorita Pang, MD

## 2015-09-14 NOTE — Progress Notes (Signed)
Patient ID: Teresa Franklin, female   DOB: 02/13/1931, 82 y.o.   MRN: [id number]    Patient Profile: 82 y/o female with h/o subarachnoid hemorrhage in March 2015 setting of a fall, also HTN and HLD  admitted for NSTEMI and CHB/ cardiogenic shock.   Subjective: Events noted. She apparently fell 6/23 In talking to her unwitnessed hit right side of head and right hip CT with right temporal stroke.  She has no deficits Voice been hoarse since intubation. Neuro to consult    Objective: Vital signs in last 24 hours: Temp:  [97.8 F (36.6 C)-100.9 F (38.3 C)] 98.5 F (36.9 C) (06/24 0653) Pulse Rate:  [82-98] 84 (06/24 0653) Resp:  [18-20] 20 (06/24 0653) BP: (149-174)/(70-96) 156/80 mmHg (06/24 0653) SpO2:  [94 %-98 %] 95 % (06/24 0653) Last BM Date: 09/13/15  Intake/Output from previous day: 06/23 0701 - 06/24 0700 In: 180 [P.O.:180] Out: 401 [Urine:400; Stool:1] Intake/Output this shift:    Medications Current Facility-Administered Medications  Medication Dose Route Frequency Provider Last Rate Last Dose  . acetaminophen (TYLENOL) tablet 650 mg  650 mg Oral Q4H PRN Lorretta Harp, MD   650 mg at 09/12/15 0324  . albuterol (PROVENTIL) (5 MG/ML) 0.5% nebulizer solution 2.5 mg  2.5 mg Nebulization Q6H PRN Almyra Deforest, PA      . antiseptic oral rinse (CPC / CETYLPYRIDINIUM CHLORIDE 0.05%) solution 7 mL  7 mL Mouth Rinse BID Leonie Man, MD   7 mL at 09/13/15 1018  . aspirin chewable tablet 81 mg  81 mg Oral Daily Rhonda G Barrett, PA-C   81 mg at 09/13/15 1016  . atorvastatin (LIPITOR) tablet 80 mg  80 mg Oral q1800 Rhonda G Barrett, PA-C   80 mg at 09/13/15 1735  . clopidogrel (PLAVIX) tablet 75 mg  75 mg Oral Q breakfast Evelene Croon Barrett, PA-C   75 mg at 09/14/15 0825  . enoxaparin (LOVENOX) injection 40 mg  40 mg Subcutaneous Q24H Rhonda G Barrett, PA-C   40 mg at 09/13/15 2346  . feeding supplement (BOOST / RESOURCE BREEZE) liquid 1 Container  1 Container Oral BID BM Raylene Miyamoto, MD   1 Container at 09/13/15 1014  . folic acid-pyridoxine-cyancobalamin (FOLTX) 2.5-25-2 MG per tablet 1 tablet  1 tablet Oral Daily Rhonda G Barrett, PA-C   1 tablet at 09/13/15 1017  . guaiFENesin (ROBITUSSIN) 100 MG/5ML solution 100 mg  5 mL Oral Q4H PRN Jerline Pain, MD   100 mg at 09/13/15 1028  . hydrALAZINE (APRESOLINE) injection 10 mg  10 mg Intravenous Q6H PRN Dianne Dun, NP      . levothyroxine (SYNTHROID, LEVOTHROID) tablet 25 mcg  25 mcg Oral QAC breakfast Evelene Croon Barrett, PA-C   25 mcg at 09/14/15 0646  . LORazepam (ATIVAN) injection 0.5 mg  0.5 mg Intravenous BID PRN Raylene Miyamoto, MD   0.5 mg at 09/13/15 2346  . magnesium oxide (MAG-OX) tablet 200 mg  200 mg Oral Daily Rhonda G Barrett, PA-C   200 mg at 09/13/15 1016  . metoCLOPramide (REGLAN) tablet 10 mg  10 mg Oral Q6H PRN Eileen Stanford, PA-C   10 mg at 09/13/15 1149  . multivitamin (PROSIGHT) tablet 1 tablet  1 tablet Oral Daily Isaiah Serge, NP   1 tablet at 09/13/15 1018  . nitroGLYCERIN (NITROSTAT) SL tablet 0.4 mg  0.4 mg Sublingual Q5 Min x 3 PRN Rhonda G Barrett, PA-C      .  ondansetron (ZOFRAN) injection 4 mg  4 mg Intravenous Q6H PRN Evelene Croon Barrett, PA-C   4 mg at 09/12/15 1413  . pantoprazole sodium (PROTONIX) 40 mg/20 mL oral suspension 40 mg  40 mg Per Tube Daily Rhonda G Barrett, PA-C   40 mg at 09/13/15 1016  . polyethylene glycol (MIRALAX / GLYCOLAX) packet 17 g  17 g Oral Daily Raylene Miyamoto, MD   17 g at 09/13/15 1016  . promethazine (PHENERGAN) tablet 25 mg  25 mg Oral Q6H PRN Eileen Stanford, PA-C      . zolpidem (AMBIEN) tablet 5 mg  5 mg Oral QHS PRN Lonn Georgia, PA-C   5 mg at 09/12/15 0057    PE: Affect appropriate Frail elderly white female  HEENT: voice hoarse  Neck supple with no adenopathy JVP normal no bruits no thyromegaly Lungs clear with no wheezing and good diaphragmatic motion Heart:  S1/S2 SEM murmur, no rub, gallop or click PMI  normal Abdomen: benighn, BS positve, no tenderness, no AAA no bruit.  No HSM or HJR Distal pulses intact with no bruits No edema Neuro non-focal Skin warm and dry bruising right groin from cath not fall  No muscular weakness   Lab Results:   Recent Labs  09/13/15 1910  WBC 7.8  HGB 9.2*  HCT 28.7*  PLT 149*   BMET  Recent Labs  09/12/15 0337 09/13/15 0342  NA 140 138  K 3.5 3.4*  CL 105 105  CO2 25 25  GLUCOSE 132* 115*  BUN 29* 17  CREATININE 1.06* 0.88  CALCIUM 8.6* 8.7*     Assessment/Plan  Principal Problem:   NSTEMI (non-ST elevated myocardial infarction) (HCC) Active Problems:   Hyperlipidemia with target LDL less than 70   Complete heart block (HCC)   Heart block AV second degree   Essential hypertension   Cardiogenic shock (HCC)   Acute respiratory failure with hypoxia (HCC)   Acute respiratory failure ([place])   Encounter for intubation   Hypophosphatemia   Hypokalemia   1. NSTEMI - Acute marginal RCA branch successfully stented, Dr. Ellyn Hack - 75% residual proximal LAD lesion-discussed with Dr. Ellyn Hack. Discussed as well with Dr. Angelena Form and Carlene Coria. At this time, will forgo PCI to LAD (distal vessel fairly small and if symptoms develop as outpatient, consider rotational atherectomy of proximal LAD. However, this would jail some significant diagonal branches as well. Will NOT pursue further PCI. - She is CP Free  - Dual antiplatelet therapy - Findings on ECG are suggestive of acute injury inferior, also Q waves inferiorly - Troponin 15 - Repeat echocardiogram normal EF (55-60%), reassuring. Previously normal EF. Hemoglobin mild decrease.  - AVOIDING Coreg because of bradycardia.  - Continue ASA, Plavix and Statin.    2. Cardiogenic Shock: - Low-dose dopamine off. Resolved.  - Urine output 1.5 L yesterday, now net negative 1.1L.  - ECHO EF normal, EF 55-60%. Reassuring.   3.  Complete Heart Block/Second Degree AV Block  [place]itz 1/ 2:1 AVB as well: - required TMP  No plans to do again telemetry with NSR no AV block now .  4. Acute Respiratory Failure: - Likely cardiogenic in origin from elevated left ventricular end-diastolic pressure, in setting ischemia.  - Lasix 40 BID again today. Hopeful reduction tomorrow. Breathing improved. Extubated 6/20.  - Appreciate critical care medicine discussed with CCM team.  - Chest x-ray 09/11/15 shows continued continued pulmonary edema.  - Discussed with Jan, [place].  Family in agreement.   5. Neuro:  Fall with head trauma CT with right temporal stroke no focal deficits Given recent stent to RCA complicated by heart block and cardiogenic shock Cannot stop ASA/Plavix Neuro to see   6. Code Status: DNR  Jenkins [place]

## 2015-09-14 NOTE — Plan of Care (Signed)
Cardiology [place]  I was called by radiology regarding an evolving acute or subacute CVA in the medial right temporal lobe identified on head CT this evening.  This had been ordered in the setting of complaints of a headache as the pt stated that she had fallen & hit her head.   Per my examination, she had no focal neurologic deficits & was AOx3.  Per the nurse, there was notation of intermittent confusion throughout the time of the pt's hospitalization, but this was not specifically observed this evening.  She is already taking ASA, Clopidogrel, & Atorvastatin.  I communicated with the neurology team who will further evaluate the pt.    Frann Rider, MD

## 2015-09-14 NOTE — Consult Note (Signed)
Admission H&P    Chief Complaint: Headache following an unwitnessed fall.  HPI: Teresa Franklin is an 82 y.o. female with a history of hypertension, hyperlipidemia, subdural hematoma, coronary artery disease with myocardial infarction and hypothyroidism, who was admitted on 09/08/2015 for NSTEMI. Patient reportedly had an unwitnessed fall on 09/13/2015 and complained of headache. There's been some intermittent confusion as well. CT scan of her head was obtained which showed an acute subacute medial temporal ischemic infarction on the right. Patient has not experienced focal weakness no numbness. No facial droop is been noted. She's been hoarse since extubation and speech has been unchanged. She has no previous history of stroke. She is currently on aspirin and Plavix.  LSN: Unclear tPA Given: No: No clear objective deficits mRankin:  Past Medical History  Diagnosis Date  . Hypertension   . Diverticulitis   . Osteopenia   . Hypothyroidism   . Hepatic artery stenosis ([place])   . Sleep apnea   . Subdural hemorrhage ([place])   . Weakness   . Generalized anxiety disorder   . Back pain   . HLD (hyperlipidemia)   . Hepatic steatosis   . OA (osteoarthritis)   . Coronary artery disease   . Myocardial infarction ([place])   . Anxiety     Past Surgical History  Procedure Laterality Date  . Cardiac catheterization N/A 09/08/2015    Procedure: Temporary Pacemaker;  Surgeon: Leonie Man, MD;  Location: [facility];  Service: Cardiovascular;  Laterality: N/A;  . Cardiac catheterization N/A 09/08/2015    Procedure: Left Heart Cath and Coronary Angiography;  Surgeon: Leonie Man, MD;  Location: [facility];  Service: Cardiovascular;  Laterality: N/A;  . Cardiac catheterization N/A 09/08/2015    Procedure: Coronary Stent Intervention;  Surgeon: Leonie Man, MD;  Location: [facility];  Service: Cardiovascular;  Laterality: N/A;  . Cardiac catheterization  09/08/2015   Procedure: Central Line Insertion;  Surgeon: Leonie Man, MD;  Location: [facility];  Service: Cardiovascular;;  . Cardiac catheterization  09/08/2015    Procedure: Arterial Line Insertion;  Surgeon: Leonie Man, MD;  Location: [facility];  Service: Cardiovascular;;  . Cardiac catheterization N/A 09/09/2015    Procedure: Temporary Wire;  Surgeon: Lorretta Harp, MD;  Location: [facility];  Service: Cardiovascular;  Laterality: N/A;    Family History  Problem Relation Age of Onset  . Hypertension Mother   . Hyperlipidemia Mother   . Stroke Mother   . Diabetes Sister   . Hypertension Sister   . Hyperlipidemia Sister   . Bipolar disorder Sister   . Hyperlipidemia Sister   . Stroke Sister   . Prostate cancer Brother    Social History:  reports that she has never smoked. She does not have any smokeless tobacco history on file. She reports that she does not drink alcohol or use illicit drugs.  Allergies:  Allergies  Allergen Reactions  . Codeine Nausea And Vomiting  . Flomax [Tamsulosin] Other (See Comments)    Pt states that this medication gave her a kidney infection.    Lebron Quam [Hydrocodone-Acetaminophen] Nausea And Vomiting  . Sulfa Antibiotics Nausea And Vomiting    Medications Prior to Admission  Medication Sig Dispense Refill  . acetaminophen (TYLENOL) 325 MG tablet Take 650 mg by mouth every 6 (six) hours as needed for mild pain, fever or headache.     . calcium-vitamin D (OSCAL WITH D)  500-200 MG-UNIT tablet Take 1 tablet by mouth 2 (two) times daily.    . carvedilol (COREG) 12.5 MG tablet Take 12.5 mg by mouth 2 (two) times daily.    . FOLBIC 2.5-25-2 MG TABS tablet Take 1 tablet by mouth daily.    Marland Kitchen levothyroxine (SYNTHROID, LEVOTHROID) 25 MCG tablet Take 25 mcg by mouth daily before breakfast.     . lisinopril (PRINIVIL,ZESTRIL) 40 MG tablet Take 40 mg by mouth daily.    Marland Kitchen LORazepam (ATIVAN) 0.5 MG tablet Take 0.5 mg by mouth 2 (two) times  daily as needed for anxiety.    . Magnesium 250 MG TABS Take 250 mg by mouth daily.    . meloxicam (MOBIC) 7.5 MG tablet Take 7.5 mg by mouth daily as needed for pain.    Marland Kitchen metoCLOPramide (REGLAN) 5 MG tablet Take 1 tablet (5 mg total) by mouth every 8 (eight) hours as needed for nausea. 15 tablet 0  . Multiple Vitamins-Minerals (MULTIVITAMIN GUMMIES ADULT) CHEW Chew 2 each by mouth at bedtime.    . Multiple Vitamins-Minerals (PRESERVISION AREDS 2) CAPS Take 1 capsule by mouth daily.    Marland Kitchen omeprazole (PRILOSEC) 20 MG capsule Take 20 mg by mouth daily before breakfast.     . pravastatin (PRAVACHOL) 40 MG tablet Take 40 mg by mouth daily.     . promethazine (PHENERGAN) 25 MG suppository Place 1 suppository (25 mg total) rectally every 6 (six) hours as needed for nausea. 12 suppository 0  . simethicone (GAS-X) 80 MG chewable tablet Chew 1 tablet (80 mg total) by mouth 4 (four) times daily as needed for flatulence. 20 tablet 0    ROS: History obtained from chart review and the patient.  General ROS: negative for - chills, fatigue, fever, night sweats, weight gain or weight loss Psychological ROS: negative for - behavioral disorder, hallucinations, memory difficulties, mood swings or suicidal ideation Ophthalmic ROS: negative for - blurry vision, double vision, eye pain or loss of vision ENT ROS: negative for - epistaxis, nasal discharge, oral lesions, sore throat, tinnitus or vertigo Allergy and Immunology ROS: negative for - hives or itchy/watery eyes Hematological and Lymphatic ROS: negative for - bleeding problems, bruising or swollen lymph nodes Endocrine ROS: negative for - galactorrhea, hair pattern changes, polydipsia/polyuria or temperature intolerance Respiratory ROS: negative for - cough, hemoptysis, shortness of breath or wheezing Cardiovascular ROS: As noted in present illness Gastrointestinal ROS: negative for - abdominal pain, diarrhea, hematemesis, nausea/vomiting or stool  incontinence Genito-Urinary ROS: negative for - dysuria, hematuria, incontinence or urinary frequency/urgency Musculoskeletal ROS: Lower back and left lower extremity pain Neurological ROS: as noted in HPI Dermatological ROS: negative for rash and skin lesion changes  Physical Examination: Blood pressure 149/70, pulse 82, temperature 98.2 F (36.8 C), temperature source Oral, resp. rate 18, height '5\' 4"'  (1.626 m), weight 62.3 kg (137 lb 5.6 oz), SpO2 96 %.  HEENT-  Normocephalic, no lesions, without obvious abnormality.  Normal external eye and conjunctiva.  Normal TM's bilaterally.  Normal auditory canals and external ears. Normal external nose, mucus membranes and septum.  Normal pharynx. Neck supple with no masses, nodes, nodules or enlargement. Cardiovascular - regular rate and rhythm, S1, S2 normal, no murmur, click, rub or gallop Lungs - chest clear, no wheezing, rales, normal symmetric air entry Abdomen - soft, non-tender; bowel sounds normal; no masses,  no organomegaly Extremities - no joint deformities, effusion, or inflammation and no edema  Neurologic Examination: Mental Status: Alert, oriented, no acute distress.  Speech is hoarse without evidence of aphasia. Able to follow commands without difficulty. Cranial Nerves: II-Visual fields were normal. III/IV/VI-Pupils were equal and reacted normally to light. Extraocular movements were full and conjugate.    V/VII-no facial numbness and no facial weakness. VIII-normal. X-moderate hoarseness without significant dysarthria; symmetrical palatal movement. XI: trapezius strength/neck flexion strength normal bilaterally XII-midline tongue extension with normal strength. Motor: 5/5 bilaterally with normal tone and bulk Sensory: Normal throughout. Deep Tendon Reflexes: 1+ and symmetric. Plantars: Flexor bilaterally Cerebellar: Normal finger-to-nose testing. Carotid auscultation: Normal  Results for orders placed or performed  during the hospital encounter of 09/08/15 (from the past 48 hour(s))  Basic metabolic panel     Status: Abnormal   Collection Time: 09/12/15  3:37 AM  Result Value Ref Range   Sodium 140 135 - 145 mmol/L   Potassium 3.5 3.5 - 5.1 mmol/L   Chloride 105 101 - 111 mmol/L   CO2 25 22 - 32 mmol/L   Glucose, Bld 132 (H) 65 - 99 mg/dL   BUN 29 (H) 6 - 20 mg/dL   Creatinine, Ser 1.06 (H) 0.44 - 1.00 mg/dL   Calcium 8.6 (L) 8.9 - 10.3 mg/dL   GFR calc non Af Amer 48 (L) >60 mL/min   GFR calc Af Amer 55 (L) >60 mL/min    Comment: (NOTE) The eGFR has been calculated using the CKD EPI equation. This calculation has not been validated in all clinical situations. eGFR's persistently <60 mL/min signify possible Chronic Kidney Disease.    Anion gap 10 5 - 15  Magnesium     Status: None   Collection Time: 09/12/15  3:37 AM  Result Value Ref Range   Magnesium 2.0 1.7 - 2.4 mg/dL  Phosphorus     Status: Abnormal   Collection Time: 09/12/15  3:37 AM  Result Value Ref Range   Phosphorus 2.4 (L) 2.5 - 4.6 mg/dL  Basic metabolic panel     Status: Abnormal   Collection Time: 09/13/15  3:42 AM  Result Value Ref Range   Sodium 138 135 - 145 mmol/L   Potassium 3.4 (L) 3.5 - 5.1 mmol/L   Chloride 105 101 - 111 mmol/L   CO2 25 22 - 32 mmol/L   Glucose, Bld 115 (H) 65 - 99 mg/dL   BUN 17 6 - 20 mg/dL   Creatinine, Ser 0.88 0.44 - 1.00 mg/dL   Calcium 8.7 (L) 8.9 - 10.3 mg/dL   GFR calc non Af Amer 60 (L) >60 mL/min   GFR calc Af Amer >60 >60 mL/min    Comment: (NOTE) The eGFR has been calculated using the CKD EPI equation. This calculation has not been validated in all clinical situations. eGFR's persistently <60 mL/min signify possible Chronic Kidney Disease.    Anion gap 8 5 - 15  Phosphorus     Status: None   Collection Time: 09/13/15  3:42 AM  Result Value Ref Range   Phosphorus 2.8 2.5 - 4.6 mg/dL  Troponin I (q 6hr x 3)     Status: Abnormal   Collection Time: 09/13/15 12:40 PM  Result  Value Ref Range   Troponin I 4.29 (HH) <0.031 ng/mL    Comment:        POSSIBLE MYOCARDIAL ISCHEMIA. SERIAL TESTING RECOMMENDED. CRITICAL RESULT CALLED TO, READ BACK BY AND VERIFIED WITH: L.RICHARDSON,RN 09/13/15 1341 BY BSLADE   Blood gas, arterial     Status: Abnormal   Collection Time: 09/13/15  6:35 PM  Result Value Ref  Range   O2 Content 1.0 L/min   Delivery systems NASAL CANNULA    pH, Arterial 7.499 (H) 7.350 - 7.450   pCO2 arterial 33.8 (L) 35.0 - 45.0 mmHg   pO2, Arterial 60.4 (L) 80.0 - 100.0 mmHg   Bicarbonate 26.0 (H) 20.0 - 24.0 mEq/L   TCO2 27.1 0 - 100 mmol/L   Acid-Base Excess 2.9 (H) 0.0 - 2.0 mmol/L   O2 Saturation 91.8 %   Patient temperature 98.6    Collection site RIGHT BRACHIAL    Drawn by [phone]    Sample type ARTERIAL DRAW    Allens test (pass/fail) PASS PASS  CBC     Status: Abnormal   Collection Time: 09/13/15  7:10 PM  Result Value Ref Range   WBC 7.8 4.0 - 10.5 K/uL   RBC 3.06 (L) 3.87 - 5.11 MIL/uL   Hemoglobin 9.2 (L) 12.0 - 15.0 g/dL   HCT 28.7 (L) 36.0 - 46.0 %   MCV 93.8 78.0 - 100.0 fL   MCH 30.1 26.0 - 34.0 pg   MCHC 32.1 30.0 - 36.0 g/dL   RDW 14.6 11.5 - 15.5 %   Platelets 149 (L) 150 - 400 K/uL  Troponin I     Status: Abnormal   Collection Time: 09/13/15  7:10 PM  Result Value Ref Range   Troponin I 4.27 (HH) <0.031 ng/mL    Comment:        POSSIBLE MYOCARDIAL ISCHEMIA. SERIAL TESTING RECOMMENDED. CRITICAL VALUE NOTED.  VALUE IS CONSISTENT WITH PREVIOUSLY REPORTED AND CALLED VALUE.   Troponin I (q 6hr x 3)     Status: Abnormal   Collection Time: 09/14/15 12:14 AM  Result Value Ref Range   Troponin I 3.46 (HH) <0.031 ng/mL    Comment:        POSSIBLE MYOCARDIAL ISCHEMIA. SERIAL TESTING RECOMMENDED. CRITICAL VALUE NOTED.  VALUE IS CONSISTENT WITH PREVIOUSLY REPORTED AND CALLED VALUE.    Ct Head Wo Contrast  09/13/2015  CLINICAL DATA:  Status post fall, with severe headache. Concern for head injury. Initial encounter. EXAM:  CT HEAD WITHOUT CONTRAST TECHNIQUE: Contiguous axial images were obtained from the base of the skull through the vertex without intravenous contrast. COMPARISON:  CT of the head performed 03/26/2013, and MRI of the brain performed 08/31/2013 FINDINGS: There is an evolving acute or subacute infarct at the medial right temporal lobe. A tiny focus of increased attenuation along the posterior aspect of the infarct is similar in appearance to prior studies and likely reflects calcification, without evidence of hemorrhagic transformation. Prominence of the ventricles and sulci suggests mild cortical volume loss. Mild subcortical white matter change likely reflects small vessel ischemic microangiopathy. The brainstem and fourth ventricle are within normal limits. The basal ganglia are unremarkable in appearance. No midline shift is seen. There is no evidence of fracture; visualized osseous structures are unremarkable in appearance. The orbits are within normal limits. The paranasal sinuses and mastoid air cells are well-aerated. No significant soft tissue abnormalities are seen. IMPRESSION: 1. Evolving acute or subacute infarct at the medial right temporal lobe. Tiny focus of increased attenuation along the posterior aspect of the infarct is similar in appearance to prior studies and likely reflects calcification, without evidence of hemorrhage transformation. 2. Mild cortical volume loss and scattered small vessel ischemic microangiopathy. These results were called by telephone at the time of interpretation on 09/13/2015 at 10:54 pm to Dr. Rosanna Randy, who verbally acknowledged these results. Electronically Signed   By: Garald Balding  M.D.   On: 09/13/2015 22:55   Dg Chest Port 1 View  09/13/2015  CLINICAL DATA:  Shortness of breath EXAM: PORTABLE CHEST 1 VIEW COMPARISON:  09/11/2015 chest radiograph. FINDINGS: Slightly low lung volumes. Stable cardiomediastinal silhouette with mild cardiomegaly. No pneumothorax. Stable  small bilateral pleural effusions. Mild pulmonary edema, decreased. Stable patchy bibasilar lung opacities. IMPRESSION: 1. Stable mild cardiomegaly and decreased mild pulmonary edema, suggesting improving but persistent mild congestive heart failure. 2. Stable small bilateral pleural effusions and patchy bibasilar lung opacities favoring atelectasis. Electronically Signed   By: Ilona Sorrel M.D.   On: 09/13/2015 18:53    Assessment: 82 y.o. female with multiple risk factors for stroke with likely acute right medial temporal ischemic infarction.  Stroke Risk Factors - family history, hyperlipidemia and hypertension  Plan: 1. HgbA1c, fasting lipid panel 2. MRI, MRA  of the brain without contrast 3. PT consult, OT consult, Speech consult 4. Carotid dopplers 5. Prophylactic therapy-Antiplatelet med: Aspirin  6. Risk factor modification 7. Telemetry monitoring  C.R. Nicole Kindred, MD [facility] [phone]  09/14/2015, 2:26 AM

## 2015-09-15 ENCOUNTER — Inpatient Hospital Stay (HOSPITAL_COMMUNITY): Payer: Medicare Other

## 2015-09-15 DIAGNOSIS — I63511 Cerebral infarction due to unspecified occlusion or stenosis of right middle cerebral artery: Secondary | ICD-10-CM

## 2015-09-15 LAB — URINALYSIS, ROUTINE W REFLEX MICROSCOPIC
BILIRUBIN URINE: NEGATIVE
Glucose, UA: NEGATIVE mg/dL
KETONES UR: NEGATIVE mg/dL
NITRITE: NEGATIVE
Protein, ur: 100 mg/dL — AB
Specific Gravity, Urine: 1.019 (ref 1.005–1.030)
pH: 8 (ref 5.0–8.0)

## 2015-09-15 LAB — URINE MICROSCOPIC-ADD ON

## 2015-09-15 LAB — CREATININE, SERUM: CREATININE: 0.65 mg/dL (ref 0.44–1.00)

## 2015-09-15 MED ORDER — METHYLPREDNISOLONE SODIUM SUCC 40 MG IJ SOLR
40.0000 mg | Freq: Every day | INTRAMUSCULAR | Status: DC
  Administered 2015-09-15 – 2015-09-18 (×4): 40 mg via INTRAVENOUS
  Filled 2015-09-15 (×4): qty 1

## 2015-09-15 MED ORDER — ONDANSETRON 4 MG PO TBDP
4.0000 mg | ORAL_TABLET | Freq: Three times a day (TID) | ORAL | Status: DC
  Administered 2015-09-15 – 2015-09-18 (×9): 4 mg via ORAL
  Filled 2015-09-15 (×10): qty 1

## 2015-09-15 MED ORDER — POLYVINYL ALCOHOL 1.4 % OP SOLN
1.0000 [drp] | OPHTHALMIC | Status: DC | PRN
  Administered 2015-09-15 – 2015-09-16 (×2): 1 [drp] via OPHTHALMIC
  Filled 2015-09-15: qty 15

## 2015-09-15 NOTE — Progress Notes (Signed)
VASCULAR LAB PRELIMINARY  PRELIMINARY  PRELIMINARY  PRELIMINARY  Carotid duplex completed.    Preliminary report:  1-39% ICA plaquing.  Vertebral artery flow is antegrade.   Aaronjames Kelsay, RVT 09/15/2015, 3:12 PM

## 2015-09-15 NOTE — Progress Notes (Signed)
Patient ID: Teresa Franklin, female   DOB: 26-Apr-1930, 82 y.o.   MRN: [id number]    Patient Profile: 82 y/o female with h/o subarachnoid hemorrhage in March 2015 setting of a fall, also HTN and HLD  admitted for NSTEMI and CHB/ cardiogenic shock.   Subjective: Events noted. She apparently fell 6/23 In talking to her unwitnessed hit right side of head and right hip CT with right temporal stroke.  She has no deficits Voice been hoarse since intubation. Nero seen MRI done    Objective: Vital signs in last 24 hours: Temp:  [98.6 F (37 C)-98.7 F (37.1 C)] 98.7 F (37.1 C) (06/25 0446) Pulse Rate:  [82-98] 89 (06/25 0447) Resp:  [18-20] 20 (06/25 0446) BP: (133-157)/(64-79) 154/65 mmHg (06/25 0447) SpO2:  [96 %-99 %] 96 % (06/25 0446) Last BM Date: 09/14/15  Intake/Output from previous day: 06/24 0701 - 06/25 0700 In: 220 [P.O.:220] Out: 250 [Urine:250] Intake/Output this shift: Total I/O In: 360 [P.O.:360] Out: 100 [Urine:100]  Medications Current Facility-Administered Medications  Medication Dose Route Frequency Provider Last Rate Last Dose  . acetaminophen (TYLENOL) tablet 650 mg  650 mg Oral Q4H PRN Lorretta Harp, MD   650 mg at 09/14/15 1529  . albuterol (PROVENTIL) (5 MG/ML) 0.5% nebulizer solution 2.5 mg  2.5 mg Nebulization Q6H PRN Almyra Deforest, PA   2.5 mg at 09/14/15 2245  . antiseptic oral rinse (CPC / CETYLPYRIDINIUM CHLORIDE 0.05%) solution 7 mL  7 mL Mouth Rinse BID Leonie Man, MD   7 mL at 09/14/15 1000  . aspirin chewable tablet 81 mg  81 mg Oral Daily Evelene Croon Barrett, PA-C   81 mg at 09/14/15 1101  . atorvastatin (LIPITOR) tablet 80 mg  80 mg Oral q1800 Rhonda G Barrett, PA-C   80 mg at 09/14/15 1839  . clopidogrel (PLAVIX) tablet 75 mg  75 mg Oral Q breakfast Evelene Croon Barrett, PA-C   75 mg at 09/15/15 0855  . enoxaparin (LOVENOX) injection 40 mg  40 mg Subcutaneous Q24H Rhonda G Barrett, PA-C   40 mg at 09/14/15 2009  . feeding supplement (BOOST / RESOURCE  BREEZE) liquid 1 Container  1 Container Oral BID BM Raylene Miyamoto, MD   1 Container at 09/14/15 1416  . folic acid-pyridoxine-cyancobalamin (FOLTX) 2.5-25-2 MG per tablet 1 tablet  1 tablet Oral Daily Rhonda G Barrett, PA-C   1 tablet at 09/14/15 1100  . guaiFENesin (ROBITUSSIN) 100 MG/5ML solution 100 mg  5 mL Oral Q4H PRN Jerline Pain, MD   100 mg at 09/15/15 0500  . hydrALAZINE (APRESOLINE) injection 10 mg  10 mg Intravenous Q6H PRN Dianne Dun, NP      . levothyroxine (SYNTHROID, LEVOTHROID) tablet 25 mcg  25 mcg Oral QAC breakfast Evelene Croon Barrett, PA-C   25 mcg at 09/15/15 0855  . LORazepam (ATIVAN) injection 0.5 mg  0.5 mg Intravenous BID PRN Raylene Miyamoto, MD   0.5 mg at 09/14/15 2357  . magnesium oxide (MAG-OX) tablet 200 mg  200 mg Oral Daily Rhonda G Barrett, PA-C   200 mg at 09/14/15 1100  . metoCLOPramide (REGLAN) injection 5 mg  5 mg Intravenous Q6H PRN Venetia Maxon Rama, MD   5 mg at 09/14/15 1615  . multivitamin (PROSIGHT) tablet 1 tablet  1 tablet Oral Daily Isaiah Serge, NP   1 tablet at 09/14/15 1101  . nitroGLYCERIN (NITROSTAT) SL tablet 0.4 mg  0.4 mg Sublingual Q5 Min x 3  PRN Evelene Croon Barrett, PA-C      . ondansetron (ZOFRAN) injection 4 mg  4 mg Intravenous Q6H PRN Evelene Croon Barrett, PA-C   4 mg at 09/12/15 1413  . pantoprazole sodium (PROTONIX) 40 mg/20 mL oral suspension 40 mg  40 mg Per Tube Daily Rhonda G Barrett, PA-C   40 mg at 09/14/15 1059  . polyethylene glycol (MIRALAX / GLYCOLAX) packet 17 g  17 g Oral Daily Raylene Miyamoto, MD   17 g at 09/13/15 1016  . promethazine (PHENERGAN) 6.25 MG/5ML syrup 12.5 mg  12.5 mg Oral Q6H PRN Venetia Maxon Rama, MD      . zolpidem (AMBIEN) tablet 5 mg  5 mg Oral QHS PRN Evelene Croon Barrett, PA-C   5 mg at 09/12/15 0057    PE: Affect appropriate Frail elderly white female  HEENT: voice hoarse  Neck supple with no adenopathy JVP normal no bruits no thyromegaly Lungs clear with no wheezing and good  diaphragmatic motion Heart:  S1/S2 SEM murmur, no rub, gallop or click PMI normal Abdomen: benighn, BS positve, no tenderness, no AAA no bruit.  No HSM or HJR Distal pulses intact with no bruits No edema Neuro non-focal Skin warm and dry bruising right groin from cath not fall  No muscular weakness   Lab Results:   Recent Labs  09/13/15 1910  WBC 7.8  HGB 9.2*  HCT 28.7*  PLT 149*   BMET  Recent Labs  09/13/15 0342 09/15/15 0526  NA 138  --   K 3.4*  --   CL 105  --   CO2 25  --   GLUCOSE 115*  --   BUN 17  --   CREATININE 0.88 0.65  CALCIUM 8.7*  --      Assessment/Plan  Principal Problem:   NSTEMI (non-ST elevated myocardial infarction) (HCC) Active Problems:   Hyperlipidemia with target LDL less than 70   Complete heart block (HCC)   Heart block AV second degree   Essential hypertension   Cardiogenic shock (HCC)   Acute respiratory failure with hypoxia (HCC)   Acute respiratory failure (HCC)   Encounter for intubation   Hypophosphatemia   Hypokalemia   Acute right MCA stroke ([place])   Fall   1. NSTEMI - Acute marginal RCA branch successfully stented, Dr. Ellyn Hack - 75% residual proximal LAD lesion-discussed with Dr. Ellyn Hack. Discussed as well with Dr. Angelena Form and Carlene Coria. At this time, will forgo PCI to LAD (distal vessel fairly small and if symptoms develop as outpatient, consider rotational atherectomy of proximal LAD. However, this would jail some significant diagonal branches as well. Will NOT pursue further PCI. - She is CP Free  - Dual antiplatelet therapy - Findings on ECG are suggestive of acute injury inferior, also Q waves inferiorly - Troponin 15 - Repeat echocardiogram normal EF (55-60%), reassuring. Previously normal EF. Hemoglobin mild decrease.  - AVOIDING Coreg because of bradycardia.  - Continue ASA, Plavix and Statin.    2. Cardiogenic Shock: - Low-dose dopamine off. Resolved.  - Urine output 1.5 L yesterday, now  net negative 1.1L.  - ECHO EF normal, EF 55-60%. Reassuring.   3.  Complete Heart Block/Second Degree AV Block [place] 1/ 2:1 AVB as well: - required TMP  No plans to do again telemetry with NSR no AV block now .  4. Acute Respiratory Failure: - Likely cardiogenic in origin from elevated left ventricular end-diastolic pressure, in setting ischemia.  - Lasix 40 BID again  today. Hopeful reduction tomorrow. Breathing improved. Extubated 6/20.  - Appreciate critical care medicine discussed with CCM team.  - Chest x-ray 09/11/15 shows continued continued pulmonary edema.  - Discussed with Jan, [place]. Family in agreement.   5. Neuro:  Fall with head trauma CT with right temporal stroke no focal deficits Given recent stent to RCA complicated by heart block and cardiogenic shock Cannot stop ASA/Plavix Stroke confirmed by MRI despite no focal deficits.  She likely had watershed infarct during acute MI and cardiogenic shock with prolonged Hypotension in setting of severe right P2 segment stenosis.   6. ENT:  Complains of smothering and has hoarsness and likely vocal chord swelling will give some steroids no stridor on exam but may need ENT consult  7. Code Status: DNR  Discussed transfer of care to Hospitalist with Dr Rama ? Goals of care meeting with palliative care consult [facility]  Jenkins Rouge

## 2015-09-15 NOTE — Progress Notes (Signed)
STROKE TEAM PROGRESS NOTE   HISTORY OF PRESENT ILLNESS (per record) Teresa Franklin is an 82 y.o. female with a history of hypertension, hyperlipidemia, subdural hematoma, coronary artery disease with myocardial infarction and hypothyroidism, who was admitted on 09/08/2015 for NSTEMI. Patient reportedly had an unwitnessed fall on 09/13/2015 and complained of headache. There's been some intermittent confusion as well. CT scan of her head was obtained which showed an acute subacute medial temporal ischemic infarction on the right. Patient has not experienced focal weakness no numbness. No facial droop is been noted. She's been hoarse since extubation and speech has been unchanged. She has no previous history of stroke. She is currently on aspirin and Plavix.  LSN: Unclear tPA Given: No: No clear objective deficits mRankin:   SUBJECTIVE (INTERVAL HISTORY) Patient today reports that she has shortness of breath and nausea. There is no family at bedside today. Discussed that she did have a stroke likely due to stenosis of the blood vessel and I will keep her on dual platelet therapy for 3 months and follow up outpatient with neurology.   OBJECTIVE Temp:  [98.6 F (37 C)-98.7 F (37.1 C)] 98.7 F (37.1 C) (06/25 0446) Pulse Rate:  [82-98] 89 (06/25 0447) Cardiac Rhythm:  [-] Normal sinus rhythm (06/25 0857) Resp:  [18-20] 20 (06/25 0446) BP: (133-157)/(64-79) 154/65 mmHg (06/25 0447) SpO2:  [96 %-99 %] 96 % (06/25 0446)  CBC:   Recent Labs Lab 09/10/15 0357 09/13/15 1910  WBC 7.8 7.8  NEUTROABS 6.3  --   HGB 9.0* 9.2*  HCT 27.7* 28.7*  MCV 93.0 93.8  PLT 108* 149*    Basic Metabolic Panel:   Recent Labs Lab 09/11/15 0440 09/12/15 0337 09/13/15 0342 09/15/15 0526  NA 139 140 138  --   K 3.7 3.5 3.4*  --   CL 109 105 105  --   CO2 22 25 25   --   GLUCOSE 102* 132* 115*  --   BUN 28* 29* 17  --   CREATININE 0.99 1.06* 0.88 0.65  CALCIUM 8.2* 8.6* 8.7*  --   MG 2.2 2.0  --    --   PHOS 2.8 2.4* 2.8  --     Lipid Panel:     Component Value Date/Time   CHOL 114 09/09/2015 0350   TRIG 111 09/09/2015 0350   HDL 38* 09/09/2015 0350   CHOLHDL 3.0 09/09/2015 0350   VLDL 22 09/09/2015 0350   LDLCALC 54 09/09/2015 0350   HgbA1c:  Lab Results  Component Value Date   HGBA1C 5.4 09/06/2015   Urine Drug Screen: No results found for: LABOPIA, COCAINSCRNUR, LABBENZ, AMPHETMU, THCU, LABBARB    IMAGING  Ct Head Wo Contrast 09/13/2015   1. Evolving acute or subacute infarct at the medial right temporal lobe. Tiny focus of increased attenuation along the posterior aspect of the infarct is similar in appearance to prior studies and likely reflects calcification, without evidence of hemorrhage transformation.  2. Mild cortical volume loss and scattered small vessel ischemic microangiopathy.     MRI Brain / MRA Head  09/15/2015  MRI HEAD:  Acute RIGHT posterior cerebral artery territory (RIGHT temporal lobe/ hippocampus and RIGHT occipital lobe) without hemorrhagic transformation. Small RIGHT temporal lobe encephalomalacia may be posttraumatic or ischemic.  Otherwise negative MRI of the head for age.  MRA HEAD:  Fetal origin RIGHT posterior cerebral artery with severe flow-limiting stenosis RIGHT P2 segment. Severe stenosis proximal LEFT M2 segment.    Dg Chest [facility]  1 View 09/13/2015   1. Stable mild cardiomegaly and decreased mild pulmonary edema, suggesting improving but persistent mild congestive heart failure.  2. Stable small bilateral pleural effusions and patchy bibasilar lung opacities favoring atelectasis.    2-D echocardiogram 09/09/2015 Study Conclusions - Left ventricle: The cavity size was normal. There was mild  concentric hypertrophy of the septum. Systolic function was normal.    The estimated ejection fraction was in the range of 55% to 60%.    Wall motion was normal; there were no regional wall  motion abnormalities. - Aortic  valve: Transvalvular velocity was within the normal range.  There was no stenosis. There was no regurgitation. - Mitral valve: There was moderate regurgitation. Effective  regurgitant orifice (PISA): 0.26 cm^2. Regurgitant volume (PISA):  39 ml. - Left atrium: The atrium was mildly dilated. - Right ventricle: The cavity size was normal. Wall thickness was  normal. Systolic function was normal. - Right atrium: The atrium was mildly dilated. - Tricuspid valve: There was moderate regurgitation. - Pulmonary arteries: Systolic pressure was moderately to severely  increased. PA peak pressure: 58 mm Hg (S). - Inferior vena cava: The vessel was dilated. - Pericardium, extracardiac: There was a left pleural effusion.    PHYSICAL EXAM Physical exam: Exam: Gen: NAD Eyes: anicteric sclerae, moist conjunctivae                    CV:  RRR, no edema Mental Status: Alert, follows commands  Neuro: Detailed Neurologic Exam  Speech:    No aphasia, no dysarthria  Cranial Nerves:    The pupils are equal, round, and reactive to light.. Attempted, Fundi not visualized.  EOMI. visual fields are intact.  Visual fields full. Face symmetric, Tongue midline. Face sensation intact. Hearing intact to voice. Shoulder shrug intact.  Motor Observation:    no involuntary movements noted. Tone appears normal.     Strength:   No apparent deficits, patient can left all extremities symmetrically antigravity, no drift.     Sensation:  Intact to LT, no sensory changes.  Plantars downgoing. DTRs are hyporeflexic but symmetric throughout.  Cerebellar: Normal finger-nose to nose testing, no dysmetria  ASSESSMENT/PLAN Ms. Teresa Franklin is a 82 y.o. female with history of hypertension, obstructive sleep apnea, hypothyroidism, hepatic artery stenosis, subdural hemorrhage, hyperlipidemia, coronary artery disease with previous MI, and anxiety  presenting with a NSTEMI, recent fall, headache, and intermittent  confusion.  She did not receive IV t-PA due to unknown time of onset and minimal deficits.   Stroke:  Non-dominant infarct from right P2 segment stenosis and ischemic stroke in the setting of hypotension due to cardiogenic shock.   Resultant  no focal neurologic deficit.  MRI - Acute right posterior cerebral artery territory infarct and severe flow-limiting stenosis RIGHT P2 segment.    MRA - severe flow-limiting stenosis RIGHT P2 segment. Severe stenosis proximal LEFT M2 segment.  Carotid Doppler -  pending  2D Echo - no cardiac source of emboli identified.  LDL - 54  HgbA1c - pending  VTE prophylaxis - Lovenox Diet Heart Room service appropriate?: Yes; Fluid consistency:: Thin  No antithrombotic prior to admission, now on aspirin 81 mg and Plavix 75 mg daily  Patient counseled to be compliant with her antithrombotic medications  Ongoing aggressive stroke risk factor management  Therapy recommendations:  Pending  Disposition: Pending   Hypertension  Stable  Permissive hypertension (OK if < 220/120) but gradually normalize in 5-7 days  Long-term BP goal  normotensive   Hyperlipidemia  Home meds:  Pravachol 40 mg daily prior to admission not resumed in hospital  LDL 54, goal < 70  Now on Lipitor 80 mg daily as per cardiology.   Continue statin at discharge    Other Stroke Risk Factors  Advanced age  Family hx stroke (mother)  Coronary artery disease  Obstructive sleep apnea    Other Active Problems  Anemia - hemoglobin 9.2 ; hematocrit 28.7  Mild thrombocytopenia  Mild hypokalemia - 3.4  DNR    PLAN  Continue dual antiplatelet therapy for severe stenosis right P2 segment. Follow up outpatient with Dr. Erlinda Hong in 2 months.     Hospital day # 7   Personally examined patient and images, and have participated in and made any corrections needed to history, physical, neuro exam,assessment and plan as stated above.  I have personally obtained the  history, evaluated lab date, reviewed imaging studies and agree with radiology interpretations. The patient was continue on dual antiplatelet therapy for severe right P2 stenosis with ischemic infarct and afterwards continue with aspirin for stroke prevention. If carotid Dopplers are unremarkable stroke team will sign off at this time with follow-up outpatient.   Sarina Ill, MD Stroke Neurology [phone] [facility]       To contact Stroke Continuity provider, please refer to [email or web address] After hours, contact General Neurology

## 2015-09-15 NOTE — Progress Notes (Signed)
Progress Note    Teresa Franklin  [id number] DOB: 06/26/1930  DOA: 09/08/2015 PCP: [name], MD    Brief Narrative:   Teresa Franklin is an 82 y.o. female with h/o subarachnoid hemorrhage in March 2015 in the setting of a fall, HTN and HLD Who was admitted 09/08/15 for NSTEMI and CHB/ cardiogenic shock.   Assessment/Plan:   Principal Problem:   NSTEMI (non-ST elevated myocardial infarction) ([place]) Acute marginal RCA branch successfully stented, Dr. Ellyn Hack. 75% residual proximal LAD lesion-no current plans for PCI to LAD (distal vessel fairly small and if symptoms develop as outpatient, consider rotational atherectomy of proximal LAD. Repeat echocardiogram normal EF (55-60%), reassuring. Previously normal EF. No beta blocker secondary to bradycardia. Continue statin and dual antiplatelet therapy.Chest x-ray done 09/13/15 which showed mild pulmonary edema for which the patient received Lasix. After discussing with family, will get a palliative care consult for symptom control and to address goals of care.  Active Problems:   Hypokalemia Replete.    Cough/Hoarseness OK to use Robitussin PRN, but avoid Robitussin DM. ABG, chest x-ray and a CBC were done and she was given Xopenex to address shortness of breath. Chest x-ray showed mild pulmonary edema but decreased in amount.  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 No other focal deficits were noted. MRI/MRA confirmed findings. MRA shows severe flow limiting stenosis of the right P2 cerebral artery. Follow-up carotid Dopplers. 2-D echo negative for cardiac source of emboli. LDL 54, hemoglobin A1c pending. Already on dual antiplatelet therapy.    Nausea Appears to be chronic, but son concerned that these are the symptoms  she had before she had a heart attack.  OK to use Reglan PRN. Cycled troponins every 6 hours 3 overnight and while elevated, the trend is down from her admission values.  Continue as needed Phenergan and reglan. Will add zofran ODT Q 8 hours ATC.    Hyperlipidemia with target LDL less than 70 Continue statin.    Complete heart block (HCC) / heart block AV second-degree Observed in the Cath Lab. Temporary wire was discontinued but then had to be replaced on 09/09/15 because of hypotension associated with 2:1 AVB and HR of 45 bpm despite dopamine 20. Maintaining sinus rhythm. Family indicates that they would not want a permanent pacemaker placed. Avoid beta blockers.    Essential hypertension Managing with Lasix.    Cardiogenic shock ([place]) Needed dopamine to resolve. 2-D echo showed normal EF at 55-60 percent.    Acute respiratory failure with hypoxia (HCC)/Acute respiratory failure (HCC) Likely cardiogenic in origin from elevated left ventricular end-diastolic pressure, in setting ischemia. Successfully extubated 09/10/15. Chest x-ray done 09/11/15 which showed persistent pulmonary edema. Diuresis per cardiology. No plans for reintubation should she decompensate per family.    Hypothyroidism Continue Synthroid.    Hypophosphatemia Repleted.   Family Communication/Anticipated D/C date and plan/Code Status   DVT prophylaxis: Lovenox ordered. Code Status: DNR Family Communication: Son updated at the bedside. Disposition Plan: To be determined.  Palliative care consulted. Likely will need SNF for rehab.   Medical Consultants:    Cardiology  Neurology   Procedures:   2 D Echo 09/09/15  Study Conclusions  - Left ventricle: The cavity size was normal. There was mild  concentric hypertrophy of the septum. Systolic  function was  normal. The estimated ejection fraction was in the range of 55%  to 60%. Wall motion was normal; there were no regional wall  motion  abnormalities. - Aortic valve: Transvalvular velocity was within the normal range.  There was no stenosis. There was no regurgitation. - Mitral valve: There was moderate regurgitation. Effective  regurgitant orifice (PISA): 0.26 cm^2. Regurgitant volume (PISA):  39 ml. - Left atrium: The atrium was mildly dilated. - Right ventricle: The cavity size was normal. Wall thickness was  normal. Systolic function was normal. - Right atrium: The atrium was mildly dilated. - Tricuspid valve: There was moderate regurgitation. - Pulmonary arteries: Systolic pressure was moderately to severely  increased. PA peak pressure: 58 mm Hg (S). - Inferior vena cava: The vessel was dilated. - Pericardium, extracardiac: There was a left pleural effusion.  Anti-Infectives:   Anti-infectives    None      Subjective:   Teresa Franklin has multiple complaints.  Continues to feel weak, hoarse, nauseated, poor appetite, shortness of breath.  Objective:    Filed Vitals:   09/14/15 1410 09/14/15 2031 09/15/15 0446 09/15/15 0447  BP: 133/64 157/79  154/65  Pulse: 82 98 87 89  Temp: 98.6 F (37 C) 98.7 F (37.1 C) 98.7 F (37.1 C)   TempSrc: Oral Oral Oral   Resp: 18 18 20    Height:      Weight:      SpO2: 99% 97% 96%     Intake/Output Summary (Last 24 hours) at 09/15/15 0829 Last data filed at 09/15/15 0827  Gross per 24 hour  Intake    220 ml  Output    350 ml  Net   -130 ml   Filed Weights   09/09/15 0500 09/10/15 0447 09/11/15 0500  Weight: 63 kg (138 lb 14.2 oz) 62.4 kg (137 lb 9.1 oz) 62.3 kg (137 lb 5.6 oz)    Exam: General exam: Weak appearing.  Hoarse.  Respiratory system: Clear to auscultation. Respiratory effort normal. Cardiovascular system: S1 & S2 heard, RRR. No JVD,  rubs, gallops or clicks. No murmurs. Gastrointestinal system: Abdomen is nondistended, soft and nontender. No organomegaly or masses felt. Normal bowel sounds heard. Central nervous system: Sleepy, oriented  to self and place. No focal neurological deficits. Extremities: No clubbing, edema, or cyanosis. Skin: No rashes, lesions or ulcers Psychiatry: Judgement and insight appear normal. Mood & affect anxious.   Data Reviewed:   I have personally reviewed following labs and imaging studies:  Labs: Basic Metabolic Panel:  Recent Labs Lab 09/09/15 0350  09/09/15 2120 09/10/15 0357 09/10/15 2145 09/11/15 0440 09/12/15 0337 09/13/15 0342 09/15/15 0526  NA 136  --   --  136  --  139 140 138  --   K 3.2*  --   --  4.0  --  3.7 3.5 3.4*  --   CL 108  --   --  110  --  109 105 105  --   CO2 21*  --   --  21*  --  22 25 25   --   GLUCOSE 151*  --   --  160*  --  102* 132* 115*  --   BUN 13  --   --  25*  --  28* 29* 17  --   CREATININE 0.89  --   --  1.02*  --  0.99 1.06* 0.88 0.65  CALCIUM 8.4*  --   --  8.3*  --  8.2* 8.6* 8.7*  --   MG 1.5*  < > 3.3* 2.7* 2.1 2.2 2.0  --   --   PHOS 2.2*  < > 3.0 2.4* 2.1* 2.8 2.4* 2.8  --   < > = values in this interval not displayed. GFR Estimated Creatinine Clearance: 46.8 mL/min (by C-G formula based on Cr of 0.65). Liver Function Tests:  Recent Labs Lab 09/09/15 0350 09/10/15 0357  AST 80* 46*  ALT 46 31  ALKPHOS 79 71  BILITOT 1.0 0.9  PROT 5.3* 5.0*  ALBUMIN 2.6* 2.2*   CBC:  Recent Labs Lab 09/08/15 1159 09/08/15 1415 09/09/15 0350 09/10/15 0357 09/13/15 1910  WBC 9.7  --  11.5* 7.8 7.8  NEUTROABS  --   --   --  6.3  --   HGB 9.8* 10.2* 10.2* 9.0* 9.2*  HCT 28.9* 30.0* 29.7* 27.7* 28.7*  MCV 91.4  --  89.2 93.0 93.8  PLT 76*  --  116* 108* 149*   Cardiac Enzymes:  Recent Labs Lab 09/13/15 1240 09/13/15 1910 09/14/15 0014  TROPONINI 4.29* 4.27* 3.46*   CBG:  Recent Labs Lab 09/10/15 1542 09/10/15 2139 09/11/15 0052 09/11/15 0444 09/11/15 0725  GLUCAP 112* 122* 113* 112* 85   SMicrobiology Recent Results (from the past 240 hour(s))  Surgical pcr screen     Status: None   Collection Time: 09/08/15 12:16  PM  Result Value Ref Range Status   MRSA, PCR NEGATIVE NEGATIVE Final   Staphylococcus aureus NEGATIVE NEGATIVE Final    Comment:        The Xpert SA Assay (FDA approved for NASAL specimens in patients over 50 years of age), is one component of a comprehensive surveillance program.  Test performance has been validated by [facility] for patients greater than or equal to 18 year old. It is not intended to diagnose infection nor to guide or monitor treatment.   MRSA PCR Screening     Status: None   Collection Time: 09/08/15  5:03 PM  Result Value Ref Range Status   MRSA by PCR NEGATIVE NEGATIVE Final    Comment:        The GeneXpert MRSA Assay (FDA approved for NASAL specimens only), is one component of a comprehensive MRSA colonization surveillance program. It is not intended to diagnose MRSA infection nor to guide or monitor treatment for MRSA infections.     Radiology: Ct Head Wo Contrast  09/13/2015  CLINICAL DATA:  Status post fall, with severe headache. Concern for head injury. Initial encounter. EXAM: CT HEAD WITHOUT CONTRAST TECHNIQUE: Contiguous axial images were obtained from the base of the skull through the vertex without intravenous contrast. COMPARISON:  CT of the head performed 03/26/2013, and MRI of the brain performed 08/31/2013 FINDINGS: There is an evolving acute or subacute infarct at the medial right temporal lobe. A tiny focus of increased attenuation along the posterior aspect of the infarct is similar in appearance to prior studies and likely reflects calcification, without evidence of hemorrhagic transformation. Prominence of the ventricles and sulci suggests mild cortical volume loss. Mild subcortical white matter change likely reflects small vessel ischemic microangiopathy. The brainstem and fourth ventricle are within normal limits. The basal ganglia are unremarkable in appearance. No midline shift is seen. There is no evidence of fracture; visualized  osseous structures are unremarkable in appearance. The orbits are within normal limits. The paranasal sinuses and mastoid air cells are well-aerated. No significant soft tissue abnormalities are seen. IMPRESSION: 1. Evolving  acute or subacute infarct at the medial right temporal lobe. Tiny focus of increased attenuation along the posterior aspect of the infarct is similar in appearance to prior studies and likely reflects calcification, without evidence of hemorrhage transformation. 2. Mild cortical volume loss and scattered small vessel ischemic microangiopathy. These results were called by telephone at the time of interpretation on 09/13/2015 at 10:54 pm to Dr. Rosanna Randy, who verbally acknowledged these results. Electronically Signed   By: Garald Balding M.D.   On: 09/13/2015 22:55   Mr Jodene Nam Head Wo Contrast  09/15/2015  CLINICAL DATA:  Unwitnessed fall September 13, 2015, subsequent headache and intermittent confusion. Follow-up RIGHT temporal lobe infarct. EXAM: MRI HEAD WITHOUT CONTRAST MRA HEAD WITHOUT CONTRAST TECHNIQUE: Multiplanar, multiecho pulse sequences of the brain and surrounding structures were obtained without intravenous contrast. Angiographic images of the head were obtained using MRA technique without contrast. COMPARISON:  CT HEAD September 13, 2015 and MRI head August 31, 2013 FINDINGS: MRI HEAD FINDINGS INTRACRANIAL CONTENTS: 18 x 36 mm (transverse by AP) reduced diffusion mesial RIGHT temporal lobe and hippocampus with low ADC values. Corresponding bright FLAIR T2 hyperintense signal. Multiple punctate foci of reduced diffusion RIGHT occipital lobe. No susceptibility artifact to suggest hemorrhage. The ventricles and sulci are normal for patient's age. Small focus of encephalomalacia RIGHT posterior temporal lobe. No suspicious parenchymal signal, masses or mass effect. Scattered subcentimeter supratentorial white matter FLAIR T2 hyperintensities are less than expected for age, most commonly seen with  chronic small vessel ischemic disease. No abnormal extra-axial fluid collections. No extra-axial masses though, contrast enhanced sequences would be more sensitive. Normal major intracranial vascular flow voids present at skull base. ORBITS: The included ocular globes and orbital contents are non-suspicious. SINUSES: The mastoid air-cells and included paranasal sinuses are well-aerated. SKULL/SOFT TISSUES: No abnormal sellar expansion. No suspicious calvarial bone marrow signal. Craniocervical junction maintained. MRA HEAD FINDINGS ANTERIOR CIRCULATION: Normal flow related enhancement of the included cervical, petrous, cavernous and supraclinoid internal carotid arteries. Patent anterior communicating artery. Flow related enhancement of the anterior and middle cerebral arteries, including distal segments. High-grade stenosis proximal LEFT M2 segment. No large vessel occlusion, abnormal luminal irregularity, aneurysm. POSTERIOR CIRCULATION: LEFT vertebral artery is dominant. Basilar artery is patent, with normal flow related enhancement of the main branch vessels. Fetal origin RIGHT posterior cerebral artery. High-grade stenosis RIGHT P2 segment with thready flow related enhancement the remainder of the RIGHT posterior cerebral artery. No scratch abnormal luminal irregularity, aneurysm. IMPRESSION: MRI HEAD: Acute RIGHT posterior cerebral artery territory (RIGHT temporal lobe/ hippocampus and RIGHT occipital lobe) without hemorrhagic transformation. Small RIGHT temporal lobe encephalomalacia may be posttraumatic or ischemic. Otherwise negative MRI of the head for age. MRA HEAD: Fetal origin RIGHT posterior cerebral artery with severe flow-limiting stenosis RIGHT P2 segment. Severe stenosis proximal LEFT M2 segment. Electronically Signed   By: Elon Alas M.D.   On: 09/15/2015 04:09   Mr Brain Wo Contrast  09/15/2015  CLINICAL DATA:  Unwitnessed fall September 13, 2015, subsequent headache and intermittent  confusion. Follow-up RIGHT temporal lobe infarct. EXAM: MRI HEAD WITHOUT CONTRAST MRA HEAD WITHOUT CONTRAST TECHNIQUE: Multiplanar, multiecho pulse sequences of the brain and surrounding structures were obtained without intravenous contrast. Angiographic images of the head were obtained using MRA technique without contrast. COMPARISON:  CT HEAD September 13, 2015 and MRI head August 31, 2013 FINDINGS: MRI HEAD FINDINGS INTRACRANIAL CONTENTS: 18 x 36 mm (transverse by AP) reduced diffusion mesial RIGHT temporal lobe and hippocampus with low ADC values. Corresponding  bright FLAIR T2 hyperintense signal. Multiple punctate foci of reduced diffusion RIGHT occipital lobe. No susceptibility artifact to suggest hemorrhage. The ventricles and sulci are normal for patient's age. Small focus of encephalomalacia RIGHT posterior temporal lobe. No suspicious parenchymal signal, masses or mass effect. Scattered subcentimeter supratentorial white matter FLAIR T2 hyperintensities are less than expected for age, most commonly seen with chronic small vessel ischemic disease. No abnormal extra-axial fluid collections. No extra-axial masses though, contrast enhanced sequences would be more sensitive. Normal major intracranial vascular flow voids present at skull base. ORBITS: The included ocular globes and orbital contents are non-suspicious. SINUSES: The mastoid air-cells and included paranasal sinuses are well-aerated. SKULL/SOFT TISSUES: No abnormal sellar expansion. No suspicious calvarial bone marrow signal. Craniocervical junction maintained. MRA HEAD FINDINGS ANTERIOR CIRCULATION: Normal flow related enhancement of the included cervical, petrous, cavernous and supraclinoid internal carotid arteries. Patent anterior communicating artery. Flow related enhancement of the anterior and middle cerebral arteries, including distal segments. High-grade stenosis proximal LEFT M2 segment. No large vessel occlusion, abnormal luminal irregularity,  aneurysm. POSTERIOR CIRCULATION: LEFT vertebral artery is dominant. Basilar artery is patent, with normal flow related enhancement of the main branch vessels. Fetal origin RIGHT posterior cerebral artery. High-grade stenosis RIGHT P2 segment with thready flow related enhancement the remainder of the RIGHT posterior cerebral artery. No scratch abnormal luminal irregularity, aneurysm. IMPRESSION: MRI HEAD: Acute RIGHT posterior cerebral artery territory (RIGHT temporal lobe/ hippocampus and RIGHT occipital lobe) without hemorrhagic transformation. Small RIGHT temporal lobe encephalomalacia may be posttraumatic or ischemic. Otherwise negative MRI of the head for age. MRA HEAD: Fetal origin RIGHT posterior cerebral artery with severe flow-limiting stenosis RIGHT P2 segment. Severe stenosis proximal LEFT M2 segment. Electronically Signed   By: Elon Alas M.D.   On: 09/15/2015 04:09   Dg Chest Port 1 View  09/13/2015  CLINICAL DATA:  Shortness of breath EXAM: PORTABLE CHEST 1 VIEW COMPARISON:  09/11/2015 chest radiograph. FINDINGS: Slightly low lung volumes. Stable cardiomediastinal silhouette with mild cardiomegaly. No pneumothorax. Stable small bilateral pleural effusions. Mild pulmonary edema, decreased. Stable patchy bibasilar lung opacities. IMPRESSION: 1. Stable mild cardiomegaly and decreased mild pulmonary edema, suggesting improving but persistent mild congestive heart failure. 2. Stable small bilateral pleural effusions and patchy bibasilar lung opacities favoring atelectasis. Electronically Signed   By: Ilona Sorrel M.D.   On: 09/13/2015 18:53    Medications:   . antiseptic oral rinse  7 mL Mouth Rinse BID  . aspirin  81 mg Oral Daily  . atorvastatin  80 mg Oral q1800  . clopidogrel  75 mg Oral Q breakfast  . enoxaparin (LOVENOX) injection  40 mg Subcutaneous Q24H  . feeding supplement  1 Container Oral BID BM  . folic acid-pyridoxine-cyancobalamin  1 tablet Oral Daily  . levothyroxine  25  mcg Oral QAC breakfast  . magnesium oxide  200 mg Oral Daily  . multivitamin  1 tablet Oral Daily  . pantoprazole sodium  40 mg Per Tube Daily  . polyethylene glycol  17 g Oral Daily   Continuous Infusions:   Time spent: 35 minutes with > 50% of time discussing current diagnostic test results, clinical impression and plan of care with family.    LOS: 7 days   [facility] Pager [phone]. If unable to reach me by pager, please call my cell phone at [phone].  *Please refer to [email or web address], password [email or web address] to get updated schedule on who will round on this patient, as hospitalists switch teams weekly. If 7PM-7AM,  please contact night-coverage at [email or web address], password [email or web address] for any overnight needs.  09/15/2015, 8:29 AM

## 2015-09-16 ENCOUNTER — Inpatient Hospital Stay (HOSPITAL_COMMUNITY): Payer: Medicare Other

## 2015-09-16 ENCOUNTER — Ambulatory Visit: Payer: Medicare Other | Attending: Family Medicine

## 2015-09-16 DIAGNOSIS — N390 Urinary tract infection, site not specified: Secondary | ICD-10-CM

## 2015-09-16 DIAGNOSIS — I119 Hypertensive heart disease without heart failure: Secondary | ICD-10-CM

## 2015-09-16 DIAGNOSIS — I63511 Cerebral infarction due to unspecified occlusion or stenosis of right middle cerebral artery: Secondary | ICD-10-CM

## 2015-09-16 DIAGNOSIS — N3000 Acute cystitis without hematuria: Secondary | ICD-10-CM

## 2015-09-16 DIAGNOSIS — E785 Hyperlipidemia, unspecified: Secondary | ICD-10-CM

## 2015-09-16 LAB — VAS US CAROTID
LEFT ECA DIAS: -15 cm/s
LEFT VERTEBRAL DIAS: 17 cm/s
LICAPDIAS: 11 cm/s
Left CCA dist dias: 9 cm/s
Left CCA dist sys: 47 cm/s
Left CCA prox dias: -9 cm/s
Left CCA prox sys: -61 cm/s
Left ICA dist dias: -37 cm/s
Left ICA dist sys: -107 cm/s
Left ICA prox sys: 34 cm/s
RCCADSYS: -52 cm/s
RCCAPDIAS: 13 cm/s
RIGHT ECA DIAS: -9 cm/s
RIGHT VERTEBRAL DIAS: 12 cm/s
Right CCA prox sys: 57 cm/s

## 2015-09-16 LAB — HEMOGLOBIN A1C
HEMOGLOBIN A1C: 5.3 % (ref 4.8–5.6)
MEAN PLASMA GLUCOSE: 105 mg/dL

## 2015-09-16 LAB — BRAIN NATRIURETIC PEPTIDE: B Natriuretic Peptide: 1214.1 pg/mL — ABNORMAL HIGH (ref 0.0–100.0)

## 2015-09-16 MED ORDER — PANTOPRAZOLE SODIUM 40 MG PO TBEC
40.0000 mg | DELAYED_RELEASE_TABLET | Freq: Every day | ORAL | Status: DC
  Administered 2015-09-16 – 2015-09-18 (×3): 40 mg via ORAL
  Filled 2015-09-16 (×3): qty 1

## 2015-09-16 MED ORDER — AMOXICILLIN-POT CLAVULANATE 500-125 MG PO TABS
1.0000 | ORAL_TABLET | Freq: Two times a day (BID) | ORAL | Status: DC
  Administered 2015-09-16 – 2015-09-18 (×5): 500 mg via ORAL
  Filled 2015-09-16 (×6): qty 1

## 2015-09-16 MED ORDER — FUROSEMIDE 10 MG/ML IJ SOLN
INTRAMUSCULAR | Status: AC
  Filled 2015-09-16: qty 4

## 2015-09-16 MED ORDER — FUROSEMIDE 10 MG/ML IJ SOLN
40.0000 mg | Freq: Once | INTRAMUSCULAR | Status: AC
  Administered 2015-09-16: 40 mg via INTRAVENOUS

## 2015-09-16 MED ORDER — SALINE SPRAY 0.65 % NA SOLN
1.0000 | NASAL | Status: DC | PRN
  Administered 2015-09-16 – 2015-09-18 (×3): 1 via NASAL
  Filled 2015-09-16: qty 44

## 2015-09-16 NOTE — Progress Notes (Signed)
Progress Note    Teresa Franklin  [id number] DOB: 1930/08/24  DOA: 09/08/2015 PCP: [name], MD    Brief Narrative:   Teresa Franklin is an 82 y.o. female with h/o subarachnoid hemorrhage in March 2015 in the setting of a fall, HTN and HLD Who was admitted 09/08/15 for NSTEMI and CHB/ cardiogenic shock.   Assessment/Plan:   Principal Problem:   NSTEMI (non-ST elevated myocardial infarction) ([place]) Acute marginal RCA branch successfully stented, Dr. Ellyn Hack. 75% residual proximal LAD lesion-no current plans for PCI to LAD (distal vessel fairly small and if symptoms develop as outpatient, consider rotational atherectomy of proximal LAD. Repeat echocardiogram normal EF (55-60%), reassuring. Previously normal EF. No beta blocker secondary to bradycardia. Continue statin and dual antiplatelet therapy.Chest x-ray done 09/13/15 which showed mild pulmonary edema for which the patient received Lasix. Palliative care to meet with family today to address goals of care.  Active Problems:   Right hip pain Plain films ordered.    Dyspnea CXR and BNP ordered by cardiologist.    Hypokalemia Repleted.    Cough/Hoarseness OK to use Robitussin PRN, but avoid Robitussin DM. ABG, chest x-ray and a CBC were done and she was given Xopenex to address shortness of breath. Chest x-ray showed mild pulmonary edema but decreased in amount.  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 No other focal deficits were noted. MRI/MRA confirmed findings. MRA shows severe flow limiting stenosis of the right P2 cerebral artery. Follow-up carotid Dopplers. 2-D echo negative for cardiac source of emboli. LDL 54, hemoglobin A1c pending. Already on dual antiplatelet therapy.    Nausea Appears to be chronic.   No evidence of new angina with troponins trending down.  Continue as needed Phenergan and reglan and ATC zofran.    Hyperlipidemia with target LDL less than 70 Continue statin.    Complete heart block (HCC) / heart block AV second-degree Observed in the Cath Lab. Temporary wire was discontinued but then had to be replaced on 09/09/15 because of hypotension associated with 2:1 AVB and HR of 45 bpm despite dopamine 20. Maintaining sinus rhythm. Family indicates that they would not want a permanent pacemaker placed. Avoid beta blockers.    Essential hypertension Managing with Lasix. Continue permissive hypertension for now given recent stroke and intracranial stenoses.    Cardiogenic shock ([place]) Needed dopamine to resolve. 2-D echo showed normal EF at 55-60 percent.    Acute respiratory failure with hypoxia (HCC)/Acute respiratory failure (HCC) Likely cardiogenic in origin from elevated left ventricular end-diastolic pressure, in setting ischemia. Successfully extubated 09/10/15. Chest x-ray done 09/11/15 which showed persistent pulmonary edema. Diuresis per cardiology. No plans for reintubation should she decompensate per family.    Hypothyroidism Continue Synthroid.    Hypophosphatemia Repleted.    UTI U/A sent 09/15/15. Rare bacteria seen on microscopy. Augmentin started by cardiologist. Send culture.   Family Communication/Anticipated D/C date and plan/Code Status   DVT prophylaxis: Lovenox ordered. Code Status: DNR Family Communication: Son updated at the bedside & daughter updated by telephone 09/15/15. Disposition Plan: To be determined.  Palliative care consulted. Likely will need SNF for rehab.   Medical Consultants:    Cardiology  Neurology   Procedures:   2 D Echo 09/09/15  Study Conclusions  - Left ventricle: The cavity size  was normal. There was mild  concentric hypertrophy of the septum. Systolic function was  normal. The estimated ejection fraction was  in the range of 55%  to 60%. Wall motion was normal; there were no regional wall  motion abnormalities. - Aortic valve: Transvalvular velocity was within the normal range.  There was no stenosis. There was no regurgitation. - Mitral valve: There was moderate regurgitation. Effective  regurgitant orifice (PISA): 0.26 cm^2. Regurgitant volume (PISA):  39 ml. - Left atrium: The atrium was mildly dilated. - Right ventricle: The cavity size was normal. Wall thickness was  normal. Systolic function was normal. - Right atrium: The atrium was mildly dilated. - Tricuspid valve: There was moderate regurgitation. - Pulmonary arteries: Systolic pressure was moderately to severely  increased. PA peak pressure: 58 mm Hg (S). - Inferior vena cava: The vessel was dilated. - Pericardium, extracardiac: There was a left pleural effusion.  Anti-Infectives:   Anti-infectives    Start     Dose/Rate Route Frequency Ordered Stop   09/16/15 1000  amoxicillin-clavulanate (AUGMENTIN) 500-125 MG per tablet 500 mg     1 tablet Oral Every 12 hours 09/16/15 0840        Subjective:   Teresa Franklin has multiple complaints.  She is bothered by dry eye today, ongoing nausea and weakness.  Poor appetite.   Objective:    Filed Vitals:   09/15/15 1352 09/15/15 2100 09/16/15 0725 09/16/15 0850  BP: 151/71 144/64 155/68   Pulse: 91 81 77   Temp: 98.6 F (37 C) 98.8 F (37.1 C) 98.4 F (36.9 C)   TempSrc: Oral Oral Oral   Resp: 18 19 17    Height:      Weight:      SpO2: 97% 96% 97% 94%    Intake/Output Summary (Last 24 hours) at 09/16/15 0945 Last data filed at 09/16/15 0700  Gross per 24 hour  Intake    300 ml  Output    100 ml  Net    200 ml   Filed Weights   09/09/15 0500 09/10/15 0447 09/11/15 0500  Weight: 63 kg (138 lb 14.2 oz) 62.4 kg (137 lb 9.1 oz) 62.3 kg (137 lb 5.6 oz)    Exam: General exam: More energetic today.  Respiratory system: Clear to auscultation. Respiratory effort  normal. Cardiovascular system: S1 & S2 heard, RRR. No JVD,  rubs, gallops or clicks. No murmurs. Gastrointestinal system: Abdomen is nondistended, soft and nontender. No organomegaly or masses felt. Normal bowel sounds heard. Central nervous system: Sleepy, oriented to self and place. No focal neurological deficits. Extremities: No clubbing, edema, or cyanosis. Skin: No rashes, lesions or ulcers Psychiatry: Judgement and insight appear normal. Mood & affect less anxious.   Data Reviewed:   I have personally reviewed following labs and imaging studies:  Labs: Basic Metabolic Panel:  Recent Labs Lab 09/09/15 2120  09/10/15 0357 09/10/15 2145 09/11/15 0440 09/12/15 0337 09/13/15 0342 09/15/15 0526  NA  --   --  136  --  139 140 138  --   K  --   < > 4.0  --  3.7 3.5 3.4*  --   CL  --   --  110  --  109 105 105  --   CO2  --   --  21*  --  22 25 25   --   GLUCOSE  --   --  160*  --  102* 132* 115*  --   BUN  --   --  25*  --  28* 29* 17  --   CREATININE  --   --  1.02*  --  0.99 1.06* 0.88 0.65  CALCIUM  --   --  8.3*  --  8.2* 8.6* 8.7*  --   MG 3.3*  --  2.7* 2.1 2.2 2.0  --   --   PHOS 3.0  --  2.4* 2.1* 2.8 2.4* 2.8  --   < > = values in this interval not displayed. GFR Estimated Creatinine Clearance: 46.8 mL/min (by C-G formula based on Cr of 0.65). Liver Function Tests:  Recent Labs Lab 09/10/15 0357  AST 46*  ALT 31  ALKPHOS 71  BILITOT 0.9  PROT 5.0*  ALBUMIN 2.2*   CBC:  Recent Labs Lab 09/10/15 0357 09/13/15 1910  WBC 7.8 7.8  NEUTROABS 6.3  --   HGB 9.0* 9.2*  HCT 27.7* 28.7*  MCV 93.0 93.8  PLT 108* 149*   Cardiac Enzymes:  Recent Labs Lab 09/13/15 1240 09/13/15 1910 09/14/15 0014  TROPONINI 4.29* 4.27* 3.46*   CBG:  Recent Labs Lab 09/10/15 1542 09/10/15 2139 09/11/15 0052 09/11/15 0444 09/11/15 0725  GLUCAP 112* 122* 113* 112* 85   SMicrobiology Recent Results (from the past 240 hour(s))  Surgical pcr screen     Status: None    Collection Time: 09/08/15 12:16 PM  Result Value Ref Range Status   MRSA, PCR NEGATIVE NEGATIVE Final   Staphylococcus aureus NEGATIVE NEGATIVE Final    Comment:        The Xpert SA Assay (FDA approved for NASAL specimens in patients over 2 years of age), is one component of a comprehensive surveillance program.  Test performance has been validated by [facility] for patients greater than or equal to 39 year old. It is not intended to diagnose infection nor to guide or monitor treatment.   MRSA PCR Screening     Status: None   Collection Time: 09/08/15  5:03 PM  Result Value Ref Range Status   MRSA by PCR NEGATIVE NEGATIVE Final    Comment:        The GeneXpert MRSA Assay (FDA approved for NASAL specimens only), is one component of a comprehensive MRSA colonization surveillance program. It is not intended to diagnose MRSA infection nor to guide or monitor treatment for MRSA infections.     Radiology: Mr Virgel Paling [id number] Contrast  09/15/2015  CLINICAL DATA:  Unwitnessed fall September 13, 2015, subsequent headache and intermittent confusion. Follow-up RIGHT temporal lobe infarct. EXAM: MRI HEAD WITHOUT CONTRAST MRA HEAD WITHOUT CONTRAST TECHNIQUE: Multiplanar, multiecho pulse sequences of the brain and surrounding structures were obtained without intravenous contrast. Angiographic images of the head were obtained using MRA technique without contrast. COMPARISON:  CT HEAD September 13, 2015 and MRI head August 31, 2013 FINDINGS: MRI HEAD FINDINGS INTRACRANIAL CONTENTS: 18 x 36 mm (transverse by AP) reduced diffusion mesial RIGHT temporal lobe and hippocampus with low ADC values. Corresponding bright FLAIR T2 hyperintense signal. Multiple punctate foci of reduced diffusion RIGHT occipital lobe. No susceptibility artifact to suggest hemorrhage. The ventricles and sulci are normal for patient's age. Small focus of encephalomalacia RIGHT posterior temporal lobe. No suspicious parenchymal signal,  masses or mass effect. Scattered subcentimeter supratentorial white matter FLAIR T2 hyperintensities are less than expected for age, most commonly seen with chronic small vessel ischemic disease. No abnormal extra-axial fluid collections. No extra-axial masses though, contrast enhanced sequences would be more sensitive. Normal major intracranial vascular flow voids present  at skull base. ORBITS: The included ocular globes and orbital contents are non-suspicious. SINUSES: The mastoid air-cells and included paranasal sinuses are well-aerated. SKULL/SOFT TISSUES: No abnormal sellar expansion. No suspicious calvarial bone marrow signal. Craniocervical junction maintained. MRA HEAD FINDINGS ANTERIOR CIRCULATION: Normal flow related enhancement of the included cervical, petrous, cavernous and supraclinoid internal carotid arteries. Patent anterior communicating artery. Flow related enhancement of the anterior and middle cerebral arteries, including distal segments. High-grade stenosis proximal LEFT M2 segment. No large vessel occlusion, abnormal luminal irregularity, aneurysm. POSTERIOR CIRCULATION: LEFT vertebral artery is dominant. Basilar artery is patent, with normal flow related enhancement of the main branch vessels. Fetal origin RIGHT posterior cerebral artery. High-grade stenosis RIGHT P2 segment with thready flow related enhancement the remainder of the RIGHT posterior cerebral artery. No scratch abnormal luminal irregularity, aneurysm. IMPRESSION: MRI HEAD: Acute RIGHT posterior cerebral artery territory (RIGHT temporal lobe/ hippocampus and RIGHT occipital lobe) without hemorrhagic transformation. Small RIGHT temporal lobe encephalomalacia may be posttraumatic or ischemic. Otherwise negative MRI of the head for age. MRA HEAD: Fetal origin RIGHT posterior cerebral artery with severe flow-limiting stenosis RIGHT P2 segment. Severe stenosis proximal LEFT M2 segment. Electronically Signed   By: Elon Alas  M.D.   On: 09/15/2015 04:09   Mr Brain Wo Contrast  09/15/2015  CLINICAL DATA:  Unwitnessed fall September 13, 2015, subsequent headache and intermittent confusion. Follow-up RIGHT temporal lobe infarct. EXAM: MRI HEAD WITHOUT CONTRAST MRA HEAD WITHOUT CONTRAST TECHNIQUE: Multiplanar, multiecho pulse sequences of the brain and surrounding structures were obtained without intravenous contrast. Angiographic images of the head were obtained using MRA technique without contrast. COMPARISON:  CT HEAD September 13, 2015 and MRI head August 31, 2013 FINDINGS: MRI HEAD FINDINGS INTRACRANIAL CONTENTS: 18 x 36 mm (transverse by AP) reduced diffusion mesial RIGHT temporal lobe and hippocampus with low ADC values. Corresponding bright FLAIR T2 hyperintense signal. Multiple punctate foci of reduced diffusion RIGHT occipital lobe. No susceptibility artifact to suggest hemorrhage. The ventricles and sulci are normal for patient's age. Small focus of encephalomalacia RIGHT posterior temporal lobe. No suspicious parenchymal signal, masses or mass effect. Scattered subcentimeter supratentorial white matter FLAIR T2 hyperintensities are less than expected for age, most commonly seen with chronic small vessel ischemic disease. No abnormal extra-axial fluid collections. No extra-axial masses though, contrast enhanced sequences would be more sensitive. Normal major intracranial vascular flow voids present at skull base. ORBITS: The included ocular globes and orbital contents are non-suspicious. SINUSES: The mastoid air-cells and included paranasal sinuses are well-aerated. SKULL/SOFT TISSUES: No abnormal sellar expansion. No suspicious calvarial bone marrow signal. Craniocervical junction maintained. MRA HEAD FINDINGS ANTERIOR CIRCULATION: Normal flow related enhancement of the included cervical, petrous, cavernous and supraclinoid internal carotid arteries. Patent anterior communicating artery. Flow related enhancement of the anterior and middle  cerebral arteries, including distal segments. High-grade stenosis proximal LEFT M2 segment. No large vessel occlusion, abnormal luminal irregularity, aneurysm. POSTERIOR CIRCULATION: LEFT vertebral artery is dominant. Basilar artery is patent, with normal flow related enhancement of the main branch vessels. Fetal origin RIGHT posterior cerebral artery. High-grade stenosis RIGHT P2 segment with thready flow related enhancement the remainder of the RIGHT posterior cerebral artery. No scratch abnormal luminal irregularity, aneurysm. IMPRESSION: MRI HEAD: Acute RIGHT posterior cerebral artery territory (RIGHT temporal lobe/ hippocampus and RIGHT occipital lobe) without hemorrhagic transformation. Small RIGHT temporal lobe encephalomalacia may be posttraumatic or ischemic. Otherwise negative MRI of the head for age. MRA HEAD: Fetal origin RIGHT posterior cerebral artery with severe flow-limiting stenosis RIGHT  P2 segment. Severe stenosis proximal LEFT M2 segment. Electronically Signed   By: Elon Alas M.D.   On: 09/15/2015 04:09    Medications:   . amoxicillin-clavulanate  1 tablet Oral Q12H  . antiseptic oral rinse  7 mL Mouth Rinse BID  . aspirin  81 mg Oral Daily  . atorvastatin  80 mg Oral q1800  . clopidogrel  75 mg Oral Q breakfast  . enoxaparin (LOVENOX) injection  40 mg Subcutaneous Q24H  . feeding supplement  1 Container Oral BID BM  . folic acid-pyridoxine-cyancobalamin  1 tablet Oral Daily  . levothyroxine  25 mcg Oral QAC breakfast  . magnesium oxide  200 mg Oral Daily  . methylPREDNISolone (SOLU-MEDROL) injection  40 mg Intravenous Daily  . multivitamin  1 tablet Oral Daily  . ondansetron  4 mg Oral Q8H  . pantoprazole sodium  40 mg Per Tube Daily  . polyethylene glycol  17 g Oral Daily   Continuous Infusions:   Time spent: 35 minutes.  The patient is medically complex with multiple co-morbidities and is at high risk for clinical deterioration and requires high complexity  decision making.     LOS: 8 days   [facility] Pager [phone]. If unable to reach me by pager, please call my cell phone at [phone].  *Please refer to [email or web address], password [email or web address] to get updated schedule on who will round on this patient, as hospitalists switch teams weekly. If 7PM-7AM, please contact night-coverage at [email or web address], password [email or web address] for any overnight needs.  09/16/2015, 9:45 AM

## 2015-09-16 NOTE — Progress Notes (Signed)
CARDIAC REHAB PHASE I   PRE:  Rate/Rhythm: 88 SR  BP:  Supine:   Sitting: 146/68  Standing:    SaO2: 92% 1/2L  MODE:  Ambulation: 140 ft   POST:  Rate/Rhythm: 91 SR  BP:  Supine:   Sitting: 148/72  Standing:    SaO2: 96% 1L 1345-1407 Pt walked 140 ft on 1L with gait belt use, rolling walker and asst x 2 with fairly steady gait. Motivated today. Tired easily but tolerated well. To bed after walk.   Graylon Good, RN BSN  09/16/2015 2:03 PM

## 2015-09-16 NOTE — Progress Notes (Addendum)
No charge note.  Initial Palliative meeting scheduled at 1:00 pm on 6/27 with Teresa Franklin and her two brothers.  Imogene Burn, [place] [facility] Pager: [phone]

## 2015-09-16 NOTE — Care Management Important Message (Signed)
Important Message  Patient Details  Name: Teresa Franklin MRN: [id number] Date of Birth: [birth year]   Medicare Important Message Given:  Yes    Nathen May 09/16/2015, 10:39 AM

## 2015-09-16 NOTE — Progress Notes (Signed)
PATIENT ID:  82 y/o female with h/o subarachnoid hemorrhage in March 2015 setting of a fall, also HTN and HLD admitted for NSTEMI and CHB/ cardiogenic shock.    SUBJECTIVE:  This morning Ms. Everage complains of dysuria. Her nausea has improved. She has not been out of bed since her fall on 6/23.  She also complains of sore throat, hoarseness and shortness of breath.   PHYSICAL EXAM Filed Vitals:   09/15/15 0447 09/15/15 1352 09/15/15 2100 09/16/15 0725  BP: 154/65 151/71 144/64 155/68  Pulse: 89 91 81 77  Temp:  98.6 F (37 C) 98.8 F (37.1 C) 98.4 F (36.9 C)  TempSrc:  Oral Oral Oral  Resp:  18 19 17   Height:      Weight:      SpO2:  97% 96% 97%   General:  Chronically ill-appearing elderly woman in no acute distress  Neck: JVP 3cm above the clavicle at 45 degrees Lungs:  Clear to auscultation bilaterally. No crackles, rhonchi, or wheezes.  Heart:   regular rate and rhythm. No murmurs, rubs, or gallops. Normal S1/S2. Abdomen:  Soft. Nontender, nondistended. Active bowel sounds. Extremities:  Warm and well-perfused. No lower extremity edema.  LABS: Lab Results  Component Value Date   TROPONINI 3.46* 09/14/2015   Results for orders placed or performed during the hospital encounter of 09/08/15 (from the past 24 hour(s))  Urinalysis, Routine w reflex microscopic (not at [facility])     Status: Abnormal   Collection Time: 09/15/15 12:33 PM  Result Value Ref Range   Color, Urine AMBER (A) YELLOW   APPearance TURBID (A) CLEAR   Specific Gravity, Urine 1.019 1.005 - 1.030   pH 8.0 5.0 - 8.0   Glucose, UA NEGATIVE NEGATIVE mg/dL   Hgb urine dipstick LARGE (A) NEGATIVE   Bilirubin Urine NEGATIVE NEGATIVE   Ketones, ur NEGATIVE NEGATIVE mg/dL   Protein, ur 100 (A) NEGATIVE mg/dL   Nitrite NEGATIVE NEGATIVE   Leukocytes, UA LARGE (A) NEGATIVE  Urine microscopic-add on     Status: Abnormal   Collection Time: 09/15/15 12:33 PM  Result Value Ref Range   Squamous Epithelial / LPF  0-5 (A) NONE SEEN   WBC, UA TOO NUMEROUS TO COUNT 0 - 5 WBC/hpf   RBC / HPF 6-30 0 - 5 RBC/hpf   Bacteria, UA RARE (A) NONE SEEN    Intake/Output Summary (Last 24 hours) at 09/16/15 0817 Last data filed at 09/15/15 1700  Gross per 24 hour  Intake    600 ml  Output    200 ml  Net    400 ml    Telmetry: PVCs  Echo 09/09/15: Study Conclusions  - Left ventricle: The cavity size was normal. There was mild  concentric hypertrophy of the septum. Systolic function was  normal. The estimated ejection fraction was in the range of 55%  to 60%. Wall motion was normal; there were no regional wall  motion abnormalities. - Aortic valve: Transvalvular velocity was within the normal range.  There was no stenosis. There was no regurgitation. - Mitral valve: There was moderate regurgitation. Effective  regurgitant orifice (PISA): 0.26 cm^2. Regurgitant volume (PISA):  39 ml. - Left atrium: The atrium was mildly dilated. - Right ventricle: The cavity size was normal. Wall thickness was  normal. Systolic function was normal. - Right atrium: The atrium was mildly dilated. - Tricuspid valve: There was moderate regurgitation. - Pulmonary arteries: Systolic pressure was moderately to severely  increased. PA peak pressure:  58 mm Hg (S). - Inferior vena cava: The vessel was dilated. - Pericardium, extracardiac: There was a left pleural effusion.  Carotid Doppler: 1-39% ICA stenosis bilaterally. (prelim report)  ASSESSMENT AND PLAN:  Principal Problem:   NSTEMI (non-ST elevated myocardial infarction) ([place]) Active Problems:   Hyperlipidemia with target LDL less than 70   Complete heart block (HCC)   Heart block AV second degree   Essential hypertension   Cardiogenic shock (HCC)   Acute respiratory failure with hypoxia (HCC)   Acute respiratory failure ([place])   Encounter for intubation   Hypophosphatemia   Hypokalemia   Acute right MCA stroke ([place])   Fall   # NSTEMI: Acute  marginal stented by Dr. Ellyn Hack, but she has a 75% residual proximal LAD lesion. After discussions between Dr. Angelena Form and Thayer Headings, [place], her she will not undergo further PCI at this time. The distal LAD is fairly small. If symptoms develop as an outpatient. Consider rotational atherectomy. However, there is concern for this jail some diagonal branches as well.  continue atorvastatin, aspirin and clopidogrel.   She is not on any antihypertensives as we're allowing permissive hypertension her neurology.  # CHB: Resolved.  Avoid nodal agents.  # Hypertensive heart disease:  Continue permissive hypertension for now given recent stroke and intracranial stenoses.  # Stroke: Ms. Towell had a fall on 6/23 and was subsequently seen to have an acute to subacute medial temporal ischemic infarct on the right. She was noted to have intra-cerebral stenoses. Carotid ultrasound shows bilateral mild ICA stenosis preliminarily. Full report is pending.  She has not exposed any focal deficits. Continue aspirin and Plavix.    She has reported hip pain after the fall. An arthrogram was ordered. This was changed to hip x-rays.  If the x-ray is negative we will order PT/OT.   # Shortness of breath: Etiology is unclear.  EF is 55-60%.  Her neck veins are elevated on exam but her lungs sound clear on exam. She does not have any lower extremity edema. We will obtain an chest x-ray as well as order a BNP.   # Cardiogenic shock: Resolved.  She remains off dopamine and LVEF is 55-60%,  # UTI: Ms. Silcott urinalysis was concerning for UTI. Will start Augmentin 500/125 mg twice a day 10 days.   # Dispo: SNF when stable.  Time spent: 30 minutes-Greater than 50% of this time was spent in counseling, explanation of diagnosis, planning of further management, and coordination of care.    Bobie Kistler C. Oval Linsey, MD, [facility] 09/16/2015 8:17 AM

## 2015-09-16 NOTE — Progress Notes (Signed)
PT Cancellation Note  Patient Details Name: Teresa Franklin MRN: [id number] DOB: 01/09/1931   Cancelled Treatment:    Reason Eval/Treat Not Completed: Patient at procedure or test/unavailable Pt off floor getting CXR and hip xray. Will follow up as time allows.   [name] 09/16/2015, 9:29 AM  Wray Kearns, PT, DPT [phone]

## 2015-09-16 NOTE — Progress Notes (Signed)
Physical Therapy Treatment Patient Details Name: Teresa Franklin MRN: [id number] DOB: June 09, 1930 Today's Date: 09/16/2015    History of Present Illness Pt adm with NSTEMI and underwent emergent cath and PCI on 6/18. Developed pulmonary edema during cath and intubated. Extubated 6/20. PMH - SDH, HTN, arthritis. Of note, pt s/p fall 6/23 in hospital and hit head. MRI- right PCA infarct    PT Comments    Patient progressing well with mobility despite hospital course of fall and now new CVA. Improved ambulation distance today with Min A for balance/safety. No motor deficits noted from CVA. Pt less anxious today and reports nausea has improved. Eager to return to [facility] and excited about rehab. Encouraged daily ambulation with RN. Will follow.  Follow Up Recommendations  SNF     Equipment Recommendations  None recommended by PT    Recommendations for Other Services       Precautions / Restrictions Precautions Precautions: Fall Restrictions Weight Bearing Restrictions: No    Mobility  Bed Mobility               General bed mobility comments: Up in chair upon PT arrival.   Transfers Overall transfer level: Needs assistance Equipment used: Rolling walker (2 wheeled) Transfers: Sit to/from Stand Sit to Stand: Min assist         General transfer comment: Min A to boost from low chair with cues for hand placement.   Ambulation/Gait Ambulation/Gait assistance: Min assist Ambulation Distance (Feet): 150 Feet Assistive device: Rolling walker (2 wheeled) Gait Pattern/deviations: Step-through pattern;Decreased stride length;Trunk flexed Gait velocity: decr   General Gait Details: Slow, mostly steady gait. Sp02 91% on 1L/min 02. Fatigues.   Stairs            Wheelchair Mobility    Modified Rankin (Stroke Patients Only)       Balance Overall balance assessment: Needs assistance Sitting-balance support: Feet supported;No upper extremity supported Sitting  balance-Leahy Scale: Fair     Standing balance support: During functional activity Standing balance-Leahy Scale: Poor Standing balance comment: Reliant on BUEs for support.                    Cognition Arousal/Alertness: Awake/alert Behavior During Therapy: WFL for tasks assessed/performed;Anxious Overall Cognitive Status: Within Functional Limits for tasks assessed                      Exercises      General Comments        Pertinent Vitals/Pain Pain Assessment: No/denies pain    Home Living                      Prior Function            PT Goals (current goals can now be found in the care plan section) Progress towards PT goals: Progressing toward goals    Frequency  Min 3X/week    PT Plan Current plan remains appropriate    Co-evaluation             End of Session Equipment Utilized During Treatment: Gait belt Activity Tolerance: Patient tolerated treatment well Patient left: in chair;with call bell/phone within reach     Time: 1137-1202 PT Time Calculation (min) (ACUTE ONLY): 25 min  Charges:  $Gait Training: 23-37 mins                    G Codes:      Charlestine Rookstool A  Lachele Lievanos 09/16/2015, 12:26 PM Wray Kearns, PT, DPT [phone] `2

## 2015-09-17 DIAGNOSIS — N390 Urinary tract infection, site not specified: Secondary | ICD-10-CM

## 2015-09-17 DIAGNOSIS — I10 Essential (primary) hypertension: Secondary | ICD-10-CM

## 2015-09-17 DIAGNOSIS — I5033 Acute on chronic diastolic (congestive) heart failure: Secondary | ICD-10-CM | POA: Diagnosis not present

## 2015-09-17 DIAGNOSIS — R0602 Shortness of breath: Secondary | ICD-10-CM | POA: Insufficient documentation

## 2015-09-17 LAB — URINE CULTURE

## 2015-09-17 MED ORDER — FUROSEMIDE 40 MG PO TABS
40.0000 mg | ORAL_TABLET | Freq: Every day | ORAL | Status: DC
  Administered 2015-09-17 – 2015-09-18 (×2): 40 mg via ORAL
  Filled 2015-09-17 (×2): qty 1

## 2015-09-17 NOTE — Progress Notes (Signed)
CARDIAC REHAB PHASE I   PRE:  Rate/Rhythm: 82 SR  BP:  Supine: 120/56  Sitting:   Standing:    SaO2: 98-99% 2L  MODE:  Ambulation: 400 ft   POST:  Rate/Rhythm: 95 SR  BP:  Supine:   Sitting: 131/56  Standing:    SaO2: 95% 1L 1026-1100 Pt in good spirits today. Wants hair washed. Requested that shoes be put on and she did better. Walked 400 ft with rolling walker on 1L with asst x 2. Did not stop to rest and no c/o CP. To recliner after walk . Left off oxygen and asked staff to check sat later.    Graylon Good, RN BSN  09/17/2015 10:55 AM   82 SR

## 2015-09-17 NOTE — Progress Notes (Signed)
Patient Name: Teresa Franklin Date of Encounter: 09/17/2015  Hospital Problem List     Principal Problem:   NSTEMI (non-ST elevated myocardial infarction) [facility]) Active Problems:   Hyperlipidemia with target LDL less than 70   Complete heart block (HCC)   Heart block AV second degree   Essential hypertension   Cardiogenic shock ([place])   Acute respiratory failure with hypoxia ([place])   Acute respiratory failure ([place])   Encounter for intubation   Hypophosphatemia   Hypokalemia   Acute right MCA stroke ([place])   Fall   UTI (urinary tract infection)   Diastolic CHF, acute on chronic ([place])    Subjective   Reports she is having a better day. Breathing is better, did not sleep well last night.   Inpatient Medications    . amoxicillin-clavulanate  1 tablet Oral Q12H  . antiseptic oral rinse  7 mL Mouth Rinse BID  . aspirin  81 mg Oral Daily  . atorvastatin  80 mg Oral q1800  . clopidogrel  75 mg Oral Q breakfast  . enoxaparin (LOVENOX) injection  40 mg Subcutaneous Q24H  . feeding supplement  1 Container Oral BID BM  . folic acid-pyridoxine-cyancobalamin  1 tablet Oral Daily  . levothyroxine  25 mcg Oral QAC breakfast  . magnesium oxide  200 mg Oral Daily  . methylPREDNISolone (SOLU-MEDROL) injection  40 mg Intravenous Daily  . multivitamin  1 tablet Oral Daily  . ondansetron  4 mg Oral Q8H  . pantoprazole  40 mg Oral Daily  . polyethylene glycol  17 g Oral Daily    Vital Signs    Filed Vitals:   09/16/15 0850 09/16/15 1327 09/16/15 2001 09/17/15 0409  BP:  129/61 141/72 164/99  Pulse:  84 85 91  Temp:  98.2 F (36.8 C) 98.4 F (36.9 C) 97.8 F (36.6 C)  TempSrc:  Oral Oral Oral  Resp:  16  20  Height:      Weight:      SpO2: 94% 96% 98% 91%    Intake/Output Summary (Last 24 hours) at 09/17/15 0927 Last data filed at 09/17/15 0851  Gross per 24 hour  Intake    180 ml  Output      0 ml  Net    180 ml   Filed Weights   09/09/15 0500 09/10/15 0447 09/11/15 0500    Weight: 138 lb 14.2 oz (63 kg) 137 lb 9.1 oz (62.4 kg) 137 lb 5.6 oz (62.3 kg)    Physical Exam    General: Pleasant, but frail appearing older female, NAD. Neuro: Alert and oriented X 3. Moves all extremities spontaneously. Psych: Normal affect. HEENT:  Normal  Neck: Supple without bruits, + JVD. Lungs:  Resp regular and unlabored, CTA. Heart: RRR no s3, s4, or murmurs. Abdomen: Soft, non-tender, non-distended, BS + x 4.  Extremities: No clubbing, cyanosis or edema. DP/PT/Radials 2+ and equal bilaterally.  Labs    Basic Metabolic Panel  Recent Labs  09/15/15 0526  CREATININE 0.65   Hemoglobin A1C  Recent Labs  09/15/15 0526  HGBA1C 5.3    Telemetry    SR Pvcs  ECG    No recent  Radiology    Dg Chest 1 View  09/16/2015  CLINICAL DATA:  Fall. EXAM: CHEST 1 VIEW COMPARISON:  Radiograph of September 13, 2015. FINDINGS: Stable cardiomediastinal silhouette. No pneumothorax is noted. Mild central pulmonary vascular congestion and probable bilateral pulmonary edema is noted. Probable mild bilateral pleural effusions are  noted as well. Atherosclerosis of thoracic aorta is noted. Severe degenerative changes seen involving the right glenohumeral joint. IMPRESSION: Aortic atherosclerosis. Central pulmonary vascular congestion and probable bilateral perihilar edema is noted with associated mild bilateral pleural effusions. Electronically Signed   By: Marijo Conception, M.D.   On: 09/16/2015 09:58   Echo 09/09/15: Study Conclusions  - Left ventricle: The cavity size was normal. There was mild  concentric hypertrophy of the septum. Systolic function was  normal. The estimated ejection fraction was in the range of 55%  to 60%. Wall motion was normal; there were no regional wall  motion abnormalities. - Aortic valve: Transvalvular velocity was within the normal range.  There was no stenosis. There was no regurgitation. - Mitral valve: There was moderate regurgitation.  Effective  regurgitant orifice (PISA): 0.26 cm^2. Regurgitant volume (PISA):  39 ml. - Left atrium: The atrium was mildly dilated. - Right ventricle: The cavity size was normal. Wall thickness was  normal. Systolic function was normal. - Right atrium: The atrium was mildly dilated. - Tricuspid valve: There was moderate regurgitation. - Pulmonary arteries: Systolic pressure was moderately to severely  increased. PA peak pressure: 58 mm Hg (S). - Inferior vena cava: The vessel was dilated. - Pericardium, extracardiac: There was a left pleural effusion.  Carotid Doppler: 09/15/2015 Bilateral: mild soft plaque origin ICA. 1-39% ICA plaquing. Vertebral artery flow is antegrade.  Assessment & Plan    82 y/o female with h/o subarachnoid hemorrhage in March 2015 setting of a fall, also HTN and HLD admitted for NSTEMI and CHB/ cardiogenic shock  1. NSTEMI: Acute marginal stented by Dr. Ellyn Franklin, but she has a 75% residual proximal LAD lesion. After discussions between Dr. Angelena Franklin and Teresa Franklin, [place], her she will not undergo further PCI at this time. The distal LAD is fairly small. If symptoms develop as an outpatient, consider rotational atherectomy. However, there is concern for this jail some diagonal branches as well.  --Continue atorvastatin, aspirin and clopidogrel. --She is not on any antihypertensives as we're allowing permissive hypertension her neurology.  2. CHB/Second degree AV Block Mobitz 1: Required TMP. Resolved now.  --Continue to avoid nodal agents.  3. Hypertensive heart disease:  --Continue permissive hypertension for now given recent stroke and intracranial stenoses.  4. Stroke: Teresa Franklin had a fall on 6/23 and was subsequently seen to have an acute to subacute medial temporal ischemic infarct on the right. She was noted to have intra-cerebral stenoses. Carotid ultrasound shows bilateral mild ICA stenosis preliminarily. She has not experienced any focal deficits. She  has reported hip pain after the fall. An arthrogram was ordered. This was changed to hip x-rays, and results were negative. Will ask PT/OT to see -- Continue aspirin and Plavix.  5. Shortness of breath: Etiology is unclear. EF is 55-60%. --Does have some JVD, but does not have any lower extremity edema. --chest x-ray showed some vascular congestion, and perihilar edema, BNP elevated at 1214, last Cr 0.65. --Would resume lasix at 40mg  PO daily  6. Cardiogenic shock: Resolved.She remains off dopamine and LVEF is 55-60%,  7. UTI: Ms. Dibattista urinalysis was concerning for UTI. Augmentin started 500/125 mg twice a day 10 days yesterday   8.  Dispo: SNF when stable.  * Plan for Palliative Care meeting this afternoon with family per Palliative Notes.  Signed, Reino Bellis NP-C Pager [phone]
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
## 2015-09-17 NOTE — Progress Notes (Signed)
Progress Note    Teresa Franklin  [id number] DOB: 1931-02-07  DOA: 09/08/2015 PCP: [name], MD    Brief Narrative:   Teresa Franklin is an 82 y.o. female with h/o subarachnoid hemorrhage in March 2015 in the setting of a fall, HTN and HLD Who was admitted 09/08/15 for NSTEMI and CHB/ cardiogenic shock.   Assessment/Plan:   Principal Problem:   NSTEMI (non-ST elevated myocardial infarction) ([place]) Acute marginal RCA branch successfully stented by Dr. Ellyn Hack. 75% residual proximal LAD lesion-no current plans for PCI to LAD (distal vessel fairly small and if symptoms develop as outpatient, consider rotational atherectomy of proximal LAD). Repeat echocardiogram normal EF (55-60%), reassuring. Previously normal EF. No beta blocker secondary to bradycardia. Continue statin and dual antiplatelet therapy.Chest x-ray done 09/13/15 which showed mild pulmonary edema for which the patient received Lasix. Palliative care to meet with family today to address goals of care.  Active Problems:   Right hip pain / recent fall Plain films ordered. No acute abnormality seen.    Dyspnea secondary to acute on chronic diastolic CHF CXR done 0000000 and showed central pulmonary vascular congestion and probable bilateral perihilar edema with mild bilateral pleural effusions. BNP elevated at 1214.1. 40 mg of Lasix given last night. Respiratory status improved today. Continue Lasix 40 mg daily.    Hypokalemia Repleted.    Cough/Hoarseness OK to use Robitussin PRN, but avoid Robitussin DM. ABG, chest x-ray and a CBC were done and she was given Xopenex to address shortness of breath. Chest x-ray showed mild pulmonary edema but decreased in amount.  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 No  other focal deficits were noted. MRI/MRA confirmed findings. MRA shows severe flow limiting stenosis of the right P2 cerebral artery. Follow-up carotid Dopplers. 2-D echo negative for cardiac source of emboli. LDL 54, hemoglobin A1c pending. Already on dual antiplatelet therapy.    Nausea Appears to be chronic.  No evidence of new angina with troponins trending down.  Continue as needed Phenergan and reglan and ATC zofran.    Hyperlipidemia with target LDL less than 70 Continue statin.    Complete heart block (HCC) / heart block AV second-degree Observed in the Cath Lab. Temporary wire was discontinued but then had to be replaced on 09/09/15 because of hypotension associated with 2:1 AVB and HR of 45 bpm despite dopamine 20. Maintaining sinus rhythm. Family indicates that they would not want a permanent pacemaker placed. Avoid beta blockers.    Essential hypertension Managing with Lasix. Continue permissive hypertension for now given recent stroke and intracranial stenoses.    Cardiogenic shock ([place]) Needed dopamine to resolve. 2-D echo showed normal EF at 55-60 percent.    Acute respiratory failure with hypoxia (HCC)/Acute respiratory failure (HCC) Likely cardiogenic in origin from elevated left ventricular end-diastolic pressure, in setting ischemia. Successfully extubated 09/10/15. Chest x-ray done 09/11/15 which showed persistent pulmonary edema. Diuresis per cardiology. No plans for reintubation should she decompensate per family.    Hypothyroidism Continue Synthroid.    Hypophosphatemia Repleted.    UTI U/A sent 09/15/15. Rare bacteria seen on microscopy. Augmentin started by cardiologist. Cultures polymicrobial. Dysuria has improved on antibiotics. Would treat, but doubt she needs a 10 day course of therapy. 5 days should be sufficient.   Family Communication/Anticipated D/C  date and plan/Code Status   DVT prophylaxis: Lovenox ordered. Code Status: DNR Family Communication:  Son updated at the bedside & daughter updated by telephone 09/15/15. Disposition Plan: To be determined.  Palliative care consulted. Likely will need SNF for rehab.   Medical Consultants:    Cardiology  Neurology   Procedures:   2 D Echo 09/09/15  Study Conclusions  - Left ventricle: The cavity size was normal. There was mild  concentric hypertrophy of the septum. Systolic function was  normal. The estimated ejection fraction was in the range of 55%  to 60%. Wall motion was normal; there were no regional wall  motion abnormalities. - Aortic valve: Transvalvular velocity was within the normal range.  There was no stenosis. There was no regurgitation. - Mitral valve: There was moderate regurgitation. Effective  regurgitant orifice (PISA): 0.26 cm^2. Regurgitant volume (PISA):  39 ml. - Left atrium: The atrium was mildly dilated. - Right ventricle: The cavity size was normal. Wall thickness was  normal. Systolic function was normal. - Right atrium: The atrium was mildly dilated. - Tricuspid valve: There was moderate regurgitation. - Pulmonary arteries: Systolic pressure was moderately to severely  increased. PA peak pressure: 58 mm Hg (S). - Inferior vena cava: The vessel was dilated. - Pericardium, extracardiac: There was a left pleural effusion.  Anti-Infectives:   Augmentin 09/16/15--->  Subjective:   [name] seems to be in better spirits today after her husband visited her yesterday. She ambulated 400 feet with physical therapy and reports that her breathing has improved. She said she had trouble sleeping last night because her Ativan was discontinued. Dysuria improved as well.  Objective:    Filed Vitals:   09/16/15 0850 09/16/15 1327 09/16/15 2001 09/17/15 0409  BP:  129/61 141/72 164/99  Pulse:  84 85 91  Temp:  98.2 F (36.8 C) 98.4 F (36.9 C) 97.8 F (36.6 C)  TempSrc:  Oral Oral Oral  Resp:  16  20  Height:      Weight:      SpO2: 94%  96% 98% 91%    Intake/Output Summary (Last 24 hours) at 09/17/15 [id number] Last data filed at 09/16/15 1100  Gross per 24 hour  Intake    180 ml  Output      0 ml  Net    180 ml   Filed Weights   09/09/15 0500 09/10/15 0447 09/11/15 0500  Weight: 63 kg (138 lb 14.2 oz) 62.4 kg (137 lb 9.1 oz) 62.3 kg (137 lb 5.6 oz)    Exam: General exam: No acute distress, remains in good spirits.  Respiratory system: Clear to auscultation. Respiratory effort normal. Cardiovascular system: S1 & S2 heard, RRR. No JVD,  rubs, gallops or clicks. No murmurs. Gastrointestinal system: Abdomen is nondistended, soft and nontender. No organomegaly or masses felt. Normal bowel sounds heard. Central nervous system: Alert, oriented to self and place. No focal neurological deficits. Extremities: No clubbing, edema, or cyanosis. Skin: No rashes, lesions or ulcers Psychiatry: Judgement and insight appear normal. Mood & affect less anxious.   Data Reviewed:   I have personally reviewed following labs and imaging studies:  Labs: Basic Metabolic Panel:  Recent Labs Lab 09/10/15 2145  09/11/15 0440 09/12/15 0337 09/13/15 0342 09/15/15 0526  NA  --   --  139 140 138  --   K  --   < > 3.7 3.5 3.4*  --   CL  --   --  109 105 105  --  CO2  --   --  22 25 25   --   GLUCOSE  --   --  102* 132* 115*  --   BUN  --   --  28* 29* 17  --   CREATININE  --   --  0.99 1.06* 0.88 0.65  CALCIUM  --   --  8.2* 8.6* 8.7*  --   MG 2.1  --  2.2 2.0  --   --   PHOS 2.1*  --  2.8 2.4* 2.8  --   < > = values in this interval not displayed. GFR Estimated Creatinine Clearance: 46.8 mL/min (by C-G formula based on Cr of 0.65). Liver Function Tests: No results for input(s): AST, ALT, ALKPHOS, BILITOT, PROT, ALBUMIN in the last 168 hours. CBC:  Recent Labs Lab 09/13/15 1910  WBC 7.8  HGB 9.2*  HCT 28.7*  MCV 93.8  PLT 149*   Cardiac Enzymes:  Recent Labs Lab 09/13/15 1240 09/13/15 1910 09/14/15 0014  TROPONINI  4.29* 4.27* 3.46*   CBG:  Recent Labs Lab 09/10/15 1542 09/10/15 2139 09/11/15 0052 09/11/15 0444 09/11/15 0725  GLUCAP 112* 122* 113* 112* 85   SMicrobiology Recent Results (from the past 240 hour(s))  Surgical pcr screen     Status: None   Collection Time: 09/08/15 12:16 PM  Result Value Ref Range Status   MRSA, PCR NEGATIVE NEGATIVE Final   Staphylococcus aureus NEGATIVE NEGATIVE Final    Comment:        The Xpert SA Assay (FDA approved for NASAL specimens in patients over 9 years of age), is one component of a comprehensive surveillance program.  Test performance has been validated by [facility] for patients greater than or equal to 2 year old. It is not intended to diagnose infection nor to guide or monitor treatment.   MRSA PCR Screening     Status: None   Collection Time: 09/08/15  5:03 PM  Result Value Ref Range Status   MRSA by PCR NEGATIVE NEGATIVE Final    Comment:        The GeneXpert MRSA Assay (FDA approved for NASAL specimens only), is one component of a comprehensive MRSA colonization surveillance program. It is not intended to diagnose MRSA infection nor to guide or monitor treatment for MRSA infections.     Radiology: Dg Chest 1 View  09/16/2015  CLINICAL DATA:  Fall. EXAM: CHEST 1 VIEW COMPARISON:  Radiograph of September 13, 2015. FINDINGS: Stable cardiomediastinal silhouette. No pneumothorax is noted. Mild central pulmonary vascular congestion and probable bilateral pulmonary edema is noted. Probable mild bilateral pleural effusions are noted as well. Atherosclerosis of thoracic aorta is noted. Severe degenerative changes seen involving the right glenohumeral joint. IMPRESSION: Aortic atherosclerosis. Central pulmonary vascular congestion and probable bilateral perihilar edema is noted with associated mild bilateral pleural effusions. Electronically Signed   By: Marijo Conception, M.D.   On: 09/16/2015 09:58   Dg Hip Unilat With Pelvis 2-3 Views  Right  09/16/2015  CLINICAL DATA:  Right hip pain after fall. EXAM: DG HIP (WITH OR WITHOUT PELVIS) 2-3V RIGHT COMPARISON:  None. FINDINGS: Status post right total hip arthroplasty. No fracture or dislocation is noted. The prosthesis appears to be well situated. IMPRESSION: No acute abnormality seen in the right hip. Electronically Signed   By: Marijo Conception, M.D.   On: 09/16/2015 09:57    Medications:   . amoxicillin-clavulanate  1 tablet Oral Q12H  . antiseptic oral rinse  7  mL Mouth Rinse BID  . aspirin  81 mg Oral Daily  . atorvastatin  80 mg Oral q1800  . clopidogrel  75 mg Oral Q breakfast  . enoxaparin (LOVENOX) injection  40 mg Subcutaneous Q24H  . feeding supplement  1 Container Oral BID BM  . folic acid-pyridoxine-cyancobalamin  1 tablet Oral Daily  . levothyroxine  25 mcg Oral QAC breakfast  . magnesium oxide  200 mg Oral Daily  . methylPREDNISolone (SOLU-MEDROL) injection  40 mg Intravenous Daily  . multivitamin  1 tablet Oral Daily  . ondansetron  4 mg Oral Q8H  . pantoprazole  40 mg Oral Daily  . polyethylene glycol  17 g Oral Daily   Continuous Infusions:   Time spent: 25 minutes.     LOS: 9 days   [facility] Pager [phone]. If unable to reach me by pager, please call my cell phone at [phone].  *Please refer to [email or web address], password [email or web address] to get updated schedule on who will round on this patient, as hospitalists switch teams weekly. If 7PM-7AM, please contact night-coverage at [email or web address], password [email or web address] for any overnight needs.  09/17/2015, 8:22 AM

## 2015-09-18 DIAGNOSIS — I5033 Acute on chronic diastolic (congestive) heart failure: Secondary | ICD-10-CM

## 2015-09-18 DIAGNOSIS — E876 Hypokalemia: Secondary | ICD-10-CM

## 2015-09-18 MED ORDER — SALINE SPRAY 0.65 % NA SOLN: 1.0000 | NASAL | Status: DC | PRN

## 2015-09-18 MED ORDER — DIPHENHYDRAMINE HCL 12.5 MG/5ML PO ELIX
12.5000 mg | ORAL_SOLUTION | Freq: Once | ORAL | Status: AC
  Administered 2015-09-18: 12.5 mg via ORAL
  Filled 2015-09-18: qty 10

## 2015-09-18 MED ORDER — FUROSEMIDE 40 MG PO TABS: 40.0000 mg | ORAL_TABLET | Freq: Every day | ORAL | Status: DC

## 2015-09-18 MED ORDER — LORAZEPAM 0.5 MG PO TABS: 0.5000 mg | ORAL_TABLET | Freq: Two times a day (BID) | ORAL | Status: DC | PRN

## 2015-09-18 MED ORDER — AMOXICILLIN-POT CLAVULANATE 500-125 MG PO TABS: 1.0000 | ORAL_TABLET | Freq: Two times a day (BID) | ORAL | Status: DC

## 2015-09-18 MED ORDER — ONDANSETRON 4 MG PO TBDP: 4.0000 mg | ORAL_TABLET | Freq: Three times a day (TID) | ORAL | Status: DC

## 2015-09-18 MED ORDER — GUAIFENESIN ER 600 MG PO TB12
600.0000 mg | ORAL_TABLET | Freq: Once | ORAL | Status: AC
  Administered 2015-09-18: 600 mg via ORAL
  Filled 2015-09-18: qty 1

## 2015-09-18 NOTE — Discharge Instructions (Signed)
Acute Coronary Syndrome  Acute coronary syndrome (ACS) is a serious problem in which there is suddenly not enough blood and oxygen supplied to the heart. ACS may mean that one or more of the blood vessels in your heart (coronary arteries) may be blocked. ACS can result in chest pain or a heart attack (myocardial infarction or MI).  CAUSES  This condition is caused by atherosclerosis, which is the buildup of fat and cholesterol (plaque) on the inside of the arteries. Over time, the plaque may narrow or block the artery, and this will lessen blood flow to the heart. Plaque can also become weak and break off within a coronary artery to form a clot and cause a sudden blockage.  RISK FACTORS  The risks factors of this condition include:   High cholesterol levels.   High blood pressure (hypertension).   Smoking.   Diabetes.   Age.   Family history of chest pain, heart disease, or stroke.   Lack of exercise.  SYMPTOMS  The most common signs of this condition include:   Chest pain, which can be:    A crushing or squeezing in the chest.    A tightness, pressure, fullness, or heaviness in the chest.    Present for more than a few minutes, or it can stop and recur.   Pain in the arms, neck, jaw, or back.   Unexplained heartburn or indigestion.   Shortness of breath.   Nausea.   Sudden cold sweats.   Feeling light-headed or dizzy.  Sometimes, this condition has no symptoms.  DIAGNOSIS  ACS may be diagnosed through the following tests:   Electrocardiogram (ECG).   Blood tests.   Coronary angiogram. This is a procedure to look at the coronary arteries to see if there is any blockage.  TREATMENT  Treatment for ACS may include:   Healthy behavioral changes to reduce or control risk factors.   Medicine.   Coronary stenting.A stent helps to keep an artery open.   Coronary angioplasty. This procedure widens a narrowed or blocked artery.   Coronary artery bypass surgery. This will allow your blood to pass the  blockage (bypass) to reach your heart.  HOME CARE INSTRUCTIONS  Eating and Drinking   Follow a heart-healthy diet. A dietitian can you help to educate you about healthy food options and changes.   Use healthy cooking methods such as roasting, grilling, broiling, baking, poaching, steaming, or stir-frying. Talk to a dietitian to learn more about healthy cooking methods.  Medicines   Take medicines only as directed by your health care provider.   Do not take the following medicines unless your health care provider approves:    Nonsteroidal anti-inflammatory drugs (NSAIDs), such as ibuprofen, naproxen, or celecoxib.    Vitamin supplements that contain vitamin A, vitamin E, or both.    Hormone replacement therapy that contains estrogen with or without progestin.   Stop illegal drug use.  Activities   Follow an exercise program that is approved by your health care provider.   Plan rest periods when you are fatigued.  Lifestyle   Do not use any tobacco products, including cigarettes, chewing tobacco, or electronic cigarettes. If you need help quitting, ask your health care provider.   If you drink alcohol, and your health care provider approves, limit your alcohol intake to no more than 1 drink per day. One drink equals 12 ounces of beer, 5 ounces of wine, or 1 ounces of hard liquor.   Learn to manage   stress.   Maintain a healthy weight. Lose weight as approved by your health care provider.  General Instructions   Manage other health conditions, such as hypertension and diabetes, as directed by your health care provider.   Keep all follow-up visits as directed by your health care provider. This is important.   Your health care provider may ask you to monitor your blood pressure. A blood pressure reading consists of a higher number over a lower number, such as 110 over 72, written as 110/72. Ideally, your blood pressure should be:    Below 140/90 if you have no other medical conditions.    Below 130/80 if  you have diabetes or kidney disease.  SEEK IMMEDIATE MEDICAL CARE IF:   You have pain in your chest, neck, arm, jaw, stomach, or back that lasts more than a few minutes, is recurring, or is not relieved by taking medicine under your tongue (sublingual nitroglycerin).   You have profuse sweating without cause.   You have unexplained:    Heartburn or indigestion.    Shortness of breath or difficulty breathing.    Nausea or vomiting.    Fatigue.    Feelings of nervousness or anxiety.    Weakness.    Diarrhea.   You have sudden light-headedness or dizziness.   You faint.  These symptoms may represent a serious problem that is an emergency. Do not wait to see if the symptoms will go away. Get medical help right away. Call your local emergency services (911 in the [place] Do not drive yourself to the clinic or hospital.     This information is not intended to replace advice given to you by your health care provider. Make sure you discuss any questions you have with your health care provider.     Document Released: 03/09/2005 Document Revised: 03/30/2014 Document Reviewed: 07/11/2013  Elsevier Interactive Patient Education 2016 [employer]

## 2015-09-18 NOTE — Progress Notes (Signed)
PT Cancellation Note  Patient Details Name: Teresa Franklin MRN: [id number] DOB: 1930-12-18   Cancelled Treatment:    Reason Eval/Treat Not Completed: Fatigue/lethargy limiting ability to participate; patient reports not to sleep until 4 AM.  Requests to be left alone to rest today.  Will attempt again another day.    Reginia Naas 09/18/2015, 10:53 AM  Magda Kiel, PT [phone] 09/18/2015

## 2015-09-18 NOTE — Progress Notes (Signed)
Patient discharged to [facility] by ambulance. IV was dc'd and was intact. Report was called and given to her nurse at [facility]. All her belongings were sent with her.

## 2015-09-18 NOTE — Progress Notes (Signed)
Patient Name: Teresa Franklin Date of Encounter: 09/18/2015  Hospital Problem List     Principal Problem:   NSTEMI (non-ST elevated myocardial infarction) [facility]) Active Problems:   Hyperlipidemia with target LDL less than 70   Complete heart block (HCC)   Heart block AV second degree   Essential hypertension   Cardiogenic shock ([place])   Acute respiratory failure with hypoxia ([place])   Acute respiratory failure ([place])   Encounter for intubation   Hypophosphatemia   Hypokalemia   Acute right MCA stroke ([place])   Fall   UTI (urinary tract infection)   Diastolic CHF, acute on chronic (HCC)   SOB (shortness of breath)    Subjective   Frustrated this morning. Reports diarrhea last night, and unable to sleep.     Inpatient Medications    . amoxicillin-clavulanate  1 tablet Oral Q12H  . antiseptic oral rinse  7 mL Mouth Rinse BID  . aspirin  81 mg Oral Daily  . atorvastatin  80 mg Oral q1800  . clopidogrel  75 mg Oral Q breakfast  . enoxaparin (LOVENOX) injection  40 mg Subcutaneous Q24H  . feeding supplement  1 Container Oral BID BM  . folic acid-pyridoxine-cyancobalamin  1 tablet Oral Daily  . furosemide  40 mg Oral Daily  . levothyroxine  25 mcg Oral QAC breakfast  . magnesium oxide  200 mg Oral Daily  . methylPREDNISolone (SOLU-MEDROL) injection  40 mg Intravenous Daily  . multivitamin  1 tablet Oral Daily  . ondansetron  4 mg Oral Q8H  . pantoprazole  40 mg Oral Daily  . polyethylene glycol  17 g Oral Daily    Vital Signs    Filed Vitals:   09/17/15 0409 09/17/15 1438 09/17/15 1955 09/18/15 0352  BP: 164/99  161/82 166/78  Pulse: 91 87 90 87  Temp: 97.8 F (36.6 C)  98.4 F (36.9 C) 98.4 F (36.9 C)  TempSrc: Oral  Oral Oral  Resp: 20  20 18   Height:      Weight:      SpO2: 91% 97% 97% 98%    Intake/Output Summary (Last 24 hours) at 09/18/15 1036 Last data filed at 09/18/15 0019  Gross per 24 hour  Intake     60 ml  Output    200 ml  Net   -140 ml    Filed Weights   09/09/15 0500 09/10/15 0447 09/11/15 0500  Weight: 138 lb 14.2 oz (63 kg) 137 lb 9.1 oz (62.4 kg) 137 lb 5.6 oz (62.3 kg)    Physical Exam    General: Pleasant, but frail appearing older female, NAD. Neuro: Alert and oriented X 3. Moves all extremities spontaneously. Psych: Normal affect. HEENT:  Normal  Neck: Supple without bruits, + JVD. Lungs:  Resp regular and unlabored, CTA. Heart: RRR no s3, s4, or murmurs. Abdomen: Soft, non-tender, non-distended, BS + x 4.  Extremities: No clubbing, cyanosis or edema. DP/PT/Radials 2+ and equal bilaterally.  Labs    Basic Metabolic Panel No results for input(s): NA, K, CL, CO2, GLUCOSE, BUN, CREATININE, CALCIUM, MG, PHOS in the last 72 hours. Hemoglobin A1C No results for input(s): HGBA1C in the last 72 hours.  Telemetry    SR Pvcs  ECG    No recent  Radiology    Dg Chest 1 View  09/16/2015  CLINICAL DATA:  Fall. EXAM: CHEST 1 VIEW COMPARISON:  Radiograph of September 13, 2015. FINDINGS: Stable cardiomediastinal silhouette. No pneumothorax is noted. Mild central pulmonary  vascular congestion and probable bilateral pulmonary edema is noted. Probable mild bilateral pleural effusions are noted as well. Atherosclerosis of thoracic aorta is noted. Severe degenerative changes seen involving the right glenohumeral joint. IMPRESSION: Aortic atherosclerosis. Central pulmonary vascular congestion and probable bilateral perihilar edema is noted with associated mild bilateral pleural effusions. Electronically Signed   By: Marijo Conception, M.D.   On: 09/16/2015 09:58   Echo 09/09/15: Study Conclusions  - Left ventricle: The cavity size was normal. There was mild  concentric hypertrophy of the septum. Systolic function was  normal. The estimated ejection fraction was in the range of 55%  to 60%. Wall motion was normal; there were no regional wall  motion abnormalities. - Aortic valve: Transvalvular velocity was within the  normal range.  There was no stenosis. There was no regurgitation. - Mitral valve: There was moderate regurgitation. Effective  regurgitant orifice (PISA): 0.26 cm^2. Regurgitant volume (PISA):  39 ml. - Left atrium: The atrium was mildly dilated. - Right ventricle: The cavity size was normal. Wall thickness was  normal. Systolic function was normal. - Right atrium: The atrium was mildly dilated. - Tricuspid valve: There was moderate regurgitation. - Pulmonary arteries: Systolic pressure was moderately to severely  increased. PA peak pressure: 58 mm Hg (S). - Inferior vena cava: The vessel was dilated. - Pericardium, extracardiac: There was a left pleural effusion.  Carotid Doppler: 09/15/2015 Bilateral: mild soft plaque origin ICA. 1-39% ICA plaquing. Vertebral artery flow is antegrade.  Assessment & Plan    82 y/o female with h/o subarachnoid hemorrhage in March 2015 setting of a fall, also HTN and HLD admitted for NSTEMI and CHB/ cardiogenic shock  1. NSTEMI: Acute marginal stented by Dr. Ellyn Hack, but she has a 75% residual proximal LAD lesion. After discussions between Dr. Angelena Form and Thayer Headings, [place], her she will not undergo further PCI at this time. The distal LAD is fairly small. If symptoms develop as an outpatient, consider rotational atherectomy. However, there is concern for this jail some diagonal branches as well.  --Continue atorvastatin, aspirin and clopidogrel. --She is not on any antihypertensives as we're allowing permissive hypertension per neurology.  2. CHB/Second degree AV Block [place] 1: Required TMP. Resolved now.  --Continue to avoid nodal agents.  3. Hypertensive heart disease:  --Continue permissive hypertension for now given recent stroke and intracranial stenoses.  4. Stroke: Ms. Geraty had a fall on 6/23 and was subsequently seen to have an acute to subacute medial temporal ischemic infarct on the right. She was noted to have intra-cerebral stenoses.  Carotid ultrasound shows bilateral mild ICA stenosis preliminarily. She has not experienced any focal deficits. She has reported hip pain after the fall. An arthrogram was ordered. This was changed to hip x-rays, and results were negative.Walked with PT yesterday without stopping to rest and no reports of chest pain. -- Continue aspirin and Plavix for severe stenosis right P2 segment per neurology   5. Shortness of breath: Etiology is unclear. EF is 55-60%. --chest x-ray showed some vascular congestion, and perihilar edema, BNP elevated at 1214, last Cr 0.65. --started lasix at 40mg  PO daily yesterday, with volume status stable, would continue with current dose --stable from cardiology standpoint  6. Cardiogenic shock: Resolved.Echo showed LVEF is 55-60%,  7. UTI: Ms. Erk urinalysis was concerning for UTI. Augmentin 10 day course to be completed on 09/25/15  8.  Dispo: SNF   *She is frustrated this morning. Stating she was until with episodes of diarrhea this morning.  Nursing staff reports she was aggressive with them this morning. Patient reporting she just wants to rest today. She and family meet with palliative care yesterday, plans to continue with Hospice and Palliative Medicine at [facility].   Signed, Reino Bellis NP-C Pager [phone]

## 2015-09-18 NOTE — Progress Notes (Signed)
Pt for d/c today and has been asking to rest. Discussed MI, Plavix, stent, ex gl and CRPII. Will send referral to [facility]. Pt asked appropriate questions and is eager to return to her normal routine of going to the pool and housework. [place], ACSM 1:58 PM 09/18/2015

## 2015-09-18 NOTE — Clinical Social Work Placement (Signed)
   CLINICAL SOCIAL WORK PLACEMENT  NOTE  Date:  09/18/2015  Patient Details  Name: Teresa Franklin MRN: [id number] Date of Birth: Oct 30, 1930  Clinical Social Work is seeking post-discharge placement for this patient at the [place] level of care (*CSW will initial, date and re-position this form in  chart as items are completed):  Yes   Patient/family provided with [facility]'s list of facilities offering this level of care within the geographic area requested by the patient (or if unable, by the patient's family).  Yes   Patient/family informed of their freedom to choose among providers that offer the needed level of care, that participate in Medicare, Medicaid or managed care program needed by the patient, have an available bed and are willing to accept the patient.  Yes   Patient/family informed of [facility]'s ownership interest in [facility] and [facility], as well as of the fact that they are under no obligation to receive care at these facilities.  PASRR submitted to EDS on       PASRR number received on       Existing PASRR number confirmed on 09/12/15     FL2 transmitted to all facilities in geographic area requested by pt/family on 09/12/15     FL2 transmitted to all facilities within larger geographic area on       Patient informed that his/her managed care company has contracts with or will negotiate with certain facilities, including the following:        Yes   Patient/family informed of bed offers received.  Patient chooses bed at [facility], [place]     Physician recommends and patient chooses bed at      Patient to be transferred to [place] on 09/18/15.  Patient to be transferred to facility by ambulance     Patient family notified on 09/18/15 of transfer.  Name of family member notified:  [place]      PHYSICIAN Please prepare priority discharge summary, including  medications, Please sign FL2, Please prepare prescriptions     Additional Comment:  Per MD patient is ready to discharge to [facility], [place]. RN, patient, patient's family, and facility notified of discharge. RN given phone number for report and transport packet is on patient's chart. Ambulance transport requested. CSW signing off.   _______________________________________________ Samule Dry, LCSW 09/18/2015, 11:55 AM

## 2015-09-18 NOTE — Discharge Summary (Signed)
Physician Discharge Summary  XITLLALI CREELY [id number] DOB: 01-29-1931 DOA: 09/08/2015  PCP: [name], MD  Admit date: 09/08/2015 Discharge date: 09/18/2015  Time spent: 45 minutes  Recommendations for Outpatient Follow-up:  Patient will be discharged to skill nursing facility, with palliative care to follow.  Continue physical and occupational therapy as recommended.  Patient will need to follow up with primary care provider within one week of discharge.  Repeat CBC and BMP.  Follow up with cardiology. Patient should continue medications as prescribed.  Patient should follow a heart healthy diet.    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 Bettey Costa Teresa Franklin is a 82 y.o. female with a known history of Essential hypertension who presents with nausea. Patient is seen in emergency room yesterday for nausea and abdominal pain. She underwent CT scan which did not show evidence of acute etiology. She was sent home. She returns today with persistent nausea. She denies chest pain, shortness of breath, dyspnea exertion or vomiting. She is a fairly active woman. [place] 2 days a [place].  Hospital Course:  NSTEMI  (non-ST elevated myocardial infarction) ([place]) -Acute marginal RCA branch successfully stented by Dr. Ellyn Hack.  -75% residual proximal LAD lesion-no current plans for PCI to LAD (distal vessel fairly small and if symptoms develop as outpatient, consider rotational atherectomy of proximal LAD).  -Repeat echocardiogram normal EF (55-60%), reassuring. Previously normal EF.  -No beta blocker secondary to bradycardia.  -Continue statin and dual antiplatelet therapy. -Chest x-ray done 09/13/15 which showed mild pulmonary edema for which the patient received Lasix.  -Palliative care to meet with family  to address goals of care. MOST form completed  Right hip pain / recent fall -Plain films ordered. No acute abnormality seen.  Dyspnea secondary to acute on chronic diastolic CHF -CXR done 0000000 and showed central pulmonary vascular congestion and probable bilateral perihilar edema with mild bilateral pleural effusions.  -BNP elevated at 1214.1.  -40 mg of Lasix given last night.  -Respiratory status improved today.  -Continue Lasix 40 mg daily.  Hypokalemia -Repleted. -Repeat BMP in one week  Cough/Hoarseness -OK to use Robitussin PRN, but avoid Robitussin DM.  -ABG, chest x-ray and a CBC were done and she was given Xopenex to address shortness of breath.  -Chest x-ray showed mild pulmonary edema but decreased in amount.  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  -No other focal deficits were noted.  -MRI/MRA confirmed findings. MRA shows severe flow limiting stenosis of the right P2 cerebral artery.  -Carotid Dopplers:Bilateral: mild soft plaque origin ICA. 1-39% ICA plaquing. Vertebral artery flow is antegrade -2-D echo negative for cardiac source of emboli.  -  LDL 54 -hemoglobin A1c 5.3  -Already on dual  antiplatelet therapy.  Nausea -Appears to be chronic. No evidence of new angina with troponins trending down. -Continue as needed Phenergan and reglan and ATC zofran.  Hyperlipidemia with target LDL less than 70 -Continue statin.  Complete heart block (HCC) / heart block AV second-degree -Observed in the Cath Lab. Temporary wire was discontinued but then had to be replaced on 09/09/15 because of hypotension associated with 2:1 AVB and HR of 45 bpm despite dopamine 20.  -Maintaining sinus rhythm.  -Family indicated that they would not want a permanent pacemaker placed.  -Avoid beta blockers.  Essential hypertension -Managing with Lasix.  -Continue permissive hypertension for now given recent stroke and intracranial stenoses.  Cardiogenic shock ([place]) -Needed dopamine to resolve. 2-D echo showed normal EF at 55-60 percent.  Acute respiratory failure with hypoxia (HCC)/Acute respiratory failure (HCC) -Likely cardiogenic in origin from elevated left ventricular end-diastolic pressure, in setting ischemia.  -Successfully extubated 09/10/15.  -Chest x-ray done 09/11/15 which showed persistent pulmonary edema.  -Diuresis per cardiology.  -No plans for reintubation should she decompensate per family.  Hypothyroidism -Continue Synthroid.  Hypophosphatemia -Repleted.  UTI -U/A sent 09/15/15. Rare bacteria seen on microscopy.  -Augmentin started by cardiologist.  -Cultures polymicrobial. Dysuria has improved on antibiotics.  -Would treat, but doubt she needs a 10 day course of therapy. 5 days should be sufficient.  Code status: DNR  Procedures: Echocardiogram  Consultations: Cardiology Neurology PCCM Palliative care  Discharge Exam: Filed Vitals:   09/17/15 1955 09/18/15 0352  BP: 161/82 166/78  Pulse: 90 87  Temp: 98.4 F (36.9 C) 98.4 F (36.9 C)  Resp: 20 18     General: Well developed, elderly, frail, NAD  HEENT: NCAT,  mucous membranes moist.  Neck:  Supple  Cardiovascular: S1 S2 auscultated, no murmurs, RRR  Respiratory: Clear to auscultation bilaterally   Abdomen: Soft, nontender, nondistended, + bowel sounds  Extremities: warm dry without cyanosis clubbing or edema  Neuro: AAOx3, nonfocal  Psych: Appropriate  Discharge Instructions      Discharge Instructions    Discharge instructions    Complete by:  As directed   Patient will be discharged to skill nursing facility, with palliative care to follow.  Continue physical and occupational therapy as recommended.  Patient will need to follow up with primary care provider within one week of discharge.  Repeat CBC and BMP.  Follow up with cardiology. Patient should continue medications as prescribed.  Patient should follow a heart healthy diet.            Medication List    STOP taking these medications        carvedilol 12.5 MG tablet  Commonly known as:  COREG     lisinopril 40 MG tablet  Commonly known as:  PRINIVIL,ZESTRIL     meloxicam 7.5 MG tablet  Commonly known as:  MOBIC     pravastatin 40 MG tablet  Commonly known as:  PRAVACHOL      TAKE these medications        acetaminophen 325 MG tablet  Commonly known as:  TYLENOL  Take 650 mg by mouth every 6 (six) hours as needed for mild pain, fever or headache.     amoxicillin-clavulanate 500-125 MG tablet  Commonly known as:  AUGMENTIN  Take 1 tablet (500 mg total) by mouth every 12 (twelve) hours.     aspirin 81 MG chewable tablet  Chew 1 tablet (81 mg total) by  mouth daily.     atorvastatin 80 MG tablet  Commonly known as:  LIPITOR  Take 1 tablet (80 mg total) by mouth daily at 6 PM.     calcium-vitamin D 500-200 MG-UNIT tablet  Commonly known as:  OSCAL WITH D  Take 1 tablet by mouth 2 (two) times daily.     clopidogrel 75 MG tablet  Commonly known as:  PLAVIX  Take 1 tablet (75 mg total) by mouth daily with breakfast.     FOLBIC 2.5-25-2 MG Tabs tablet  Generic drug:  folic  acid-pyridoxine-cyancobalamin  Take 1 tablet by mouth daily.     furosemide 40 MG tablet  Commonly known as:  LASIX  Take 1 tablet (40 mg total) by mouth daily.     levothyroxine 25 MCG tablet  Commonly known as:  SYNTHROID, LEVOTHROID  Take 25 mcg by mouth daily before breakfast.     LORazepam 0.5 MG tablet  Commonly known as:  ATIVAN  Take 1 tablet (0.5 mg total) by mouth 2 (two) times daily as needed for anxiety.     Magnesium 250 MG Tabs  Take 250 mg by mouth daily.     metoCLOPramide 5 MG tablet  Commonly known as:  REGLAN  Take 1 tablet (5 mg total) by mouth every 8 (eight) hours as needed for nausea.     MULTIVITAMIN GUMMIES ADULT Chew  Chew 2 each by mouth at bedtime.     PRESERVISION AREDS 2 Caps  Take 1 capsule by mouth daily.     nitroGLYCERIN 0.4 MG SL tablet  Commonly known as:  NITROSTAT  Place 1 tablet (0.4 mg total) under the tongue every 5 (five) minutes x 3 doses as needed for chest pain.     omeprazole 20 MG capsule  Commonly known as:  PRILOSEC  Take 20 mg by mouth daily before breakfast.     ondansetron 4 MG disintegrating tablet  Commonly known as:  ZOFRAN-ODT  Take 1 tablet (4 mg total) by mouth every 8 (eight) hours.     promethazine 25 MG suppository  Commonly known as:  PHENERGAN  Place 1 suppository (25 mg total) rectally every 6 (six) hours as needed for nausea.     simethicone 80 MG chewable tablet  Commonly known as:  GAS-X  Chew 1 tablet (80 mg total) by mouth 4 (four) times daily as needed for flatulence.     sodium chloride 0.65 % Soln nasal spray  Commonly known as:  OCEAN  Place 1 spray into both nostrils as needed for congestion.       Allergies  Allergen Reactions  . Codeine Nausea And Vomiting  . Flomax [Tamsulosin] Other (See Comments)    Pt states that this medication gave her a kidney infection.    Lebron Quam [Hydrocodone-Acetaminophen] Nausea And Vomiting  . Sulfa Antibiotics Nausea And Vomiting   Follow-up  Information    Follow up with [facility] .   Specialty:  Skilled Nursing Facility   Contact information:   [place] [phone]      Follow up with [name], MD On 09/30/2015.   Specialty:  Cardiology   Why:  2:30 PM    Contact information:   [place] [address] [phone]       Follow up with [name], MD. Schedule an appointment as soon as possible for a visit in 1 week.   Specialty:  Family  Medicine   Why:  Hospital follow up   Contact information:   [place] [address] [phone]        The results of significant diagnostics from this hospitalization (including imaging, microbiology, ancillary and laboratory) are listed below for reference.    Significant Diagnostic Studies: Dg Chest 1 View  09/16/2015  CLINICAL DATA:  Fall. EXAM: CHEST 1 VIEW COMPARISON:  Radiograph of September 13, 2015. FINDINGS: Stable cardiomediastinal silhouette. No pneumothorax is noted. Mild central pulmonary vascular congestion and probable bilateral pulmonary edema is noted. Probable mild bilateral pleural effusions are noted as well. Atherosclerosis of thoracic aorta is noted. Severe degenerative changes seen involving the right glenohumeral joint. IMPRESSION: Aortic atherosclerosis. Central pulmonary vascular congestion and probable bilateral perihilar edema is noted with associated mild bilateral pleural effusions. Electronically Signed   By: Marijo Conception, M.D.   On: 09/16/2015 09:58   Ct Head Wo Contrast  09/13/2015  CLINICAL DATA:  Status post fall, with severe headache. Concern for head injury. Initial encounter. EXAM: CT HEAD WITHOUT CONTRAST TECHNIQUE: Contiguous axial images were obtained from the base of the skull through the vertex without intravenous contrast. COMPARISON:  CT of the head performed 03/26/2013, and MRI of the brain performed 08/31/2013  FINDINGS: There is an evolving acute or subacute infarct at the medial right temporal lobe. A tiny focus of increased attenuation along the posterior aspect of the infarct is similar in appearance to prior studies and likely reflects calcification, without evidence of hemorrhagic transformation. Prominence of the ventricles and sulci suggests mild cortical volume loss. Mild subcortical white matter change likely reflects small vessel ischemic microangiopathy. The brainstem and fourth ventricle are within normal limits. The basal ganglia are unremarkable in appearance. No midline shift is seen. There is no evidence of fracture; visualized osseous structures are unremarkable in appearance. The orbits are within normal limits. The paranasal sinuses and mastoid air cells are well-aerated. No significant soft tissue abnormalities are seen. IMPRESSION: 1. Evolving acute or subacute infarct at the medial right temporal lobe. Tiny focus of increased attenuation along the posterior aspect of the infarct is similar in appearance to prior studies and likely reflects calcification, without evidence of hemorrhage transformation. 2. Mild cortical volume loss and scattered small vessel ischemic microangiopathy. These results were called by telephone at the time of interpretation on 09/13/2015 at 10:54 pm to Dr. Rosanna Randy, who verbally acknowledged these results. Electronically Signed   By: Garald Balding M.D.   On: 09/13/2015 22:55   Mr Jodene Nam Head Wo Contrast  09/15/2015  CLINICAL DATA:  Unwitnessed fall September 13, 2015, subsequent headache and intermittent confusion. Follow-up RIGHT temporal lobe infarct. EXAM: MRI HEAD WITHOUT CONTRAST MRA HEAD WITHOUT CONTRAST TECHNIQUE: Multiplanar, multiecho pulse sequences of the brain and surrounding structures were obtained without intravenous contrast. Angiographic images of the head were obtained using MRA technique without contrast. COMPARISON:  CT HEAD September 13, 2015 and MRI head August 31, 2013 FINDINGS: MRI HEAD FINDINGS INTRACRANIAL CONTENTS: 18 x 36 mm (transverse by AP) reduced diffusion mesial RIGHT temporal lobe and hippocampus with low ADC values. Corresponding bright FLAIR T2 hyperintense signal. Multiple punctate foci of reduced diffusion RIGHT occipital lobe. No susceptibility artifact to suggest hemorrhage. The ventricles and sulci are normal for patient's age. Small focus of encephalomalacia RIGHT posterior temporal lobe. No suspicious parenchymal signal, masses or mass effect. Scattered subcentimeter supratentorial white matter FLAIR T2 hyperintensities are less than expected for age, most commonly seen with chronic small  vessel ischemic disease. No abnormal extra-axial fluid collections. No extra-axial masses though, contrast enhanced sequences would be more sensitive. Normal major intracranial vascular flow voids present at skull base. ORBITS: The included ocular globes and orbital contents are non-suspicious. SINUSES: The mastoid air-cells and included paranasal sinuses are well-aerated. SKULL/SOFT TISSUES: No abnormal sellar expansion. No suspicious calvarial bone marrow signal. Craniocervical junction maintained. MRA HEAD FINDINGS ANTERIOR CIRCULATION: Normal flow related enhancement of the included cervical, petrous, cavernous and supraclinoid internal carotid arteries. Patent anterior communicating artery. Flow related enhancement of the anterior and middle cerebral arteries, including distal segments. High-grade stenosis proximal LEFT M2 segment. No large vessel occlusion, abnormal luminal irregularity, aneurysm. POSTERIOR CIRCULATION: LEFT vertebral artery is dominant. Basilar artery is patent, with normal flow related enhancement of the main branch vessels. Fetal origin RIGHT posterior cerebral artery. High-grade stenosis RIGHT P2 segment with thready flow related enhancement the remainder of the RIGHT posterior cerebral artery. No scratch abnormal luminal irregularity,  aneurysm. IMPRESSION: MRI HEAD: Acute RIGHT posterior cerebral artery territory (RIGHT temporal lobe/ hippocampus and RIGHT occipital lobe) without hemorrhagic transformation. Small RIGHT temporal lobe encephalomalacia may be posttraumatic or ischemic. Otherwise negative MRI of the head for age. MRA HEAD: Fetal origin RIGHT posterior cerebral artery with severe flow-limiting stenosis RIGHT P2 segment. Severe stenosis proximal LEFT M2 segment. Electronically Signed   By: Elon Alas M.D.   On: 09/15/2015 04:09   Mr Brain Wo Contrast  09/15/2015  CLINICAL DATA:  Unwitnessed fall September 13, 2015, subsequent headache and intermittent confusion. Follow-up RIGHT temporal lobe infarct. EXAM: MRI HEAD WITHOUT CONTRAST MRA HEAD WITHOUT CONTRAST TECHNIQUE: Multiplanar, multiecho pulse sequences of the brain and surrounding structures were obtained without intravenous contrast. Angiographic images of the head were obtained using MRA technique without contrast. COMPARISON:  CT HEAD September 13, 2015 and MRI head August 31, 2013 FINDINGS: MRI HEAD FINDINGS INTRACRANIAL CONTENTS: 18 x 36 mm (transverse by AP) reduced diffusion mesial RIGHT temporal lobe and hippocampus with low ADC values. Corresponding bright FLAIR T2 hyperintense signal. Multiple punctate foci of reduced diffusion RIGHT occipital lobe. No susceptibility artifact to suggest hemorrhage. The ventricles and sulci are normal for patient's age. Small focus of encephalomalacia RIGHT posterior temporal lobe. No suspicious parenchymal signal, masses or mass effect. Scattered subcentimeter supratentorial white matter FLAIR T2 hyperintensities are less than expected for age, most commonly seen with chronic small vessel ischemic disease. No abnormal extra-axial fluid collections. No extra-axial masses though, contrast enhanced sequences would be more sensitive. Normal major intracranial vascular flow voids present at skull base. ORBITS: The included ocular globes and  orbital contents are non-suspicious. SINUSES: The mastoid air-cells and included paranasal sinuses are well-aerated. SKULL/SOFT TISSUES: No abnormal sellar expansion. No suspicious calvarial bone marrow signal. Craniocervical junction maintained. MRA HEAD FINDINGS ANTERIOR CIRCULATION: Normal flow related enhancement of the included cervical, petrous, cavernous and supraclinoid internal carotid arteries. Patent anterior communicating artery. Flow related enhancement of the anterior and middle cerebral arteries, including distal segments. High-grade stenosis proximal LEFT M2 segment. No large vessel occlusion, abnormal luminal irregularity, aneurysm. POSTERIOR CIRCULATION: LEFT vertebral artery is dominant. Basilar artery is patent, with normal flow related enhancement of the main branch vessels. Fetal origin RIGHT posterior cerebral artery. High-grade stenosis RIGHT P2 segment with thready flow related enhancement the remainder of the RIGHT posterior cerebral artery. No scratch abnormal luminal irregularity, aneurysm. IMPRESSION: MRI HEAD: Acute RIGHT posterior cerebral artery territory (RIGHT temporal lobe/ hippocampus and RIGHT occipital lobe) without hemorrhagic transformation. Small RIGHT temporal lobe encephalomalacia  may be posttraumatic or ischemic. Otherwise negative MRI of the head for age. MRA HEAD: Fetal origin RIGHT posterior cerebral artery with severe flow-limiting stenosis RIGHT P2 segment. Severe stenosis proximal LEFT M2 segment. Electronically Signed   By: Elon Alas M.D.   On: 09/15/2015 04:09   Ct Abdomen Pelvis W Contrast  09/05/2015  CLINICAL DATA:  Mid abdominal and periumbilical pain with nausea, vomiting, and diarrhea. EXAM: CT ABDOMEN AND PELVIS WITH CONTRAST TECHNIQUE: Multidetector CT imaging of the abdomen and pelvis was performed using the standard protocol following bolus administration of intravenous contrast. CONTRAST:  189mL ISOVUE-300 IOPAMIDOL (ISOVUE-300) INJECTION 61%  COMPARISON:  None. FINDINGS: Atelectasis or infiltration in the lung bases. Small esophageal hiatal hernia. Diffuse fatty infiltration of the liver. Sub cm focal spleen lesions are nonspecific though probably represent cysts or hemangiomas. Gallbladder, pancreas, adrenal glands, kidneys, inferior vena cava, and retroperitoneal lymph nodes are unremarkable. Calcification of abdominal aorta and branch vessels. No aneurysm. Stomach, small bowel, and colon are not abnormally distended. No free air or free fluid in the abdomen. Pelvis: The appendix is not identified. Uterus and ovaries are not enlarged. Bladder wall is not thickened. Diverticulosis and muscular hypertrophy of the sigmoid colon. No inflammatory changes to suggest diverticulitis. No free or loculated pelvic fluid collections. No pelvic mass or lymphadenopathy. Degenerative changes and scoliosis of the lumbar spine. Prior right hip arthroplasty. IMPRESSION: No evidence of bowel obstruction or inflammation. Diverticulosis of the sigmoid colon without evidence of diverticulitis. Diffuse fatty infiltration of the liver. Small esophageal hiatal hernia. Infiltrates or atelectasis in the lung bases. Electronically Signed   By: Lucienne Capers M.D.   On: 09/05/2015 22:55   Dg Chest Port 1 View  09/13/2015  CLINICAL DATA:  Shortness of breath EXAM: PORTABLE CHEST 1 VIEW COMPARISON:  09/11/2015 chest radiograph. FINDINGS: Slightly low lung volumes. Stable cardiomediastinal silhouette with mild cardiomegaly. No pneumothorax. Stable small bilateral pleural effusions. Mild pulmonary edema, decreased. Stable patchy bibasilar lung opacities. IMPRESSION: 1. Stable mild cardiomegaly and decreased mild pulmonary edema, suggesting improving but persistent mild congestive heart failure. 2. Stable small bilateral pleural effusions and patchy bibasilar lung opacities favoring atelectasis. Electronically Signed   By: Ilona Sorrel M.D.   On: 09/13/2015 18:53   Dg Chest Port  1 View  09/11/2015  CLINICAL DATA:  Acute respiratory failure. EXAM: PORTABLE CHEST 1 VIEW COMPARISON:  09/10/2015.  09/09/2015. FINDINGS: Right IJ line in stable position. Cardiomegaly with diffuse bilateral pulmonary interstitial prominence and bilateral pleural effusions consistent congestive heart failure. No pneumothorax. IMPRESSION: 1. Right IJ line stable position. 2. Cardiomegaly with diffuse bilateral pulmonary interstitial prominence and bilateral pleural effusions consistent with congestive heart failure. Similar findings on prior exam. Electronically Signed   By: [place]   On: 09/11/2015 07:30   Dg Chest Port 1 View  09/10/2015  CLINICAL DATA: MI. EXAM: PORTABLE CHEST 1 VIEW COMPARISON:  09/09/2015. FINDINGS: Endotracheal tube, NG tube, right IJ line stable position. Heart size normal. Bilateral pulmonary infiltrates, right side greater than left are noted. These findings are consistent with pulmonary edema and/or pneumonia. Small bilateral pleural effusions are noted. No pneumothorax . Severe degenerative changes both shoulders. IMPRESSION: 1. Lines and tubes in stable position. 2. Bilateral pulmonary infiltrates, right side greater than left. These findings are consistent with pulmonary edema and/or pneumonia. Small bilateral pleural effusions are also noted. Electronically Signed   By: Marcello Moores  Register   On: 09/10/2015 07:14   Dg Chest Port 1 View  09/09/2015  CLINICAL DATA:  82 year old female with endotracheal tube in place. Weakness. Hypertension. Myocardial infarction. Subsequent encounter. EXAM: PORTABLE CHEST 1 VIEW COMPARISON:  09/08/2015. FINDINGS: Endotracheal tube tip is 2.8 cm above the carina. Right central line tip distal superior vena cava level. Nasogastric tube courses below the diaphragm. Tip is not included on the present exam. Pulmonary vascular congestion/ mild pulmonary edema possibly with posteriorly layering pleural fluid. Persistent consolidation retrocardiac  region may represent atelectasis, infiltrate or mass. Appearance unchanged. Prominence of right hilar region may represent pulmonary vasculature. Attention to this on follow-up. Calcified aorta. Prominent right shoulder joint degenerative changes. No gross pneumothorax. IMPRESSION: Pulmonary vascular congestion/ pulmonary edema. Consolidation retrocardiac region. Appearance unchanged. Prominence right hilar region may represent pulmonary artery. Attention to this on follow-up. Electronically Signed   By: Genia Del M.D.   On: 09/09/2015 07:08   Dg Chest Port 1 View  09/08/2015  CLINICAL DATA:  Endotracheal tube placement.  Initial encounter. EXAM: PORTABLE CHEST 1 VIEW COMPARISON:  Chest radiograph performed earlier today at 6:35 p.m. FINDINGS: The patient's endotracheal tube is seen ending 2-3 cm above the carina. An enteric tube is noted extending below the diaphragm. The right IJ line is noted ending about the distal SVC. Vascular congestion is noted. Right apical and left basilar airspace opacities may reflect pulmonary edema or possibly pneumonia, somewhat improved from the prior study. Small bilateral pleural effusions are noted. No pneumothorax is seen. The cardiomediastinal silhouette is borderline normal in size. No acute osseous abnormalities are seen. There is chronic bony remodeling at the right glenohumeral joint. IMPRESSION: 1. Endotracheal tube seen ending 2-3 cm above the carina. 2. Vascular congestion noted. Right apical and left basilar airspace opacities may reflect pulmonary edema or possibly pneumonia, somewhat improved from the prior study. Small bilateral pleural effusions noted. 3. Chronic bony remodeling at the right glenohumeral joint. Electronically Signed   By: Garald Balding M.D.   On: 09/08/2015 21:11   Dg Chest Port 1 View  09/08/2015  CLINICAL DATA:  Endotracheal tube placement. Central line placement and nasogastric tube placement. Initial encounter. EXAM: PORTABLE CHEST 1  VIEW COMPARISON:  Chest radiograph performed 09/06/2015 FINDINGS: The patient's endotracheal tube is seen ending just above the carina. This could be retracted 2-3 cm. The enteric tube is seen extending below the diaphragm. The right IJ line is noted ending about the distal SVC. Vascular congestion is noted, with central airspace opacities, concerning for pulmonary edema. Small bilateral pleural effusions are suspected. No pneumothorax is seen. The cardiomediastinal silhouette is mildly enlarged. No acute osseous abnormalities are identified. IMPRESSION: 1. Endotracheal tube seen ending just above the carina. This could be retracted 2-3 cm. 2. Enteric tube noted extending below the diaphragm. 3. Right IJ line noted ending about the distal SVC. 4. Vascular congestion and mild cardiomegaly, with central airspace opacities, concerning for pulmonary edema. Suspect small bilateral pleural effusions. These results were called by telephone at the time of interpretation on 09/08/2015 at 6:53 pm to Nursing on [facility], who verbally acknowledged these results. Electronically Signed   By: Garald Balding M.D.   On: 09/08/2015 18:54   Dg Abd Acute W/chest  09/06/2015  CLINICAL DATA:  Nausea, vomiting, diarrhea. EXAM: DG ABDOMEN ACUTE W/ 1V CHEST COMPARISON:  Chest radiograph of August 24, 2013. FINDINGS: There is no evidence of dilated bowel loops or free intraperitoneal air. No radiopaque calculi or other significant radiographic abnormality is seen. Heart size and mediastinal contours are within normal limits. Both lungs are clear.  IMPRESSION: No evidence of bowel obstruction or ileus. No acute cardiopulmonary disease. Electronically Signed   By: Marijo Conception, M.D.   On: 09/06/2015 15:17   Dg Hip Unilat With Pelvis 2-3 Views Right  09/16/2015  CLINICAL DATA:  Right hip pain after fall. EXAM: DG HIP (WITH OR WITHOUT PELVIS) 2-3V RIGHT COMPARISON:  None. FINDINGS: Status post right total hip arthroplasty. No fracture or  dislocation is noted. The prosthesis appears to be well situated. IMPRESSION: No acute abnormality seen in the right hip. Electronically Signed   By: Marijo Conception, M.D.   On: 09/16/2015 09:57   US Abdomen Limited Ruq  09/06/2015  CLINICAL DATA:  Periumbilical pain for 2 hours EXAM: US ABDOMEN LIMITED - RIGHT UPPER QUADRANT COMPARISON:  CT scan 09/05/2015 FINDINGS: Gallbladder: No gallstones or wall thickening visualized. No sonographic Murphy sign noted by sonographer. Common bile duct: Diameter: 4 mm in diameter within normal limits. Liver: No focal lesion identified. Within normal limits in parenchymal echogenicity. IMPRESSION: Normal right upper quadrant ultrasound Electronically Signed   By: Lahoma Crocker M.D.   On: 09/06/2015 14:39    Microbiology: Recent Results (from the past 240 hour(s))  Surgical pcr screen     Status: None   Collection Time: 09/08/15 12:16 PM  Result Value Ref Range Status   MRSA, PCR NEGATIVE NEGATIVE Final   Staphylococcus aureus NEGATIVE NEGATIVE Final    Comment:        The Xpert SA Assay (FDA approved for NASAL specimens in patients over 58 years of age), is one component of a comprehensive surveillance program.  Test performance has been validated by [facility] for patients greater than or equal to 21 year old. It is not intended to diagnose infection nor to guide or monitor treatment.   MRSA PCR Screening     Status: None   Collection Time: 09/08/15  5:03 PM  Result Value Ref Range Status   MRSA by PCR NEGATIVE NEGATIVE Final    Comment:        The GeneXpert MRSA Assay (FDA approved for NASAL specimens only), is one component of a comprehensive MRSA colonization surveillance program. It is not intended to diagnose MRSA infection nor to guide or monitor treatment for MRSA infections.   Urine culture     Status: Abnormal   Collection Time: 09/15/15 12:33 PM  Result Value Ref Range Status   Specimen Description URINE, RANDOM  Final    Special Requests ADDED [id number]  Final   Culture MULTIPLE SPECIES PRESENT, SUGGEST RECOLLECTION (A)  Final   Report Status 09/17/2015 FINAL  Final     Labs: Basic Metabolic Panel:  Recent Labs Lab 09/12/15 0337 09/13/15 0342 09/15/15 0526  NA 140 138  --   K 3.5 3.4*  --   CL 105 105  --   CO2 25 25  --   GLUCOSE 132* 115*  --   BUN 29* 17  --   CREATININE 1.06* 0.88 0.65  CALCIUM 8.6* 8.7*  --   MG 2.0  --   --   PHOS 2.4* 2.8  --    Liver Function Tests: No results for input(s): AST, ALT, ALKPHOS, BILITOT, PROT, ALBUMIN in the last 168 hours. No results for input(s): LIPASE, AMYLASE in the last 168 hours. No results for input(s): AMMONIA in the last 168 hours. CBC:  Recent Labs Lab 09/13/15 1910  WBC 7.8  HGB 9.2*  HCT 28.7*  MCV 93.8  PLT 149*  Cardiac Enzymes:  Recent Labs Lab 09/13/15 1240 09/13/15 1910 09/14/15 0014  TROPONINI 4.29* 4.27* 3.46*   BNP: BNP (last 3 results)  Recent Labs  09/16/15 1516  BNP 1214.1*    ProBNP (last 3 results) No results for input(s): PROBNP in the last 8760 hours.  CBG: No results for input(s): GLUCAP in the last 168 hours.     SignedCristal Ford  [facility] 09/18/2015, 10:38 AM

## 2015-09-18 NOTE — Progress Notes (Signed)
Tech offered Pt a bath, Pt responded to tech and RN that if they continued to bother her, she would shoot them. Pt stated all she wants is to just get some rest.

## 2015-09-18 NOTE — Care Management Note (Signed)
Case Management Note Marvetta Gibbons RN, BSN Unit 2W-Case Manager [phone]  Patient Details  Name: Teresa Franklin MRN: [id number] Date of Birth: 07-Nov-1930  Subjective/Objective:   Pt admitted with NSTEMI/CHB post cath-                  Action/Plan: PTA pt lived at home with spouse- daughter [name] is HCPOA- also has 2 sons that are involved- d/c planning for STSNF- CSW has been consulted for placement needs-   Expected Discharge Date:     09/18/15             Expected Discharge Plan:  Skilled Nursing Facility  In-House Referral:  Clinical Social Work  Discharge planning Services  CM Consult  Post Acute Care Choice:    Choice offered to:     DME Arranged:    DME Agency:     HH Arranged:    [place] Agency:     Status of Service:  Completed, signed off  If discussed at [employer] of Stay Meetings, dates discussed:    Discharge Disposition: skilled facility   Additional Comments:  09/16/15- Marvetta Gibbons RN, CM- plan for PC mtg to discuss [place] with pt and family- needs MOST form- high risk for readmit otherwise per MD- plan remains to d/c to SNF- [facility]- CSW following for placement once cleared for discharge.   [name], RN 09/18/2015, 11:14 AM

## 2015-09-18 NOTE — Care Management Important Message (Signed)
Important Message  Patient Details  Name: Teresa Franklin MRN: [id number] Date of Birth: October 28, 1930   Medicare Important Message Given:  Yes    Loann Quill 09/18/2015, 10:02 AM

## 2015-09-30 ENCOUNTER — Encounter (INDEPENDENT_AMBULATORY_CARE_PROVIDER_SITE_OTHER): Payer: Self-pay

## 2015-09-30 ENCOUNTER — Encounter: Payer: Self-pay | Admitting: Cardiovascular Disease

## 2015-09-30 ENCOUNTER — Ambulatory Visit (INDEPENDENT_AMBULATORY_CARE_PROVIDER_SITE_OTHER): Payer: Medicare Other | Admitting: Cardiovascular Disease

## 2015-09-30 VITALS — BP 120/78 | HR 94 | Ht 62.0 in | Wt 122.2 lb

## 2015-09-30 DIAGNOSIS — I214 Non-ST elevation (NSTEMI) myocardial infarction: Secondary | ICD-10-CM | POA: Diagnosis not present

## 2015-09-30 DIAGNOSIS — I5033 Acute on chronic diastolic (congestive) heart failure: Secondary | ICD-10-CM

## 2015-09-30 DIAGNOSIS — I10 Essential (primary) hypertension: Secondary | ICD-10-CM | POA: Diagnosis not present

## 2015-09-30 MED ORDER — FUROSEMIDE 20 MG PO TABS: 20.0000 mg | ORAL_TABLET | Freq: Every day | ORAL | Status: DC

## 2015-09-30 MED ORDER — ATORVASTATIN CALCIUM 20 MG PO TABS: 20.0000 mg | ORAL_TABLET | Freq: Every day | ORAL | Status: DC

## 2015-09-30 NOTE — Patient Instructions (Signed)
Medication Instructions:  Your physician recommends that you continue on your current medications as directed. Please refer to the Current Medication list given to you today.   Labwork: none  Testing/Procedures: none  Follow-Up: Your physician recommends that you schedule a follow-up appointment in: two months with Dr. Fletcher Anon.    Any Other Special Instructions Will Be Listed Below (If Applicable).     If you need a refill on your cardiac medications before your next appointment, please call your pharmacy.

## 2015-09-30 NOTE — Progress Notes (Signed)
Cardiology Office Note   Date:  09/30/2015   ID:  Teresa Franklin, DOB Apr 05, 1930, MRN [id number]  PCP:  [name], MD  Cardiologist:   [name], MD   Chief Complaint  Patient presents with  . other    follow up from [facility] s/p cardiac cath & stent placement.  Meds reviewed by the patient's med list. Pt. c/o shortness of breath.       History of Present Illness: Teresa Franklin is a 82 y.o. female who presents for A follow-up visit after recent hospitalization for non-ST elevation myocardial infarction. She initially presented on June 18 to [facility] with nausea and vomiting. She was found to have elevated troponin and the plan was to proceed with cardiac catheterization on Monday. However, she deteriorated over the weekend with hypotension and high-grade AV block.  She was transferred to [facility] where she underwent urgent cardiac catheterization which showed an occluded right posterior AV groove artery which was treated successfully with PCI and drug-eluting stent placement. She had temporary pacemaker placement and was intubated for respiratory failure and volume overload. There was significant disease affecting the proximal and mid LAD with initial plans for staged atherectomy and stent placement. However, ultimately medical therapy was recommended considering her frail status and involvement of a large diagonal branch at the site of stenosis. She also suffered from what seems to be a stroke on June 25 which was confirmed with an MRI. However, she had no residual neurologic deficits. She was discharged to rehabilitation and currently she feels significantly better. She reports feeling almost completely back to her normal self with resolution of nausea and abdominal pain. No chest pain or shortness of breath.   Past Medical History  Diagnosis Date  . Hypertension   . Diverticulitis   . Osteopenia   . Hypothyroidism   . Hepatic artery stenosis ([place])   . Sleep apnea   . Subdural  hemorrhage ([place])   . Weakness   . Generalized anxiety disorder   . Back pain   . HLD (hyperlipidemia)   . Hepatic steatosis   . OA (osteoarthritis)   . Myocardial infarction ([place])   . Anxiety   . Coronary artery disease     Past Surgical History  Procedure Laterality Date  . Cardiac catheterization N/A 09/08/2015    Procedure: Temporary Pacemaker;  Surgeon: Leonie Man, MD;  Location: [place] CV LAB;  Service: Cardiovascular;  Laterality: N/A;  . Cardiac catheterization N/A 09/08/2015    Procedure: Left Heart Cath and Coronary Angiography;  Surgeon: Leonie Man, MD;  Location: [place] CV LAB;  Service: Cardiovascular;  Laterality: N/A;  . Cardiac catheterization N/A 09/08/2015    Procedure: Coronary Stent Intervention;  Surgeon: Leonie Man, MD;  Location: [place] CV LAB;  Service: Cardiovascular;  Laterality: N/A;  . Cardiac catheterization  09/08/2015    Procedure: Central Line Insertion;  Surgeon: Leonie Man, MD;  Location: [place] CV LAB;  Service: Cardiovascular;;  . Cardiac catheterization  09/08/2015    Procedure: Arterial Line Insertion;  Surgeon: Leonie Man, MD;  Location: [place] CV LAB;  Service: Cardiovascular;;  . Cardiac catheterization N/A 09/09/2015    Procedure: Temporary Wire;  Surgeon: Lorretta Harp, MD;  Location: [place] CV LAB;  Service: Cardiovascular;  Laterality: N/A;     Current Outpatient Prescriptions  Medication Sig Dispense Refill  . acetaminophen (TYLENOL) 325 MG tablet Take 650 mg by mouth every 6 (six)  hours as needed for mild pain, fever or headache.     Marland Kitchen aspirin 81 MG chewable tablet Chew 1 tablet (81 mg total) by mouth daily.    Marland Kitchen atorvastatin (LIPITOR) 20 MG tablet Take 20 mg by mouth daily.    . calcium-vitamin D (OSCAL WITH D) 500-200 MG-UNIT tablet Take 1 tablet by mouth 2 (two) times daily.    . clopidogrel (PLAVIX) 75 MG tablet Take 1 tablet (75 mg total) by mouth daily with breakfast. 30 tablet  11  . FOLBIC 2.5-25-2 MG TABS tablet Take 1 tablet by mouth daily.    . furosemide (LASIX) 20 MG tablet Take 20 mg by mouth.    . levothyroxine (SYNTHROID, LEVOTHROID) 25 MCG tablet Take 25 mcg by mouth daily before breakfast.     . Magnesium 250 MG TABS Take 250 mg by mouth daily.    . metoCLOPramide (REGLAN) 5 MG tablet Take 1 tablet (5 mg total) by mouth every 8 (eight) hours as needed for nausea. 15 tablet 0  . Multiple Vitamins-Minerals (MULTIVITAMIN GUMMIES ADULT) CHEW Chew 2 each by mouth at bedtime.    . Multiple Vitamins-Minerals (PRESERVISION AREDS 2) CAPS Take 1 capsule by mouth daily.    . nitroGLYCERIN (NITROSTAT) 0.4 MG SL tablet Place 1 tablet (0.4 mg total) under the tongue every 5 (five) minutes x 3 doses as needed for chest pain. 25 tablet 2  . omeprazole (PRILOSEC) 20 MG capsule Take 20 mg by mouth daily before breakfast.     . promethazine (PHENERGAN) 25 MG suppository Place 1 suppository (25 mg total) rectally every 6 (six) hours as needed for nausea. 12 suppository 0  . simethicone (GAS-X) 80 MG chewable tablet Chew 1 tablet (80 mg total) by mouth 4 (four) times daily as needed for flatulence. 20 tablet 0  . sodium chloride (OCEAN) 0.65 % SOLN nasal spray Place 1 spray into both nostrils as needed for congestion. 1 Bottle 0   No current facility-administered medications for this visit.    Allergies:   Codeine; Flomax; Norco; and Sulfa antibiotics    Social History:  The patient  reports that she has never smoked. She does not have any smokeless tobacco history on file. She reports that she does not drink alcohol or use illicit drugs.   Family History:  The patient's family history includes Bipolar disorder in her sister; Diabetes in her sister; Hyperlipidemia in her mother, sister, and sister; Hypertension in her mother and sister; Prostate cancer in her brother; Stroke in her mother and sister.    ROS:  Please see the history of present illness.   Otherwise, review of  systems are positive for none.   All other systems are reviewed and negative.    PHYSICAL EXAM: VS:  BP 120/78 mmHg  Pulse 94  Ht 5\' 2"  (1.575 m)  Wt 122 lb 4 oz (55.452 kg)  BMI 22.35 kg/m2 , BMI Body mass index is 22.35 kg/(m^2). GEN: Well nourished, well developed, in no acute distress HEENT: normal Neck: no JVD, carotid bruits, or masses Cardiac: RRR; no murmurs, rubs, or gallops,no edema  Respiratory:  clear to auscultation bilaterally, normal work of breathing GI: soft, nontender, nondistended, + BS MS: no deformity or atrophy Skin: warm and dry, no rash Neuro:  Strength and sensation are intact Psych: euthymic mood, full affect   EKG:  EKG is ordered today. The ekg ordered today demonstrates normal sinus rhythm with nonspecific ST or T wave changes.   Recent Labs: 09/10/2015:  ALT 31 09/12/2015: Magnesium 2.0 09/13/2015: BUN 17; Hemoglobin 9.2*; Platelets 149*; Potassium 3.4*; Sodium 138 09/15/2015: Creatinine, Ser 0.65 09/16/2015: B Natriuretic Peptide 1214.1*    Lipid Panel    Component Value Date/Time   CHOL 114 09/09/2015 0350   TRIG 111 09/09/2015 0350   HDL 38* 09/09/2015 0350   CHOLHDL 3.0 09/09/2015 0350   VLDL 22 09/09/2015 0350   LDLCALC 54 09/09/2015 0350      Wt Readings from Last 3 Encounters:  09/30/15 122 lb 4 oz (55.452 kg)  09/11/15 137 lb 5.6 oz (62.3 kg)  09/06/15 125 lb 12.8 oz (57.063 kg)       ASSESSMENT AND PLAN:  1.  Non-ST elevation myocardial infarction: The patient had recent non-ST elevation myocardial infarction complicated by high-grade AV block and cardiogenic shock. Echocardiogram subsequently showed normal LV systolic function. She had successful angioplasty and drug-eluting stent placement to the right posterior AV groove artery. She continues to have residual disease affecting the LAD. I reviewed angiography with her and her son today. Given the lack of anginal symptoms, I recommend continued medical therapy for now. The  disease in the LAD is diffuse and involves the origin of a large diagonal branch which is probably larger than the LAD territory itself. Thus, I recommend reserving PCI for refractory anginal symptoms. Currently she is feeling well. Continue dual antiplatelet therapy. No evidence of high-grade AV block on her EKG.  2. Acute diastolic heart failure: This was during her presentation with myocardial infarction and was likely triggered by acute ischemia and high-grade AV block. She appears to be euvolemic on small dose furosemide.  3. Hyperlipidemia: Continue treatment with atorvastatin. LDL was 54.    Disposition:   FU with me in 2 months  Signed,  [name], MD  09/30/2015 2:42 PM    [facility]

## 2015-10-18 DIAGNOSIS — I5020 Unspecified systolic (congestive) heart failure: Secondary | ICD-10-CM | POA: Insufficient documentation

## 2015-11-12 ENCOUNTER — Encounter: Payer: Self-pay | Admitting: Internal Medicine

## 2015-11-12 ENCOUNTER — Inpatient Hospital Stay: Payer: Medicare Other

## 2015-11-12 ENCOUNTER — Inpatient Hospital Stay: Payer: Medicare Other | Attending: Internal Medicine | Admitting: Internal Medicine

## 2015-11-12 ENCOUNTER — Other Ambulatory Visit: Payer: Self-pay | Admitting: Internal Medicine

## 2015-11-12 DIAGNOSIS — Z7982 Long term (current) use of aspirin: Secondary | ICD-10-CM

## 2015-11-12 DIAGNOSIS — D696 Thrombocytopenia, unspecified: Secondary | ICD-10-CM | POA: Diagnosis present

## 2015-11-12 DIAGNOSIS — I2510 Atherosclerotic heart disease of native coronary artery without angina pectoris: Secondary | ICD-10-CM | POA: Insufficient documentation

## 2015-11-12 DIAGNOSIS — F419 Anxiety disorder, unspecified: Secondary | ICD-10-CM | POA: Insufficient documentation

## 2015-11-12 DIAGNOSIS — I10 Essential (primary) hypertension: Secondary | ICD-10-CM | POA: Insufficient documentation

## 2015-11-12 DIAGNOSIS — Z7902 Long term (current) use of antithrombotics/antiplatelets: Secondary | ICD-10-CM | POA: Insufficient documentation

## 2015-11-12 DIAGNOSIS — Z955 Presence of coronary angioplasty implant and graft: Secondary | ICD-10-CM | POA: Diagnosis not present

## 2015-11-12 DIAGNOSIS — E785 Hyperlipidemia, unspecified: Secondary | ICD-10-CM | POA: Insufficient documentation

## 2015-11-12 DIAGNOSIS — M1990 Unspecified osteoarthritis, unspecified site: Secondary | ICD-10-CM | POA: Diagnosis not present

## 2015-11-12 DIAGNOSIS — E039 Hypothyroidism, unspecified: Secondary | ICD-10-CM | POA: Diagnosis not present

## 2015-11-12 DIAGNOSIS — I252 Old myocardial infarction: Secondary | ICD-10-CM | POA: Diagnosis not present

## 2015-11-12 DIAGNOSIS — K760 Fatty (change of) liver, not elsewhere classified: Secondary | ICD-10-CM | POA: Diagnosis not present

## 2015-11-12 DIAGNOSIS — E876 Hypokalemia: Secondary | ICD-10-CM | POA: Insufficient documentation

## 2015-11-12 DIAGNOSIS — Z79899 Other long term (current) drug therapy: Secondary | ICD-10-CM | POA: Insufficient documentation

## 2015-11-12 DIAGNOSIS — I509 Heart failure, unspecified: Secondary | ICD-10-CM | POA: Insufficient documentation

## 2015-11-12 LAB — CBC WITH DIFFERENTIAL/PLATELET
Basophils Absolute: 0 10*3/uL (ref 0–0.1)
Eosinophils Absolute: 0.1 10*3/uL (ref 0–0.7)
Eosinophils Relative: 3 %
HEMATOCRIT: 40.5 % (ref 35.0–47.0)
HEMOGLOBIN: 13.7 g/dL (ref 12.0–16.0)
Lymphs Abs: 1 10*3/uL (ref 1.0–3.6)
MCH: 30.8 pg (ref 26.0–34.0)
MCHC: 33.8 g/dL (ref 32.0–36.0)
MCV: 91.3 fL (ref 80.0–100.0)
Monocytes Absolute: 0.3 10*3/uL (ref 0.2–0.9)
NEUTROS ABS: 2.7 10*3/uL (ref 1.4–6.5)
Platelets: 98 10*3/uL — ABNORMAL LOW (ref 150–440)
RBC: 4.44 MIL/uL (ref 3.80–5.20)
RDW: 14.4 % (ref 11.5–14.5)
WBC: 4.1 10*3/uL (ref 3.6–11.0)

## 2015-11-12 LAB — COMPREHENSIVE METABOLIC PANEL
ALBUMIN: 4.2 g/dL (ref 3.5–5.0)
ALK PHOS: 64 U/L (ref 38–126)
ALT: 20 U/L (ref 14–54)
ANION GAP: 9 (ref 5–15)
AST: 27 U/L (ref 15–41)
BILIRUBIN TOTAL: 0.6 mg/dL (ref 0.3–1.2)
BUN: 14 mg/dL (ref 6–20)
CALCIUM: 9.4 mg/dL (ref 8.9–10.3)
CO2: 26 mmol/L (ref 22–32)
Chloride: 104 mmol/L (ref 101–111)
Creatinine, Ser: 0.83 mg/dL (ref 0.44–1.00)
GFR calc Af Amer: >60 mL/min (ref >60)
GFR calc non Af Amer: >60 mL/min (ref >60)
GLUCOSE: 122 mg/dL — AB (ref 65–99)
Potassium: 3.5 mmol/L (ref 3.5–5.1)
Sodium: 139 mmol/L (ref 135–145)
TOTAL PROTEIN: 7.1 g/dL (ref 6.5–8.1)

## 2015-11-12 LAB — LACTATE DEHYDROGENASE: LDH: 152 U/L (ref 98–192)

## 2015-11-12 NOTE — Progress Notes (Signed)
[facility] CONSULT NOTE  Patient Care Team: [name], MD as PCP - General (Family Medicine)  CHIEF COMPLAINTS/PURPOSE OF CONSULTATION:   # THROMBOCYTOPENIA >100  # July 2017 CAD s/p Stenting & Stroke   # ? GIB [colonoscopy- divertciulosis [facility] 2012]   No history exists.     HISTORY OF PRESENTING ILLNESS:  Teresa Franklin 82 y.o.  female history of recent diagnosis of CAD status post stenting/also complicated by cardiac shock/respiratory failure/ also stroke- been referred to [place] for further evaluation and recommendations for thrombocytopenia.  Patient noted to have platelets are 102; however review of labs- showed patient had intermittent thrombocytopenia in the past. A CT scan of the abdomen and pelvis did not show any splenomegaly in June 2017.  Patient denies any alcohol. She denies any easy bruising or bleeding. Does not complain of any active GI bleed. No nosebleeds.   ROS: A complete 10 point review of system is done which is negative except mentioned above in history of present illness  MEDICAL HISTORY:  Past Medical History:  Diagnosis Date  . Anxiety   . AV block   . Back pain   . CHF (congestive heart failure) ([place])   . Coronary artery disease   . Diverticulitis   . Generalized anxiety disorder   . Hepatic artery stenosis ([place])   . Hepatic steatosis   . HLD (hyperlipidemia)   . Hypertension   . Hypokalemia   . Hypomagnesemia   . Hypothyroidism   . Myocardial infarction ([place])   . OA (osteoarthritis)   . Osteopenia   . Sleep apnea   . Subdural hemorrhage ([place])   . Thrombocytopenia ([place])   . Weakness     SURGICAL HISTORY: Past Surgical History:  Procedure Laterality Date  . CARDIAC CATHETERIZATION N/A 09/08/2015   Procedure: Temporary Pacemaker;  Surgeon: Leonie Man, MD;  Location: [facility];  Service: Cardiovascular;  Laterality: N/A;  . CARDIAC CATHETERIZATION N/A 09/08/2015   Procedure: Left Heart Cath and Coronary  Angiography;  Surgeon: Leonie Man, MD;  Location: [facility];  Service: Cardiovascular;  Laterality: N/A;  . CARDIAC CATHETERIZATION N/A 09/08/2015   Procedure: Coronary Stent Intervention;  Surgeon: Leonie Man, MD;  Location: [facility];  Service: Cardiovascular;  Laterality: N/A;  . CARDIAC CATHETERIZATION  09/08/2015   Procedure: Central Line Insertion;  Surgeon: Leonie Man, MD;  Location: [facility];  Service: Cardiovascular;;  . CARDIAC CATHETERIZATION  09/08/2015   Procedure: Arterial Line Insertion;  Surgeon: Leonie Man, MD;  Location: [facility];  Service: Cardiovascular;;  . CARDIAC CATHETERIZATION N/A 09/09/2015   Procedure: Temporary Wire;  Surgeon: Lorretta Harp, MD;  Location: [facility];  Service: Cardiovascular;  Laterality: N/A;    SOCIAL HISTORY: worked until until Tax adviser- retd; no smoking/alcohol/ lives with husband in [place].  Social History   Social History  . Marital status: Married    Spouse name: N/A  . Number of children: N/A  . Years of education: N/A   Occupational History  . Not on file.   Social History Main Topics  . Smoking status: Never Smoker  . Smokeless tobacco: Never Used  . Alcohol use No  . Drug use: No  . Sexual activity: No   Other Topics Concern  . Not on file   Social History Narrative  . No narrative on file    FAMILY HISTORY: Family History  Problem  Relation Age of Onset  . Hypertension Mother   . Hyperlipidemia Mother   . Stroke Mother   . Diabetes Sister   . Hypertension Sister   . Hyperlipidemia Sister   . Bipolar disorder Sister   . Hyperlipidemia Sister   . Stroke Sister   . Prostate cancer Brother     ALLERGIES:  is allergic to codeine; flomax [tamsulosin]; norco [hydrocodone-acetaminophen]; and sulfa antibiotics.  MEDICATIONS:  Current Outpatient Prescriptions  Medication Sig Dispense Refill  . acetaminophen (TYLENOL) 325 MG tablet Take 650 mg  by mouth every 6 (six) hours as needed for mild pain, fever or headache.     . ALPRAZolam (XANAX) 0.25 MG tablet Take 0.25 mg by mouth at bedtime as needed for anxiety.    Marland Kitchen aspirin 81 MG chewable tablet Chew 1 tablet (81 mg total) by mouth daily.    Marland Kitchen atorvastatin (LIPITOR) 20 MG tablet Take 1 tablet (20 mg total) by mouth daily. 30 tablet 5  . calcium-vitamin D (OSCAL WITH D) 500-200 MG-UNIT tablet Take 1 tablet by mouth 2 (two) times daily.    . carvedilol (COREG) 3.125 MG tablet     . clopidogrel (PLAVIX) 75 MG tablet Take 1 tablet (75 mg total) by mouth daily with breakfast. 30 tablet 11  . fluocinonide (LIDEX) 0.05 % external solution     . FOLBIC 2.5-25-2 MG TABS tablet Take 1 tablet by mouth daily.    . furosemide (LASIX) 20 MG tablet Take 1 tablet (20 mg total) by mouth daily. 30 tablet 3  . KLOR-CON M20 20 MEQ tablet     . levothyroxine (SYNTHROID, LEVOTHROID) 25 MCG tablet Take 25 mcg by mouth daily before breakfast.     . Magnesium 250 MG TABS Take 250 mg by mouth daily.    . metoCLOPramide (REGLAN) 5 MG tablet Take 1 tablet (5 mg total) by mouth every 8 (eight) hours as needed for nausea. 15 tablet 0  . Multiple Vitamins-Minerals (MULTIVITAMIN GUMMIES ADULT) CHEW Chew 2 each by mouth at bedtime.    . Multiple Vitamins-Minerals (PRESERVISION AREDS 2) CAPS Take 1 capsule by mouth daily.    Marland Kitchen omeprazole (PRILOSEC) 20 MG capsule Take 20 mg by mouth daily before breakfast.     . promethazine (PHENERGAN) 25 MG suppository Place 1 suppository (25 mg total) rectally every 6 (six) hours as needed for nausea. 12 suppository 0  . simethicone (GAS-X) 80 MG chewable tablet Chew 1 tablet (80 mg total) by mouth 4 (four) times daily as needed for flatulence. 20 tablet 0  . nitroGLYCERIN (NITROSTAT) 0.4 MG SL tablet Place 1 tablet (0.4 mg total) under the tongue every 5 (five) minutes x 3 doses as needed for chest pain. (Patient not taking: Reported on 11/12/2015) 25 tablet 2  . sodium chloride  (OCEAN) 0.65 % SOLN nasal spray Place 1 spray into both nostrils as needed for congestion. 1 Bottle 0   No current facility-administered medications for this visit.       Marland Kitchen  PHYSICAL EXAMINATION:   Vitals:   11/12/15 1352  BP: 127/76  Pulse: 73  Resp: 15  Temp: 97.2 F (36.2 C)   Filed Weights   11/12/15 1352  Weight: 122 lb 11 oz (55.7 kg)    GENERAL: Well-nourished well-developed; Alert, no distress and comfortable.   Alone.  EYES: no pallor or icterus OROPHARYNX: no thrush or ulceration; good dentition  NECK: supple, no masses felt LYMPH:  no palpable lymphadenopathy in the cervical, axillary or inguinal  regions LUNGS: clear to auscultation and  No wheeze or crackles HEART/CVS: regular rate & rhythm and no murmurs; No lower extremity edema ABDOMEN: abdomen soft, non-tender and normal bowel sounds Musculoskeletal:no cyanosis of digits and no clubbing  PSYCH: alert & oriented x 3 with fluent speech NEURO: no focal motor/sensory deficits SKIN:  no rashes or significant lesions  LABORATORY DATA:  I have reviewed the data as listed Lab Results  Component Value Date   WBC 4.1 11/12/2015   HGB 13.7 11/12/2015   HCT 40.5 11/12/2015   MCV 91.3 11/12/2015   PLT 98 (L) 11/12/2015    Recent Labs  09/09/15 0350 09/10/15 0357  09/12/15 0337 09/13/15 0342 09/15/15 0526 11/12/15 1454  NA 136 136  < > 140 138  --  139  K 3.2* 4.0  < > 3.5 3.4*  --  3.5  CL 108 110  < > 105 105  --  104  CO2 21* 21*  < > 25 25  --  26  GLUCOSE 151* 160*  < > 132* 115*  --  122*  BUN 13 25*  < > 29* 17  --  14  CREATININE 0.89 1.02*  < > 1.06* 0.88 0.65 0.83  CALCIUM 8.4* 8.3*  < > 8.6* 8.7*  --  9.4  GFRNONAA 59* 50*  < > 48* 60* >60 >60  GFRAA >60 58*  < > 55* >60 >60 >60  PROT 5.3* 5.0*  --   --   --   --  7.1  ALBUMIN 2.6* 2.2*  --   --   --   --  4.2  AST 80* 46*  --   --   --   --  27  ALT 46 31  --   --   --   --  20  ALKPHOS 79 71  --   --   --   --  64  BILITOT 1.0 0.9   --   --   --   --  0.6  < > = values in this interval not displayed.  RADIOGRAPHIC STUDIES: I have personally reviewed the radiological images as listed and agreed with the findings in the report. No results found.  ASSESSMENT & PLAN:   Thrombocytopenia ([place]) Unclear etiology Etiology ITP vs MDS; rule out other causes like hepatitis/myeloma. CT negative for splenomegaly.  # Most recent platelets above 100,000/asymptomatic [patient on Plavix aspirin]; monitor closely for now. If Worse recommend bone marrow biopsy.  # CAD status post stenting on aspirin and Plavix  # Recommend CBC in 6 weeks/follow-up with me in 3 months with labs.   Thank you Dr. Hoy Morn for allowing me to participate in the care of your pleasant patient. Please do not hesitate to contact me with questions or concerns in the interim.  All questions were answered. The patient knows to call the clinic with any problems, questions or concerns.  # 45 minutes face-to-face with the patient discussing the above plan of care; more than 50% of time spent on prognosis/ natural history; counseling and coordination.     Cammie Sickle, MD 11/12/2015 4:03 PM

## 2015-11-12 NOTE — Assessment & Plan Note (Addendum)
Unclear etiology Etiology ITP vs MDS; rule out other causes like hepatitis/myeloma. CT negative for splenomegaly.  # Most recent platelets above 100,000/asymptomatic [patient on Plavix aspirin]; monitor closely for now. If Worse recommend bone marrow biopsy.  # CAD status post stenting on aspirin and Plavix  # Recommend CBC in 6 weeks/follow-up with me in 3 months with labs.   Thank you Dr. Hoy Morn for allowing me to participate in the care of your pleasant patient. Please do not hesitate to contact me with questions or concerns in the interim.

## 2015-11-12 NOTE — Progress Notes (Signed)
Pt reports recent Heart attack, stroke and weakness, Hx of bleeding and a concussion in 2015 that's leads to having some memory issues that are baseline now.

## 2015-11-13 ENCOUNTER — Telehealth: Payer: Self-pay | Admitting: *Deleted

## 2015-11-13 DIAGNOSIS — D693 Immune thrombocytopenic purpura: Secondary | ICD-10-CM

## 2015-11-13 LAB — HEPATITIS C ANTIBODY: HCV Ab: <0.1 s/co ratio (ref 0.0–0.9)

## 2015-11-13 LAB — VITAMIN B12: VITAMIN B 12: 2554 pg/mL — AB (ref 180–914)

## 2015-11-13 LAB — IMMUNOFIXATION ELECTROPHORESIS
IGA: 114 mg/dL (ref 64–422)
IGG (IMMUNOGLOBIN G), SERUM: 778 mg/dL (ref 700–1600)
IgM, Serum: 142 mg/dL (ref 26–217)
TOTAL PROTEIN ELP: 6.3 g/dL (ref 6.0–8.5)

## 2015-11-13 LAB — HEPATITIS B CORE ANTIBODY, IGM: HEP B C IGM: NEGATIVE

## 2015-11-13 LAB — HIV ANTIBODY (ROUTINE TESTING W REFLEX): HIV Screen 4th Generation wRfx: NONREACTIVE

## 2015-11-13 LAB — HEPATITIS B SURFACE ANTIGEN: HEP B S AG: NEGATIVE

## 2015-11-13 NOTE — Telephone Encounter (Signed)
Called patient and provided lab results.  She gave verbal understanding of the plan.  I asked scheduling to contact patient with these new additional lab apts.  -- lab only in 3 weeks and lab only in 6 weeks  sch. in [place].

## 2015-11-13 NOTE — Telephone Encounter (Signed)
-----   Message from Cammie Sickle, MD sent at 11/12/2015  5:29 PM EDT ----- Please inform patient that her platelets are low at 98/slightly lower from previous labs. Recommend checking CBC in 3 weeks/in addition to labs ordered in 6 weeks.[ However if she has bleeding issues to let us know; I will give a trial of steroids for possible ITP]. Thx

## 2015-11-26 ENCOUNTER — Ambulatory Visit
Admission: RE | Admit: 2015-11-26 | Discharge: 2015-11-26 | Disposition: A | Discharge status: Home / Self Care | Payer: Medicare Other | Source: Ambulatory Visit | Attending: Family Medicine | Admitting: Family Medicine

## 2015-11-26 ENCOUNTER — Other Ambulatory Visit: Payer: Self-pay | Admitting: Family Medicine

## 2015-11-26 DIAGNOSIS — Z1231 Encounter for screening mammogram for malignant neoplasm of breast: Secondary | ICD-10-CM | POA: Insufficient documentation

## 2015-12-02 ENCOUNTER — Ambulatory Visit (INDEPENDENT_AMBULATORY_CARE_PROVIDER_SITE_OTHER): Payer: Medicare Other | Admitting: Cardiovascular Disease

## 2015-12-02 ENCOUNTER — Encounter: Payer: Self-pay | Admitting: Cardiovascular Disease

## 2015-12-02 VITALS — BP 132/82 | HR 80 | Ht 62.5 in | Wt 122.5 lb

## 2015-12-02 DIAGNOSIS — E785 Hyperlipidemia, unspecified: Secondary | ICD-10-CM

## 2015-12-02 DIAGNOSIS — I2510 Atherosclerotic heart disease of native coronary artery without angina pectoris: Secondary | ICD-10-CM

## 2015-12-02 DIAGNOSIS — I5032 Chronic diastolic (congestive) heart failure: Secondary | ICD-10-CM | POA: Diagnosis not present

## 2015-12-02 NOTE — Progress Notes (Signed)
Cardiology Office Note   Date:  12/02/2015   ID:  Teresa Franklin, DOB 10-26-1930, MRN [id number]  PCP:  [name], MD  Cardiologist:   [name], MD   Chief Complaint  Patient presents with  . Other    2 month follow up. Meds reviewed by the patient verbally. Pt. c/o rapid heart beats at times.       History of Present Illness: Teresa Franklin is a 82 y.o. female who presents for a follow-up visit regarding coronary artery disease. She presented in June 2017 with non-ST elevation myocardial infarction which was complicated by hypotension and high-grade AV block. She was transferred to [facility] where she underwent urgent cardiac catheterization which showed an occluded right posterior AV groove artery which was treated successfully with PCI and drug-eluting stent placement. She did not require a permanent pacemaker. Ejection fraction was normal. There was significant disease affecting the proximal and mid LAD. Medical therapy was recommended considering her frail status and involvement of a large diagonal branch at the site of stenosis. She also suffered from what seems to be a stroke on June 25 which was confirmed with an MRI. However, she had no residual neurologic deficits. She has been doing reasonably well with no recurrent anginal symptoms. No shortness of breath or chest pain. She continues to be weak. Symptoms of myocardial infarction manifested by nausea and vomiting.   Past Medical History:  Diagnosis Date  . Anxiety   . AV block   . Back pain   . CHF (congestive heart failure) ([place])   . Coronary artery disease   . Diverticulitis   . Generalized anxiety disorder   . Hepatic artery stenosis ([place])   . Hepatic steatosis   . HLD (hyperlipidemia)   . Hypertension   . Hypokalemia   . Hypomagnesemia   . Hypothyroidism   . Myocardial infarction ([place])   . OA (osteoarthritis)   . Osteopenia   . Sleep apnea   . Subdural hemorrhage ([place])   . Thrombocytopenia ([place])     . Weakness     Past Surgical History:  Procedure Laterality Date  . CARDIAC CATHETERIZATION N/A 09/08/2015   Procedure: Temporary Pacemaker;  Surgeon: Leonie Man, MD;  Location: [facility];  Service: Cardiovascular;  Laterality: N/A;  . CARDIAC CATHETERIZATION N/A 09/08/2015   Procedure: Left Heart Cath and Coronary Angiography;  Surgeon: Leonie Man, MD;  Location: [facility];  Service: Cardiovascular;  Laterality: N/A;  . CARDIAC CATHETERIZATION N/A 09/08/2015   Procedure: Coronary Stent Intervention;  Surgeon: Leonie Man, MD;  Location: [facility];  Service: Cardiovascular;  Laterality: N/A;  . CARDIAC CATHETERIZATION  09/08/2015   Procedure: Central Line Insertion;  Surgeon: Leonie Man, MD;  Location: [facility];  Service: Cardiovascular;;  . CARDIAC CATHETERIZATION  09/08/2015   Procedure: Arterial Line Insertion;  Surgeon: Leonie Man, MD;  Location: [facility];  Service: Cardiovascular;;  . CARDIAC CATHETERIZATION N/A 09/09/2015   Procedure: Temporary Wire;  Surgeon: Lorretta Harp, MD;  Location: [facility];  Service: Cardiovascular;  Laterality: N/A;     Current Outpatient Prescriptions  Medication Sig Dispense Refill  . acetaminophen (TYLENOL) 325 MG tablet Take 650 mg by mouth every 6 (six) hours as needed for mild pain, fever or headache.     . ALPRAZolam (XANAX) 0.25 MG tablet Take 0.25 mg by mouth at bedtime as needed for anxiety.    Marland Kitchen  aspirin 81 MG chewable tablet Chew 1 tablet (81 mg total) by mouth daily.    Marland Kitchen atorvastatin (LIPITOR) 20 MG tablet Take 1 tablet (20 mg total) by mouth daily. 30 tablet 5  . calcium-vitamin D (OSCAL WITH D) 500-200 MG-UNIT tablet Take 1 tablet by mouth 2 (two) times daily.    . carvedilol (COREG) 3.125 MG tablet     . clopidogrel (PLAVIX) 75 MG tablet Take 1 tablet (75 mg total) by mouth daily with breakfast. 30 tablet 11  . fluocinonide (LIDEX) 0.05 % external solution      . FOLBIC 2.5-25-2 MG TABS tablet Take 1 tablet by mouth daily.    . furosemide (LASIX) 20 MG tablet Take 1 tablet (20 mg total) by mouth daily. 30 tablet 3  . KLOR-CON M20 20 MEQ tablet     . levothyroxine (SYNTHROID, LEVOTHROID) 25 MCG tablet Take 25 mcg by mouth daily before breakfast.     . Magnesium 250 MG TABS Take 250 mg by mouth daily.    . Multiple Vitamins-Minerals (MULTIVITAMIN GUMMIES ADULT) CHEW Chew 2 each by mouth at bedtime.    . Multiple Vitamins-Minerals (PRESERVISION AREDS 2) CAPS Take 1 capsule by mouth daily.    . nitroGLYCERIN (NITROSTAT) 0.4 MG SL tablet Place 1 tablet (0.4 mg total) under the tongue every 5 (five) minutes x 3 doses as needed for chest pain. 25 tablet 2  . omeprazole (PRILOSEC) 20 MG capsule Take 20 mg by mouth daily before breakfast.     . simethicone (GAS-X) 80 MG chewable tablet Chew 1 tablet (80 mg total) by mouth 4 (four) times daily as needed for flatulence. 20 tablet 0  . sodium chloride (OCEAN) 0.65 % SOLN nasal spray Place 1 spray into both nostrils as needed for congestion. 1 Bottle 0   No current facility-administered medications for this visit.     Allergies:   Codeine; Flomax [tamsulosin]; Norco [hydrocodone-acetaminophen]; and Sulfa antibiotics    Social History:  The patient  reports that she has never smoked. She has never used smokeless tobacco. She reports that she does not drink alcohol or use drugs.   Family History:  The patient's family history includes Bipolar disorder in her sister; Diabetes in her sister; Hyperlipidemia in her mother, sister, and sister; Hypertension in her mother and sister; Prostate cancer in her brother; Stroke in her mother and sister.    ROS:  Please see the history of present illness.   Otherwise, review of systems are positive for none.   All other systems are reviewed and negative.    PHYSICAL EXAM: VS:  BP 132/82 (BP Location: Left Arm, Patient Position: Sitting, Cuff Size: Normal)   Pulse 80   Ht  5' 2.5" (1.588 m)   Wt 122 lb 8 oz (55.6 kg)   BMI 22.05 kg/m  , BMI Body mass index is 22.05 kg/m. GEN: Well nourished, well developed, in no acute distress  HEENT: normal  Neck: no JVD, carotid bruits, or masses Cardiac: RRR; one out of 6 systolic ejection murmur in the aortic area, no rubs, or gallops,no edema  Respiratory:  clear to auscultation bilaterally, normal work of breathing GI: soft, nontender, nondistended, + BS MS: no deformity or atrophy  Skin: warm and dry, no rash Neuro:  Strength and sensation are intact Psych: euthymic mood, full affect   EKG:  EKG is ordered today. The ekg ordered today demonstrates normal sinus rhythm with nonspecific ST or T wave changes.   Recent Labs: 09/12/2015:  Magnesium 2.0 09/16/2015: B Natriuretic Peptide 1,214.1 11/12/2015: ALT 20; BUN 14; Creatinine, Ser 0.83; Hemoglobin 13.7; Platelets 98; Potassium 3.5; Sodium 139    Lipid Panel    Component Value Date/Time   CHOL 114 09/09/2015 0350   TRIG 111 09/09/2015 0350   HDL 38 (L) 09/09/2015 0350   CHOLHDL 3.0 09/09/2015 0350   VLDL 22 09/09/2015 0350   LDLCALC 54 09/09/2015 0350      Wt Readings from Last 3 Encounters:  12/02/15 122 lb 8 oz (55.6 kg)  11/12/15 122 lb 11 oz (55.7 kg)  09/30/15 122 lb 4 oz (55.5 kg)       ASSESSMENT AND PLAN:  1.  Coronary artery disease involving native coronary arteries without angina: She is doing reasonably well overall. Recommend continuing medical therapy. She does have residual LAD disease but has no symptoms suggestive of angina.  I advised her to try to increase her physical activities. She does appear to be somewhat physically deconditioned.  2. Chronic diastolic heart failure:  Volume overload was in the setting of myocardial infarction. She appears to be stable on small dose furosemide. I might consider stopping this in the future.   3. Hyperlipidemia: Continue treatment with atorvastatin. LDL was 54 in June..    Disposition:    FU with me in 6 months  Signed,  [name], MD  12/02/2015 1:52 PM    [facility]

## 2015-12-02 NOTE — Patient Instructions (Signed)
Medication Instructions: Continue same medications.   Labwork: None.   Procedures/Testing: None.   Follow-Up: 6 months with Dr. Amey Hossain.   Any Additional Special Instructions Will Be Listed Below (If Applicable).     If you need a refill on your cardiac medications before your next appointment, please call your pharmacy.   

## 2015-12-03 ENCOUNTER — Inpatient Hospital Stay: Payer: Medicare Other | Attending: Internal Medicine

## 2015-12-03 DIAGNOSIS — D696 Thrombocytopenia, unspecified: Secondary | ICD-10-CM | POA: Insufficient documentation

## 2015-12-03 DIAGNOSIS — D693 Immune thrombocytopenic purpura: Secondary | ICD-10-CM

## 2015-12-03 LAB — CBC WITH DIFFERENTIAL/PLATELET
BASOS ABS: 0.1 10*3/uL (ref 0–0.1)
BASOS PCT: 1 %
Band Neutrophils: 0 %
Blasts: 0 %
EOS PCT: 2 %
Eosinophils Absolute: 0.1 10*3/uL (ref 0–0.7)
HCT: 43.5 % (ref 35.0–47.0)
HEMOGLOBIN: 14.5 g/dL (ref 12.0–16.0)
LYMPHS ABS: 1.4 10*3/uL (ref 1.0–3.6)
Lymphocytes Relative: 25 %
MCH: 30.2 pg (ref 26.0–34.0)
MCHC: 33.4 g/dL (ref 32.0–36.0)
MCV: 90.7 fL (ref 80.0–100.0)
METAMYELOCYTES PCT: 0 %
MONO ABS: 0.4 10*3/uL (ref 0.2–0.9)
MONOS PCT: 8 %
MYELOCYTES: 0 %
NEUTROS ABS: 3.4 10*3/uL (ref 1.4–6.5)
NRBC: 0 /100{WBCs}
Neutrophils Relative %: 64 %
Other: 0 %
PLATELETS: 94 10*3/uL — AB (ref 150–440)
Promyelocytes Absolute: 0 %
RBC: 4.8 MIL/uL (ref 3.80–5.20)
RDW: 14 % (ref 11.5–14.5)
WBC: 5.4 10*3/uL (ref 3.6–11.0)

## 2015-12-04 ENCOUNTER — Telehealth: Payer: Self-pay | Admitting: *Deleted

## 2015-12-04 NOTE — Telephone Encounter (Signed)
-----   Message from Cammie Sickle, MD sent at 12/03/2015  5:44 PM EDT ----- Please inform patient platelets are stable/follow-up is planned

## 2015-12-04 NOTE — Telephone Encounter (Signed)
Patient informed of lab values. Recheck as scheduled in 1 month. Keep sch. Apt. Labs stable. Teach back process performed.

## 2015-12-24 ENCOUNTER — Inpatient Hospital Stay: Payer: Medicare Other | Attending: Internal Medicine

## 2015-12-24 DIAGNOSIS — D696 Thrombocytopenia, unspecified: Secondary | ICD-10-CM | POA: Diagnosis present

## 2015-12-24 DIAGNOSIS — D693 Immune thrombocytopenic purpura: Secondary | ICD-10-CM

## 2015-12-24 LAB — CBC WITH DIFFERENTIAL/PLATELET
BASOS PCT: 1 %
Basophils Absolute: 0 10*3/uL (ref 0–0.1)
EOS ABS: 0.2 10*3/uL (ref 0–0.7)
EOS PCT: 3 %
HCT: 41.9 % (ref 35.0–47.0)
HEMOGLOBIN: 14.2 g/dL (ref 12.0–16.0)
LYMPHS ABS: 1.4 10*3/uL (ref 1.0–3.6)
Lymphocytes Relative: 23 %
MCH: 29.9 pg (ref 26.0–34.0)
MCHC: 33.8 g/dL (ref 32.0–36.0)
MCV: 88.5 fL (ref 80.0–100.0)
MONOS PCT: 8 %
Monocytes Absolute: 0.5 10*3/uL (ref 0.2–0.9)
NEUTROS PCT: 65 %
Neutro Abs: 3.8 10*3/uL (ref 1.4–6.5)
PLATELETS: 98 10*3/uL — AB (ref 150–440)
RBC: 4.73 MIL/uL (ref 3.80–5.20)
RDW: 14.2 % (ref 11.5–14.5)
WBC: 5.8 10*3/uL (ref 3.6–11.0)

## 2015-12-30 ENCOUNTER — Telehealth: Payer: Self-pay | Admitting: *Deleted

## 2015-12-30 NOTE — Telephone Encounter (Signed)
-----   Message from Cammie Sickle, MD sent at 12/30/2015  7:55 AM EDT ----- Please inform pt- that labs are stable; continue monitoring as planned. Dr.B

## 2015-12-30 NOTE — Telephone Encounter (Signed)
Lab Results reviewed with patient- labs stable. Pt instructed Keep all sch. Lab apts.

## 2016-01-31 ENCOUNTER — Other Ambulatory Visit: Payer: Self-pay | Admitting: Cardiovascular Disease

## 2016-01-31 NOTE — Telephone Encounter (Signed)
Advised pt that Dr. Fletcher Anon would like for her to continue lasix as prescribed. She verbalized understanding and requests 90 day supply to [facility], [place]. Refill sent.

## 2016-01-31 NOTE — Telephone Encounter (Signed)
Pt would like to know if Dr. Fletcher Anon would like her to refill her Furosemide. Please advise.

## 2016-02-04 ENCOUNTER — Inpatient Hospital Stay: Payer: Medicare Other | Attending: Internal Medicine

## 2016-02-04 DIAGNOSIS — D696 Thrombocytopenia, unspecified: Secondary | ICD-10-CM

## 2016-02-04 LAB — CBC WITH DIFFERENTIAL/PLATELET
Basophils Absolute: 0 10*3/uL (ref 0–0.1)
Basophils Relative: 1 %
EOS ABS: 0.1 10*3/uL (ref 0–0.7)
EOS PCT: 2 %
HCT: 41 % (ref 35.0–47.0)
Hemoglobin: 13.9 g/dL (ref 12.0–16.0)
LYMPHS ABS: 1.2 10*3/uL (ref 1.0–3.6)
Lymphocytes Relative: 24 %
MCH: 30.1 pg (ref 26.0–34.0)
MCHC: 33.9 g/dL (ref 32.0–36.0)
MCV: 88.7 fL (ref 80.0–100.0)
MONO ABS: 0.4 10*3/uL (ref 0.2–0.9)
MONOS PCT: 7 %
Neutro Abs: 3.2 10*3/uL (ref 1.4–6.5)
Neutrophils Relative %: 66 %
PLATELETS: 86 10*3/uL — AB (ref 150–440)
RBC: 4.63 MIL/uL (ref 3.80–5.20)
RDW: 15 % — AB (ref 11.5–14.5)
WBC: 4.9 10*3/uL (ref 3.6–11.0)

## 2016-04-20 ENCOUNTER — Telehealth: Payer: Self-pay | Admitting: Cardiovascular Disease

## 2016-04-20 NOTE — Telephone Encounter (Addendum)
Pt reports she has "not been feeling normal' for the past week.  She feels exhausted with minimal activity and has to take a break after cooking breakfast. Sx improve with rest.  Pt exercises in the pool 2 x /week for 30 minutes and has been able to continue this activity even w/symptoms.  She is often awakened at night sweating which lasts 30 minutes.   Denies any other sx including chest pain, arm, back or jaw pain, dyspnea, nausea or vomiting. Denies fever or headache. Reviewed stroke signs to which she denies any.  No new medications added recently. Pt states sx are not similar to those associated with June 2017 NSTEMI.  June echo with EF 55-60%  Reviewed s/s that would require immediate attention in an ER setting to which patient verbalized understanding.  Advised pt to continue to monitor sx, proceed to ER if sx worsen and call PCP for sooner appt as she is scheduled for a physical mid-February. She will keep February 5 appt w/Chris Sharolyn Douglas, NP.   Pt verbalized understanding and is agreeable with plan. She had no further questions at this time.

## 2016-04-20 NOTE — Telephone Encounter (Signed)
Pt states for almost a week she has felt extremely exhausted. States she hasnt done anything to feel this way. States she is also getting hot flashes and sweating. Please call.

## 2016-04-20 NOTE — Telephone Encounter (Signed)
Pt did make an appt to see Ignacia Bayley, NP on 2/5

## 2016-04-27 ENCOUNTER — Ambulatory Visit (INDEPENDENT_AMBULATORY_CARE_PROVIDER_SITE_OTHER): Payer: Medicare Other | Admitting: Nurse Practitioner

## 2016-04-27 ENCOUNTER — Encounter: Payer: Self-pay | Admitting: Nurse Practitioner

## 2016-04-27 VITALS — BP 164/84 | HR 73 | Ht 62.0 in | Wt 121.2 lb

## 2016-04-27 DIAGNOSIS — I10 Essential (primary) hypertension: Secondary | ICD-10-CM | POA: Diagnosis not present

## 2016-04-27 DIAGNOSIS — I5043 Acute on chronic combined systolic (congestive) and diastolic (congestive) heart failure: Secondary | ICD-10-CM

## 2016-04-27 DIAGNOSIS — I25119 Atherosclerotic heart disease of native coronary artery with unspecified angina pectoris: Secondary | ICD-10-CM

## 2016-04-27 DIAGNOSIS — R0609 Other forms of dyspnea: Secondary | ICD-10-CM | POA: Diagnosis not present

## 2016-04-27 DIAGNOSIS — E782 Mixed hyperlipidemia: Secondary | ICD-10-CM | POA: Diagnosis not present

## 2016-04-27 NOTE — Patient Instructions (Addendum)
Medication Instructions:  Your physician recommends that you continue on your current medications as directed. Please refer to the Current Medication list given to you today.   Labwork: none  Testing/Procedures: Your physician has requested that you have a lexiscan myoview. For further information please visit [email or web address]. Please follow instruction sheet, as given.  [place]  Your caregiver has ordered a Stress Test with nuclear imaging. The purpose of this test is to evaluate the blood supply to your heart muscle. This procedure is referred to as a "Non-Invasive Stress Test." This is because other than having an IV started in your vein, nothing is inserted or "invades" your body. Cardiac stress tests are done to find areas of poor blood flow to the heart by determining the extent of coronary artery disease (CAD). Some patients exercise on a treadmill, which naturally increases the blood flow to your heart, while others who are  unable to walk on a treadmill due to physical limitations have a pharmacologic/chemical stress agent called Lexiscan . This medicine will mimic walking on a treadmill by temporarily increasing your coronary blood flow.   Please note: these test may take anywhere between 2-4 hours to complete  PLEASE REPORT TO [place] AT THE FIRST DESK WILL DIRECT YOU WHERE TO GO  Date of Procedure:__________02/21/18__________  Arrival Time for Procedure:_________07:45 am__________  Instructions regarding medication:   1-  Hold FUROSEMIDE medication morning of procedure   2-  Hold CARVEDILOL night before procedure and morning of procedure   PLEASE NOTIFY THE OFFICE AT LEAST 24 HOURS IN ADVANCE IF YOU ARE UNABLE TO KEEP YOUR APPOINTMENT.  [phone] AND  PLEASE NOTIFY NUCLEAR MEDICINE AT [facility] AT LEAST 24 HOURS IN ADVANCE IF YOU ARE UNABLE TO KEEP YOUR APPOINTMENT. [phone]  How to prepare for your Myoview  test:  1. Do not eat or drink after midnight 2. No caffeine for 24 hours prior to test 3. No smoking 24 hours prior to test. 4. Your medication may be taken with water.  If your doctor stopped a medication because of this test, do not take that medication. 5. Ladies, please do not wear dresses.  Skirts or pants are appropriate. Please wear a short sleeve shirt. 6. No perfume, cologne or lotion. 7. Wear comfortable walking shoes. No heels!      Follow-Up: Your physician recommends that you schedule a follow-up appointment in: 3 WEEKS WITH DR ARIDA OR AN EXTENDER.   Any Other Special Instructions Will Be Listed Below (If Applicable).  - Please take your blood pressure at home 1-2 times a day and call us with the readings.     Cardiac Nuclear Scanning A cardiac nuclear scan is used to check your heart for problems, such as the following:  A portion of the heart is not getting enough blood.  Part of the heart muscle has died, which happens with a heart attack.  The heart wall is not working normally.  In this test, a radioactive dye (tracer) is injected into your bloodstream. After the tracer has traveled to your heart, a scanning device is used to measure how much of the tracer is absorbed by or distributed to various areas of your heart. LET [facility] CARE PROVIDER KNOW ABOUT:  Any allergies you have.  All medicines you are taking, including vitamins, herbs, eye drops, creams, and over-the-counter medicines.  Previous problems you or members of your family have had with the use of anesthetics.  Any blood disorders  you have.  Previous surgeries you have had.  Medical conditions you have.  RISKS AND COMPLICATIONS Generally, this is a safe procedure. However, as with any procedure, problems can occur. Possible problems include:   Serious chest pain.  Rapid heartbeat.  Sensation of warmth in your chest. This usually passes quickly. BEFORE THE PROCEDURE Ask your  health care provider about changing or stopping your regular medicines. PROCEDURE This procedure is usually done at a hospital and takes 2-4 hours.  An IV tube is inserted into one of your veins.  Your health care provider will inject a small amount of radioactive tracer through the tube.  You will then wait for 20-40 minutes while the tracer travels through your bloodstream.  You will lie down on an exam table so images of your heart can be taken. Images will be taken for about 15-20 minutes.  You will exercise on a treadmill or stationary bike. While you exercise, your heart activity will be monitored with an electrocardiogram (ECG), and your blood pressure will be checked.  If you are unable to exercise, you may be given a medicine to make your heart beat faster.  When blood flow to your heart has peaked, tracer will again be injected through the IV tube.  After 20-40 minutes, you will get back on the exam table and have more images taken of your heart.  When the procedure is over, your IV tube will be removed. AFTER THE PROCEDURE  You will likely be able to leave shortly after the test. Unless your health care provider tells you otherwise, you may return to your normal schedule, including diet, activities, and medicines.  Make sure you find out how and when you will get your test results. This information is not intended to replace advice given to you by your health care provider. Make sure you discuss any questions you have with your health care provider. Document Released: 04/03/2004 Document Revised: 03/14/2013 Document Reviewed: 02/15/2013 Elsevier Interactive Patient Education  2017 [employer].   If you need a refill on your cardiac medications before your next appointment, please call your pharmacy.

## 2016-04-27 NOTE — Progress Notes (Signed)
Office Visit    Patient Name: Teresa Franklin Date of Encounter: 04/27/2016  Primary Care Provider:  [name], MD Primary Cardiologist:  Jerilynn Mages. Fletcher Anon, MD   Chief Complaint    83 y/o ? with a h/o CAD s/p inferior MI, stroke, HTN, HL, thrombocytopenia, and diast chf, who presents for f/u related to DOE and fatigue.  Past Medical History    Past Medical History:  Diagnosis Date  . Anxiety   . AV block    a. 08/2015 in setting of NSTEMI and RPAV intervention-->required temp wire but not PPM.  . Back pain   . Chronic diastolic CHF (congestive heart failure) ([place])    a. 08/2015  Echo: EF 55-60%, no rwma, mod MR, mildly dil LA/RA, mod TR, PASP 22mmHg.  Marland Kitchen Coronary artery disease    a. 08/2015 NSTEMI/PCI: LM nl, LAD 75p (med rx), 20 diffuse, 50d, LCX nl, OM1/2 nl, RCA 39m, RPAV 100 (2.25x12 Promus Premier DES) - complicated by 2:1 HB req temp wire post-PCI.  Marland Kitchen Diverticulitis   . Generalized anxiety disorder   . Hepatic steatosis   . HLD (hyperlipidemia)   . Hypertension   . Hypokalemia   . Hypomagnesemia   . Hypothyroidism   . OA (osteoarthritis)   . Osteopenia   . Sleep apnea   . Stroke [facility])    a. 08/2015 Right PCA territory infarct/temoral lobe cerebral infarction-->MRA showed severe flow liminting stenosis of R P2 cerebral artery;  b. 08/2015 Carotid U/S: 1-39% bilat ICA stenosis.  . Subdural hemorrhage ([place])    a. 05/2013 in setting of fall.  . Thrombocytopenia ([place])   . Weakness    Past Surgical History:  Procedure Laterality Date  . CARDIAC CATHETERIZATION N/A 09/08/2015   Procedure: Temporary Pacemaker;  Surgeon: Leonie Man, MD;  Location: [place] CV LAB;  Service: Cardiovascular;  Laterality: N/A;  . CARDIAC CATHETERIZATION N/A 09/08/2015   Procedure: Left Heart Cath and Coronary Angiography;  Surgeon: Leonie Man, MD;  Location: [place] CV LAB;  Service: Cardiovascular;  Laterality: N/A;  . CARDIAC CATHETERIZATION N/A 09/08/2015   Procedure: Coronary Stent  Intervention;  Surgeon: Leonie Man, MD;  Location: [place] CV LAB;  Service: Cardiovascular;  Laterality: N/A;  . CARDIAC CATHETERIZATION  09/08/2015   Procedure: Central Line Insertion;  Surgeon: Leonie Man, MD;  Location: [place] CV LAB;  Service: Cardiovascular;;  . CARDIAC CATHETERIZATION  09/08/2015   Procedure: Arterial Line Insertion;  Surgeon: Leonie Man, MD;  Location: [place] CV LAB;  Service: Cardiovascular;;  . CARDIAC CATHETERIZATION N/A 09/09/2015   Procedure: Temporary Wire;  Surgeon: Lorretta Harp, MD;  Location: [place] CV LAB;  Service: Cardiovascular;  Laterality: N/A;    Allergies  Allergies  Allergen Reactions  . Codeine Nausea And Vomiting  . Flomax [Tamsulosin] Other (See Comments)    Pt states that this medication gave her a kidney infection.    Lebron Quam [Hydrocodone-Acetaminophen] Nausea And Vomiting  . Sulfa Antibiotics Nausea And Vomiting    History of Present Illness    83 y/o ? with the above complex PMH including CAD s/p MI in Q000111Q complicated by 2:1 heart block req temp pacing.  The RPAV was stented with a DES.  She has residual LAD dzs.  During that hospitalization, she also underwent eval for stroke after a fall, revealing Right PCA territory infarct/temoral lobe cerebral infarction-->MRA showed severe flow liminting stenosis of R P2 cerebral artery.  This was  medically managed with statin and antiplatelet therapy.  Since her last visit with Dr. Fletcher Anon, she has noted progressive reduction in exercise tolerance, fatigue, and DOE.  Interestingly she notes this most while caring for her 2 y/o husband, whom has become increasingly dependent upon her.  She does not have DOE during her twice weekly water exercises (walks in pool for 30 mins w/o taking breaks).  She has not had any chest pain.  She reports a fair amt of caregiver stress and strain and she attributes her symptoms to anxiety/stress.  She denies palpitations, pnd,  orthopnea, n, v, dizziness, syncope, edema, weight gain, or early satiety. She does have occasional hot flashes that may or may not be associated with mild diaphoresis, occurring most days of the week, lasting a few mins, and resolving spontaneously.  She recently saw her PCP and had lab work.  I reviewed this in care everywhere today.  H/H, renal fxn wnl.  Plts low but relatively stable.  Mg 1.7.  Home Medications    Prior to Admission medications   Medication Sig Start Date End Date Taking? Authorizing Provider  acetaminophen (TYLENOL) 325 MG tablet Take 650 mg by mouth every 6 (six) hours as needed for mild pain, fever or headache.     Historical Provider, MD  ALPRAZolam Duanne Moron) 0.25 MG tablet Take 0.25 mg by mouth at bedtime as needed for anxiety.    Historical Provider, MD  aspirin 81 MG chewable tablet Chew 1 tablet (81 mg total) by mouth daily. 09/13/15   Brittainy Erie Noe, PA-C  atorvastatin (LIPITOR) 20 MG tablet Take 1 tablet (20 mg total) by mouth daily. 09/30/15   [name], MD  calcium-vitamin D (OSCAL WITH D) 500-200 MG-UNIT tablet Take 1 tablet by mouth 2 (two) times daily.    Historical Provider, MD  carvedilol (COREG) 3.125 MG tablet  11/11/15   Historical Provider, MD  clopidogrel (PLAVIX) 75 MG tablet Take 1 tablet (75 mg total) by mouth daily with breakfast. 09/13/15   Brittainy Erie Noe, PA-C  fluocinonide (LIDEX) 0.05 % external solution  10/28/15   Historical Provider, MD  FOLBIC 2.5-25-2 MG TABS tablet Take 1 tablet by mouth daily.    Historical Provider, MD  furosemide (LASIX) 20 MG tablet Take 1 tablet (20 mg total) by mouth daily. 01/31/16   [name], MD  KLOR-CON M20 20 MEQ tablet  10/21/15   Historical Provider, MD  levothyroxine (SYNTHROID, LEVOTHROID) 25 MCG tablet Take 25 mcg by mouth daily before breakfast.     Historical Provider, MD  Magnesium 250 MG TABS Take 250 mg by mouth daily.    Historical Provider, MD  Multiple Vitamins-Minerals (MULTIVITAMIN  GUMMIES ADULT) CHEW Chew 2 each by mouth at bedtime.    Historical Provider, MD  Multiple Vitamins-Minerals (PRESERVISION AREDS 2) CAPS Take 1 capsule by mouth daily.    Historical Provider, MD  nitroGLYCERIN (NITROSTAT) 0.4 MG SL tablet Place 1 tablet (0.4 mg total) under the tongue every 5 (five) minutes x 3 doses as needed for chest pain. 09/13/15   Brittainy Erie Noe, PA-C  omeprazole (PRILOSEC) 20 MG capsule Take 20 mg by mouth daily before breakfast.     Historical Provider, MD  simethicone (GAS-X) 80 MG chewable tablet Chew 1 tablet (80 mg total) by mouth 4 (four) times daily as needed for flatulence. 09/05/15 09/04/16  Anne-Caroline Mariea Clonts, MD  sodium chloride (OCEAN) 0.65 % SOLN nasal spray Place 1 spray into both nostrils as needed for congestion.  09/18/15   Velta Addison Mikhail, DO    Review of Systems    Increasing fatigue/DOE/reduced ex tolerance.  She denies chest pain, palpitations, pnd, orthopnea, n, v, dizziness, syncope, edema, weight gain, or early satiety.  All other systems reviewed and are otherwise negative except as noted above.  Physical Exam    VS:  BP (!) 164/84 (BP Location: Left Arm, Patient Position: Sitting, Cuff Size: Normal)   Pulse 73   Ht 5\' 2"  (1.575 m)   Wt 121 lb 4 oz (55 kg)   BMI 22.18 kg/m  , BMI Body mass index is 22.18 kg/m. GEN: Well nourished, well developed, in no acute distress.  HEENT: normal.  Neck: Supple, no JVD, carotid bruits, or masses. Cardiac: RRR, no murmurs, rubs, or gallops. No clubbing, cyanosis, edema.  Radials/DP/PT 2+ and equal bilaterally.  Respiratory:  Respirations regular and unlabored, clear to auscultation bilaterally. GI: Soft, nontender, nondistended, BS + x 4. MS: no deformity or atrophy. Skin: warm and dry, no rash. Neuro:  Strength and sensation are intact. Psych: Normal affect.  Accessory Clinical Findings    ECG - RSR, 73, non-specific st/t changes.  Assessment & Plan    1.  CAD/Dyspnea on exertion:  Pt w/ a  prior h/o inf MI in 08/2015, s/p RCA stenting and residual LAD dzs.  She has been having progressive fatigue and reduced exercise tolerance in the setting of significant caregiver strain.  Interestingly, she does well @ H2O exercises twice weekly, w/o symptoms.  She has not had any chest pain.  I spoke with her and her son @ length today.  I will arrange for a lexiscan MV to r/o ischemia.  Cont asa, statin,  blocker, and plavix.  2.  Essential HTN:  Bp elevated today.  I see that it was Q000111Q systolic @ recent pcp visit.  I've asked her to follow her bp's @ home and contact us if consistently > 140 mmHg, @ which point we can consider titrating  blocker vs adding nitrate depending on symptoms.  3. HL:  LDL 54 in 08/2015.  Cont statin.  4.  Chronic diastolic CHF:  euvolemic on exam today. BP up as above - she will trend @ home.  Elevated bp's may be contributing to dyspnea/fatigue.   5.  Dispo:  F/u myoview.  F/u in clinic in 2-3 wks.   Murray Hodgkins, NP 04/27/2016, 4:28 PM

## 2016-05-12 ENCOUNTER — Inpatient Hospital Stay (HOSPITAL_BASED_OUTPATIENT_CLINIC_OR_DEPARTMENT_OTHER): Payer: Medicare Other | Admitting: Internal Medicine

## 2016-05-12 ENCOUNTER — Inpatient Hospital Stay: Payer: Medicare Other | Attending: Internal Medicine

## 2016-05-12 VITALS — BP 136/78 | HR 73 | Temp 98.5°F | Wt 121.0 lb

## 2016-05-12 DIAGNOSIS — I2510 Atherosclerotic heart disease of native coronary artery without angina pectoris: Secondary | ICD-10-CM | POA: Insufficient documentation

## 2016-05-12 DIAGNOSIS — F419 Anxiety disorder, unspecified: Secondary | ICD-10-CM | POA: Diagnosis not present

## 2016-05-12 DIAGNOSIS — E039 Hypothyroidism, unspecified: Secondary | ICD-10-CM | POA: Insufficient documentation

## 2016-05-12 DIAGNOSIS — Z7982 Long term (current) use of aspirin: Secondary | ICD-10-CM | POA: Insufficient documentation

## 2016-05-12 DIAGNOSIS — G4730 Sleep apnea, unspecified: Secondary | ICD-10-CM | POA: Diagnosis not present

## 2016-05-12 DIAGNOSIS — M1990 Unspecified osteoarthritis, unspecified site: Secondary | ICD-10-CM | POA: Diagnosis not present

## 2016-05-12 DIAGNOSIS — Z8673 Personal history of transient ischemic attack (TIA), and cerebral infarction without residual deficits: Secondary | ICD-10-CM | POA: Diagnosis not present

## 2016-05-12 DIAGNOSIS — D696 Thrombocytopenia, unspecified: Secondary | ICD-10-CM | POA: Diagnosis not present

## 2016-05-12 DIAGNOSIS — E876 Hypokalemia: Secondary | ICD-10-CM | POA: Diagnosis not present

## 2016-05-12 DIAGNOSIS — I252 Old myocardial infarction: Secondary | ICD-10-CM | POA: Diagnosis not present

## 2016-05-12 DIAGNOSIS — E785 Hyperlipidemia, unspecified: Secondary | ICD-10-CM | POA: Insufficient documentation

## 2016-05-12 DIAGNOSIS — I5032 Chronic diastolic (congestive) heart failure: Secondary | ICD-10-CM | POA: Diagnosis not present

## 2016-05-12 DIAGNOSIS — M8580 Other specified disorders of bone density and structure, unspecified site: Secondary | ICD-10-CM | POA: Diagnosis not present

## 2016-05-12 DIAGNOSIS — Z7902 Long term (current) use of antithrombotics/antiplatelets: Secondary | ICD-10-CM | POA: Insufficient documentation

## 2016-05-12 DIAGNOSIS — Z955 Presence of coronary angioplasty implant and graft: Secondary | ICD-10-CM | POA: Diagnosis not present

## 2016-05-12 DIAGNOSIS — Z79899 Other long term (current) drug therapy: Secondary | ICD-10-CM | POA: Insufficient documentation

## 2016-05-12 DIAGNOSIS — I10 Essential (primary) hypertension: Secondary | ICD-10-CM | POA: Insufficient documentation

## 2016-05-12 LAB — CBC WITH DIFFERENTIAL/PLATELET
BASOS PCT: 1 %
Basophils Absolute: 0 10*3/uL (ref 0–0.1)
EOS ABS: 0.1 10*3/uL (ref 0–0.7)
Eosinophils Relative: 3 %
HCT: 43.3 % (ref 35.0–47.0)
Hemoglobin: 14.7 g/dL (ref 12.0–16.0)
Lymphocytes Relative: 25 %
Lymphs Abs: 1.3 10*3/uL (ref 1.0–3.6)
MCH: 30.5 pg (ref 26.0–34.0)
MCHC: 33.9 g/dL (ref 32.0–36.0)
MCV: 89.8 fL (ref 80.0–100.0)
MONO ABS: 0.5 10*3/uL (ref 0.2–0.9)
MONOS PCT: 9 %
NEUTROS PCT: 62 %
Neutro Abs: 3.3 10*3/uL (ref 1.4–6.5)
Platelets: 112 10*3/uL — ABNORMAL LOW (ref 150–440)
RBC: 4.82 MIL/uL (ref 3.80–5.20)
RDW: 14.3 % (ref 11.5–14.5)
WBC: 5.3 10*3/uL (ref 3.6–11.0)

## 2016-05-12 NOTE — Progress Notes (Signed)
[place] CONSULT NOTE  Patient Care Team: [name], MD as PCP - General (Family Medicine)  CHIEF COMPLAINTS/PURPOSE OF CONSULTATION:   # THROMBOCYTOPENIA >100  # July 2017 CAD s/p Stenting & Stroke   # ? GIB [colonoscopy- divertciulosis [facility] 2012]   No history exists.     HISTORY OF PRESENTING ILLNESS:  Teresa Franklin 83 y.o.  female history of recent diagnosis of CAD status post stenting/also complicated by cardiac shock/respiratory failure/ also stroke- is here for a follow up for thrombocytopenia.  Patient noted to have platelets of 112 today, however, review of labs showed patient had intermittent thrombocytopenia in the past. A CT scan of the abdomen and pelvis did not show any splenomegaly in June 2017.  Patient denies any alcohol use. She does report some bruising, but denies any bleeding. Does not complain of any active GI bleed.  No hematuria, hematochezia, or melena.  No nosebleeds.   ROS: A complete 10 point review of system is done which is negative except mentioned above in history of present illness  MEDICAL HISTORY:  Past Medical History:  Diagnosis Date  . Anxiety   . AV block    a. 08/2015 in setting of NSTEMI and RPAV intervention-->required temp wire but not PPM.  . Back pain   . Chronic diastolic CHF (congestive heart failure) ([place])    a. 08/2015  Echo: EF 55-60%, no rwma, mod MR, mildly dil LA/RA, mod TR, PASP 51mHg.  .Marland KitchenCoronary artery disease    a. 08/2015 NSTEMI/PCI: LM nl, LAD 75p (med rx), 20 diffuse, 50d, LCX nl, OM1/2 nl, RCA 24mRPAV 100 (2.25x12 Promus Premier DES) - complicated by 2:1 HB req temp wire post-PCI.  . Marland Kitcheniverticulitis   . Generalized anxiety disorder   . Hepatic steatosis   . HLD (hyperlipidemia)   . Hypertension   . Hypokalemia   . Hypomagnesemia   . Hypothyroidism   . OA (osteoarthritis)   . Osteopenia   . Sleep apnea   [facility]   a. 08/2015 Right PCA territory infarct/temoral lobe cerebral  infarction-->MRA showed severe flow liminting stenosis of R P2 cerebral artery;  b. 08/2015 Carotid U/S: 1-39% bilat ICA stenosis.  . Subdural hemorrhage ([place]   a. 05/2013 in setting of fall.  . Thrombocytopenia ([place]  . Weakness     SURGICAL HISTORY: Past Surgical History:  Procedure Laterality Date  . CARDIAC CATHETERIZATION N/A 09/08/2015   Procedure: Temporary Pacemaker;  Surgeon: DaLeonie ManMD;  [place] [facility];  Service: Cardiovascular;  Laterality: N/A;  . CARDIAC CATHETERIZATION N/A 09/08/2015   Procedure: Left Heart Cath and Coronary Angiography;  Surgeon: DaLeonie ManMD;  [place] [facility];  Service: Cardiovascular;  Laterality: N/A;  . CARDIAC CATHETERIZATION N/A 09/08/2015   Procedure: Coronary Stent Intervention;  Surgeon: DaLeonie ManMD;  [place] [place];  Service: Cardiovascular;  Laterality: N/A;  . CARDIAC CATHETERIZATION  09/08/2015   Procedure: Central Line Insertion;  Surgeon: DaLeonie ManMD;  [place];  Service: Cardiovascular;;  . CARDIAC CATHETERIZATION  09/08/2015   Procedure: Arterial Line Insertion;  Surgeon: DaLeonie ManMD;  [place];  Service: Cardiovascular;;  . CARDIAC CATHETERIZATION N/A 09/09/2015   Procedure: Temporary Wire;  Surgeon: JoLorretta HarpMD;  [place] [facility];  Service: Cardiovascular;  Laterality: N/A;    SOCIAL HISTORY: worked until until 80Tax adviser  retd; no smoking/alcohol/ lives with husband in [place].  Social History   Social History  . Marital status: Married    Spouse name: N/A  . Number of children: N/A  . Years of education: N/A   Occupational History  . Not on file.   Social History Main Topics  . Smoking status: Never Smoker  . Smokeless tobacco: Never Used  . Alcohol use No  . Drug use: No  . Sexual activity: No   Other Topics Concern  . Not on file   Social History Narrative  . No narrative on  file    FAMILY HISTORY: Family History  Problem Relation Age of Onset  . Hypertension Mother   . Hyperlipidemia Mother   . Stroke Mother   . Diabetes Sister   . Hypertension Sister   . Hyperlipidemia Sister   . Bipolar disorder Sister   . Hyperlipidemia Sister   . Stroke Sister   . Prostate cancer Brother   . Breast cancer Neg Hx     ALLERGIES:  is allergic to codeine; flomax [tamsulosin]; norco [hydrocodone-acetaminophen]; and sulfa antibiotics.  MEDICATIONS:  Current Outpatient Prescriptions  Medication Sig Dispense Refill  . acetaminophen (TYLENOL) 325 MG tablet Take 650 mg by mouth every 6 (six) hours as needed for mild pain, fever or headache.     . ALPRAZolam (XANAX) 0.25 MG tablet Take 0.25 mg by mouth at bedtime as needed for anxiety.    Marland Kitchen aspirin 81 MG chewable tablet Chew 1 tablet (81 mg total) by mouth daily.    Marland Kitchen atorvastatin (LIPITOR) 20 MG tablet Take 1 tablet (20 mg total) by mouth daily. 30 tablet 5  . calcium-vitamin D (OSCAL WITH D) 500-200 MG-UNIT tablet Take 1 tablet by mouth 2 (two) times daily.    . carvedilol (COREG) 3.125 MG tablet Take 3.125 mg by mouth 2 (two) times daily with a meal.     . clopidogrel (PLAVIX) 75 MG tablet Take 1 tablet (75 mg total) by mouth daily with breakfast. 30 tablet 11  . fluocinonide (LIDEX) 0.05 % external solution     . folic acid-pyridoxine-cyancobalamin (FOLTX) 2.5-25-2 MG TABS tablet Take by mouth.    . furosemide (LASIX) 20 MG tablet Take 1 tablet (20 mg total) by mouth daily. 90 tablet 1  . KLOR-CON M20 20 MEQ tablet Take 20 mEq by mouth once.     Marland Kitchen levothyroxine (SYNTHROID, LEVOTHROID) 25 MCG tablet Take 25 mcg by mouth daily before breakfast.     . lisinopril (PRINIVIL,ZESTRIL) 40 MG tablet Take 40 mg by mouth daily.    . magnesium oxide (MAG-OX) 400 MG tablet Take 400 mg by mouth daily.    . Multiple Vitamins-Minerals (MULTIVITAMIN GUMMIES ADULT) CHEW Chew 2 each by mouth at bedtime.    . Multiple Vitamins-Minerals  (PRESERVISION AREDS 2) CAPS Take 1 capsule by mouth daily.    . nitroGLYCERIN (NITROSTAT) 0.4 MG SL tablet Place 1 tablet (0.4 mg total) under the tongue every 5 (five) minutes x 3 doses as needed for chest pain. 25 tablet 2  . omeprazole (PRILOSEC) 20 MG capsule Take 20 mg by mouth daily before breakfast.     . senna-docusate (SENOKOT-S) 8.6-50 MG tablet Take by mouth.    . simethicone (GAS-X) 80 MG chewable tablet Chew 1 tablet (80 mg total) by mouth 4 (four) times daily as needed for flatulence. 20 tablet 0  . sodium chloride (OCEAN) 0.65 % SOLN nasal spray Place 1 spray into both nostrils as needed  for congestion. 1 Bottle 0   No current facility-administered medications for this visit.       Marland Kitchen  PHYSICAL EXAMINATION:   Vitals:   05/12/16 1410  BP: 136/78  Pulse: 73  Temp: 98.5 F (36.9 C)   Filed Weights   05/12/16 1410  Weight: 121 lb 0.5 oz (54.9 kg)    GENERAL: Well-nourished well-developed; Alert, no distress and comfortable.   Alone.  EYES: no pallor or icterus OROPHARYNX: no thrush or ulceration; good dentition  NECK: supple, no masses felt LYMPH:  no palpable lymphadenopathy in the cervical, axillary or inguinal regions LUNGS: clear to auscultation and  No wheeze or crackles HEART/CVS: regular rate & rhythm and no murmurs; No lower extremity edema ABDOMEN: abdomen soft, non-tender and normal bowel sounds Musculoskeletal:no cyanosis of digits and no clubbing  PSYCH: alert & oriented x 3 with fluent speech NEURO: no focal motor/sensory deficits SKIN:  Minor small diffuse bruising. No rashes or significant lesions.   LABORATORY DATA:  I have reviewed the data as listed Lab Results  Component Value Date   WBC 5.3 05/12/2016   HGB 14.7 05/12/2016   HCT 43.3 05/12/2016   MCV 89.8 05/12/2016   PLT 112 (L) 05/12/2016    Recent Labs  09/09/15 0350 09/10/15 0357  09/12/15 0337 09/13/15 0342 09/15/15 0526 11/12/15 1454  NA 136 136  < > 140 138  --  139  K  3.2* 4.0  < > 3.5 3.4*  --  3.5  CL 108 110  < > 105 105  --  104  CO2 21* 21*  < > 25 25  --  26  GLUCOSE 151* 160*  < > 132* 115*  --  122*  BUN 13 25*  < > 29* 17  --  14  CREATININE 0.89 1.02*  < > 1.06* 0.88 0.65 0.83  CALCIUM 8.4* 8.3*  < > 8.6* 8.7*  --  9.4  GFRNONAA 59* 50*  < > 48* 60* >60 >60  GFRAA >60 58*  < > 55* >60 >60 >60  PROT 5.3* 5.0*  --   --   --   --  7.1  ALBUMIN 2.6* 2.2*  --   --   --   --  4.2  AST 80* 46*  --   --   --   --  27  ALT 46 31  --   --   --   --  20  ALKPHOS 79 71  --   --   --   --  64  BILITOT 1.0 0.9  --   --   --   --  0.6  < > = values in this interval not displayed.  RADIOGRAPHIC STUDIES: I have personally reviewed the radiological images as listed and agreed with the findings in the report. No results found.  ASSESSMENT & PLAN:   Thrombocytopenia ([place]) Unclear etiology, ITP vs MDS; CT negative for splenomegaly.  # Most recent platelets above 100,000/asymptomatic [patient on Plavix aspirin]; monitor closely for now. If worse recommend bone marrow biopsy. Platelets today- 112/ asymptomatic.   # CAD status post stenting on aspirin and Plavix  # Recommend follow-up with me in 3 months with labs (CBC, BMP).   All questions were answered. The patient knows to call the clinic with any problems, questions or concerns.     Cammie Sickle, MD 05/12/2016 3:23 PM

## 2016-05-12 NOTE — Assessment & Plan Note (Addendum)
Unclear etiology, ITP vs MDS; CT negative for splenomegaly.  # Most recent platelets above 100,000/asymptomatic [patient on Plavix aspirin]; monitor closely for now. If worse recommend bone marrow biopsy. Platelets today- 112/ asymptomatic.   # CAD status post stenting on aspirin and Plavix  # Recommend follow-up with me in 3 months with labs (CBC, BMP).

## 2016-05-12 NOTE — Progress Notes (Signed)
Patient here today for follow up.   

## 2016-05-13 ENCOUNTER — Encounter
Admission: RE | Admit: 2016-05-13 | Discharge: 2016-05-13 | Disposition: A | Discharge status: Home / Self Care | Payer: Medicare Other | Source: Ambulatory Visit | Attending: Nurse Practitioner | Admitting: Nurse Practitioner

## 2016-05-13 DIAGNOSIS — I25119 Atherosclerotic heart disease of native coronary artery with unspecified angina pectoris: Secondary | ICD-10-CM | POA: Diagnosis not present

## 2016-05-13 DIAGNOSIS — I10 Essential (primary) hypertension: Secondary | ICD-10-CM | POA: Insufficient documentation

## 2016-05-13 DIAGNOSIS — R931 Abnormal findings on diagnostic imaging of heart and coronary circulation: Secondary | ICD-10-CM | POA: Insufficient documentation

## 2016-05-13 LAB — NM MYOCAR MULTI W/SPECT W/WALL MOTION / EF
CHL CUP NUCLEAR SDS: 1
CSEPPHR: 100 {beats}/min
LVDIAVOL: 91 mL (ref 46–106)
LVSYSVOL: 45 mL
Percent HR: 72 %
Rest HR: 71 {beats}/min
SRS: 14
SSS: 9
TID: 0.97

## 2016-05-13 MED ORDER — TECHNETIUM TC 99M TETROFOSMIN IV KIT
12.6000 | PACK | Freq: Once | INTRAVENOUS | Status: AC | PRN
  Administered 2016-05-13: 12.6 via INTRAVENOUS

## 2016-05-13 MED ORDER — REGADENOSON 0.4 MG/5ML IV SOLN
0.4000 mg | Freq: Once | INTRAVENOUS | Status: AC
  Administered 2016-05-13: 0.4 mg via INTRAVENOUS

## 2016-05-13 MED ORDER — TECHNETIUM TC 99M TETROFOSMIN IV KIT
30.0000 | PACK | Freq: Once | INTRAVENOUS | Status: AC | PRN
  Administered 2016-05-13: 31.419 via INTRAVENOUS

## 2016-05-14 ENCOUNTER — Other Ambulatory Visit: Payer: Self-pay | Admitting: *Deleted

## 2016-05-14 DIAGNOSIS — I10 Essential (primary) hypertension: Secondary | ICD-10-CM

## 2016-05-14 DIAGNOSIS — I5033 Acute on chronic diastolic (congestive) heart failure: Secondary | ICD-10-CM

## 2016-05-19 ENCOUNTER — Ambulatory Visit: Payer: Medicare Other | Admitting: Cardiovascular Disease

## 2016-05-29 ENCOUNTER — Ambulatory Visit: Payer: Medicare Other | Admitting: Cardiovascular Disease

## 2016-06-01 ENCOUNTER — Ambulatory Visit: Payer: Medicare Other | Admitting: Cardiovascular Disease

## 2016-06-02 ENCOUNTER — Ambulatory Visit (INDEPENDENT_AMBULATORY_CARE_PROVIDER_SITE_OTHER): Payer: Medicare Other

## 2016-06-02 ENCOUNTER — Other Ambulatory Visit: Payer: Self-pay

## 2016-06-02 DIAGNOSIS — I10 Essential (primary) hypertension: Secondary | ICD-10-CM

## 2016-06-02 DIAGNOSIS — I5033 Acute on chronic diastolic (congestive) heart failure: Secondary | ICD-10-CM

## 2016-06-12 ENCOUNTER — Ambulatory Visit: Payer: Medicare Other | Admitting: Cardiovascular Disease

## 2016-06-15 ENCOUNTER — Encounter: Payer: Self-pay | Admitting: Cardiovascular Disease

## 2016-06-15 ENCOUNTER — Ambulatory Visit (INDEPENDENT_AMBULATORY_CARE_PROVIDER_SITE_OTHER): Payer: Medicare Other | Admitting: Cardiovascular Disease

## 2016-06-15 VITALS — BP 158/92 | HR 65 | Ht 62.0 in | Wt 121.8 lb

## 2016-06-15 DIAGNOSIS — E785 Hyperlipidemia, unspecified: Secondary | ICD-10-CM | POA: Diagnosis not present

## 2016-06-15 DIAGNOSIS — I25118 Atherosclerotic heart disease of native coronary artery with other forms of angina pectoris: Secondary | ICD-10-CM

## 2016-06-15 DIAGNOSIS — I5022 Chronic systolic (congestive) heart failure: Secondary | ICD-10-CM

## 2016-06-15 NOTE — Progress Notes (Signed)
Cardiology Office Note   Date:  06/15/2016   ID:  Teresa Franklin, DOB 12/24/1930, MRN [id number]  PCP:  [name], MD  Cardiologist:   [name], MD   Chief Complaint  Patient presents with  . other     4WK F/U post myoview/echo. Pt c/o hot flashes, some sob, weak spells. Pt concerned about medication having something to do with her symptoms. Reviewed meds with pt verbally.      History of Present Illness: Teresa Franklin is a 83 y.o. female who presents for a follow-up visit regarding coronary artery disease. She presented in June 2017 with non-ST elevation myocardial infarction which was complicated by hypotension and high-grade AV block. She was transferred to [facility] where she underwent urgent cardiac catheterization which showed an occluded right posterior AV groove artery which was treated successfully with PCI and drug-eluting stent placement. She did not require a permanent pacemaker. Ejection fraction was normal. There was significant disease affecting the proximal and mid LAD. Medical therapy was recommended considering her frail status and involvement of a large diagonal branch at the site of stenosis. She also suffered from what seems to be a stroke on June 25 which was confirmed with an MRI. However, she had no residual neurologic deficits. She was seen by Ignacia Bayley in February for progressive fatigue and shortness of breath. No reported chest pain but she never had any chest pain even with her prior myocardial infarction. 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 She does well with water aerobics. She does complain of hot flashes and she thinks these might be related to some of her medications. She underwent a pharmacologic nuclear stress test which showed no evidence of ischemia. There was evidence of prior infarct in the lateral wall with an ejection fraction of 34%. This was followed by an echocardiogram which showed  an ejection fraction of 30-35% with severe hypokinesis of the anterolateral and inferior wall, moderate mitral regurgitation and no evidence of pulmonary hypertension.    Past Medical History:  Diagnosis Date  . Anxiety   . AV block    a. 08/2015 in setting of NSTEMI and RPAV intervention-->required temp wire but not PPM.  . Back pain   . Chronic diastolic CHF (congestive heart failure) ([place])    a. 08/2015  Echo: EF 55-60%, no rwma, mod MR, mildly dil LA/RA, mod TR, PASP 10mmHg.  Marland Kitchen Coronary artery disease    a. 08/2015 NSTEMI/PCI: LM nl, LAD 75p (med rx), 20 diffuse, 50d, LCX nl, OM1/2 nl, RCA 67m, RPAV 100 (2.25x12 Promus Premier DES) - complicated by 2:1 HB req temp wire post-PCI.  Marland Kitchen Diverticulitis   . Generalized anxiety disorder   . Hepatic steatosis   . HLD (hyperlipidemia)   . Hypertension   . Hypokalemia   . Hypomagnesemia   . Hypothyroidism   . OA (osteoarthritis)   . Osteopenia   . Sleep apnea   . Stroke [facility])    a. 08/2015 Right PCA territory infarct/temoral lobe cerebral infarction-->MRA showed severe flow liminting stenosis of R P2 cerebral artery;  b. 08/2015 Carotid U/S: 1-39% bilat ICA stenosis.  . Subdural hemorrhage ([place])    a. 05/2013 in setting of fall.  . Thrombocytopenia ([place])   . Weakness     Past Surgical History:  Procedure Laterality Date  . CARDIAC CATHETERIZATION N/A 09/08/2015   Procedure: Temporary Pacemaker;  Surgeon: Leonie Man, MD;  Location: [facility]  INVASIVE CV LAB;  Service: Cardiovascular;  Laterality: N/A;  . CARDIAC CATHETERIZATION N/A 09/08/2015   Procedure: Left Heart Cath and Coronary Angiography;  Surgeon: Leonie Man, MD;  Location: [place] CV LAB;  Service: Cardiovascular;  Laterality: N/A;  . CARDIAC CATHETERIZATION N/A 09/08/2015   Procedure: Coronary Stent Intervention;  Surgeon: Leonie Man, MD;  Location: [place] CV LAB;  Service: Cardiovascular;  Laterality: N/A;  . CARDIAC CATHETERIZATION  09/08/2015   Procedure:  Central Line Insertion;  Surgeon: Leonie Man, MD;  Location: [place] CV LAB;  Service: Cardiovascular;;  . CARDIAC CATHETERIZATION  09/08/2015   Procedure: Arterial Line Insertion;  Surgeon: Leonie Man, MD;  Location: [place] CV LAB;  Service: Cardiovascular;;  . CARDIAC CATHETERIZATION N/A 09/09/2015   Procedure: Temporary Wire;  Surgeon: Lorretta Harp, MD;  Location: [place] CV LAB;  Service: Cardiovascular;  Laterality: N/A;     Current Outpatient Prescriptions  Medication Sig Dispense Refill  . acetaminophen (TYLENOL) 325 MG tablet Take 650 mg by mouth every 6 (six) hours as needed for mild pain, fever or headache.     . ALPRAZolam (XANAX) 0.25 MG tablet Take 0.25 mg by mouth at bedtime as needed for anxiety.    Marland Kitchen aspirin 81 MG chewable tablet Chew 1 tablet (81 mg total) by mouth daily.    Marland Kitchen atorvastatin (LIPITOR) 20 MG tablet Take 1 tablet (20 mg total) by mouth daily. 30 tablet 5  . calcium-vitamin D (OSCAL WITH D) 500-200 MG-UNIT tablet Take 1 tablet by mouth 2 (two) times daily.    . carvedilol (COREG) 3.125 MG tablet Take 3.125 mg by mouth 2 (two) times daily with a meal.     . clopidogrel (PLAVIX) 75 MG tablet Take 1 tablet (75 mg total) by mouth daily with breakfast. 30 tablet 11  . fluocinonide (LIDEX) 0.05 % external solution     . folic acid-pyridoxine-cyancobalamin (FOLTX) 2.5-25-2 MG TABS tablet Take by mouth.    . furosemide (LASIX) 20 MG tablet Take 1 tablet (20 mg total) by mouth daily. 90 tablet 1  . KLOR-CON M20 20 MEQ tablet Take 20 mEq by mouth once.     Marland Kitchen levothyroxine (SYNTHROID, LEVOTHROID) 25 MCG tablet Take 25 mcg by mouth daily before breakfast.     . lisinopril (PRINIVIL,ZESTRIL) 40 MG tablet Take 40 mg by mouth daily.    . magnesium oxide (MAG-OX) 400 MG tablet Take 400 mg by mouth daily.    . Multiple Vitamins-Minerals (MULTIVITAMIN GUMMIES ADULT) CHEW Chew 2 each by mouth at bedtime.    . Multiple Vitamins-Minerals (PRESERVISION AREDS  2) CAPS Take 1 capsule by mouth daily.    . nitroGLYCERIN (NITROSTAT) 0.4 MG SL tablet Place 1 tablet (0.4 mg total) under the tongue every 5 (five) minutes x 3 doses as needed for chest pain. 25 tablet 2  . omeprazole (PRILOSEC) 20 MG capsule Take 20 mg by mouth daily before breakfast.     . senna-docusate (SENOKOT-S) 8.6-50 MG tablet Take by mouth.    . simethicone (GAS-X) 80 MG chewable tablet Chew 1 tablet (80 mg total) by mouth 4 (four) times daily as needed for flatulence. 20 tablet 0  . sodium chloride (OCEAN) 0.65 % SOLN nasal spray Place 1 spray into both nostrils as needed for congestion. 1 Bottle 0   No current facility-administered medications for this visit.     Allergies:   Codeine; Flomax [tamsulosin]; Norco [hydrocodone-acetaminophen]; and Sulfa antibiotics    Social  History:  The patient  reports that she has never smoked. She has never used smokeless tobacco. She reports that she does not drink alcohol or use drugs.   Family History:  The patient's family history includes Bipolar disorder in her sister; Diabetes in her sister; Hyperlipidemia in her mother, sister, and sister; Hypertension in her mother and sister; Prostate cancer in her brother; Stroke in her mother and sister.    ROS:  Please see the history of present illness.   Otherwise, review of systems are positive for none.   All other systems are reviewed and negative.    PHYSICAL EXAM: VS:  BP (!) 158/92 (BP Location: Left Arm, Patient Position: Sitting, Cuff Size: Normal)   Pulse 65   Ht 5\' 2"  (1.575 m)   Wt 121 lb 12 oz (55.2 kg)   BMI 22.27 kg/m  , BMI Body mass index is 22.27 kg/m. GEN: Well nourished, well developed, in no acute distress  HEENT: normal  Neck: no JVD, carotid bruits, or masses Cardiac: RRR; 1/6 systolic ejection murmur in the aortic area, no rubs, or gallops,no edema  Respiratory:  clear to auscultation bilaterally, normal work of breathing GI: soft, nontender, nondistended, + BS MS:  no deformity or atrophy  Skin: warm and dry, no rash Neuro:  Strength and sensation are intact Psych: euthymic mood, full affect   EKG:  EKG is ordered today. The ekg ordered today demonstrates normal sinus rhythm with nonspecific ST or T wave changes.   Recent Labs: 09/12/2015: Magnesium 2.0 09/16/2015: B Natriuretic Peptide 1,214.1 11/12/2015: ALT 20; BUN 14; Creatinine, Ser 0.83; Potassium 3.5; Sodium 139 05/12/2016: Hemoglobin 14.7; Platelets 112    Lipid Panel    Component Value Date/Time   CHOL 114 09/09/2015 0350   TRIG 111 09/09/2015 0350   HDL 38 (L) 09/09/2015 0350   CHOLHDL 3.0 09/09/2015 0350   VLDL 22 09/09/2015 0350   LDLCALC 54 09/09/2015 0350      Wt Readings from Last 3 Encounters:  06/15/16 121 lb 12 oz (55.2 kg)  05/12/16 121 lb 0.5 oz (54.9 kg)  04/27/16 121 lb 4 oz (55 kg)       ASSESSMENT AND PLAN:  1.  Coronary artery disease involving native coronary arteries with other forms of angina: The patient describes exertional dyspnea and fatigue which could be angina equivalent especially that she had no chest pain at all during her presentation with myocardial infarction. Echo and stress test both showed a drop in her ejection fraction to 30-35%. Her symptoms are very atypical and it's hard to know how much of her symptoms are related to angina versus stress and anxiety. I advised her to try to increase her physical activities and if there is no improvement in symptoms which can consider proceeding with cardiac catheterization and possible PCI. I did review the cardiac cath images with her which showed residual disease in the LAD but proceeding with PCI will be associated with higher risk of diagonal branch closure.  2. Chronic systolic  heart failure:   She appears to be euvolemic on current dose of furosemide 20 mg daily. Continue treatment with carvedilol and lisinopril. I'm hesitant to increase or add any medications given that she is already worried about  side effects.   3. Hyperlipidemia: Continue treatment with atorvastatin. LDL was 54 in June..  4. Hot flashes: I don't think this is related to any of her cardiac medications.  Disposition:   FU with me in 3 months  Signed,  [name], MD  06/15/2016 3:07 PM    [facility]

## 2016-06-15 NOTE — Patient Instructions (Signed)
Medication Instructions: Continue same medications.   Labwork: None.   Procedures/Testing: None.   Follow-Up: 3 months with Dr. Agatha Duplechain.   Any Additional Special Instructions Will Be Listed Below (If Applicable).     If you need a refill on your cardiac medications before your next appointment, please call your pharmacy.   

## 2016-06-18 NOTE — Addendum Note (Signed)
Addended by: Kittie Plater on: 06/18/2016 08:26 AM   Modules accepted: Orders

## 2016-07-07 ENCOUNTER — Observation Stay
Admission: EM | Admit: 2016-07-07 | Discharge: 2016-07-10 | Disposition: A | Discharge status: Skilled Nursing Facility | Payer: Medicare Other | Attending: Internal Medicine | Admitting: Internal Medicine

## 2016-07-07 ENCOUNTER — Encounter: Payer: Self-pay | Admitting: Emergency Medicine

## 2016-07-07 ENCOUNTER — Emergency Department: Payer: Medicare Other

## 2016-07-07 DIAGNOSIS — Z8042 Family history of malignant neoplasm of prostate: Secondary | ICD-10-CM | POA: Insufficient documentation

## 2016-07-07 DIAGNOSIS — I110 Hypertensive heart disease with heart failure: Secondary | ICD-10-CM | POA: Diagnosis not present

## 2016-07-07 DIAGNOSIS — K219 Gastro-esophageal reflux disease without esophagitis: Secondary | ICD-10-CM | POA: Diagnosis not present

## 2016-07-07 DIAGNOSIS — Y9301 Activity, walking, marching and hiking: Secondary | ICD-10-CM | POA: Diagnosis not present

## 2016-07-07 DIAGNOSIS — D696 Thrombocytopenia, unspecified: Secondary | ICD-10-CM | POA: Diagnosis not present

## 2016-07-07 DIAGNOSIS — I2510 Atherosclerotic heart disease of native coronary artery without angina pectoris: Secondary | ICD-10-CM | POA: Insufficient documentation

## 2016-07-07 DIAGNOSIS — E039 Hypothyroidism, unspecified: Secondary | ICD-10-CM | POA: Insufficient documentation

## 2016-07-07 DIAGNOSIS — M1990 Unspecified osteoarthritis, unspecified site: Secondary | ICD-10-CM | POA: Diagnosis not present

## 2016-07-07 DIAGNOSIS — S62112A Displaced fracture of triquetrum [cuneiform] bone, left wrist, initial encounter for closed fracture: Secondary | ICD-10-CM | POA: Diagnosis not present

## 2016-07-07 DIAGNOSIS — F411 Generalized anxiety disorder: Secondary | ICD-10-CM | POA: Diagnosis not present

## 2016-07-07 DIAGNOSIS — Z882 Allergy status to sulfonamides status: Secondary | ICD-10-CM | POA: Insufficient documentation

## 2016-07-07 DIAGNOSIS — E785 Hyperlipidemia, unspecified: Secondary | ICD-10-CM | POA: Insufficient documentation

## 2016-07-07 DIAGNOSIS — S42211A Unspecified displaced fracture of surgical neck of right humerus, initial encounter for closed fracture: Principal | ICD-10-CM | POA: Insufficient documentation

## 2016-07-07 DIAGNOSIS — Z955 Presence of coronary angioplasty implant and graft: Secondary | ICD-10-CM | POA: Insufficient documentation

## 2016-07-07 DIAGNOSIS — M25431 Effusion, right wrist: Secondary | ICD-10-CM | POA: Insufficient documentation

## 2016-07-07 DIAGNOSIS — Z888 Allergy status to other drugs, medicaments and biological substances status: Secondary | ICD-10-CM | POA: Insufficient documentation

## 2016-07-07 DIAGNOSIS — G4730 Sleep apnea, unspecified: Secondary | ICD-10-CM | POA: Insufficient documentation

## 2016-07-07 DIAGNOSIS — E876 Hypokalemia: Secondary | ICD-10-CM | POA: Diagnosis not present

## 2016-07-07 DIAGNOSIS — W1839XA Other fall on same level, initial encounter: Secondary | ICD-10-CM | POA: Insufficient documentation

## 2016-07-07 DIAGNOSIS — Z833 Family history of diabetes mellitus: Secondary | ICD-10-CM | POA: Insufficient documentation

## 2016-07-07 DIAGNOSIS — Z7902 Long term (current) use of antithrombotics/antiplatelets: Secondary | ICD-10-CM | POA: Insufficient documentation

## 2016-07-07 DIAGNOSIS — Z823 Family history of stroke: Secondary | ICD-10-CM | POA: Insufficient documentation

## 2016-07-07 DIAGNOSIS — Z7982 Long term (current) use of aspirin: Secondary | ICD-10-CM | POA: Insufficient documentation

## 2016-07-07 DIAGNOSIS — Z01810 Encounter for preprocedural cardiovascular examination: Secondary | ICD-10-CM

## 2016-07-07 DIAGNOSIS — S42401A Unspecified fracture of lower end of right humerus, initial encounter for closed fracture: Secondary | ICD-10-CM | POA: Diagnosis present

## 2016-07-07 DIAGNOSIS — I252 Old myocardial infarction: Secondary | ICD-10-CM | POA: Insufficient documentation

## 2016-07-07 DIAGNOSIS — J9601 Acute respiratory failure with hypoxia: Secondary | ICD-10-CM | POA: Insufficient documentation

## 2016-07-07 DIAGNOSIS — I442 Atrioventricular block, complete: Secondary | ICD-10-CM | POA: Diagnosis not present

## 2016-07-07 DIAGNOSIS — S52021A Displaced fracture of olecranon process without intraarticular extension of right ulna, initial encounter for closed fracture: Secondary | ICD-10-CM | POA: Diagnosis not present

## 2016-07-07 DIAGNOSIS — I6523 Occlusion and stenosis of bilateral carotid arteries: Secondary | ICD-10-CM | POA: Diagnosis not present

## 2016-07-07 DIAGNOSIS — Z885 Allergy status to narcotic agent status: Secondary | ICD-10-CM | POA: Insufficient documentation

## 2016-07-07 DIAGNOSIS — Z8673 Personal history of transient ischemic attack (TIA), and cerebral infarction without residual deficits: Secondary | ICD-10-CM | POA: Diagnosis not present

## 2016-07-07 DIAGNOSIS — K760 Fatty (change of) liver, not elsewhere classified: Secondary | ICD-10-CM | POA: Diagnosis not present

## 2016-07-07 DIAGNOSIS — M11231 Other chondrocalcinosis, right wrist: Secondary | ICD-10-CM | POA: Insufficient documentation

## 2016-07-07 DIAGNOSIS — W19XXXA Unspecified fall, initial encounter: Secondary | ICD-10-CM

## 2016-07-07 DIAGNOSIS — Z8249 Family history of ischemic heart disease and other diseases of the circulatory system: Secondary | ICD-10-CM | POA: Insufficient documentation

## 2016-07-07 DIAGNOSIS — M8580 Other specified disorders of bone density and structure, unspecified site: Secondary | ICD-10-CM | POA: Diagnosis not present

## 2016-07-07 DIAGNOSIS — Z79899 Other long term (current) drug therapy: Secondary | ICD-10-CM | POA: Insufficient documentation

## 2016-07-07 DIAGNOSIS — I5043 Acute on chronic combined systolic (congestive) and diastolic (congestive) heart failure: Secondary | ICD-10-CM | POA: Diagnosis not present

## 2016-07-07 LAB — BASIC METABOLIC PANEL
ANION GAP: 7 (ref 5–15)
BUN: 17 mg/dL (ref 6–20)
CALCIUM: 9.1 mg/dL (ref 8.9–10.3)
CHLORIDE: 102 mmol/L (ref 101–111)
CO2: 29 mmol/L (ref 22–32)
Creatinine, Ser: 0.85 mg/dL (ref 0.44–1.00)
GFR calc Af Amer: >60 mL/min (ref >60)
GFR calc non Af Amer: >60 mL/min (ref >60)
Glucose, Bld: 143 mg/dL — ABNORMAL HIGH (ref 65–99)
POTASSIUM: 3.3 mmol/L — AB (ref 3.5–5.1)
Sodium: 138 mmol/L (ref 135–145)

## 2016-07-07 LAB — CBC WITH DIFFERENTIAL/PLATELET
BASOS PCT: 0 %
Basophils Absolute: 0 10*3/uL (ref 0–0.1)
Eosinophils Absolute: 0.1 10*3/uL (ref 0–0.7)
Eosinophils Relative: 1 %
HEMATOCRIT: 40.3 % (ref 35.0–47.0)
HEMOGLOBIN: 13.9 g/dL (ref 12.0–16.0)
LYMPHS PCT: 6 %
Lymphs Abs: 0.5 10*3/uL — ABNORMAL LOW (ref 1.0–3.6)
MCH: 31.2 pg (ref 26.0–34.0)
MCHC: 34.5 g/dL (ref 32.0–36.0)
MCV: 90.4 fL (ref 80.0–100.0)
MONO ABS: 0.4 10*3/uL (ref 0.2–0.9)
Monocytes Relative: 4 %
NEUTROS ABS: 8 10*3/uL — AB (ref 1.4–6.5)
NEUTROS PCT: 89 %
Platelets: 102 10*3/uL — ABNORMAL LOW (ref 150–440)
RBC: 4.46 MIL/uL (ref 3.80–5.20)
RDW: 14.5 % (ref 11.5–14.5)
WBC: 9 10*3/uL (ref 3.6–11.0)

## 2016-07-07 MED ORDER — ALPRAZOLAM 0.25 MG PO TABS
0.2500 mg | ORAL_TABLET | Freq: Every evening | ORAL | Status: DC | PRN
  Administered 2016-07-08 – 2016-07-09 (×3): 0.25 mg via ORAL
  Filled 2016-07-07 (×3): qty 1

## 2016-07-07 MED ORDER — ALPRAZOLAM 0.5 MG PO TABS
ORAL_TABLET | ORAL | Status: AC
  Filled 2016-07-07: qty 1

## 2016-07-07 MED ORDER — ACETAMINOPHEN 650 MG RE SUPP: 650.0000 mg | Freq: Four times a day (QID) | RECTAL | Status: DC | PRN

## 2016-07-07 MED ORDER — PROMETHAZINE HCL 25 MG/ML IJ SOLN
12.5000 mg | Freq: Once | INTRAMUSCULAR | Status: AC
  Administered 2016-07-07: 12.5 mg via INTRAMUSCULAR

## 2016-07-07 MED ORDER — MAGNESIUM OXIDE 400 (241.3 MG) MG PO TABS
400.0000 mg | ORAL_TABLET | Freq: Every day | ORAL | Status: DC
  Administered 2016-07-08 – 2016-07-10 (×3): 400 mg via ORAL
  Filled 2016-07-07 (×3): qty 1

## 2016-07-07 MED ORDER — SENNOSIDES-DOCUSATE SODIUM 8.6-50 MG PO TABS
2.0000 | ORAL_TABLET | Freq: Two times a day (BID) | ORAL | Status: DC
  Administered 2016-07-07 – 2016-07-10 (×6): 2 via ORAL
  Filled 2016-07-07 (×6): qty 2

## 2016-07-07 MED ORDER — POLYETHYLENE GLYCOL 3350 17 G PO PACK
17.0000 g | PACK | Freq: Every day | ORAL | Status: DC | PRN
Start: 2016-07-07 — End: 2016-07-10

## 2016-07-07 MED ORDER — MORPHINE SULFATE (PF) 2 MG/ML IV SOLN
INTRAVENOUS | Status: DC
Start: 2016-07-07 — End: 2016-07-07
  Administered 2016-07-07: 2 mg via INTRAMUSCULAR
  Filled 2016-07-07: qty 1

## 2016-07-07 MED ORDER — KETOROLAC TROMETHAMINE 15 MG/ML IJ SOLN
15.0000 mg | Freq: Once | INTRAMUSCULAR | Status: AC
  Administered 2016-07-07: 15 mg via INTRAVENOUS
  Filled 2016-07-07: qty 1

## 2016-07-07 MED ORDER — ACETAMINOPHEN 325 MG PO TABS
650.0000 mg | ORAL_TABLET | Freq: Four times a day (QID) | ORAL | Status: DC | PRN
  Administered 2016-07-08 – 2016-07-10 (×2): 650 mg via ORAL
  Filled 2016-07-07 (×3): qty 2

## 2016-07-07 MED ORDER — ENOXAPARIN SODIUM 40 MG/0.4ML ~~LOC~~ SOLN
40.0000 mg | SUBCUTANEOUS | Status: DC
  Administered 2016-07-07 – 2016-07-08 (×2): 40 mg via SUBCUTANEOUS
  Filled 2016-07-07 (×2): qty 0.4

## 2016-07-07 MED ORDER — ASPIRIN 81 MG PO CHEW
81.0000 mg | CHEWABLE_TABLET | Freq: Every day | ORAL | Status: DC
  Administered 2016-07-08 – 2016-07-10 (×3): 81 mg via ORAL
  Filled 2016-07-07 (×3): qty 1

## 2016-07-07 MED ORDER — HYDRALAZINE HCL 20 MG/ML IJ SOLN: 10.0000 mg | Freq: Four times a day (QID) | INTRAMUSCULAR | Status: DC | PRN

## 2016-07-07 MED ORDER — ONDANSETRON HCL 4 MG/2ML IJ SOLN: 4.0000 mg | Freq: Four times a day (QID) | INTRAMUSCULAR | Status: DC | PRN

## 2016-07-07 MED ORDER — MORPHINE SULFATE (PF) 2 MG/ML IV SOLN
2.0000 mg | Freq: Once | INTRAVENOUS | Status: DC
Start: 2016-07-07 — End: 2016-07-07

## 2016-07-07 MED ORDER — LEVOTHYROXINE SODIUM 25 MCG PO TABS
25.0000 ug | ORAL_TABLET | Freq: Every day | ORAL | Status: DC
  Administered 2016-07-08 – 2016-07-10 (×3): 25 ug via ORAL
  Filled 2016-07-07 (×3): qty 1

## 2016-07-07 MED ORDER — IBUPROFEN 400 MG PO TABS
400.0000 mg | ORAL_TABLET | Freq: Four times a day (QID) | ORAL | Status: DC | PRN
  Administered 2016-07-07 – 2016-07-10 (×7): 400 mg via ORAL
  Filled 2016-07-07 (×8): qty 1

## 2016-07-07 MED ORDER — PROMETHAZINE HCL 25 MG/ML IJ SOLN: 12.5000 mg | Freq: Once | INTRAMUSCULAR | Status: DC

## 2016-07-07 MED ORDER — NITROGLYCERIN 0.4 MG SL SUBL: 0.4000 mg | SUBLINGUAL_TABLET | SUBLINGUAL | Status: DC | PRN

## 2016-07-07 MED ORDER — PANTOPRAZOLE SODIUM 40 MG PO TBEC
40.0000 mg | DELAYED_RELEASE_TABLET | Freq: Every day | ORAL | Status: DC
  Administered 2016-07-08 – 2016-07-10 (×3): 40 mg via ORAL
  Filled 2016-07-07 (×3): qty 1

## 2016-07-07 MED ORDER — LISINOPRIL 20 MG PO TABS
40.0000 mg | ORAL_TABLET | Freq: Every day | ORAL | Status: DC
  Administered 2016-07-08 – 2016-07-10 (×3): 40 mg via ORAL
  Filled 2016-07-07 (×3): qty 2

## 2016-07-07 MED ORDER — PROMETHAZINE HCL 25 MG/ML IJ SOLN
INTRAMUSCULAR | Status: AC
  Administered 2016-07-07: 12.5 mg via INTRAMUSCULAR
  Filled 2016-07-07: qty 1

## 2016-07-07 MED ORDER — HYDROCODONE-ACETAMINOPHEN 5-325 MG PO TABS
0.5000 | ORAL_TABLET | Freq: Four times a day (QID) | ORAL | Status: DC | PRN
  Filled 2016-07-07: qty 1

## 2016-07-07 MED ORDER — ONDANSETRON HCL 4 MG PO TABS: 4.0000 mg | ORAL_TABLET | Freq: Four times a day (QID) | ORAL | Status: DC | PRN

## 2016-07-07 MED ORDER — CARVEDILOL 3.125 MG PO TABS
3.1250 mg | ORAL_TABLET | Freq: Two times a day (BID) | ORAL | Status: DC
  Administered 2016-07-08 – 2016-07-10 (×5): 3.125 mg via ORAL
  Filled 2016-07-07 (×4): qty 1

## 2016-07-07 MED ORDER — ALPRAZOLAM 0.5 MG PO TABS
0.2500 mg | ORAL_TABLET | Freq: Once | ORAL | Status: AC
Start: 2016-07-07 — End: 2016-07-07
  Administered 2016-07-07: 0.25 mg via ORAL

## 2016-07-07 MED ORDER — SODIUM CHLORIDE 0.9 % IV BOLUS (SEPSIS)
250.0000 mL | Freq: Once | INTRAVENOUS | Status: AC
  Administered 2016-07-07: 250 mL via INTRAVENOUS

## 2016-07-07 MED ORDER — ATORVASTATIN CALCIUM 20 MG PO TABS
20.0000 mg | ORAL_TABLET | Freq: Every day | ORAL | Status: DC
  Administered 2016-07-08 – 2016-07-09 (×2): 20 mg via ORAL
  Filled 2016-07-07 (×4): qty 1

## 2016-07-07 MED ORDER — MORPHINE SULFATE (PF) 2 MG/ML IV SOLN
2.0000 mg | Freq: Once | INTRAVENOUS | Status: AC
  Administered 2016-07-07: 2 mg via INTRAMUSCULAR

## 2016-07-07 NOTE — ED Triage Notes (Signed)
Patient presents to the ED with elbow pain and deformity post fall.  Patient states she tripped on a spot that had difference in elevation in the cement.  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  Patient states, "I fell right on my elbow and I'm sure I broke it."  Patient also reports some left wrist pain but she is using her left wrist freely.  Patient denies hitting her head.

## 2016-07-07 NOTE — ED Provider Notes (Signed)
[facility] Emergency Department Provider Note  ____________________________________________  Time seen: Approximately 8:39 PM  I have reviewed the triage vital signs and the nursing notes.   HISTORY  Chief Complaint Elbow Pain    HPI Teresa Franklin is a 83 y.o. female presenting to the emergency department via ems with acute 10/10 right shoulder pain, bilateral wrist pain and right elbow pain after falling while playing at the pool earlier today. Patient states that she was not able to ambulate after the incident. She denies hitting her head or losing consciousness. 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 Patient and her husband do not have family that lives nearby. Patient has experienced bilateral upper extremity avoidance. Patient is right-handed. No alleviating measures have been attempted.    Past Medical History:  Diagnosis Date  . Anxiety   . AV block    a. 08/2015 in setting of NSTEMI and RPAV intervention-->required temp wire but not PPM.  . Back pain   . Chronic diastolic CHF (congestive heart failure) ([place])    a. 08/2015  Echo: EF 55-60%, no rwma, mod MR, mildly dil LA/RA, mod TR, PASP 6mmHg.  Marland Kitchen Coronary artery disease    a. 08/2015 NSTEMI/PCI: LM nl, LAD 75p (med rx), 20 diffuse, 50d, LCX nl, OM1/2 nl, RCA 30m, RPAV 100 (2.25x12 Promus Premier DES) - complicated by 2:1 HB req temp wire post-PCI.  Marland Kitchen Diverticulitis   . Generalized anxiety disorder   . Hepatic steatosis   . HLD (hyperlipidemia)   . Hypertension   . Hypokalemia   . Hypomagnesemia   . Hypothyroidism   . OA (osteoarthritis)   . Osteopenia   . Sleep apnea   . Stroke [facility])    a. 08/2015 Right PCA territory infarct/temoral lobe cerebral infarction-->MRA showed severe flow liminting stenosis of R P2 cerebral artery;  b. 08/2015 Carotid U/S: 1-39% bilat ICA stenosis.  . Subdural hemorrhage ([place])    a. 05/2013 in setting of  fall.  . Thrombocytopenia ([place])   . Weakness     Patient Active Problem List   Diagnosis Date Noted  . Elbow fracture, right 07/07/2016  . Thrombocytopenia ([place]) 11/12/2015  . Diastolic CHF, acute on chronic ([place]) 09/17/2015  . SOB (shortness of breath)   . UTI (urinary tract infection) 09/16/2015  . Acute right MCA stroke ([place])   . Fall   . Hypokalemia 09/13/2015  . Hypophosphatemia 09/12/2015  . Encounter for intubation   . Acute respiratory failure ([place])   . Complete heart block ([place]) 09/08/2015  . Acute blood loss anemia -thought to be related to in her muscular injection of Phenergan 09/08/2015  . Acute respiratory failure with hypoxia ([place]) 09/08/2015  . Heart block AV second degree   . Essential hypertension   . Arterial hypotension   . Cardiogenic shock ([place])   . NSTEMI (non-ST elevated myocardial infarction) ([place]) 09/06/2015  . Subdural hemorrhage ([facility]) -history of     Class: History of  . Hyperlipidemia with target LDL less than 70   . Hepatic steatosis     Past Surgical History:  Procedure Laterality Date  . CARDIAC CATHETERIZATION N/A 09/08/2015   Procedure: Temporary Pacemaker;  Surgeon: Leonie Man, MD;  Location: [place] CV LAB;  Service: Cardiovascular;  Laterality: N/A;  . CARDIAC CATHETERIZATION N/A 09/08/2015   Procedure: Left Heart Cath and Coronary Angiography;  Surgeon: Leonie Man, MD;  Location: [place] CV LAB;  Service: Cardiovascular;  Laterality: N/A;  . CARDIAC CATHETERIZATION N/A 09/08/2015   Procedure: Coronary Stent Intervention;  Surgeon: Leonie Man, MD;  Location: [place] CV LAB;  Service: Cardiovascular;  Laterality: N/A;  . CARDIAC CATHETERIZATION  09/08/2015   Procedure: Central Line Insertion;  Surgeon: Leonie Man, MD;  Location: [place] CV LAB;  Service: Cardiovascular;;  . CARDIAC CATHETERIZATION  09/08/2015   Procedure: Arterial Line Insertion;  Surgeon: Leonie Man, MD;  Location: [place] CV LAB;   Service: Cardiovascular;;  . CARDIAC CATHETERIZATION N/A 09/09/2015   Procedure: Temporary Wire;  Surgeon: Lorretta Harp, MD;  Location: [place] CV LAB;  Service: Cardiovascular;  Laterality: N/A;    Prior to Admission medications   Medication Sig Start Date End Date Taking? Authorizing Provider  acetaminophen (TYLENOL) 325 MG tablet Take 650 mg by mouth every 6 (six) hours as needed for mild pain, fever or headache.    Yes Historical Provider, MD  ALPRAZolam Duanne Moron) 0.25 MG tablet Take 0.25 mg by mouth at bedtime as needed for anxiety.   Yes Historical Provider, MD  aspirin 81 MG chewable tablet Chew 1 tablet (81 mg total) by mouth daily. 09/13/15  Yes Brittainy Erie Noe, PA-C  atorvastatin (LIPITOR) 20 MG tablet Take 1 tablet (20 mg total) by mouth daily. 09/30/15  Yes [name], MD  calcium-vitamin D (OSCAL WITH D) 500-200 MG-UNIT tablet Take 1 tablet by mouth daily with breakfast.    Yes Historical Provider, MD  carvedilol (COREG) 3.125 MG tablet Take 3.125 mg by mouth 2 (two) times daily with a meal.  11/11/15  Yes Historical Provider, MD  clopidogrel (PLAVIX) 75 MG tablet Take 1 tablet (75 mg total) by mouth daily with breakfast. 09/13/15  Yes Brittainy Erie Noe, PA-C  fluocinonide (LIDEX) 0.05 % external solution  10/28/15  Yes Historical Provider, MD  folic acid-pyridoxine-cyancobalamin (FOLTX) 2.5-25-2 MG TABS tablet Take 1 tablet by mouth daily.  08/24/15 02/23/17 Yes Historical Provider, MD  furosemide (LASIX) 20 MG tablet Take 1 tablet (20 mg total) by mouth daily. 01/31/16  Yes [name], MD  KLOR-CON M20 20 MEQ tablet Take 20 mEq by mouth daily.  10/21/15  Yes Historical Provider, MD  levothyroxine (SYNTHROID, LEVOTHROID) 25 MCG tablet Take 25 mcg by mouth daily before breakfast.    Yes Historical Provider, MD  lisinopril (PRINIVIL,ZESTRIL) 40 MG tablet Take 40 mg by mouth daily. 08/21/15 08/20/16 Yes Historical Provider, MD  magnesium oxide (MAG-OX) 400 MG tablet Take 400  mg by mouth daily.   Yes Historical Provider, MD  Multiple Vitamins-Minerals (MULTIVITAMIN GUMMIES ADULT) CHEW Chew 2 each by mouth at bedtime.   Yes Historical Provider, MD  Multiple Vitamins-Minerals (PRESERVISION AREDS 2) CAPS Take 1 capsule by mouth daily.   Yes Historical Provider, MD  nitroGLYCERIN (NITROSTAT) 0.4 MG SL tablet Place 1 tablet (0.4 mg total) under the tongue every 5 (five) minutes x 3 doses as needed for chest pain. 09/13/15  Yes Brittainy Erie Noe, PA-C  omeprazole (PRILOSEC) 20 MG capsule Take 20 mg by mouth daily before breakfast.    Yes Historical Provider, MD  senna-docusate (SENOKOT-S) 8.6-50 MG tablet Take 1 tablet by mouth as needed.    Yes Historical Provider, MD  sodium chloride (OCEAN) 0.65 % SOLN nasal spray Place 1 spray into both nostrils as needed for congestion. 09/18/15  Yes Maryann Mikhail, DO  simethicone (GAS-X) 80 MG chewable tablet Chew 1 tablet (80 mg total) by mouth 4 (four)  times daily as needed for flatulence. Patient not taking: Reported on 07/07/2016 09/05/15 09/04/16  Eula Listen, MD    Allergies Codeine; Flomax [tamsulosin]; Norco [hydrocodone-acetaminophen]; and Sulfa antibiotics  Family History  Problem Relation Age of Onset  . Hypertension Mother   . Hyperlipidemia Mother   . Stroke Mother   . Diabetes Sister   . Hypertension Sister   . Hyperlipidemia Sister   . Bipolar disorder Sister   . Hyperlipidemia Sister   . Stroke Sister   . Prostate cancer Brother   . Breast cancer Neg Hx     Social History Social History  Substance Use Topics  . Smoking status: Never Smoker  . Smokeless tobacco: Never Used  . Alcohol use No     Review of Systems  Constitutional: No fever/chills Eyes: No visual changes. No discharge ENT: No upper respiratory complaints. Cardiovascular: no chest pain. Respiratory: no cough. No SOB. Gastrointestinal: No abdominal pain.  No nausea, no vomiting.  No diarrhea.  No  constipation. Musculoskeletal: Patient has bilateral wrist pain, right elbow pain and right shoulder pain.  Skin: Negative for rash, abrasions, lacerations, ecchymosis. Neurological: Negative for headaches, focal weakness or numbness. ____________________________________________   PHYSICAL EXAM:  VITAL SIGNS: ED Triage Vitals  Enc Vitals Group     BP 07/07/16 1609 (!) 193/90     Pulse Rate 07/07/16 1609 96     Resp 07/07/16 1609 18     Temp 07/07/16 1609 98.3 F (36.8 C)     Temp Source 07/07/16 1609 Oral     SpO2 07/07/16 1609 98 %     Weight 07/07/16 1610 125 lb 8 oz (56.9 kg)     Height 07/07/16 1610 5\' 2"  (1.575 m)     Head Circumference --      Peak Flow --      Pain Score 07/07/16 1608 8     Pain Loc --      Pain Edu? --      Excl. in [place]? --      Constitutional: Alert and oriented. Well appearing and in no acute distress. Eyes: Conjunctivae are normal. PERRL. EOMI. Head: Atraumatic. Hematological/Lymphatic/Immunilogical: No cervical lymphadenopathy. Cardiovascular: Normal rate, regular rhythm. Normal S1 and S2.  Good peripheral circulation. Respiratory: Normal respiratory effort without tachypnea or retractions. Lungs CTAB. Good air entry to the bases with no decreased or absent breath sounds. Musculoskeletal: Patient has edema and tenderness of the right shoulder, right elbow and left wrist. Patient is unable to demonstrate range of motion secondary to pain. Patient is able to move all 10 fingers. Palable radial and ulnar pulses bilaterally and symmetrically. Neurologic:  Normal speech and language. No gross focal neurologic deficits are appreciated.  Skin: Patient has abrasions of the right upper arm.  Psychiatric: Mood and affect are normal. Speech and behavior are normal. Patient exhibits appropriate insight and judgement.   ____________________________________________   LABS (all labs ordered are listed, but only abnormal results are displayed)  Labs Reviewed   CBC WITH DIFFERENTIAL/PLATELET - Abnormal; Notable for the following:       Result Value   Platelets 102 (*)    Neutro Abs 8.0 (*)    Lymphs Abs 0.5 (*)    All other components within normal limits  BASIC METABOLIC PANEL - Abnormal; Notable for the following:    Potassium 3.3 (*)    Glucose, Bld 143 (*)    All other components within normal limits   ____________________________________________  EKG   ____________________________________________  RADIOLOGY I, Lannie Fields, personally viewed and evaluated these images (plain radiographs) as part of my medical decision making, as well as reviewing the written report by the radiologist.  Dg Shoulder Right  Result Date: 07/07/2016 CLINICAL DATA:  Status post fall. EXAM: RIGHT SHOULDER - 2+ VIEW COMPARISON:  None in PACs FINDINGS: The bones are subjectively osteopenic. There is an acute mildly angulated fracture involving the neck of the right humerus. There is pre-existing chronic deformity of the humeral head and the glenoid. The observed portions of the right clavicle and upper right ribs are intact. The [facility] joint appears intact. IMPRESSION: Acute mildly angulated fracture of the neck of the right humerus. 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 Electronically Signed   By: David  Martinique M.D.   On: 07/07/2016 17:11   Dg Elbow Complete Right  Result Date: 07/07/2016 CLINICAL DATA:  Elbow pain and deformity post fall. EXAM: RIGHT ELBOW - COMPLETE 3+ VIEW COMPARISON:  None. FINDINGS: Examination demonstrates a displaced anterior posterior fat pad. There is a complete transverse displaced fracture through the proximal ulna with mild comminution at the level of the elbow. Displaced fracture of the radial head likely with extension to the articular surface. IMPRESSION: Displaced slightly comminuted proximal ulnar fracture. Displaced radial head fracture likely with extension to the articular surface.  Electronically Signed   By: Marin Olp M.D.   On: 07/07/2016 17:23   Dg Wrist Complete Left  Result Date: 07/07/2016 CLINICAL DATA:  Elbow pain and deformity post fall. Some left wrist pain. EXAM: LEFT WRIST - COMPLETE 3+ VIEW COMPARISON:  None. FINDINGS: Exam demonstrates ulnar plus variance. Mild degenerative change of the radiocarpal joint and first carpometacarpal joints. Subtle displaced fragment posteriorly on the lateral film suggesting a triquetrum bone fracture. IMPRESSION: Findings suggesting a chip fracture of the triquetrum posteriorly. Electronically Signed   By: Marin Olp M.D.   On: 07/07/2016 17:25   Dg Wrist Complete Right  Result Date: 07/07/2016 CLINICAL DATA:  Right wrist pain due to a fall today. Initial encounter. EXAM: RIGHT WRIST - COMPLETE 3+ VIEW COMPARISON:  None. FINDINGS: No acute bony or joint abnormality is identified. The patient appears to have a remote healed fracture of the fifth metacarpal. There is some first CMC osteoarthritis. Mild triangular fibrocartilage chondrocalcinosis is identified. IMPRESSION: No acute abnormality. Electronically Signed   By: Inge Rise M.D.   On: 07/07/2016 17:10    ____________________________________________    PROCEDURES  Procedure(s) performed:    Procedures    Medications  lisinopril (PRINIVIL,ZESTRIL) tablet 40 mg (not administered)  senna-docusate (Senokot-S) tablet 2 tablet (not administered)  magnesium oxide (MAG-OX) tablet 400 mg (not administered)  carvedilol (COREG) tablet 3.125 mg (not administered)  ALPRAZolam (XANAX) tablet 0.25 mg (not administered)  atorvastatin (LIPITOR) tablet 20 mg (not administered)  aspirin chewable tablet 81 mg (not administered)  nitroGLYCERIN (NITROSTAT) SL tablet 0.4 mg (not administered)  levothyroxine (SYNTHROID, LEVOTHROID) tablet 25 mcg (not administered)  pantoprazole (PROTONIX) EC tablet 40 mg (not administered)  hydrALAZINE (APRESOLINE) injection 10 mg (not  administered)  acetaminophen (TYLENOL) tablet 650 mg (not administered)    Or  acetaminophen (TYLENOL) suppository 650 mg (not administered)  ibuprofen (ADVIL,MOTRIN) tablet 400 mg (400 mg Oral Given 07/07/16 2045)  polyethylene glycol (MIRALAX / GLYCOLAX) packet 17 g (not administered)  ondansetron (ZOFRAN) tablet 4 mg (not administered)    Or  ondansetron (ZOFRAN) injection 4 mg (not administered)  HYDROcodone-acetaminophen (NORCO/VICODIN) 5-325 MG per tablet  0.5 tablet (not administered)  enoxaparin (LOVENOX) injection 40 mg (not administered)  ALPRAZolam (XANAX) tablet 0.25 mg (0.25 mg Oral Given 07/07/16 1619)  morphine 2 MG/ML injection 2 mg (2 mg Intramuscular Given 07/07/16 1730)  promethazine (PHENERGAN) injection 12.5 mg (12.5 mg Intramuscular Given 07/07/16 1730)     ____________________________________________   INITIAL IMPRESSION / ASSESSMENT AND PLAN / ED COURSE  Pertinent labs & imaging results that were available during my care of the patient were reviewed by me and considered in my medical decision making (see chart for details).  Review of the [place] CSRS was performed in accordance of the [place] prior to dispensing any controlled drugs.     Assessment and plan: Olecranon fracture Proximal humerus fracture Triquetrum fracture Patient presents to the emergency department with right shoulder, right elbow and bilateral wrist pain. X-ray examination conducted in the emergency department reveals a comminuted olecranon fracture, right as well as an angulated fracture of the proximal humerus, right. Patient also has a chipped fracture of the triquetrum, left. Dr. Mack Guise was consulted regarding patient's case. Dr. Mack Guise advised advised splinting in a sling for immobilization. Due to the bilateral splinting of the upper extremities, internal medicine was consulted regarding possible admission due to bilateral hand fractures and the need for pain management. Internal medicine  accepted patient for further care. Patient's case was reviewed with supervising physician, Dr. Archie Balboa, who agrees with plan of care. ____________________________________________  FINAL CLINICAL IMPRESSION(S) / ED DIAGNOSES  Final diagnoses:  None      NEW MEDICATIONS STARTED DURING THIS VISIT:  Current Discharge Medication List          This chart was dictated using voice recognition software/Dragon. Despite best efforts to proofread, errors can occur which can change the meaning. Any change was purely unintentional.    Lannie Fields, PA-C 07/07/16 2054    Nance Pear, MD 07/08/16 [phone]

## 2016-07-07 NOTE — ED Notes (Signed)
Volar splint applied to left wrist good PMS before and after splinting

## 2016-07-07 NOTE — H&P (Signed)
[facility] NAME: Teresa Franklin    MR#:  [id number]  DATE OF BIRTH:  09-22-1930  DATE OF ADMISSION:  07/07/2016  PRIMARY CARE PHYSICIAN: [name], MD   REQUESTING/REFERRING PHYSICIAN: Dr. Archie Balboa  CHIEF COMPLAINT:   Chief Complaint  Patient presents with  . Elbow Pain    HISTORY OF PRESENT ILLNESS:  Teresa Franklin  is a 83 y.o. female with a known history of CAD, hypertension, and gait abnormalities walks with a walker presented to the emergency room after she stepped on uneven surface on cement and fell. She mainly fell on the right elbow but try to stop the fall with her left arm. She has right radius, ulnar fractures. Also left triquetrum fracture. Plan is for outpatient orthopedic follow-up. But patient is unable to ambulate due to using a walker at baseline. She is being admitted for PT evaluation and rehabilitation placement along with orthopedic evaluation. She will be admitted under observation.  PAST MEDICAL HISTORY:   Past Medical History:  Diagnosis Date  . Anxiety   . AV block    a. 08/2015 in setting of NSTEMI and RPAV intervention-->required temp wire but not PPM.  . Back pain   . Chronic diastolic CHF (congestive heart failure) ([place])    a. 08/2015  Echo: EF 55-60%, no rwma, mod MR, mildly dil LA/RA, mod TR, PASP 106mmHg.  Marland Kitchen Coronary artery disease    a. 08/2015 NSTEMI/PCI: LM nl, LAD 75p (med rx), 20 diffuse, 50d, LCX nl, OM1/2 nl, RCA 36m, RPAV 100 (2.25x12 Promus Premier DES) - complicated by 2:1 HB req temp wire post-PCI.  Marland Kitchen Diverticulitis   . Generalized anxiety disorder   . Hepatic steatosis   . HLD (hyperlipidemia)   . Hypertension   . Hypokalemia   . Hypomagnesemia   . Hypothyroidism   . OA (osteoarthritis)   . Osteopenia   . Sleep apnea   . Stroke [facility])    a. 08/2015 Right PCA territory infarct/temoral lobe cerebral infarction-->MRA showed severe flow liminting stenosis of R P2 cerebral artery;  b.  08/2015 Carotid U/S: 1-39% bilat ICA stenosis.  . Subdural hemorrhage ([place])    a. 05/2013 in setting of fall.  . Thrombocytopenia ([place])   . Weakness     PAST SURGICAL HISTORY:   Past Surgical History:  Procedure Laterality Date  . CARDIAC CATHETERIZATION N/A 09/08/2015   Procedure: Temporary Pacemaker;  Surgeon: Leonie Man, MD;  Location: [place] [facility];  Service: Cardiovascular;  Laterality: N/A;  . CARDIAC CATHETERIZATION N/A 09/08/2015   Procedure: Left Heart Cath and Coronary Angiography;  Surgeon: Leonie Man, MD;  Location: [place] [facility];  Service: Cardiovascular;  Laterality: N/A;  . CARDIAC CATHETERIZATION N/A 09/08/2015   Procedure: Coronary Stent Intervention;  Surgeon: Leonie Man, MD;  Location: [place] [facility];  Service: Cardiovascular;  Laterality: N/A;  . CARDIAC CATHETERIZATION  09/08/2015   Procedure: Central Line Insertion;  Surgeon: Leonie Man, MD;  Location: [place] [facility];  Service: Cardiovascular;;  . CARDIAC CATHETERIZATION  09/08/2015   Procedure: Arterial Line Insertion;  Surgeon: Leonie Man, MD;  Location: [place] [facility];  Service: Cardiovascular;;  . CARDIAC CATHETERIZATION N/A 09/09/2015   Procedure: Temporary Wire;  Surgeon: Lorretta Harp, MD;  Location: [place] [facility];  Service: Cardiovascular;  Laterality: N/A;    SOCIAL HISTORY:   Social History  Substance Use Topics  . Smoking status: Never  Smoker  . Smokeless tobacco: Never Used  . Alcohol use No    FAMILY HISTORY:   Family History  Problem Relation Age of Onset  . Hypertension Mother   . Hyperlipidemia Mother   . Stroke Mother   . Diabetes Sister   . Hypertension Sister   . Hyperlipidemia Sister   . Bipolar disorder Sister   . Hyperlipidemia Sister   . Stroke Sister   . Prostate cancer Brother   . Breast cancer Neg Hx     DRUG ALLERGIES:   Allergies  Allergen Reactions  . Codeine Nausea And Vomiting  . Flomax [Tamsulosin] Other (See  Comments)    Pt states that this medication gave her a kidney infection.    Lebron Quam [Hydrocodone-Acetaminophen] Nausea And Vomiting  . Sulfa Antibiotics Nausea And Vomiting    REVIEW OF SYSTEMS:   Review of Systems  Constitutional: Positive for malaise/fatigue. Negative for chills, fever and weight loss.  HENT: Negative for hearing loss and nosebleeds.   Eyes: Negative for blurred vision, double vision and pain.  Respiratory: Negative for cough, hemoptysis, sputum production, shortness of breath and wheezing.   Cardiovascular: Negative for chest pain, palpitations, orthopnea and leg swelling.  Gastrointestinal: Negative for abdominal pain, constipation, diarrhea, nausea and vomiting.  Genitourinary: Negative for dysuria and hematuria.  Musculoskeletal: Positive for falls and joint pain. Negative for back pain and myalgias.  Skin: Negative for rash.  Neurological: Negative for dizziness, tremors, sensory change, speech change, focal weakness, seizures and headaches.  Endo/Heme/Allergies: Does not bruise/bleed easily.  Psychiatric/Behavioral: Negative for depression and memory loss. The patient is not nervous/anxious.     MEDICATIONS AT HOME:   Prior to Admission medications   Medication Sig Start Date End Date Taking? Authorizing Provider  acetaminophen (TYLENOL) 325 MG tablet Take 650 mg by mouth every 6 (six) hours as needed for mild pain, fever or headache.     Historical Provider, MD  ALPRAZolam Duanne Moron) 0.25 MG tablet Take 0.25 mg by mouth at bedtime as needed for anxiety.    Historical Provider, MD  aspirin 81 MG chewable tablet Chew 1 tablet (81 mg total) by mouth daily. 09/13/15   Brittainy Erie Noe, PA-C  atorvastatin (LIPITOR) 20 MG tablet Take 1 tablet (20 mg total) by mouth daily. 09/30/15   [name], MD  calcium-vitamin D (OSCAL WITH D) 500-200 MG-UNIT tablet Take 1 tablet by mouth 2 (two) times daily.    Historical Provider, MD  carvedilol (COREG) 3.125 MG tablet  Take 3.125 mg by mouth 2 (two) times daily with a meal.  11/11/15   Historical Provider, MD  clopidogrel (PLAVIX) 75 MG tablet Take 1 tablet (75 mg total) by mouth daily with breakfast. 09/13/15   Brittainy Erie Noe, PA-C  fluocinonide (LIDEX) 0.05 % external solution  10/28/15   Historical Provider, MD  folic acid-pyridoxine-cyancobalamin (FOLTX) 2.5-25-2 MG TABS tablet Take by mouth. 08/24/15 02/23/17  Historical Provider, MD  furosemide (LASIX) 20 MG tablet Take 1 tablet (20 mg total) by mouth daily. 01/31/16   [name], MD  KLOR-CON M20 20 MEQ tablet Take 20 mEq by mouth once.  10/21/15   Historical Provider, MD  levothyroxine (SYNTHROID, LEVOTHROID) 25 MCG tablet Take 25 mcg by mouth daily before breakfast.     Historical Provider, MD  lisinopril (PRINIVIL,ZESTRIL) 40 MG tablet Take 40 mg by mouth daily. 08/21/15 08/20/16  Historical Provider, MD  magnesium oxide (MAG-OX) 400 MG tablet Take 400 mg by mouth daily.  Historical Provider, MD  Multiple Vitamins-Minerals (MULTIVITAMIN GUMMIES ADULT) CHEW Chew 2 each by mouth at bedtime.    Historical Provider, MD  Multiple Vitamins-Minerals (PRESERVISION AREDS 2) CAPS Take 1 capsule by mouth daily.    Historical Provider, MD  nitroGLYCERIN (NITROSTAT) 0.4 MG SL tablet Place 1 tablet (0.4 mg total) under the tongue every 5 (five) minutes x 3 doses as needed for chest pain. 09/13/15   Brittainy Erie Noe, PA-C  omeprazole (PRILOSEC) 20 MG capsule Take 20 mg by mouth daily before breakfast.     Historical Provider, MD  senna-docusate (SENOKOT-S) 8.6-50 MG tablet Take by mouth.    Historical Provider, MD  simethicone (GAS-X) 80 MG chewable tablet Chew 1 tablet (80 mg total) by mouth 4 (four) times daily as needed for flatulence. 09/05/15 09/04/16  Anne-Caroline Mariea Clonts, MD  sodium chloride (OCEAN) 0.65 % SOLN nasal spray Place 1 spray into both nostrils as needed for congestion. 09/18/15   Maryann Mikhail, DO     VITAL SIGNS:  Blood pressure (!) 193/90,  pulse 96, temperature 98.3 F (36.8 C), temperature source Oral, resp. rate 18, height 5\' 2"  (1.575 m), weight 56.9 kg (125 lb 8 oz), SpO2 98 %.  PHYSICAL EXAMINATION:  Physical Exam  GENERAL:  83 y.o.-year-old patient lying in the bed with no acute distress.  EYES: Pupils equal, round, reactive to light and accommodation. No scleral icterus. Extraocular muscles intact.  HEENT: Head atraumatic, normocephalic. Oropharynx and nasopharynx clear. No oropharyngeal erythema, moist oral mucosa  NECK:  Supple, no jugular venous distention. No thyroid enlargement, no tenderness.  LUNGS: Normal breath sounds bilaterally, no wheezing, rales, rhonchi. No use of accessory muscles of respiration.  CARDIOVASCULAR: S1, S2 normal. No murmurs, rubs, or gallops.  ABDOMEN: Soft, nontender, nondistended. Bowel sounds present. No organomegaly or mass.  EXTREMITIES: Right arm in a sling. Left wrist splint. NEUROLOGIC: Cranial nerves II through XII are intact. No focal Motor or sensory deficits appreciated b/l PSYCHIATRIC: The patient is alert and oriented x 3. Good affect.  SKIN: No obvious rash, lesion, or ulcer.   LABORATORY PANEL:   CBC No results for input(s): WBC, HGB, HCT, PLT in the last 168 hours. ------------------------------------------------------------------------------------------------------------------  Chemistries  No results for input(s): NA, K, CL, CO2, GLUCOSE, BUN, CREATININE, CALCIUM, MG, AST, ALT, ALKPHOS, BILITOT in the last 168 hours.  Invalid input(s): GFRCGP ------------------------------------------------------------------------------------------------------------------  Cardiac Enzymes No results for input(s): TROPONINI in the last 168 hours. ------------------------------------------------------------------------------------------------------------------  RADIOLOGY:  Dg Shoulder Right  Result Date: 07/07/2016 CLINICAL DATA:  Status post fall. EXAM: RIGHT SHOULDER - 2+ VIEW  COMPARISON:  None in PACs FINDINGS: The bones are subjectively osteopenic. There is an acute mildly angulated fracture involving the neck of the right humerus. There is pre-existing chronic deformity of the humeral head and the glenoid. The observed portions of the right clavicle and upper right ribs are intact. The [facility] joint appears intact. IMPRESSION: Acute mildly angulated fracture of the neck of the right humerus. 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 Electronically Signed   By: David  Martinique M.D.   On: 07/07/2016 17:11   Dg Elbow Complete Right  Result Date: 07/07/2016 CLINICAL DATA:  Elbow pain and deformity post fall. EXAM: RIGHT ELBOW - COMPLETE 3+ VIEW COMPARISON:  None. FINDINGS: Examination demonstrates a displaced anterior posterior fat pad. There is a complete transverse displaced fracture through the proximal ulna with mild comminution at the level of the elbow. Displaced fracture of the radial head likely  with extension to the articular surface. IMPRESSION: Displaced slightly comminuted proximal ulnar fracture. Displaced radial head fracture likely with extension to the articular surface. Electronically Signed   By: Marin Olp M.D.   On: 07/07/2016 17:23   Dg Wrist Complete Left  Result Date: 07/07/2016 CLINICAL DATA:  Elbow pain and deformity post fall. Some left wrist pain. EXAM: LEFT WRIST - COMPLETE 3+ VIEW COMPARISON:  None. FINDINGS: Exam demonstrates ulnar plus variance. Mild degenerative change of the radiocarpal joint and first carpometacarpal joints. Subtle displaced fragment posteriorly on the lateral film suggesting a triquetrum bone fracture. IMPRESSION: Findings suggesting a chip fracture of the triquetrum posteriorly. Electronically Signed   By: Marin Olp M.D.   On: 07/07/2016 17:25   Dg Wrist Complete Right  Result Date: 07/07/2016 CLINICAL DATA:  Right wrist pain due to a fall today. Initial encounter. EXAM: RIGHT WRIST -  COMPLETE 3+ VIEW COMPARISON:  None. FINDINGS: No acute bony or joint abnormality is identified. The patient appears to have a remote healed fracture of the fifth metacarpal. There is some first CMC osteoarthritis. Mild triangular fibrocartilage chondrocalcinosis is identified. IMPRESSION: No acute abnormality. Electronically Signed   By: Inge Rise M.D.   On: 07/07/2016 17:10     IMPRESSION AND PLAN:   * Right radial, ulnar fractures and left  triquetrum fractures Patient unable to ambulate and use her walker. Being admitted for PT, OT, rehabilitation placement. Added pain medications. She does have allergy to codeine which is nausea. So I have added Tylenol for mild pain, ibuprofen for moderate. Half of Norco for severe pain. She did tolerate morphine in the emergency room. With Phenergan. Also request orthopedic evaluation in the hospital but she would likely need outpatient surgery.  * Hypertension. Elevated in the emergency room likely due to pain. We will continue her home medications. Added IV when necessary medications.  * History of CAD. Recent stress test was normal.  * DVT prophylaxis with Lovenox     All the records are reviewed and case discussed with ED provider. Management plans discussed with the patient, family and they are in agreement.  CODE STATUS: FULL CODE  TOTAL TIME TAKING CARE OF THIS PATIENT: 40 minutes.   Hillary Bow R M.D on 07/07/2016 at 7:18 PM  Between 7am to 6pm - Pager - [phone]  After 6pm go to [email or web address] - password [email or web address] [place] [facility]  Office  [phone]  CC: Primary care physician; [name], MD  Note: This dictation was prepared with Dragon dictation along with smaller phrase technology. Any transcriptional errors that result from this process are unintentional.

## 2016-07-08 ENCOUNTER — Observation Stay: Payer: Medicare Other

## 2016-07-08 MED ORDER — CLOPIDOGREL BISULFATE 75 MG PO TABS
75.0000 mg | ORAL_TABLET | Freq: Every day | ORAL | Status: DC
  Administered 2016-07-09 – 2016-07-10 (×2): 75 mg via ORAL
  Filled 2016-07-08 (×2): qty 1

## 2016-07-08 MED ORDER — PROMETHAZINE HCL 25 MG/ML IJ SOLN
12.5000 mg | Freq: Four times a day (QID) | INTRAMUSCULAR | Status: DC | PRN
  Administered 2016-07-08: 12.5 mg via INTRAVENOUS
  Filled 2016-07-08 (×2): qty 1

## 2016-07-08 MED ORDER — MORPHINE SULFATE (PF) 2 MG/ML IV SOLN
2.0000 mg | INTRAVENOUS | Status: DC | PRN
  Administered 2016-07-08 – 2016-07-10 (×4): 2 mg via INTRAVENOUS
  Filled 2016-07-08 (×5): qty 1

## 2016-07-08 NOTE — Care Management Obs Status (Signed)
[place] NOTIFICATION   Patient Details  Name: Teresa Franklin MRN: [id number] Date of Birth: 04-Sep-1930   Medicare Observation Status Notification Given:  Yes    Jolly Mango, RN 07/08/2016, 9:53 AM

## 2016-07-08 NOTE — Clinical Social Work Note (Signed)
Clinical Social Work Assessment  Patient Details  Name: Teresa Franklin MRN: [id number] Date of Birth: [birth year]  Date of referral:  07/08/16               Reason for consult:  Facility Placement                Permission sought to share information with:  Chartered certified accountant granted to share information::  Yes, Verbal Permission Granted  Name::      [place]::   [place]   Relationship::     Contact Information:     Housing/Transportation Living arrangements for the past 2 months:  [place] of Information:  Patient, Adult Children Patient Interpreter Needed:  None Criminal Activity/Legal Involvement Pertinent to Current Situation/Hospitalization:  No - Comment as needed Significant Relationships:  Adult Children, Spouse Lives with:  Spouse Do you feel safe going back to the place where you live?  Yes Need for family participation in patient care:  Yes (Comment)  Care giving concerns:  Patient lives in [place] and is the caregiver for her husband.    Social Worker assessment / plan:  Holiday representative (CSW) received SNF consult. PT is pending. Per MD patient will need SNF because she can't use a walker. CSW met with patient and her oldest son Cherlynn Kaiser [phone] was at bedside. Patient was alert and oriented X4. CSW introduced self and explained role of CSW department. Patient stated that she lives in [place] with her husband who is disabled. Per patient her husband has dementia and a Psychologist, forensic. Patient reported that she will have to go to SNF for rehab. CSW explained that patient can go to SNF under [facility] and she does not require a 3 night inpatient stay. Patient and son verbalized their understanding and are agreeable to SNF search in [place]. FL2 complete and faxed out. CSW presented bed offers to patient and son and they chose [facility] admissions coordinator at [facility] is aware of  accepted bed offer.   Plan is for patient to D/C to [facility] when medically stable. CSW will continue to follow and assist as needed.   Employment status:  Retired Nurse, adult PT Recommendations:  Not assessed at this time Information / Referral to community resources:  [place]  Patient/Family's Response to care:  Patient and Cherlynn Kaiser are agreeable for patient to go to [facility].   Patient/Family's Understanding of and Emotional Response to Diagnosis, Current Treatment, and Prognosis:  Patient and Cherlynn Kaiser were very pleasant and thanked CSW for assistance.   Emotional Assessment Appearance:  Appears stated age Attitude/Demeanor/Rapport:    Affect (typically observed):  Accepting, Adaptable, Pleasant Orientation:  Oriented to Self, Oriented to Place, Oriented to  Time, Oriented to Situation Alcohol / Substance use:  Not Applicable Psych involvement (Current and /or in the community):  No (Comment)  Discharge Needs  Concerns to be addressed:  Discharge Planning Concerns Readmission within the last 30 days:  No Current discharge risk:  Dependent with Mobility Barriers to Discharge:  Continued Medical Work up   [employer], Veronia Beets, LCSW 07/08/2016, 12:15 PM

## 2016-07-08 NOTE — Progress Notes (Signed)
PT Cancellation Note  Patient Details Name: Teresa Franklin MRN: [id number] DOB: 19-Apr-1930   Cancelled Treatment:    Reason Eval/Treat Not Completed: Other (comment) (pending CT UE to rule out need for surgery).  Will hold PT at this time until plan is established.   Collie Siad PT, DPT 07/08/2016, 11:15 AM

## 2016-07-08 NOTE — Progress Notes (Signed)
[facility] [place] NAME: Teresa Franklin    MR#:  [id number]  DATE OF BIRTH:  1930/09/15  SUBJECTIVE: Patient is seen at bedside. Admitted for fall, noted to have right humerus, right elbow fractures, patient is in a lot of pain on the right hand.   CHIEF COMPLAINT:   Chief Complaint  Patient presents with  . Elbow Pain    REVIEW OF SYSTEMS:   ROS CONSTITUTIONAL: No fever, fatigue or weakness.  EYES: No blurred or double vision.  EARS, NOSE, AND THROAT: No tinnitus or ear pain.  RESPIRATORY: No cough, shortness of breath, wheezing or hemoptysis.  CARDIOVASCULAR: No chest pain, orthopnea, edema.  GASTROINTESTINAL: No nausea, vomiting, diarrhea or abdominal pain.  GENITOURINARY: No dysuria, hematuria.  ENDOCRINE: No polyuria, nocturia,  HEMATOLOGY: No anemia, easy bruising or bleeding SKIN: No rash or lesion. MUSCULOSKELETAL:Severe pain in the right shoulder and also right elbow. She  says that range of motion of fingers for the right hand are better than yesterday. EUROLOGIC: No tingling, numbness, weakness.  PSYCHIATRY: No anxiety or depression.   DRUG ALLERGIES:   Allergies  Allergen Reactions  . Codeine Nausea And Vomiting  . Flomax [Tamsulosin] Other (See Comments)    Pt states that this medication gave her a kidney infection.    Lebron Quam [Hydrocodone-Acetaminophen] Nausea And Vomiting  . Sulfa Antibiotics Nausea And Vomiting    VITALS:  Blood pressure (!) 136/59, pulse 74, temperature 98.3 F (36.8 C), temperature source Oral, resp. rate 20, height 5\' 2"  (1.575 m), weight 56.2 kg (123 lb 14.4 oz), SpO2 99 %.  PHYSICAL EXAMINATION:  GENERAL:  83 y.o.-year-old patient lying in the bed with no acute distress.  EYES: Pupils equal, round, reactive to light and accommodation. No scleral icterus. Extraocular muscles intact.  HEENT: Head atraumatic, normocephalic. Oropharynx and nasopharynx clear.  NECK:  Supple, no jugular  venous distention. No thyroid enlargement, no tenderness.  LUNGS: Normal breath sounds bilaterally, no wheezing, rales,rhonchi or crepitation. No use of accessory muscles of respiration.  CARDIOVASCULAR: S1, S2 normal. No murmurs, rubs, or gallops.  ABDOMEN: Soft, nontender, nondistended. Bowel sounds present. No organomegaly or mass.  EXTREMITIES: And has shoulder immobilizer, right elbow immobilizer present   NEUROLOGIC: Cranial nerves II through XII are intact. Muscle strength 5/5 in all extremities. Sensation intact. Gait not checked.  PSYCHIATRIC: The patient is alert and oriented x 3.  SKIN: No obvious rash, lesion, or ulcer.    LABORATORY PANEL:   CBC  Recent Labs Lab 07/07/16 1953  WBC 9.0  HGB 13.9  HCT 40.3  PLT 102*   ------------------------------------------------------------------------------------------------------------------  Chemistries   Recent Labs Lab 07/07/16 1953  NA 138  K 3.3*  CL 102  CO2 29  GLUCOSE 143*  BUN 17  CREATININE 0.85  CALCIUM 9.1   ------------------------------------------------------------------------------------------------------------------  Cardiac Enzymes No results for input(s): TROPONINI in the last 168 hours. ------------------------------------------------------------------------------------------------------------------  RADIOLOGY:  Dg Shoulder Right  Result Date: 07/07/2016 CLINICAL DATA:  Status post fall. EXAM: RIGHT SHOULDER - 2+ VIEW COMPARISON:  None in PACs FINDINGS: The bones are subjectively osteopenic. There is an acute mildly angulated fracture involving the neck of the right humerus. There is pre-existing chronic deformity of the humeral head and the glenoid. The observed portions of the right clavicle and upper right ribs are intact. The [facility] joint appears intact. IMPRESSION: Acute mildly angulated fracture of the neck of the right humerus. There is underlying severe osteoarthritic change of  the right  glenohumeral joint. Electronically Signed   By: David  Martinique M.D.   On: 07/07/2016 17:11   Dg Elbow Complete Right  Result Date: 07/07/2016 CLINICAL DATA:  Elbow pain and deformity post fall. EXAM: RIGHT ELBOW - COMPLETE 3+ VIEW COMPARISON:  None. FINDINGS: Examination demonstrates a displaced anterior posterior fat pad. There is a complete transverse displaced fracture through the proximal ulna with mild comminution at the level of the elbow. Displaced fracture of the radial head likely with extension to the articular surface. IMPRESSION: Displaced slightly comminuted proximal ulnar fracture. Displaced radial head fracture likely with extension to the articular surface. Electronically Signed   By: Marin Olp M.D.   On: 07/07/2016 17:23   Dg Wrist Complete Left  Result Date: 07/07/2016 CLINICAL DATA:  Elbow pain and deformity post fall. Some left wrist pain. EXAM: LEFT WRIST - COMPLETE 3+ VIEW COMPARISON:  None. FINDINGS: Exam demonstrates ulnar plus variance. Mild degenerative change of the radiocarpal joint and first carpometacarpal joints. Subtle displaced fragment posteriorly on the lateral film suggesting a triquetrum bone fracture. IMPRESSION: Findings suggesting a chip fracture of the triquetrum posteriorly. Electronically Signed   By: Marin Olp M.D.   On: 07/07/2016 17:25   Dg Wrist Complete Right  Result Date: 07/07/2016 CLINICAL DATA:  Right wrist pain due to a fall today. Initial encounter. EXAM: RIGHT WRIST - COMPLETE 3+ VIEW COMPARISON:  None. FINDINGS: No acute bony or joint abnormality is identified. The patient appears to have a remote healed fracture of the fifth metacarpal. There is some first CMC osteoarthritis. Mild triangular fibrocartilage chondrocalcinosis is identified. IMPRESSION: No acute abnormality. Electronically Signed   By: Inge Rise M.D.   On: 07/07/2016 17:10   Ct Elbow Right Wo Contrast  Result Date: 07/08/2016 CLINICAL DATA:  Assess complex elbow  fractures.  Fell yesterday. EXAM: CT OF THE LOWER RIGHT EXTREMITY WITHOUT CONTRAST TECHNIQUE: Multidetector CT imaging of the right lower extremity was performed according to the standard protocol. COMPARISON:  Radiographs 07/07/2016 FINDINGS: Bones/Joint/Cartilage Complex comminuted fracture of the olecranon wound Ob along. There is a vertical split with widening of the olecranon fossa. The coronoid process region is markedly comminuted and mildly displaced. Impaction type fracture involving the radial neck but no significant displacement. No obvious intra-articular involvement. The distal humerus is intact. There is a large joint effusion and there is air in the joint and soft tissues consistent with an open injury. Ligaments Suboptimally assessed by CT. Muscles and Tendons No obvious muscle tears but there is air noted in the triceps region an in the forearm musculature likely related to an open injury. Soft tissues Extensive subcutaneous soft tissue swelling/ edema/fluid/ hematoma. IMPRESSION: Complex comminuted displaced fracture of the olecranon. Nondisplaced and slightly impacted radial neck fracture but no intra-articular involvement. Intra-articular and intramuscular air consistent with an open injury. Electronically Signed   By: Marijo Sanes M.D.   On: 07/08/2016 09:44    EKG:   Orders placed or performed in visit on 06/15/16  . EKG 12-Lead    ASSESSMENT AND PLAN:   83 year old female patient with multiple fractures of the right hand including right shoulder fracture, complex fracture and impacted radial neck fracture; seen by orthopedic, continue mobilization, continue pain medicine, patient is allergic to oxycodone, patient was given morphine or last night with another without any side effects so we are going to continue morphine and Phenergan combination. Discussed the same with patient and patient's son they are okay with that.  Hypokalemia: Continue  potassium supplements #3. Coronary  artery disease with drug-eluting stent placed at [facility] in June 2017. Plavix [facility] cardiology. Has chronic systolic heart failure with EF 30-35%. #4 hyperlipidemia: Continue statins #5 hot flashes; #6 discussed with patient, Dr. Mack Guise, patient's son. All the records are reviewed and case discussed with Care Management/Social Workerr. Management plans discussed with the patient, family and they are in agreement.  CODE STATUS: full  TOTAL TIME TAKING CARE OF THIS PATIENT: 35 minutes.   POSSIBLE D/C IN 1-2DAYS, DEPENDING ON CLINICAL CONDITION.   Epifanio Lesches M.D on 07/08/2016 at 11:29 AM  Between 7am to 6pm - Pager - [phone]  After 6pm go to [email or web address] - password [email or web address] [facility]  Office  [phone]  CC: Primary care physician; [name], MD   Note: This dictation was prepared with Dragon dictation along with smaller phrase technology. Any transcriptional errors that result from this process are unintentional.

## 2016-07-08 NOTE — Progress Notes (Signed)
OT Cancellation Note  Patient Details Name: Teresa Franklin MRN: [id number] DOB: [birth year]   Cancelled Treatment:    Reason Eval/Treat Not Completed: Other (comment). Order received, chart reviewed. Pt pending additional CT to further evaluate fracture. Will hold OT evaluation at this time and continue to monitor pt until further plan of care has been established.   Jeni Salles, MPH, MS, OTR/L ascom [phone] 07/08/16, 10:44 AM

## 2016-07-08 NOTE — NC FL2 (Signed)
[place] LEVEL OF CARE SCREENING TOOL     IDENTIFICATION  Patient Name: Teresa Franklin Birthdate: 1930/10/28 Sex: female Admission Date (Current Location): 07/07/2016  [place] and [place] Number:  Engineering geologist and Address:  [facility], [address], [place], [place] [address]      Provider Number: [id number]  Attending Physician Name and Address:  Epifanio Lesches, MD  Relative Name and Phone Number:       Current Level of Care: Hospital Recommended Level of Care: [place] Prior Approval Number:    Date Approved/Denied:   PASRR Number:  ([id number] )  Discharge Plan: SNF    Current Diagnoses: Patient Active Problem List   Diagnosis Date Noted  . Elbow fracture, right 07/07/2016  . Thrombocytopenia ([place]) 11/12/2015  . Diastolic CHF, acute on chronic ([place]) 09/17/2015  . SOB (shortness of breath)   . UTI (urinary tract infection) 09/16/2015  . Acute right MCA stroke ([place])   . Fall   . Hypokalemia 09/13/2015  . Hypophosphatemia 09/12/2015  . Encounter for intubation   . Acute respiratory failure ([place])   . Complete heart block ([place]) 09/08/2015  . Acute blood loss anemia -thought to be related to in her muscular injection of Phenergan 09/08/2015  . Acute respiratory failure with hypoxia ([place]) 09/08/2015  . Heart block AV second degree   . Essential hypertension   . Arterial hypotension   . Cardiogenic shock ([place])   . NSTEMI (non-ST elevated myocardial infarction) ([place]) 09/06/2015  . Subdural hemorrhage (H[facility]) -history of     Class: History of  . Hyperlipidemia with target LDL less than 70   . Hepatic steatosis     Orientation RESPIRATION BLADDER Height & Weight     Self, Time, Situation, Place  Normal Incontinent Weight: 123 lb 14.4 oz (56.2 kg) Height:  5\' 2"  (157.5 cm)  BEHAVIORAL SYMPTOMS/MOOD NEUROLOGICAL BOWEL NUTRITION STATUS   (none)  (none) Continent Diet (Diet: Heart Healthy )   AMBULATORY STATUS COMMUNICATION OF NEEDS Skin   Extensive Assist Verbally Normal                       Personal Care Assistance Level of Assistance  Bathing, Feeding, Dressing Bathing Assistance: Limited assistance Feeding assistance: Independent Dressing Assistance: Limited assistance     Functional Limitations Info  Sight, Hearing, Speech Sight Info: Adequate Hearing Info: Adequate Speech Info: Adequate    SPECIAL CARE FACTORS FREQUENCY  PT (By licensed PT), OT (By licensed OT)     PT Frequency:  (5) OT Frequency:  (5)            Contractures      Additional Factors Info  Code Status, Allergies Code Status Info:  (Full Code. ) Allergies Info:  (Codeine, Flomax Tamsulosin, Norco Hydrocodone-acetaminophen, Sulfa Antibiotics)           Current Medications (07/08/2016):  This is the current hospital active medication list Current Facility-Administered Medications  Medication Dose Route Frequency Provider Last Rate Last Dose  . acetaminophen (TYLENOL) tablet 650 mg  650 mg Oral Q6H PRN Hillary Bow, MD       Or  . acetaminophen (TYLENOL) suppository 650 mg  650 mg Rectal Q6H PRN Hillary Bow, MD      . ALPRAZolam Duanne Moron) tablet 0.25 mg  0.25 mg Oral QHS PRN Hillary Bow, MD   0.25 mg at 07/08/16 0139  . aspirin chewable tablet 81 mg  81 mg Oral  Daily Hillary Bow, MD   81 mg at 07/08/16 0854  . atorvastatin (LIPITOR) tablet 20 mg  20 mg Oral Daily Hillary Bow, MD   20 mg at 07/08/16 0853  . carvedilol (COREG) tablet 3.125 mg  3.125 mg Oral BID WC Srikar Sudini, MD   3.125 mg at 07/08/16 0854  . enoxaparin (LOVENOX) injection 40 mg  40 mg Subcutaneous Q24H Hillary Bow, MD   40 mg at 07/07/16 2257  . hydrALAZINE (APRESOLINE) injection 10 mg  10 mg Intravenous Q6H PRN Srikar Sudini, MD      . HYDROcodone-acetaminophen (NORCO/VICODIN) 5-325 MG per tablet 0.5 tablet  0.5 tablet Oral Q6H PRN Srikar Sudini, MD      . ibuprofen (ADVIL,MOTRIN) tablet 400 mg  400  mg Oral Q6H PRN Hillary Bow, MD   400 mg at 07/07/16 2045  . levothyroxine (SYNTHROID, LEVOTHROID) tablet 25 mcg  25 mcg Oral QAC breakfast Hillary Bow, MD   25 mcg at 07/08/16 0854  . lisinopril (PRINIVIL,ZESTRIL) tablet 40 mg  40 mg Oral Daily Hillary Bow, MD   40 mg at 07/08/16 0854  . magnesium oxide (MAG-OX) tablet 400 mg  400 mg Oral Daily Hillary Bow, MD   400 mg at 07/08/16 0854  . nitroGLYCERIN (NITROSTAT) SL tablet 0.4 mg  0.4 mg Sublingual Q5 Min x 3 PRN Srikar Sudini, MD      . ondansetron (ZOFRAN) tablet 4 mg  4 mg Oral Q6H PRN Srikar Sudini, MD       Or  . ondansetron (ZOFRAN) injection 4 mg  4 mg Intravenous Q6H PRN Srikar Sudini, MD      . pantoprazole (PROTONIX) EC tablet 40 mg  40 mg Oral Daily Hillary Bow, MD   40 mg at 07/08/16 0854  . polyethylene glycol (MIRALAX / GLYCOLAX) packet 17 g  17 g Oral Daily PRN Hillary Bow, MD      . senna-docusate (Senokot-S) tablet 2 tablet  2 tablet Oral BID Hillary Bow, MD   2 tablet at 07/08/16 9390     Discharge Medications: Please see discharge summary for a list of discharge medications.  Relevant Imaging Results:  Relevant Lab Results:   Additional Information  (SSN: [id number])  Sample, Veronia Beets, LCSW

## 2016-07-08 NOTE — Consult Note (Signed)
Patient seen and examined.  Son at bedside.  Ordering CT of right elbow to further evaluate fracture.  Full consult note to follow after CT.

## 2016-07-08 NOTE — Progress Notes (Signed)
OT Cancellation Note  Patient Details Name: Teresa Franklin MRN: [id number] DOB: 1930/04/07   Cancelled Treatment:    Reason Eval/Treat Not Completed: Other (comment). Per nursing, plan is for surgical intervention on 4/19.  Will complete current OT order.  Will need new OT order following surgical procedure.  Thank you for this referral.  Jeni Salles, MPH, MS, OTR/L ascom [phone] 07/08/16, 3:48 PM

## 2016-07-08 NOTE — Progress Notes (Signed)
PT Cancellation Note  Patient Details Name: TANIKA BRACCO MRN: [id number] DOB: 10-18-1930   Cancelled Treatment:    Reason Eval/Treat Not Completed: Other (comment).  Per nursing, plan is for surgical intervention on 4/19.  Will complete current PT order.  Will need new PT order following surgical procedure.  Thank you for this order.   Collie Siad PT, DPT 07/08/2016, 3:43 PM

## 2016-07-08 NOTE — Clinical Social Work Placement (Signed)
   CLINICAL SOCIAL WORK PLACEMENT  NOTE  Date:  07/08/2016  Patient Details  Name: Teresa Franklin MRN: [id number] Date of Birth: [birth year]  Clinical Social Work is seeking post-discharge placement for this patient at the [place] level of care (*CSW will initial, date and re-position this form in  chart as items are completed):  Yes   Patient/family provided with [place] Work Department's list of facilities offering this level of care within the geographic area requested by the patient (or if unable, by the patient's family).  Yes   Patient/family informed of their freedom to choose among providers that offer the needed level of care, that participate in Medicare, Medicaid or managed care program needed by the patient, have an available bed and are willing to accept the patient.  Yes   Patient/family informed of [facility]'s ownership interest in [facility] and [facility], as well as of the fact that they are under no obligation to receive care at these facilities.  PASRR submitted to EDS on       PASRR number received on       Existing PASRR number confirmed on 07/08/16     FL2 transmitted to all facilities in geographic area requested by pt/family on 07/08/16     FL2 transmitted to all facilities within larger geographic area on       Patient informed that his/her managed care company has contracts with or will negotiate with certain facilities, including the following:        Yes   Patient/family informed of bed offers received.  Patient chooses bed at  [facility] )     Physician recommends and patient chooses bed at      Patient to be transferred to   on  .  Patient to be transferred to facility by       Patient family notified on   of transfer.  Name of family member notified:        PHYSICIAN       Additional Comment:    _______________________________________________ Kingstin Heims, Veronia Beets, LCSW 07/08/2016, 12:14  PM

## 2016-07-09 ENCOUNTER — Encounter
Admission: RE | Admit: 2016-07-09 | Discharge: 2016-07-09 | Disposition: A | Discharge status: Home / Self Care | Payer: Medicare Other | Source: Ambulatory Visit | Attending: Internal Medicine | Admitting: Internal Medicine

## 2016-07-09 DIAGNOSIS — Z01810 Encounter for preprocedural cardiovascular examination: Secondary | ICD-10-CM | POA: Diagnosis not present

## 2016-07-09 LAB — BASIC METABOLIC PANEL
Anion gap: 6 (ref 5–15)
BUN: 29 mg/dL — ABNORMAL HIGH (ref 6–20)
CHLORIDE: 100 mmol/L — AB (ref 101–111)
CO2: 28 mmol/L (ref 22–32)
Calcium: 9.1 mg/dL (ref 8.9–10.3)
Creatinine, Ser: 1.32 mg/dL — ABNORMAL HIGH (ref 0.44–1.00)
GFR calc non Af Amer: 36 mL/min — ABNORMAL LOW (ref >60)
GFR, EST AFRICAN AMERICAN: 42 mL/min — AB (ref >60)
Glucose, Bld: 140 mg/dL — ABNORMAL HIGH (ref 65–99)
POTASSIUM: 3.1 mmol/L — AB (ref 3.5–5.1)
SODIUM: 134 mmol/L — AB (ref 135–145)

## 2016-07-09 MED ORDER — POLYVINYL ALCOHOL 1.4 % OP SOLN
1.0000 [drp] | OPHTHALMIC | Status: DC | PRN
  Administered 2016-07-09: 1 [drp] via OPHTHALMIC
  Filled 2016-07-09: qty 15

## 2016-07-09 MED ORDER — SODIUM CHLORIDE 0.9 % IV SOLN
INTRAVENOUS | Status: DC
  Administered 2016-07-09 (×2): via INTRAVENOUS

## 2016-07-09 MED ORDER — POTASSIUM CHLORIDE CRYS ER 20 MEQ PO TBCR
40.0000 meq | EXTENDED_RELEASE_TABLET | Freq: Once | ORAL | Status: AC
  Administered 2016-07-09: 40 meq via ORAL
  Filled 2016-07-09: qty 2

## 2016-07-09 MED ORDER — ENOXAPARIN SODIUM 30 MG/0.3ML ~~LOC~~ SOLN
30.0000 mg | SUBCUTANEOUS | Status: DC
  Administered 2016-07-09: 30 mg via SUBCUTANEOUS
  Filled 2016-07-09: qty 0.3

## 2016-07-09 NOTE — Evaluation (Signed)
Physical Therapy Evaluation Patient Details Name: Teresa Franklin MRN: [id number] DOB: 21-Jun-1930 Today's Date: 07/09/2016   History of Present Illness  Pt is a 83 y/o F s/p fall with resultant R proximal humerus fx, R comminuted fractures of proximal ulna and radial head with radial head subluxation, L triquetral fx.  Pt's PMH includes CAD, AV block, back pain, chronic diastolic CHF, osteopenia, stroke.    Clinical Impression  Pt admitted with above diagnosis. Pt currently with functional limitations due to the deficits listed below (see PT Problem List). Teresa Franklin was limited today due to significant pain in R shoulder with any mobility.  Unable to trial hemiwalker today due to LUE splint.  Pt requires mod assist for bed mobility and min assist for transfers.  Pt will benefit from skilled PT to increase their independence and safety with mobility to allow discharge to the venue listed below.     Follow Up Recommendations SNF    Equipment Recommendations  Other (comment) (TBD as pt progresses)    Recommendations for Other Services OT consult     Precautions / Restrictions Precautions Precautions: Fall Required Braces or Orthoses: Sling;Other Brace/Splint Other Brace/Splint: sprint for L wrist Restrictions Weight Bearing Restrictions: Yes RUE Weight Bearing: Non weight bearing LUE Weight Bearing: Weight bearing as tolerated (WBAT LUE per verbal order from Dr. Mack Guise)      Mobility  Bed Mobility Overal bed mobility: Needs Assistance Bed Mobility: Supine to Sit     Supine to sit: Mod assist;HOB elevated     General bed mobility comments: Pt able to manage BLEs but requires assist to elevate trunk and bed pad used to scoot pt to EOB.  Transfers Overall transfer level: Needs assistance Equipment used: 1 person hand held assist Transfers: Sit to/from [facility] Sit to Stand: Min assist Stand pivot transfers: Min assist       General transfer comment: 1  person HHA min assist provided with LUE as pt unable to grasp hemiwalker due to splint.    Ambulation/Gait             General Gait Details: Pt politely declines ambulating in room today  Stairs            Wheelchair Mobility    Modified Rankin (Stroke Patients Only)       Balance Overall balance assessment: Needs assistance Sitting-balance support: No upper extremity supported;Feet supported Sitting balance-Leahy Scale: Fair     Standing balance support: Single extremity supported;During functional activity Standing balance-Leahy Scale: Poor Standing balance comment: Relies on 1UE support to remain steady                             Pertinent Vitals/Pain Pain Assessment: Faces Faces Pain Scale: Hurts whole lot Pain Location: R shoulder with mobility Pain Descriptors / Indicators: Grimacing;Guarding;Moaning Pain Intervention(s): Limited activity within patient's tolerance;Monitored during session;Repositioned    Home Living Family/patient expects to be discharged to:: Skilled nursing facility Living Arrangements: Spouse/significant other               Additional Comments: Pt lives with her husband who uses a RW to ambulate.  Teresa Franklin reports she does not have to assist her husband with ADLs but pt does all the cooking, cleaning.     Prior Function Level of Independence: Independent         Comments: Pt was Ind PTA not using AD to ambulate and denies any other falls  in the past year.     Hand Dominance        Extremity/Trunk Assessment   Upper Extremity Assessment Upper Extremity Assessment: RUE deficits/detail;LUE deficits/detail RUE Deficits / Details: RUE immobilized in sling with NWB precautions RUE: Unable to fully assess due to immobilization;Unable to fully assess due to pain LUE Deficits / Details: Splint LUE with plan for velcro splint that has been ordered by Dr. Mack Guise.  Pt demonstrates full L shoulder flexion AROM and  elbow AROM.    Lower Extremity Assessment Lower Extremity Assessment:  (Strength grossly 4/5 BLEs)    Cervical / Trunk Assessment Cervical / Trunk Assessment: Kyphotic  Communication   Communication: No difficulties  Cognition Arousal/Alertness: Awake/alert Behavior During Therapy: WFL for tasks assessed/performed Overall Cognitive Status: Within Functional Limits for tasks assessed                                        General Comments      Exercises     Assessment/Plan    PT Assessment Patient needs continued PT services  PT Problem List Decreased strength;Decreased range of motion;Decreased activity tolerance;Decreased balance;Decreased mobility;Decreased knowledge of use of DME;Decreased safety awareness;Pain;Decreased knowledge of precautions       PT Treatment Interventions DME instruction;Gait training;Stair training;Functional mobility training;Therapeutic activities;Therapeutic exercise;Balance training;Neuromuscular re-education;Patient/family education;Modalities;Wheelchair mobility training    PT Goals (Current goals can be found in the Care Plan section)  Acute Rehab PT Goals Patient Stated Goal: rehab before home, to return to PLOF PT Goal Formulation: With patient/family Time For Goal Achievement: 07/23/16 Potential to Achieve Goals: Good    Frequency 7X/week   Barriers to discharge Decreased caregiver support Pt lives with her husband who is unable to provide assist/supervision    Co-evaluation               End of Session Equipment Utilized During Treatment: Other (comment) (sling RUE) Activity Tolerance: Patient limited by pain Patient left: in chair;with call bell/phone within reach;with chair alarm set;with family/visitor present;Other (comment) (RUE propped up on pillow) Nurse Communication: Mobility status;Weight bearing status;Other (comment) (pt requests assist for feeding) PT Visit Diagnosis: Pain;Unsteadiness on feet  (R26.81) Pain - Right/Left: Right Pain - part of body: Shoulder    Time: 2202-5427 PT Time Calculation (min) (ACUTE ONLY): 21 min   Charges:   PT Evaluation $PT Eval Low Complexity: 1 Procedure     PT G Codes:   PT G-Codes **NOT FOR INPATIENT CLASS** Functional Assessment Tool Used: Clinical judgement;AM-PAC 6 Clicks Basic Mobility Functional Limitation: Changing and maintaining body position Changing and Maintaining Body Position Current Status (C6237): At least 60 percent but less than 80 percent impaired, limited or restricted Changing and Maintaining Body Position Goal Status (S2831): At least 1 percent but less than 20 percent impaired, limited or restricted    Collie Siad PT, DPT 07/09/2016, 1:22 PM

## 2016-07-09 NOTE — Progress Notes (Signed)
Anticoagulation monitoring(Lovenox):  83yo  female ordered Lovenox 40 mg Q24h for DVT prevention  Filed Weights   07/07/16 1610 07/07/16 2037  Weight: 125 lb 8 oz (56.9 kg) 123 lb 14.4 oz (56.2 kg)    Lab Results  Component Value Date   CREATININE 1.32 (H) 07/09/2016   CREATININE 0.85 07/07/2016   CREATININE 0.83 11/12/2015   Estimated Creatinine Clearance: 25.5 mL/min (A) (by C-G formula based on SCr of 1.32 mg/dL (H)). Hemoglobin & Hematocrit     Component Value Date/Time   HGB 13.9 07/07/2016 1953   HGB 12.4 03/26/2013 1808   HCT 40.3 07/07/2016 1953   HCT 36.1 03/26/2013 1808     Per Protocol for Patient with estCrcl < 30 ml/min and BMI < 40, will transition to Lovenox 30 mg Q24h.     Paulina Fusi, PharmD, BCPS 07/09/2016 5:26 PM
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
## 2016-07-09 NOTE — Progress Notes (Signed)
Per MD patient will likely D/C tomorrow and will come back for surgery at a later date. Plan is for patient to D/C to [facility]. [facility] admissions coordinator at [facility] is aware of above. Patient and her son Cherlynn Kaiser are aware of above. CSW will continue to follow and assist as needed.   [employer], LCSW [phone]

## 2016-07-09 NOTE — Consult Note (Signed)
Cardiology Consult    Patient ID: Teresa Franklin MRN: [id number], DOB/AGE: Aug 10, 1930   Admit date: 07/07/2016 Date of Consult: 07/09/2016  Primary Physician: [name], MD Reason for Consult: Preoperative Cardiac Clearance Primary Cardiologist: Dr. Fletcher Anon Requesting Provider: Dr. Vianne Bulls   History of Present Illness    Teresa Franklin is a 83 y.o. female with past medical history of CAD (s/p NSTEMI in 08/2015 with DES to RPAV complicated by high-grade AV block requiring temp pacer but no PPM), chronic systolic CHF (EF 54-00% by echo in 05/2016), prior CVA (occurring in 08/2015), HTN, and HLD who is being seen today for the evaluation of preoperative cardiac clearance at the request of Dr. Vianne Bulls.   She underwent a nuclear stress test in 04/2016 which showed no evidence of ischemia and was overall low-risk. Echo in 05/2016 with a reduced EF of 30-35% (previously 55-60% in 08/2015), severe HK of the inferolateral and inferior myocardium, and moderate MR was noted. She was seen by Dr. Fletcher Anon on 06/15/2016 and reported still having exertional dyspnea at that time. She was continued on PO Lasix 20mg  daily along with ASA, Plavix, Atorvastatin 20mg  daily, Coreg 3.125mg  BID, and Lisinopril 40mg  daily.      She presented to [facility] on 07/07/2016 for elbow pain following a fall, as she stepped on an uneven surface and lost her footing. No associated chest pain, dyspnea, palpitations, lightheadedness, or dizziness at that time. Imaging upon arrival to the ED showed acute angulated fx of the right humerus, comminuted right proximal ulnar fx, fx of the left triquetrum. She has been unable to use her hands.   Per orthopedics, the plan is for the patient to go to rehabilitation then undergo surgery once her swelling improves. Therefore, cardiology has been consulted for clearance.   She denies any recent chest pain or palpitations. She has baseline dyspnea on exertion but denies any acute worsening of her  symptoms. Denies any orthopnea, PND, or lower extremity edema. She reports being "off-balance" since her concussion in 2015. Participates in water aerobics 2x per week without any anginal symptoms. Is the primary caregiver for her Husband who has dementia.   Past Medical History   Past Medical History:  Diagnosis Date  . Anxiety   . AV block    a. 08/2015 in setting of NSTEMI and RPAV intervention-->required temp wire but not PPM.  . Back pain   . Chronic diastolic CHF (congestive heart failure) ([place])    a. 08/2015  Echo: EF 55-60%, no rwma, mod MR, mildly dil LA/RA, mod TR, PASP 53mmHg.  Marland Kitchen Coronary artery disease    a. 08/2015 NSTEMI/PCI: LM nl, LAD 75p (med rx), 20 diffuse, 50d, LCX nl, OM1/2 nl, RCA 63m, RPAV 100 (2.25x12 Promus Premier DES) - complicated by 2:1 HB req temp wire post-PCI.  Marland Kitchen Diverticulitis   . Generalized anxiety disorder   . Hepatic steatosis   . HLD (hyperlipidemia)   . Hypertension   . Hypokalemia   . Hypomagnesemia   . Hypothyroidism   . OA (osteoarthritis)   . Osteopenia   . Sleep apnea   . Stroke [facility])    a. 08/2015 Right PCA territory infarct/temoral lobe cerebral infarction-->MRA showed severe flow liminting stenosis of R P2 cerebral artery;  b. 08/2015 Carotid U/S: 1-39% bilat ICA stenosis.  . Subdural hemorrhage ([place])    a. 05/2013 in setting of fall.  . Thrombocytopenia ([place])   . Weakness     Past Surgical History:  Procedure Laterality  Date  . CARDIAC CATHETERIZATION N/A 09/08/2015   Procedure: Temporary Pacemaker;  Surgeon: Leonie Man, MD;  Location: [place] [facility];  Service: Cardiovascular;  Laterality: N/A;  . CARDIAC CATHETERIZATION N/A 09/08/2015   Procedure: Left Heart Cath and Coronary Angiography;  Surgeon: Leonie Man, MD;  Location: [place] [facility];  Service: Cardiovascular;  Laterality: N/A;  . CARDIAC CATHETERIZATION N/A 09/08/2015   Procedure: Coronary Stent Intervention;  Surgeon: Leonie Man, MD;  Location: [place]  [facility];  Service: Cardiovascular;  Laterality: N/A;  . CARDIAC CATHETERIZATION  09/08/2015   Procedure: Central Line Insertion;  Surgeon: Leonie Man, MD;  Location: [place] [facility];  Service: Cardiovascular;;  . CARDIAC CATHETERIZATION  09/08/2015   Procedure: Arterial Line Insertion;  Surgeon: Leonie Man, MD;  Location: [place] [facility];  Service: Cardiovascular;;  . CARDIAC CATHETERIZATION N/A 09/09/2015   Procedure: Temporary Wire;  Surgeon: Lorretta Harp, MD;  Location: [place] [facility];  Service: Cardiovascular;  Laterality: N/A;     Allergies  Allergies  Allergen Reactions  . Codeine Nausea And Vomiting  . Flomax [Tamsulosin] Other (See Comments)    Pt states that this medication gave her a kidney infection.    Lebron Quam [Hydrocodone-Acetaminophen] Nausea And Vomiting  . Sulfa Antibiotics Nausea And Vomiting    Inpatient Medications    . aspirin  81 mg Oral Daily  . atorvastatin  20 mg Oral Daily  . carvedilol  3.125 mg Oral BID WC  . clopidogrel  75 mg Oral Q breakfast  . enoxaparin (LOVENOX) injection  40 mg Subcutaneous Q24H  . levothyroxine  25 mcg Oral QAC breakfast  . lisinopril  40 mg Oral Daily  . magnesium oxide  400 mg Oral Daily  . pantoprazole  40 mg Oral Daily  . potassium chloride  40 mEq Oral Once  . senna-docusate  2 tablet Oral BID    Family History    Family History  Problem Relation Age of Onset  . Hypertension Mother   . Hyperlipidemia Mother   . Stroke Mother   . Diabetes Sister   . Hypertension Sister   . Hyperlipidemia Sister   . Bipolar disorder Sister   . Hyperlipidemia Sister   . Stroke Sister   . Prostate cancer Brother   . Breast cancer Neg Hx     Social History    Social History   Social History  . Marital status: Married    Spouse name: N/A  . Number of children: N/A  . Years of education: N/A   Occupational History  . Not on file.   Social History Main Topics  . Smoking status: Never Smoker  .  Smokeless tobacco: Never Used  . Alcohol use No  . Drug use: No  . Sexual activity: No   Other Topics Concern  . Not on file   Social History Narrative  . No narrative on file     Review of Systems    General:  No chills, fever, night sweats or weight changes.  Cardiovascular:  No chest pain, edema, orthopnea, palpitations, paroxysmal nocturnal dyspnea. Positive for dyspnea on exertion.  Dermatological: No rash, lesions/masses Respiratory: No cough, dyspnea Urologic: No hematuria, dysuria MSK: Positive for bilateral arm pain.  Abdominal:   No nausea, vomiting, diarrhea, bright red blood per rectum, melena, or hematemesis Neurologic:  No visual changes, wkns, changes in mental status. All other systems reviewed and are otherwise negative except as noted  above.  Physical Exam    Blood pressure (!) 105/49, pulse 70, temperature 98.3 F (36.8 C), temperature source Oral, resp. rate 18, height 5\' 2"  (1.575 m), weight 123 lb 14.4 oz (56.2 kg), SpO2 96 %.  General: Pleasant, elderly Caucasian female appearing in NAD Psych: Normal affect. Neuro: Alert and oriented X 3. Moves all extremities spontaneously. HEENT: Normal  Neck: Supple without bruits or JVD. Lungs:  Resp regular and unlabored, CTA without wheezing or rales. Heart: RRR no s3, s4, or murmurs. Abdomen: Soft, non-tender, non-distended, BS + x 4.  Extremities: No clubbing, cyanosis or lower extremity edema. DP 2+ and equal bilaterally. Upper extremities wrapped.   Labs    Troponin (Point of Care Test) No results for input(s): TROPIPOC in the last 72 hours. No results for input(s): CKTOTAL, CKMB, TROPONINI in the last 72 hours. Lab Results  Component Value Date   WBC 9.0 07/07/2016   HGB 13.9 07/07/2016   HCT 40.3 07/07/2016   MCV 90.4 07/07/2016   PLT 102 (L) 07/07/2016    Recent Labs Lab 07/07/16 1953  NA 138  K 3.3*  CL 102  CO2 29  BUN 17  CREATININE 0.85  CALCIUM 9.1  GLUCOSE 143*   Lab Results    Component Value Date   CHOL 114 09/09/2015   HDL 38 (L) 09/09/2015   LDLCALC 54 09/09/2015   TRIG 111 09/09/2015   No results found for: [facility]   Radiology Studies    Dg Shoulder Right  Result Date: 07/07/2016 CLINICAL DATA:  Status post fall. EXAM: RIGHT SHOULDER - 2+ VIEW COMPARISON:  None in PACs FINDINGS: The bones are subjectively osteopenic. There is an acute mildly angulated fracture involving the neck of the right humerus. There is pre-existing chronic deformity of the humeral head and the glenoid. The observed portions of the right clavicle and upper right ribs are intact. The [facility] joint appears intact. IMPRESSION: Acute mildly angulated fracture of the neck of the right humerus. 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 Electronically Signed   By: David  Martinique M.D.   On: 07/07/2016 17:11   Dg Elbow Complete Right  Result Date: 07/07/2016 CLINICAL DATA:  Elbow pain and deformity post fall. EXAM: RIGHT ELBOW - COMPLETE 3+ VIEW COMPARISON:  None. FINDINGS: Examination demonstrates a displaced anterior posterior fat pad. There is a complete transverse displaced fracture through the proximal ulna with mild comminution at the level of the elbow. Displaced fracture of the radial head likely with extension to the articular surface. IMPRESSION: Displaced slightly comminuted proximal ulnar fracture. Displaced radial head fracture likely with extension to the articular surface. Electronically Signed   By: Marin Olp M.D.   On: 07/07/2016 17:23   Dg Wrist Complete Left  Result Date: 07/07/2016 CLINICAL DATA:  Elbow pain and deformity post fall. Some left wrist pain. EXAM: LEFT WRIST - COMPLETE 3+ VIEW COMPARISON:  None. FINDINGS: Exam demonstrates ulnar plus variance. Mild degenerative change of the radiocarpal joint and first carpometacarpal joints. Subtle displaced fragment posteriorly on the lateral film suggesting a triquetrum bone fracture.  IMPRESSION: Findings suggesting a chip fracture of the triquetrum posteriorly. Electronically Signed   By: Marin Olp M.D.   On: 07/07/2016 17:25   Dg Wrist Complete Right  Result Date: 07/07/2016 CLINICAL DATA:  Right wrist pain due to a fall today. Initial encounter. EXAM: RIGHT WRIST - COMPLETE 3+ VIEW COMPARISON:  None. FINDINGS: No acute bony or joint abnormality is identified. The  patient appears to have a remote healed fracture of the fifth metacarpal. There is some first CMC osteoarthritis. Mild triangular fibrocartilage chondrocalcinosis is identified. IMPRESSION: No acute abnormality. Electronically Signed   By: Inge Rise M.D.   On: 07/07/2016 17:10   Ct Elbow Right Wo Contrast  Result Date: 07/08/2016 CLINICAL DATA:  Assess complex elbow fractures.  Fell yesterday. EXAM: CT OF THE LOWER RIGHT EXTREMITY WITHOUT CONTRAST TECHNIQUE: Multidetector CT imaging of the right lower extremity was performed according to the standard protocol. COMPARISON:  Radiographs 07/07/2016 FINDINGS: Bones/Joint/Cartilage Complex comminuted fracture of the olecranon wound Ob along. There is a vertical split with widening of the olecranon fossa. The coronoid process region is markedly comminuted and mildly displaced. Impaction type fracture involving the radial neck but no significant displacement. No obvious intra-articular involvement. The distal humerus is intact. There is a large joint effusion and there is air in the joint and soft tissues consistent with an open injury. Ligaments Suboptimally assessed by CT. Muscles and Tendons No obvious muscle tears but there is air noted in the triceps region an in the forearm musculature likely related to an open injury. Soft tissues Extensive subcutaneous soft tissue swelling/ edema/fluid/ hematoma. IMPRESSION: Complex comminuted displaced fracture of the olecranon. Nondisplaced and slightly impacted radial neck fracture but no intra-articular involvement.  Intra-articular and intramuscular air consistent with an open injury. Electronically Signed   By: Marijo Sanes M.D.   On: 07/08/2016 09:44    EKG & Cardiac Imaging    EKG:  06/15/2016: NSR, HR 65, with no acute ST or T-wave changes. - Personally Reviewed  Cardiac Catheterization: 08/2015 1. Post Atrio lesion, 100% stenosed. Post intervention with Promus. DES 2.25 mm x 12 mm (2.3 mm), there is a 0% residual stenosis. 2. Prox LAD lesion, 75% stenosed. Dist midLAD lesion, 50% stenosed. 3. Severely elevated LVEDP consistent with acute likely combined systolic and diastolic heart failure 4. Resolved high-grade AV block - temporary wire removed 5. Successful intubation by anesthesiology for acute hypoxic respiratory failure 6. Right IJ central line placed along with a right radial arterial line placed by Dr. Haroldine Laws   Successful emergent PCI on Right Posterior AV groove branch with a DES stent. Successful intubation for hypoxic respiratory failure  Existing calcified proximal to mid LAD lesion on either side of D1 that would likely need to be treated prior to discharge and her level of symptoms. May require rotational atherectomy.  Successful RIJ central line and right radial arterial line placement post emergent PCI.    Plan:  CCU admission.  Femoral arterial and venous line removal per protocol following Angiomax discontinuation  Continue can Cangrelor infusion until able to administer Per OGT Plavix 300 mg  [facility] M is been contacted to manage the ventilator - would keep her intubated for likely staged rotational threatening PCI of the LAD  Recheck 2-D echocardiogram, as she clearly has had a change in status from her recent echo at [facility]  She will likely require additional IV Lasix.  She remains limited code. Orders have been changed.  Echocardiogram: 05/2016 Study Conclusions  - Left ventricle: The cavity size was moderately dilated. There was   mild concentric hypertrophy.  Systolic function was moderately to   severely reduced. The estimated ejection fraction was in the   range of 30% to 35%. Severe hypokinesis of the inferolateral and   inferior myocardium. Doppler parameters are consistent with   abnormal left ventricular relaxation (grade 1 diastolic   dysfunction). - Mitral valve:  There was moderate regurgitation. - Left atrium: The atrium was mildly dilated. - Pulmonary arteries: Systolic pressure was within the normal   range. - Pericardium, extracardiac: A trivial pericardial effusion was   identified posterior to the heart.   NST: 04/2016 Pharmacological myocardial perfusion imaging study with no significant  Ischemia Moderate sized region of fixed defect in the lateral wall of moderate severity Lateral wall motion abnormality, hypokinesis noted,  EF estimated at 34% No EKG changes concerning for ischemia at peak stress or in recovery. Baseline EKG with T wave abn in V5 and V6 Low risk scan   Assessment & Plan    1. Preoperative Cardiac Clearance for Surgical Repair of Multiple Fractures along bilateral upper extremities - the patient has a known history of CAD (s/p stent placement to RPAV in 08/2015) and chronic systolic CHF (EF 31-59%). She recently underwent stress testing in 04/2016 which showed no evidence of ischemia. She does have a known 75% Prox LAD lesion which is thought to be high-risk for PCI.  - she has baseline dyspnea on exertion but participates in water aerobics 2x per week without any anginal symptoms.  - from a cardiac perspective, she would be at moderate-risk for the above procedure with her known CAD and CHF. No further testing is indicated prior to her surgery as she just recently had a stress test within the past 2 months - with her being > 6 months out from stent placement, can hold Plavix for up 5 days if needed prior to her procedure. Dr. Fletcher Anon to see later today.   2. CAD - s/p NSTEMI in 08/2015 with DES to RPAV  complicated by high-grade AV block requiring temp pacer but no PPM. Had a residual 75% Prox LAD lesion which has been treated medically as proceeding with PCI is thought to be of high-risk in regards to possible diagonal branch closure.  - recent NST in 04/2016 showed no evidence of ischemia and was overall low-risk. - continue ASA, Plavix (hold for 5 days prior to her surgery if needed), BB, and statin therapy.  3. Chronic systolic CHF - EF 45-85% by echo in 05/2016, previously 55-60% in 08/2015. - she does not appear volume overloaded by physical examination.  - continue BB and ACE-I. PTA Lasix held as she is receiving IVF in the setting of poor oral intake.  4. HTN - BP at 105/49 - 111/55 in the past 24 hours.  - continue Coreg and Lisinopril.   5. HLD - continue statin therapy.   6. Moderate MR - noted on most recent echo. Continue to follow as an outpatient.    Signed, Erma Heritage, PA-C 07/09/2016, 10:48 AM Pager: [phone]

## 2016-07-09 NOTE — Progress Notes (Signed)
[place] [facility] [place] NAME: Amelda Hapke    MR#:  [id number]  DATE OF BIRTH:  04-13-1930  SUBJECTIVE: admitted  yesterday for fall, found to have a right humerus, right radial fracture. Also has a small fracture in the left wrist. Not able to use both hands. And she is frustrated that she is not able to eat and drink, concern about the dehydration and also inquiring about surgery for the hands.   CHIEF COMPLAINT:   Chief Complaint  Patient presents with  . Elbow Pain    REVIEW OF SYSTEMS:   ROS CONSTITUTIONAL: No fever, fatigue or weakness.  EYES: No blurred or double vision.  EARS, NOSE, AND THROAT: No tinnitus or ear pain.  RESPIRATORY: No cough, shortness of breath, wheezing or hemoptysis.  CARDIOVASCULAR: No chest pain, orthopnea, edema.  GASTROINTESTINAL: No nausea, vomiting, diarrhea or abdominal pain.  GENITOURINARY: No dysuria, hematuria.  ENDOCRINE: No polyuria, nocturia,  HEMATOLOGY: No anemia, easy bruising or bleeding SKIN: No rash or lesion. MUSCULOSKELETAL:Severe pain in the right shoulder and also right elbow.Has severe pain in the left wrist  Today,  nEUROLOGIC: No tingling, numbness, weakness.  PSYCHIATRY: No anxiety or depression.   DRUG ALLERGIES:   Allergies  Allergen Reactions  . Codeine Nausea And Vomiting  . Flomax [Tamsulosin] Other (See Comments)    Pt states that this medication gave her a kidney infection.    Lebron Quam [Hydrocodone-Acetaminophen] Nausea And Vomiting  . Sulfa Antibiotics Nausea And Vomiting    VITALS:  Blood pressure (!) 105/49, pulse 70, temperature 98.3 F (36.8 C), temperature source Oral, resp. rate 18, height 5\' 2"  (1.575 m), weight 56.2 kg (123 lb 14.4 oz), SpO2 96 %.  PHYSICAL EXAMINATION:  GENERAL:  83 y.o.-year-old patient lying in the bed with no acute distress.  EYES: Pupils equal, round, reactive to light and accommodation. No scleral icterus. Extraocular muscles  intact.  HEENT: Head atraumatic, normocephalic. Oropharynx and nasopharynx clear.  NECK:  Supple, no jugular venous distention. No thyroid enlargement, no tenderness.  LUNGS: Normal breath sounds bilaterally, no wheezing, rales,rhonchi or crepitation. No use of accessory muscles of respiration.  CARDIOVASCULAR: S1, S2 normal. No murmurs, rubs, or gallops.  ABDOMEN: Soft, nontender, nondistended. Bowel sounds present. No organomegaly or mass.  EXTREMITIES: And has shoulder immobilizer, right elbow immobilizer present. Coban present for the both hands.  NEUROLOGIC: Cranial nerves II through XII are intact. Muscle strength 5/5 in all extremities. Sensation intact. Gait not checked.  PSYCHIATRIC: The patient is alert and oriented x 3.  SKIN: No obvious rash, lesion, or ulcer.    LABORATORY PANEL:   CBC  Recent Labs Lab 07/07/16 1953  WBC 9.0  HGB 13.9  HCT 40.3  PLT 102*   ------------------------------------------------------------------------------------------------------------------  Chemistries   Recent Labs Lab 07/07/16 1953  NA 138  K 3.3*  CL 102  CO2 29  GLUCOSE 143*  BUN 17  CREATININE 0.85  CALCIUM 9.1   ------------------------------------------------------------------------------------------------------------------  Cardiac Enzymes No results for input(s): TROPONINI in the last 168 hours. ------------------------------------------------------------------------------------------------------------------  RADIOLOGY:  Dg Shoulder Right  Result Date: 07/07/2016 CLINICAL DATA:  Status post fall. EXAM: RIGHT SHOULDER - 2+ VIEW COMPARISON:  None in PACs FINDINGS: The bones are subjectively osteopenic. There is an acute mildly angulated fracture involving the neck of the right humerus. There is pre-existing chronic deformity of the humeral head and the glenoid. The observed portions of the right clavicle and upper right ribs are intact.  The [facility] joint appears intact.  IMPRESSION: Acute mildly angulated fracture of the neck of the right humerus. 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 Electronically Signed   By: David  Martinique M.D.   On: 07/07/2016 17:11   Dg Elbow Complete Right  Result Date: 07/07/2016 CLINICAL DATA:  Elbow pain and deformity post fall. EXAM: RIGHT ELBOW - COMPLETE 3+ VIEW COMPARISON:  None. FINDINGS: Examination demonstrates a displaced anterior posterior fat pad. There is a complete transverse displaced fracture through the proximal ulna with mild comminution at the level of the elbow. Displaced fracture of the radial head likely with extension to the articular surface. IMPRESSION: Displaced slightly comminuted proximal ulnar fracture. Displaced radial head fracture likely with extension to the articular surface. Electronically Signed   By: Marin Olp M.D.   On: 07/07/2016 17:23   Dg Wrist Complete Left  Result Date: 07/07/2016 CLINICAL DATA:  Elbow pain and deformity post fall. Some left wrist pain. EXAM: LEFT WRIST - COMPLETE 3+ VIEW COMPARISON:  None. FINDINGS: Exam demonstrates ulnar plus variance. Mild degenerative change of the radiocarpal joint and first carpometacarpal joints. Subtle displaced fragment posteriorly on the lateral film suggesting a triquetrum bone fracture. IMPRESSION: Findings suggesting a chip fracture of the triquetrum posteriorly. Electronically Signed   By: Marin Olp M.D.   On: 07/07/2016 17:25   Dg Wrist Complete Right  Result Date: 07/07/2016 CLINICAL DATA:  Right wrist pain due to a fall today. Initial encounter. EXAM: RIGHT WRIST - COMPLETE 3+ VIEW COMPARISON:  None. FINDINGS: No acute bony or joint abnormality is identified. The patient appears to have a remote healed fracture of the fifth metacarpal. There is some first CMC osteoarthritis. Mild triangular fibrocartilage chondrocalcinosis is identified. IMPRESSION: No acute abnormality. Electronically Signed   By:  Inge Rise M.D.   On: 07/07/2016 17:10   Ct Elbow Right Wo Contrast  Result Date: 07/08/2016 CLINICAL DATA:  Assess complex elbow fractures.  Fell yesterday. EXAM: CT OF THE LOWER RIGHT EXTREMITY WITHOUT CONTRAST TECHNIQUE: Multidetector CT imaging of the right lower extremity was performed according to the standard protocol. COMPARISON:  Radiographs 07/07/2016 FINDINGS: Bones/Joint/Cartilage Complex comminuted fracture of the olecranon wound Ob along. There is a vertical split with widening of the olecranon fossa. The coronoid process region is markedly comminuted and mildly displaced. Impaction type fracture involving the radial neck but no significant displacement. No obvious intra-articular involvement. The distal humerus is intact. There is a large joint effusion and there is air in the joint and soft tissues consistent with an open injury. Ligaments Suboptimally assessed by CT. Muscles and Tendons No obvious muscle tears but there is air noted in the triceps region an in the forearm musculature likely related to an open injury. Soft tissues Extensive subcutaneous soft tissue swelling/ edema/fluid/ hematoma. IMPRESSION: Complex comminuted displaced fracture of the olecranon. Nondisplaced and slightly impacted radial neck fracture but no intra-articular involvement. Intra-articular and intramuscular air consistent with an open injury. Electronically Signed   By: Marijo Sanes M.D.   On: 07/08/2016 09:44    EKG:   Orders placed or performed in visit on 06/15/16  . EKG 12-Lead    ASSESSMENT AND PLAN:   83 year old female patient with multiple fractures of the right hand including right shoulder fracture, complex fracture and impacted radial neck fracture; seen by orthopedic, continue mobilization, continue pain medicine, patient is allergic to oxycodone, patient was given morphine or last night with another without any side effects so we  are going to continue morphine and Phenergan  combination.  Spoke with Dr. Mack Guise today, he told me that patient can go to rehabilitation and had surgery after her swelling improves. His cardiac clearance due to history of stent placement in less than a year ago.   Hypokalemia: Continue potassium supplements #3. Coronary artery disease with drug-eluting stent placed at [facility] in June 2017. Plavix [facility] cardiology. Has chronic systolic heart failure with EF 30-35%. #4 hyperlipidemia: Continue statins #5 hot flashes; #6 . Poor by mouth intake secondary to  Fractures bilaterally;started IV hydration.likley d/c am   All the records are reviewed and case discussed with Care Management/Social Workerr. Management plans discussed with the patient, family and they are in agreement.  CODE STATUS: full  TOTAL TIME TAKING CARE OF THIS PATIENT: 35 minutes.   POSSIBLE D/C IN 1-2DAYS, DEPENDING ON CLINICAL CONDITION.   Epifanio Lesches M.D on 07/09/2016 at 10:21 AM  Between 7am to 6pm - Pager - [phone]  After 6pm go to [email or web address] - password [email or web address]  Office  [phone]  CC: Primary care physician; [name], MD   Note: This dictation was prepared with Dragon dictation along with smaller phrase technology. Any transcriptional errors that result from this process are unintentional.

## 2016-07-09 NOTE — Evaluation (Signed)
Occupational Therapy Evaluation Patient Details Name: Teresa Franklin MRN: [id number] DOB: [birth year] Today's Date: 07/09/2016    History of Present Illness Pt is a 83 y/o F s/p fall with resultant R proximal humerus fx, R comminuted fractures of proximal ulna and radial head with radial head subluxation, L triquetral fx.  Pt's PMH includes CAD, AV block, back pain, chronic diastolic CHF, osteopenia, stroke.   Clinical Impression   Pt seen for OT evaluation this date. 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 Pt endorses using a rollator "sometimes", was active attending water aerobics 2x/wk prior to admission. Pt with significant functional deficits in all aspects of self care due to significant pain, immobilization, decreased strength/ROM/activity tolerance and need for increased assist for ADL. Pt will benefit from skilled OT services to address functional deficits due to noted impairments in STR until swelling has gone down enough to have surgical repair of UE fractures.     Follow Up Recommendations  SNF    Equipment Recommendations  3 in 1 bedside commode    Recommendations for Other Services       Precautions / Restrictions Precautions Precautions: Fall Required Braces or Orthoses: Sling;Other Brace/Splint Other Brace/Splint: velcro wrist splint for L wrist; RUE immobilized w/ sling Restrictions Weight Bearing Restrictions: Yes RUE Weight Bearing: Non weight bearing LUE Weight Bearing: Weight bearing as tolerated (WBAT LUE per verbal order from Dr. Lisette Grinder)      Mobility Bed Mobility Overal bed mobility: Needs Assistance Bed Mobility: Supine to Sit;Sit to Supine     Supine to sit: Mod assist;HOB elevated Sit to supine: Mod assist   General bed mobility comments: mod assist to  manage BLEs for sit>sup and to scoot up in bed using bed pad  Transfers Overall transfer level: Needs assistance Equipment used: 1 person hand held  assist Transfers: Sit to/from Stand;Stand Pivot Transfers Sit to Stand: Min guard Stand pivot transfers: Min guard       General transfer comment: 1 person HHA - min guard to transfer EOB to [facility]    Balance Overall balance assessment: Needs assistance Sitting-balance support: No upper extremity supported;Feet supported Sitting balance-Leahy Scale: Fair     Standing balance support: Single extremity supported;During functional activity Standing balance-Leahy Scale: Poor Standing balance comment: Relies on 1UE support to remain steady                           ADL either performed or assessed with clinical judgement   ADL Overall ADL's : Needs assistance/impaired Eating/Feeding: Bed level;Set up Eating/Feeding Details (indicate cue type and reason): pt requires set up of smaller/lighter cup with straw in order to hold and bring to mouth to drink; pt declined to attempt to trial eating with utensils - not hungry Grooming: Bed level;Minimal assistance   Upper Body Bathing: Maximal assistance;Bed level   Lower Body Bathing: Total assistance;Bed level   Upper Body Dressing : Maximal assistance;Bed level   Lower Body Dressing: Maximal assistance;Sit to/from stand   Toilet Transfer: Min guard;BSC;Stand-pivot;Cueing for Office manager Details (indicate cue type and reason): pt able to transfer to [facility]  with min guard and verbal cues for hand placement; pt with significant pain in RUE during movements Toileting- Clothing Manipulation and Hygiene: Maximal assistance;Sit to/from stand Toileting - Clothing Manipulation Details (indicate cue type and reason): pt required max assist for doff/donning briefs and hygiene after very watery loose BM  General ADL Comments: pt generally unable to assist much with UB or LB ADL tasks due to RUE and LUE impairments and significant pain with any movement     Vision   Eye Alignment: Within Functional Limits     Perception      Praxis      Pertinent Vitals/Pain Pain Assessment: 0-10 Pain Score: 10-Worst pain ever Faces Pain Scale: Hurts whole lot Pain Location: 6/10 at rest, 10/10 with any movement Pain Descriptors / Indicators: Grimacing;Guarding;Moaning Pain Intervention(s): Premedicated before session;Monitored during session;Limited activity within patient's tolerance;Repositioned     Hand Dominance Right   Extremity/Trunk Assessment Upper Extremity Assessment Upper Extremity Assessment: RUE deficits/detail;LUE deficits/detail RUE Deficits / Details: RUE immobilized in sling with NWB precautions RUE: Unable to fully assess due to immobilization;Unable to fully assess due to pain RUE Coordination: decreased gross motor;decreased fine motor LUE Deficits / Details: Velcro Splint LUE, full L shoulder flexion AROM and elbow AROM; LUE WBAT LUE: Unable to fully assess due to pain;Unable to fully assess due to immobilization LUE Coordination: decreased fine motor   Lower Extremity Assessment Lower Extremity Assessment: Defer to PT evaluation   Cervical / Trunk Assessment Cervical / Trunk Assessment: Kyphotic   Communication Communication Communication: No difficulties   Cognition Arousal/Alertness: Awake/alert;Suspect due to medications Behavior During Therapy: [facility] for tasks assessed/performed Overall Cognitive Status: Within Functional Limits for tasks assessed                                 General Comments: Initially thought she was at home, but with verbal cue acknowledged she was at the hospital   General Comments       Exercises     Shoulder [place] expects to be discharged to:: Skilled nursing facility Living Arrangements: Spouse/significant other                               Additional Comments: Pt lives with her husband who uses a RW to ambulate.  Wife reports she does not have to assist her husband with ADLs but  pt does all the cooking, cleaning. 1 STE no rails, walk-in shower w/ grab bars, elevated toilet      Prior Functioning/Environment Level of Independence: Independent with assistive device(s)        Comments: Pt reports ambulating w/ rollator "sometimes"; denies any other falls in past year; active doing water aerobics 2x/wk        OT Problem List: Decreased strength;Pain;Decreased coordination;Decreased range of motion;Decreased activity tolerance;Impaired balance (sitting and/or standing);Decreased knowledge of use of DME or AE;Impaired UE functional use      OT Treatment/Interventions: Self-care/ADL training;Therapeutic exercise;Therapeutic activities;Energy conservation;DME and/or AE instruction;Patient/family education    OT Goals(Current goals can be found in the care plan section) Acute Rehab OT Goals Patient Stated Goal: rehab before home, to return to PLOF OT Goal Formulation: With patient Time For Goal Achievement: 07/23/16 Potential to Achieve Goals: Good  OT Frequency: Min 1X/week   Barriers to D/C: Inaccessible home environment;Decreased caregiver support          Co-evaluation              End of Session Equipment Utilized During Treatment: Gait belt  Activity Tolerance: Patient limited by fatigue;Patient limited by pain Patient left: in bed;with call bell/phone within reach;with bed alarm set  OT Visit Diagnosis: Other abnormalities of gait and mobility (R26.89);Muscle weakness (generalized) (M62.81);History of falling (Z91.81);Pain Pain - Right/Left: Right Pain - part of body: Shoulder;Arm;Hand (and L wrist)                Time: 1438-8875 OT Time Calculation (min): 32 min Charges:  OT General Charges $OT Visit: 1 Procedure OT Evaluation $OT Eval Moderate Complexity: 1 Procedure OT Treatments $Self Care/Home Management : 8-22 mins G-Codes: OT G-codes **NOT FOR INPATIENT CLASS** Functional Assessment Tool Used: Clinical judgement;AM-PAC 6 Clicks  Daily Activity Functional Limitation: Self care Self Care Current Status (Z9728): At least 40 percent but less than 60 percent impaired, limited or restricted Self Care Goal Status (A0601): At least 40 percent but less than 60 percent impaired, limited or restricted   Jeni Salles, MPH, MS, OTR/L ascom [phone] 07/09/16, 4:27 PM

## 2016-07-10 LAB — BASIC METABOLIC PANEL
Anion gap: 3 — ABNORMAL LOW (ref 5–15)
BUN: 21 mg/dL — ABNORMAL HIGH (ref 6–20)
CHLORIDE: 113 mmol/L — AB (ref 101–111)
CO2: 24 mmol/L (ref 22–32)
CREATININE: 0.79 mg/dL (ref 0.44–1.00)
Calcium: 8 mg/dL — ABNORMAL LOW (ref 8.9–10.3)
GFR calc Af Amer: >60 mL/min (ref >60)
GLUCOSE: 106 mg/dL — AB (ref 65–99)
Potassium: 3.2 mmol/L — ABNORMAL LOW (ref 3.5–5.1)
SODIUM: 140 mmol/L (ref 135–145)

## 2016-07-10 LAB — CBC
HCT: 25.9 % — ABNORMAL LOW (ref 35.0–47.0)
Hemoglobin: 8.9 g/dL — ABNORMAL LOW (ref 12.0–16.0)
MCH: 31.2 pg (ref 26.0–34.0)
MCHC: 34.5 g/dL (ref 32.0–36.0)
MCV: 90.4 fL (ref 80.0–100.0)
PLATELETS: 68 10*3/uL — AB (ref 150–440)
RBC: 2.86 MIL/uL — ABNORMAL LOW (ref 3.80–5.20)
RDW: 14.8 % — ABNORMAL HIGH (ref 11.5–14.5)
WBC: 4.1 10*3/uL (ref 3.6–11.0)

## 2016-07-10 MED ORDER — HYDROCODONE-ACETAMINOPHEN 5-325 MG PO TABS: 0.5000 | ORAL_TABLET | Freq: Four times a day (QID) | ORAL | Status: DC | PRN

## 2016-07-10 MED ORDER — IBUPROFEN 400 MG PO TABS: 400.0000 mg | ORAL_TABLET | Freq: Four times a day (QID) | ORAL | 0 refills | Status: DC | PRN

## 2016-07-10 MED ORDER — TRAMADOL-ACETAMINOPHEN 37.5-325 MG PO TABS
1.0000 | ORAL_TABLET | ORAL | Status: DC | PRN
  Administered 2016-07-10: 1 via ORAL
  Filled 2016-07-10: qty 1

## 2016-07-10 MED ORDER — TRAMADOL-ACETAMINOPHEN 37.5-325 MG PO TABS: 1.0000 | ORAL_TABLET | ORAL | 0 refills | Status: DC | PRN

## 2016-07-10 MED ORDER — HYDROCODONE-ACETAMINOPHEN 5-325 MG PO TABS: 1.0000 | ORAL_TABLET | Freq: Four times a day (QID) | ORAL | Status: DC | PRN

## 2016-07-10 MED ORDER — POTASSIUM CHLORIDE CRYS ER 20 MEQ PO TBCR
40.0000 meq | EXTENDED_RELEASE_TABLET | Freq: Once | ORAL | Status: DC
  Filled 2016-07-10: qty 2

## 2016-07-10 MED ORDER — ALUM & MAG HYDROXIDE-SIMETH 200-200-20 MG/5ML PO SUSP
30.0000 mL | Freq: Four times a day (QID) | ORAL | Status: DC | PRN
  Administered 2016-07-10: 30 mL via ORAL
  Filled 2016-07-10: qty 30

## 2016-07-10 MED ORDER — ALPRAZOLAM 0.25 MG PO TABS: 0.2500 mg | ORAL_TABLET | Freq: Every evening | ORAL | 0 refills | Status: AC | PRN

## 2016-07-10 MED ORDER — POLYETHYLENE GLYCOL 3350 17 G PO PACK: 17.0000 g | PACK | Freq: Every day | ORAL | 0 refills | Status: AC | PRN

## 2016-07-10 NOTE — Progress Notes (Signed)
Patient is medically stable for D/C to [facility] today. Per [facility] admissions coordinator at [facility] patient can come today to room 204-B. RN will call report at [phone] and arrange EMS for transport. Clinical Education officer, museum (CSW) sent D/C orders to [employer] via [facility]. Patient is aware of above. Patient's son Cherlynn Kaiser is at bedside and aware of D/C today. Please reconsult if future social work needs arise. CSW signing off.   [employer], LCSW [phone]

## 2016-07-10 NOTE — Plan of Care (Signed)
Problem: Bowel/Gastric: Goal: Will not experience complications related to bowel motility Outcome: Completed/Met Date Met: 07/10/16 Pt has met goals for discharge to rehab to await surgical intervention.

## 2016-07-10 NOTE — Progress Notes (Signed)
Pt is dc'd via ems at this time.

## 2016-07-10 NOTE — Progress Notes (Signed)
Shift assessment completed at 0730. Pt is awake, alert and oriented, c/o pain to her r shoulder area and her l wrist, is able to move fingers on both hads and has sensation intact. Sling intact to R arm, and splint intact to l wrist. piv intact to l hand with iv ns infusing. Pt is on room air, lungs are clear bilat, hr is regular, abdomen is soft, bs hypoactive. Pt voiding via bedpan,ppp, no edema noted except some slight edema to l hand/fingers. This Probation officer assissted pt with ordering breakfast. Dr. Tressia Miners in to see pt and ivf dc'd based on pt's complaint of increased swelling to l hand this morning. Son arrived at bedside, pt has received ultracet for pain since norco is listed as allergy, Dr. Tressia Miners discussed pain mgt with pt and son along with probable d/c this afternoon. Pt is anxious, c/o indigestion at this time, refused nausea medication but requested med for the indigestion. Dr. Raliegh Ip text paged by this writer.

## 2016-07-10 NOTE — Clinical Social Work Placement (Signed)
   CLINICAL SOCIAL WORK PLACEMENT  NOTE  Date:  07/10/2016  Patient Details  Name: Teresa Franklin MRN: [id number] Date of Birth: May 30, 1930  Clinical Social Work is seeking post-discharge placement for this patient at the [place] level of care (*CSW will initial, date and re-position this form in  chart as items are completed):  Yes   Patient/family provided with [place] Work Department's list of facilities offering this level of care within the geographic area requested by the patient (or if unable, by the patient's family).  Yes   Patient/family informed of their freedom to choose among providers that offer the needed level of care, that participate in Medicare, Medicaid or managed care program needed by the patient, have an available bed and are willing to accept the patient.  Yes   Patient/family informed of [facility]'s ownership interest in [facility] and [facility], as well as of the fact that they are under no obligation to receive care at these facilities.  PASRR submitted to EDS on       PASRR number received on       Existing PASRR number confirmed on 07/08/16     FL2 transmitted to all facilities in geographic area requested by pt/family on 07/08/16     FL2 transmitted to all facilities within larger geographic area on       Patient informed that his/her managed care company has contracts with or will negotiate with certain facilities, including the following:        Yes   Patient/family informed of bed offers received.  Patient chooses bed at  [facility] )     Physician recommends and patient chooses bed at      Patient to be transferred to  [facility] ) on 07/10/16.  Patient to be transferred to facility by  [facility] )     Patient family notified on 07/10/16 of transfer.  Name of family member notified:   (Patient's son Cherlynn Kaiser is at bedside and aware of D/C today. )     PHYSICIAN        Additional Comment:    _______________________________________________ Rmani Kellogg, Veronia Beets, LCSW 07/10/2016, 11:14 AM

## 2016-07-10 NOTE — Progress Notes (Signed)
[place] at [place] NAME: Teresa Franklin    MR#:  [id number]  DATE OF BIRTH:  1930/06/01  SUBJECTIVE:  CHIEF COMPLAINT:   Chief Complaint  Patient presents with  . Elbow Pain   - appears miserable due to left wrist swelling and pain today - allergic to codeine  REVIEW OF SYSTEMS:  Review of Systems  Constitutional: Negative for chills, fever and malaise/fatigue.  HENT: Negative for ear discharge and hearing loss.   Eyes: Negative for blurred vision and double vision.  Respiratory: Negative for cough, shortness of breath and wheezing.   Cardiovascular: Negative for chest pain, palpitations and leg swelling.  Gastrointestinal: Negative for abdominal pain, constipation, diarrhea, nausea and vomiting.  Genitourinary: Negative for dysuria.  Musculoskeletal: Positive for joint pain.  Neurological: Negative for dizziness, speech change, focal weakness, seizures and headaches.    DRUG ALLERGIES:   Allergies  Allergen Reactions  . Codeine Nausea And Vomiting  . Flomax [Tamsulosin] Other (See Comments)    Pt states that this medication gave her a kidney infection.    Lebron Quam [Hydrocodone-Acetaminophen] Nausea And Vomiting  . Sulfa Antibiotics Nausea And Vomiting    VITALS:  Blood pressure (!) 132/37, pulse 76, temperature 98.8 F (37.1 C), temperature source Oral, resp. rate 17, height 5\' 2"  (1.575 m), weight 57.4 kg (126 lb 9.6 oz), SpO2 97 %.  PHYSICAL EXAMINATION:  Physical Exam  GENERAL:  83 y.o.-year-old patient lying in the bed with no acute distress.  EYES: Pupils equal, round, reactive to light and accommodation. No scleral icterus. Extraocular muscles intact.  HEENT: Head atraumatic, normocephalic. Oropharynx and nasopharynx clear.  NECK:  Supple, no jugular venous distention. No thyroid enlargement, no tenderness.  LUNGS: Normal breath sounds bilaterally, no wheezing, rales,rhonchi or crepitation. No use of accessory  muscles of respiration.  CARDIOVASCULAR: S1, S2 normal. No rubs, or gallops. 2/6 systolic murmur present ABDOMEN: Soft, nontender, nondistended. Bowel sounds present. No organomegaly or mass.  EXTREMITIES: No pedal edema, cyanosis, or clubbing. Right arm in a sling, left wrist in a volar splint NEUROLOGIC: Cranial nerves II through XII are intact. Muscle strength 5/5 in all extremities. Sensation intact. Gait not checked.  PSYCHIATRIC: The patient is alert and oriented x 3.  SKIN: No obvious rash, lesion, or ulcer.    LABORATORY PANEL:   CBC  Recent Labs Lab 07/10/16 0605  WBC 4.1  HGB 8.9*  HCT 25.9*  PLT 68*   ------------------------------------------------------------------------------------------------------------------  Chemistries   Recent Labs Lab 07/10/16 0605  NA 140  K 3.2*  CL 113*  CO2 24  GLUCOSE 106*  BUN 21*  CREATININE 0.79  CALCIUM 8.0*   ------------------------------------------------------------------------------------------------------------------  Cardiac Enzymes No results for input(s): TROPONINI in the last 168 hours. ------------------------------------------------------------------------------------------------------------------  RADIOLOGY:  Ct Elbow Right Wo Contrast  Result Date: 07/08/2016 CLINICAL DATA:  Assess complex elbow fractures.  Fell yesterday. EXAM: CT OF THE LOWER RIGHT EXTREMITY WITHOUT CONTRAST TECHNIQUE: Multidetector CT imaging of the right lower extremity was performed according to the standard protocol. COMPARISON:  Radiographs 07/07/2016 FINDINGS: Bones/Joint/Cartilage Complex comminuted fracture of the olecranon wound Ob along. There is a vertical split with widening of the olecranon fossa. The coronoid process region is markedly comminuted and mildly displaced. Impaction type fracture involving the radial neck but no significant displacement. No obvious intra-articular involvement. The distal humerus is intact. There is a  large joint effusion and there is air in the joint and soft tissues consistent with an  open injury. Ligaments Suboptimally assessed by CT. Muscles and Tendons No obvious muscle tears but there is air noted in the triceps region an in the forearm musculature likely related to an open injury. Soft tissues Extensive subcutaneous soft tissue swelling/ edema/fluid/ hematoma. IMPRESSION: Complex comminuted displaced fracture of the olecranon. Nondisplaced and slightly impacted radial neck fracture but no intra-articular involvement. Intra-articular and intramuscular air consistent with an open injury. Electronically Signed   By: Marijo Sanes M.D.   On: 07/08/2016 09:44    EKG:   Orders placed or performed in visit on 06/15/16  . EKG 12-Lead    ASSESSMENT AND PLAN:   83 y/o F with PMH of CAD, hypertension, AV block, generalized anxiety, hypokalemia, subdural hemorrhage, stroke admitted after a fall and right ulnar fractures.  #1 Right proximal ulnar fracture, radial head subluxation, left triquetral fractures- appreciate ortho consult - ice pack to left wrist swelling, keep it elevated - right arm in sling- f/u with hand surgeon in 4 days - PT and OT consults appreciated  #2 Hypertension- continue home meds  #3 CAD- stable. Continue asa, plavix, statin, coreg  #4 GERD- PPI  #5 Hypothyroidism- continue synthroid  #6 DVT Prophylaxis- lovenox  Likely discharge to [facility] rehab today    All the records are reviewed and case discussed with Care Management/Social Workerr. Management plans discussed with the patient, family and they are in agreement.  CODE STATUS:  Full Code  TOTAL TIME TAKING CARE OF THIS PATIENT: 38 minutes.   POSSIBLE D/C TODAY, DEPENDING ON CLINICAL CONDITION.   Gladstone Lighter M.D on 07/10/2016 at 9:31 AM  Between 7am to 6pm - Pager - [phone]  After 6pm go to [email or web address] - password [email or web address] [facility]  Office   [phone]  CC: Primary care physician; [name], MD

## 2016-07-10 NOTE — Progress Notes (Signed)
This Probation officer called [employer] and gave report to [place]. EMS had been called. This Probation officer removed piv from pt;s hand with catheter intact. Pt is awaiting transport.

## 2016-07-10 NOTE — Care Management Important Message (Signed)
Important Message  Patient Details  Name: Teresa Franklin MRN: [id number] Date of Birth: Jun 25, 1930   Medicare Important Message Given:  Yes    Jolly Mango, RN 07/10/2016, 9:12 AM

## 2016-07-10 NOTE — Discharge Summary (Signed)
[place] at [place] NAME: Teresa Franklin    MR#:  [id number]  DATE OF BIRTH:  05-02-1930  DATE OF ADMISSION:  07/07/2016   ADMITTING PHYSICIAN: Hillary Bow, MD  DATE OF DISCHARGE: 07/10/16  PRIMARY CARE PHYSICIAN: [name], MD   ADMISSION DIAGNOSIS:   Elbow Pain  DISCHARGE DIAGNOSIS:   Active Problems:   Elbow fracture, right   Preop cardiovascular exam   SECONDARY DIAGNOSIS:   Past Medical History:  Diagnosis Date  . Anxiety   . AV block    a. 08/2015 in setting of NSTEMI and RPAV intervention-->required temp wire but not PPM.  . Back pain   . Chronic diastolic CHF (congestive heart failure) ([place])    a. 08/2015  Echo: EF 55-60%, no rwma, mod MR, mildly dil LA/RA, mod TR, PASP 23mmHg.  Marland Kitchen Coronary artery disease    a. 08/2015 NSTEMI/PCI: LM nl, LAD 75p (med rx), 20 diffuse, 50d, LCX nl, OM1/2 nl, RCA 36m, RPAV 100 (2.25x12 Promus Premier DES) - complicated by 2:1 HB req temp wire post-PCI.  Marland Kitchen Diverticulitis   . Generalized anxiety disorder   . Hepatic steatosis   . HLD (hyperlipidemia)   . Hypertension   . Hypokalemia   . Hypomagnesemia   . Hypothyroidism   . OA (osteoarthritis)   . Osteopenia   . Sleep apnea   . Stroke [facility])    a. 08/2015 Right PCA territory infarct/temoral lobe cerebral infarction-->MRA showed severe flow liminting stenosis of R P2 cerebral artery;  b. 08/2015 Carotid U/S: 1-39% bilat ICA stenosis.  . Subdural hemorrhage ([place])    a. 05/2013 in setting of fall.  . Thrombocytopenia ([place])   . Weakness     HOSPITAL COURSE:   83 y/o F with PMH of CAD, hypertension, AV block, generalized anxiety, hypokalemia, subdural hemorrhage, stroke admitted after a fall and right ulnar fractures.  #1 Right proximal ulnar fracture, radial head subluxation, left triquetral fractures- appreciate ortho consult - ice pack to left wrist swelling, keep it elevated - right arm in sling- f/u with hand surgeon in 4  days - PT and OT consults appreciated - patient is very sensitive to narcotic pain meds  #2 Hypertension- continue home meds  #3 CAD- stable. Continue asa, plavix, statin, coreg  #4 GERD- PPI  #5 Hypothyroidism- continue synthroid  Likely discharge to [facility] rehab today   DISCHARGE CONDITIONS:   Guarded  CONSULTS OBTAINED:   Treatment Team:  [name], MD  Dr. Thornton Park  DRUG ALLERGIES:   Allergies  Allergen Reactions  . Codeine Nausea And Vomiting  . Flomax [Tamsulosin] Other (See Comments)    Pt states that this medication gave her a kidney infection.    Lebron Quam [Hydrocodone-Acetaminophen] Nausea And Vomiting  . Sulfa Antibiotics Nausea And Vomiting   DISCHARGE MEDICATIONS:   Allergies as of 07/10/2016      Reactions   Codeine Nausea And Vomiting   Flomax [tamsulosin] Other (See Comments)   Pt states that this medication gave her a kidney infection.     Norco [hydrocodone-acetaminophen] Nausea And Vomiting   Sulfa Antibiotics Nausea And Vomiting      Medication List    STOP taking these medications   furosemide 20 MG tablet Commonly known as:  LASIX   KLOR-CON M20 20 MEQ tablet Generic drug:  potassium chloride SA   simethicone 80 MG chewable tablet Commonly known as:  GAS-X     TAKE these medications  acetaminophen 325 MG tablet Commonly known as:  TYLENOL Take 650 mg by mouth every 6 (six) hours as needed for mild pain, fever or headache.   ALPRAZolam 0.25 MG tablet Commonly known as:  XANAX Take 1 tablet (0.25 mg total) by mouth at bedtime as needed for anxiety.   aspirin 81 MG chewable tablet Chew 1 tablet (81 mg total) by mouth daily.   atorvastatin 20 MG tablet Commonly known as:  LIPITOR Take 1 tablet (20 mg total) by mouth daily.   calcium-vitamin D 500-200 MG-UNIT tablet Commonly known as:  OSCAL WITH D Take 1 tablet by mouth daily with breakfast.   carvedilol 3.125 MG tablet Commonly known as:  COREG Take  3.125 mg by mouth 2 (two) times daily with a meal.   clopidogrel 75 MG tablet Commonly known as:  PLAVIX Take 1 tablet (75 mg total) by mouth daily with breakfast.   fluocinonide 0.05 % external solution Commonly known as:  LIDEX   folic acid-pyridoxine-cyancobalamin 2.5-25-2 MG Tabs tablet Commonly known as:  FOLTX Take 1 tablet by mouth daily.   ibuprofen 400 MG tablet Commonly known as:  ADVIL,MOTRIN Take 1 tablet (400 mg total) by mouth every 6 (six) hours as needed for headache or moderate pain.   levothyroxine 25 MCG tablet Commonly known as:  SYNTHROID, LEVOTHROID Take 25 mcg by mouth daily before breakfast.   lisinopril 40 MG tablet Commonly known as:  PRINIVIL,ZESTRIL Take 40 mg by mouth daily.   magnesium oxide 400 MG tablet Commonly known as:  MAG-OX Take 400 mg by mouth daily.   MULTIVITAMIN GUMMIES ADULT Chew Chew 2 each by mouth at bedtime.   PRESERVISION AREDS 2 Caps Take 1 capsule by mouth daily.   nitroGLYCERIN 0.4 MG SL tablet Commonly known as:  NITROSTAT Place 1 tablet (0.4 mg total) under the tongue every 5 (five) minutes x 3 doses as needed for chest pain.   omeprazole 20 MG capsule Commonly known as:  PRILOSEC Take 20 mg by mouth daily before breakfast.   polyethylene glycol packet Commonly known as:  MIRALAX / GLYCOLAX Take 17 g by mouth daily as needed for mild constipation.   senna-docusate 8.6-50 MG tablet Commonly known as:  Senokot-S Take 1 tablet by mouth as needed.   sodium chloride 0.65 % Soln nasal spray Commonly known as:  OCEAN Place 1 spray into both nostrils as needed for congestion.   traMADol-acetaminophen 37.5-325 MG tablet Commonly known as:  ULTRACET Take 1 tablet by mouth every 4 (four) hours as needed for severe pain.        DISCHARGE INSTRUCTIONS:   1. PCP f/u in 1-2 weeks 2. Ortho- hand surgeon f/u in 4 days  DIET:   Cardiac diet  ACTIVITY:   Activity as tolerated  OXYGEN:   Home Oxygen: No.    Oxygen Delivery: room air  DISCHARGE LOCATION:   nursing home   If you experience worsening of your admission symptoms, develop shortness of breath, life threatening emergency, suicidal or homicidal thoughts you must seek medical attention immediately by calling 911 or calling your MD immediately  if symptoms less severe.  You Must read complete instructions/literature along with all the possible adverse reactions/side effects for all the Medicines you take and that have been prescribed to you. Take any new Medicines after you have completely understood and accpet all the possible adverse reactions/side effects.   Please note  You were cared for by a hospitalist during your hospital stay. If you have  any questions about your discharge medications or the care you received while you were in the hospital after you are discharged, you can call the unit and asked to speak with the hospitalist on call if the hospitalist that took care of you is not available. Once you are discharged, your primary care physician will handle any further medical issues. Please note that NO REFILLS for any discharge medications will be authorized once you are discharged, as it is imperative that you return to your primary care physician (or establish a relationship with a primary care physician if you do not have one) for your aftercare needs so that they can reassess your need for medications and monitor your lab values.    On the day of Discharge:  VITAL SIGNS:   Blood pressure (!) 132/37, pulse 76, temperature 98.8 F (37.1 C), temperature source Oral, resp. rate 17, height 5\' 2"  (1.575 m), weight 57.4 kg (126 lb 9.6 oz), SpO2 97 %.  PHYSICAL EXAMINATION:    GENERAL:  83 y.o.-year-old patient lying in the bed with no acute distress.  EYES: Pupils equal, round, reactive to light and accommodation. No scleral icterus. Extraocular muscles intact.  HEENT: Head atraumatic, normocephalic. Oropharynx and nasopharynx  clear.  NECK:  Supple, no jugular venous distention. No thyroid enlargement, no tenderness.  LUNGS: Normal breath sounds bilaterally, no wheezing, rales,rhonchi or crepitation. No use of accessory muscles of respiration.  CARDIOVASCULAR: S1, S2 normal. No rubs, or gallops. 2/6 systolic murmur present ABDOMEN: Soft, nontender, nondistended. Bowel sounds present. No organomegaly or mass.  EXTREMITIES: No pedal edema, cyanosis, or clubbing. Right arm in a sling, left wrist in a volar splint NEUROLOGIC: Cranial nerves II through XII are intact. Muscle strength 5/5 in all extremities. Sensation intact. Gait not checked.  PSYCHIATRIC: The patient is alert and oriented x 3.  SKIN: No obvious rash, lesion, or ulcer.   DATA REVIEW:   CBC  Recent Labs Lab 07/10/16 0605  WBC 4.1  HGB 8.9*  HCT 25.9*  PLT 68*    Chemistries   Recent Labs Lab 07/10/16 0605  NA 140  K 3.2*  CL 113*  CO2 24  GLUCOSE 106*  BUN 21*  CREATININE 0.79  CALCIUM 8.0*     Microbiology Results  Results for orders placed or performed during the hospital encounter of 09/08/15  MRSA PCR Screening     Status: None   Collection Time: 09/08/15  5:03 PM  Result Value Ref Range Status   MRSA by PCR NEGATIVE NEGATIVE Final    Comment:        The GeneXpert MRSA Assay (FDA approved for NASAL specimens only), is one component of a comprehensive MRSA colonization surveillance program. It is not intended to diagnose MRSA infection nor to guide or monitor treatment for MRSA infections.   Urine culture     Status: Abnormal   Collection Time: 09/15/15 12:33 PM  Result Value Ref Range Status   Specimen Description URINE, RANDOM  Final   Special Requests ADDED [id number]  Final   Culture MULTIPLE SPECIES PRESENT, SUGGEST RECOLLECTION (A)  Final   Report Status 09/17/2015 FINAL  Final    RADIOLOGY:  No results found.   Management plans discussed with the patient, family and they are in agreement.  CODE  STATUS:     Code Status Orders        Start     Ordered   07/07/16 1917  Full code  Continuous     07/07/16  0370    Code Status History    Date Active Date Inactive Code Status Order ID Comments User Context   09/10/2015  2:08 PM 09/18/2015  7:35 PM DNR [id number]  Raylene Miyamoto, MD Inpatient   09/08/2015  4:15 PM 09/10/2015  2:08 PM Partial Code [id number]  Lonn Georgia, PA-C Inpatient   09/07/2015  4:42 PM 09/08/2015  4:15 PM DNR [id number]  Lytle Butte, MD Inpatient   09/06/2015  4:26 PM 09/07/2015  4:42 PM Full Code [id number]  Bettey Costa, MD ED    Advance Directive Documentation     Most Recent Value  Type of Advance Directive  Living will  Pre-existing out of facility DNR order (yellow form or pink MOST form)  -  "MOST" Form in Place?  -      TOTAL TIME TAKING CARE OF THIS PATIENT: 37 minutes.    Gladstone Lighter M.D on 07/10/2016 at 9:41 AM  Between 7am to 6pm - Pager - [phone]  After 6pm go to [email or web address] - Proofreader  [facility]  Office  [phone]  CC: Primary care physician; [name], MD   Note: This dictation was prepared with Dragon dictation along with smaller phrase technology. Any transcriptional errors that result from this process are unintentional.

## 2016-07-13 DIAGNOSIS — M8000XD Age-related osteoporosis with current pathological fracture, unspecified site, subsequent encounter for fracture with routine healing: Secondary | ICD-10-CM | POA: Insufficient documentation

## 2016-07-21 ENCOUNTER — Encounter
Admission: RE | Admit: 2016-07-21 | Discharge: 2016-07-21 | Disposition: A | Discharge status: Home / Self Care | Payer: Medicare Other | Source: Ambulatory Visit | Attending: Internal Medicine | Admitting: Internal Medicine

## 2016-07-28 ENCOUNTER — Non-Acute Institutional Stay (SKILLED_NURSING_FACILITY): Payer: Medicare Other | Admitting: Gerontology

## 2016-07-28 DIAGNOSIS — F419 Anxiety disorder, unspecified: Secondary | ICD-10-CM | POA: Diagnosis not present

## 2016-07-28 DIAGNOSIS — S42401D Unspecified fracture of lower end of right humerus, subsequent encounter for fracture with routine healing: Secondary | ICD-10-CM

## 2016-08-05 ENCOUNTER — Non-Acute Institutional Stay (SKILLED_NURSING_FACILITY): Payer: Medicare Other | Admitting: Gerontology

## 2016-08-05 DIAGNOSIS — F419 Anxiety disorder, unspecified: Secondary | ICD-10-CM

## 2016-08-05 DIAGNOSIS — S42401D Unspecified fracture of lower end of right humerus, subsequent encounter for fracture with routine healing: Secondary | ICD-10-CM | POA: Diagnosis not present

## 2016-08-05 NOTE — Progress Notes (Signed)
Location:      Place of Service:  SNF (31) Provider:  Toni Arthurs, NP-C  [name], MD  Patient Care Team: [name], MD as PCP - General (Family Medicine)  Extended Emergency Contact Information Primary Emergency Contact: Binnie Kand [place] of [place] Phone: [phone] Relation: Son  Code Status:  full Goals of care: Advanced Directive information Advanced Directives 07/07/2016  Does Patient Have a Medical Advance Directive? Yes  Type of Advance Directive Living will  Does patient want to make changes to medical advance directive? No - Patient declined  Copy of [place] in Chart? -  Would patient like information on creating a medical advance directive? No - Patient declined     Chief Complaint  Patient presents with  . Follow-up    HPI:  Pt is a 83 y.o. female seen today for follow up visit. Pt was admitted to the facility for rehab following admission to the hospital for elbow fracture. She initially was in a splint to allow for swelling to diminish. She later had surgical repair of the elbow- ORIF. Pt had severe anxiety and agitation upon admission. I changed her xanax to Q 4 hours prn rather than just daily prn. Pt has taken several doses and seems more calm and relaxed. On interview, she is smiling and reported her pain is much better and that she is feeling much better. Arms were in the splints. Radial pulses equal and strong bilaterally. Good cap refills. She is eating well and having regular BMs. Pt denies n/v/d/f/c/cp/sob/ha/abd pain/dizziness. VSS. No other complaints.      Past Medical History:  Diagnosis Date  . Anxiety   . AV block    a. 08/2015 in setting of NSTEMI and RPAV intervention-->required temp wire but not PPM.  . Back pain   . Chronic diastolic CHF (congestive heart failure) ([place])    a. 08/2015  Echo: EF 55-60%, no rwma, mod MR, mildly dil LA/RA, mod TR, PASP 7mmHg.  Marland Kitchen Coronary artery disease    a.  08/2015 NSTEMI/PCI: LM nl, LAD 75p (med rx), 20 diffuse, 50d, LCX nl, OM1/2 nl, RCA 41m, RPAV 100 (2.25x12 Promus Premier DES) - complicated by 2:1 HB req temp wire post-PCI.  Marland Kitchen Diverticulitis   . Generalized anxiety disorder   . Hepatic steatosis   . HLD (hyperlipidemia)   . Hypertension   . Hypokalemia   . Hypomagnesemia   . Hypothyroidism   . OA (osteoarthritis)   . Osteopenia   . Sleep apnea   . Stroke [facility])    a. 08/2015 Right PCA territory infarct/temoral lobe cerebral infarction-->MRA showed severe flow liminting stenosis of R P2 cerebral artery;  b. 08/2015 Carotid U/S: 1-39% bilat ICA stenosis.  . Subdural hemorrhage ([place])    a. 05/2013 in setting of fall.  . Thrombocytopenia ([place])   . Weakness    Past Surgical History:  Procedure Laterality Date  . CARDIAC CATHETERIZATION N/A 09/08/2015   Procedure: Temporary Pacemaker;  Surgeon: Leonie Man, MD;  Location: [place] [facility];  Service: Cardiovascular;  Laterality: N/A;  . CARDIAC CATHETERIZATION N/A 09/08/2015   Procedure: Left Heart Cath and Coronary Angiography;  Surgeon: Leonie Man, MD;  Location: [facility];  Service: Cardiovascular;  Laterality: N/A;  . CARDIAC CATHETERIZATION N/A 09/08/2015   Procedure: Coronary Stent Intervention;  Surgeon: Leonie Man, MD;  Location: [place] [facility];  Service: Cardiovascular;  Laterality: N/A;  . CARDIAC CATHETERIZATION  09/08/2015  Procedure: Central Line Insertion;  Surgeon: Leonie Man, MD;  Location: [facility];  Service: Cardiovascular;;  . CARDIAC CATHETERIZATION  09/08/2015   Procedure: Arterial Line Insertion;  Surgeon: Leonie Man, MD;  Location: [facility];  Service: Cardiovascular;;  . CARDIAC CATHETERIZATION N/A 09/09/2015   Procedure: Temporary Wire;  Surgeon: Lorretta Harp, MD;  Location: [facility];  Service: Cardiovascular;  Laterality: N/A;    Allergies  Allergen Reactions  . Codeine Nausea And Vomiting  .  Flomax [Tamsulosin] Other (See Comments)    Pt states that this medication gave her a kidney infection.    Lebron Quam [Hydrocodone-Acetaminophen] Nausea And Vomiting  . Sulfa Antibiotics Nausea And Vomiting    Allergies as of 07/28/2016      Reactions   Codeine Nausea And Vomiting   Flomax [tamsulosin] Other (See Comments)   Pt states that this medication gave her a kidney infection.     Norco [hydrocodone-acetaminophen] Nausea And Vomiting   Sulfa Antibiotics Nausea And Vomiting      Medication List       Accurate as of 07/28/16 11:59 PM. Always use your most recent med list.          acetaminophen 325 MG tablet Commonly known as:  TYLENOL Take 650 mg by mouth every 6 (six) hours as needed for mild pain, fever or headache.   ALPRAZolam 0.25 MG tablet Commonly known as:  XANAX Take 1 tablet (0.25 mg total) by mouth at bedtime as needed for anxiety.   aspirin 81 MG chewable tablet Chew 1 tablet (81 mg total) by mouth daily.   atorvastatin 20 MG tablet Commonly known as:  LIPITOR Take 1 tablet (20 mg total) by mouth daily.   calcium-vitamin D 500-200 MG-UNIT tablet Commonly known as:  OSCAL WITH D Take 1 tablet by mouth daily with breakfast.   carvedilol 3.125 MG tablet Commonly known as:  COREG Take 3.125 mg by mouth 2 (two) times daily with a meal.   clopidogrel 75 MG tablet Commonly known as:  PLAVIX Take 1 tablet (75 mg total) by mouth daily with breakfast.   fluocinonide 0.05 % external solution Commonly known as:  LIDEX   folic acid-pyridoxine-cyancobalamin 2.5-25-2 MG Tabs tablet Commonly known as:  FOLTX Take 1 tablet by mouth daily.   ibuprofen 400 MG tablet Commonly known as:  ADVIL,MOTRIN Take 1 tablet (400 mg total) by mouth every 6 (six) hours as needed for headache or moderate pain.   levothyroxine 25 MCG tablet Commonly known as:  SYNTHROID, LEVOTHROID Take 25 mcg by mouth daily before breakfast.   lisinopril 40 MG tablet Commonly known as:   PRINIVIL,ZESTRIL Take 40 mg by mouth daily.   magnesium oxide 400 MG tablet Commonly known as:  MAG-OX Take 400 mg by mouth daily.   MULTIVITAMIN GUMMIES ADULT Chew Chew 2 each by mouth at bedtime.   PRESERVISION AREDS 2 Caps Take 1 capsule by mouth daily.   nitroGLYCERIN 0.4 MG SL tablet Commonly known as:  NITROSTAT Place 1 tablet (0.4 mg total) under the tongue every 5 (five) minutes x 3 doses as needed for chest pain.   omeprazole 20 MG capsule Commonly known as:  PRILOSEC Take 20 mg by mouth daily before breakfast.   polyethylene glycol packet Commonly known as:  MIRALAX / GLYCOLAX Take 17 g by mouth daily as needed for mild constipation.   senna-docusate 8.6-50 MG tablet Commonly known as:  Senokot-S Take 1 tablet by  mouth as needed.   sodium chloride 0.65 % Soln nasal spray Commonly known as:  OCEAN Place 1 spray into both nostrils as needed for congestion.   traMADol-acetaminophen 37.5-325 MG tablet Commonly known as:  ULTRACET Take 1 tablet by mouth every 4 (four) hours as needed for severe pain.       Review of Systems  Constitutional: Negative for activity change, appetite change, chills, diaphoresis and fever.  HENT: Negative for congestion, sneezing, sore throat, trouble swallowing and voice change.   Respiratory: Negative for apnea, cough, choking, chest tightness, shortness of breath and wheezing.   Cardiovascular: Negative for chest pain, palpitations and leg swelling.  Gastrointestinal: Negative for abdominal distention, abdominal pain, constipation, diarrhea and nausea.  Genitourinary: Negative for difficulty urinating, dysuria, frequency and urgency.  Musculoskeletal: Positive for arthralgias (typical arthritis) and joint swelling. Negative for back pain, gait problem and myalgias.  Skin: Positive for wound. Negative for color change, pallor and rash.  Neurological: Negative for dizziness, tremors, syncope, speech difficulty, weakness, numbness and  headaches.  Psychiatric/Behavioral: Negative for agitation and behavioral problems.  All other systems reviewed and are negative.    There is no immunization history on file for this patient. Pertinent  Health Maintenance Due  Topic Date Due  . PNA vac Low Risk Adult (1 of 2 - PCV13) 02/16/1998  . INFLUENZA VACCINE  10/21/2016  . DEXA SCAN  Completed   No flowsheet data found. Functional Status Survey:    Vitals:   07/28/16 0830  BP: (!) 155/86  Pulse: 75  Resp: 20  Temp: 98.6 F (37 C)  SpO2: 98%  Weight: 123 lb 4.8 oz (55.9 kg)   Body mass index is 22.55 kg/m. Physical Exam  Constitutional: She is oriented to person, place, and time. Vital signs are normal. She appears well-developed and well-nourished. She is active and cooperative. She does not appear ill. No distress.  HENT:  Head: Normocephalic and atraumatic.  Mouth/Throat: Uvula is midline, oropharynx is clear and moist and mucous membranes are normal. Mucous membranes are not pale, not dry and not cyanotic.  Eyes: Conjunctivae, EOM and lids are normal. Pupils are equal, round, and reactive to light.  Neck: Trachea normal, normal range of motion and full passive range of motion without pain. Neck supple. No JVD present. No tracheal deviation, no edema and no erythema present. No thyromegaly present.  Cardiovascular: Normal rate, regular rhythm, normal heart sounds, intact distal pulses and normal pulses.  Exam reveals no gallop, no distant heart sounds and no friction rub.   No murmur heard. Pulses:      Radial pulses are 2+ on the right side, and 2+ on the left side.       Dorsalis pedis pulses are 2+ on the right side, and 2+ on the left side.  Pulmonary/Chest: Effort normal and breath sounds normal. No accessory muscle usage. No respiratory distress. She has no decreased breath sounds. She has no wheezes. She has no rhonchi. She has no rales. She exhibits no tenderness.  Abdominal: Normal appearance and bowel  sounds are normal. She exhibits no distension and no ascites. There is no tenderness.  Musculoskeletal: She exhibits no edema or tenderness.       Right elbow: She exhibits decreased range of motion and laceration (incision).  Expected osteoarthritis, stiffness; calves soft, supple. Negative Homan's sign.   Neurological: She is alert and oriented to person, place, and time. She has normal strength.  Skin: Skin is warm and dry. Laceration (Right  Elbow incision) noted. She is not diaphoretic. No cyanosis. No pallor. Nails show no clubbing.  Psychiatric: She has a normal mood and affect. Her speech is normal and behavior is normal. Judgment and thought content normal. Cognition and memory are normal.  Nursing note and vitals reviewed.   Labs reviewed:  Recent Labs  09/10/15 2145 09/11/15 0440 09/12/15 0337 09/13/15 0342  07/07/16 1953 07/09/16 1036 07/10/16 0605  NA  --  139 140 138  < > 138 134* 140  K  --  3.7 3.5 3.4*  < > 3.3* 3.1* 3.2*  CL  --  109 105 105  < > 102 100* 113*  CO2  --  22 25 25   < > 29 28 24   GLUCOSE  --  102* 132* 115*  < > 143* 140* 106*  BUN  --  28* 29* 17  < > 17 29* 21*  CREATININE  --  0.99 1.06* 0.88  < > 0.85 1.32* 0.79  CALCIUM  --  8.2* 8.6* 8.7*  < > 9.1 9.1 8.0*  MG 2.1 2.2 2.0  --   --   --   --   --   PHOS 2.1* 2.8 2.4* 2.8  --   --   --   --   < > = values in this interval not displayed.  Recent Labs  09/09/15 0350 09/10/15 0357 11/12/15 1454  AST 80* 46* 27  ALT 46 31 20  ALKPHOS 79 71 64  BILITOT 1.0 0.9 0.6  PROT 5.3* 5.0* 7.1  ALBUMIN 2.6* 2.2* 4.2    Recent Labs  02/04/16 1336 05/12/16 1344 07/07/16 1953 07/10/16 0605  WBC 4.9 5.3 9.0 4.1  NEUTROABS 3.2 3.3 8.0*  --   HGB 13.9 14.7 13.9 8.9*  HCT 41.0 43.3 40.3 25.9*  MCV 88.7 89.8 90.4 90.4  PLT 86* 112* 102* 68*   No results found for: TSH Lab Results  Component Value Date   HGBA1C 5.3 09/15/2015   Lab Results  Component Value Date   CHOL 114 09/09/2015   HDL  38 (L) 09/09/2015   LDLCALC 54 09/09/2015   TRIG 111 09/09/2015   CHOLHDL 3.0 09/09/2015    Significant Diagnostic Results in last 30 days:  Dg Shoulder Right  Result Date: 07/07/2016 CLINICAL DATA:  Status post fall. EXAM: RIGHT SHOULDER - 2+ VIEW COMPARISON:  None in PACs FINDINGS: The bones are subjectively osteopenic. There is an acute mildly angulated fracture involving the neck of the right humerus. There is pre-existing chronic deformity of the humeral head and the glenoid. The observed portions of the right clavicle and upper right ribs are intact. The [facility] joint appears intact. IMPRESSION: Acute mildly angulated fracture of the neck of the right humerus. 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 Electronically Signed   By: David  Martinique M.D.   On: 07/07/2016 17:11   Dg Elbow Complete Right  Result Date: 07/07/2016 CLINICAL DATA:  Elbow pain and deformity post fall. EXAM: RIGHT ELBOW - COMPLETE 3+ VIEW COMPARISON:  None. FINDINGS: Examination demonstrates a displaced anterior posterior fat pad. There is a complete transverse displaced fracture through the proximal ulna with mild comminution at the level of the elbow. Displaced fracture of the radial head likely with extension to the articular surface. IMPRESSION: Displaced slightly comminuted proximal ulnar fracture. Displaced radial head fracture likely with extension to the articular surface. Electronically Signed   By: Marin Olp M.D.   On: 07/07/2016  17:23   Dg Wrist Complete Left  Result Date: 07/07/2016 CLINICAL DATA:  Elbow pain and deformity post fall. Some left wrist pain. EXAM: LEFT WRIST - COMPLETE 3+ VIEW COMPARISON:  None. FINDINGS: Exam demonstrates ulnar plus variance. Mild degenerative change of the radiocarpal joint and first carpometacarpal joints. Subtle displaced fragment posteriorly on the lateral film suggesting a triquetrum bone fracture. IMPRESSION: Findings suggesting a chip  fracture of the triquetrum posteriorly. Electronically Signed   By: Marin Olp M.D.   On: 07/07/2016 17:25   Dg Wrist Complete Right  Result Date: 07/07/2016 CLINICAL DATA:  Right wrist pain due to a fall today. Initial encounter. EXAM: RIGHT WRIST - COMPLETE 3+ VIEW COMPARISON:  None. FINDINGS: No acute bony or joint abnormality is identified. The patient appears to have a remote healed fracture of the fifth metacarpal. There is some first CMC osteoarthritis. Mild triangular fibrocartilage chondrocalcinosis is identified. IMPRESSION: No acute abnormality. Electronically Signed   By: Inge Rise M.D.   On: 07/07/2016 17:10   Ct Elbow Right Wo Contrast  Result Date: 07/08/2016 CLINICAL DATA:  Assess complex elbow fractures.  Fell yesterday. EXAM: CT OF THE LOWER RIGHT EXTREMITY WITHOUT CONTRAST TECHNIQUE: Multidetector CT imaging of the right lower extremity was performed according to the standard protocol. COMPARISON:  Radiographs 07/07/2016 FINDINGS: Bones/Joint/Cartilage Complex comminuted fracture of the olecranon wound Ob along. There is a vertical split with widening of the olecranon fossa. The coronoid process region is markedly comminuted and mildly displaced. Impaction type fracture involving the radial neck but no significant displacement. No obvious intra-articular involvement. The distal humerus is intact. There is a large joint effusion and there is air in the joint and soft tissues consistent with an open injury. Ligaments Suboptimally assessed by CT. Muscles and Tendons No obvious muscle tears but there is air noted in the triceps region an in the forearm musculature likely related to an open injury. Soft tissues Extensive subcutaneous soft tissue swelling/ edema/fluid/ hematoma. IMPRESSION: Complex comminuted displaced fracture of the olecranon. Nondisplaced and slightly impacted radial neck fracture but no intra-articular involvement. Intra-articular and intramuscular air consistent  with an open injury. Electronically Signed   By: Marijo Sanes M.D.   On: 07/08/2016 09:44    Assessment/Plan 1. Closed fracture of right elbow with routine healing, subsequent encounter  Continue working with PT/OT  Pain control with Tramadol 50 mg po Q 4 hours prn  2. Anxiety disorder, unspecified type  Change Alprazolam to 0.25 mg po Q 4 hours prn- anxiety   Family/ staff Communication:   Total Time:  Documentation:  Face to Face:  Family/Phone:   Labs/tests ordered:    Medication list reviewed and assessed for continued appropriateness. Monthly medication orders reviewed and signed.  Vikki Ports, NP-C Geriatrics [facility] [phone], [place] [address] Cell Phone (Mon-Fri 8am-5pm):  [phone] On Call:  [phone] & follow prompts after 5pm & weekends Office Phone:  [phone] Office Fax:  [phone]

## 2016-08-05 NOTE — Progress Notes (Signed)
Location:      Place of Service:  SNF (31) Provider:  Toni Arthurs, NP-C  [name], MD  Patient Care Team: [name], MD as PCP - General (Family Medicine)  Extended Emergency Contact Information Primary Emergency Contact: Binnie Kand [place] Phone: [phone] Relation: Son  Code Status:  full Goals of care: Advanced Directive information Advanced Directives 07/07/2016  Does Patient Have a Medical Advance Directive? Yes  Type of Advance Directive Living will  Does patient want to make changes to medical advance directive? No - Patient declined  Copy of [place] in Chart? -  Would patient like information on creating a medical advance directive? No - Patient declined     Chief Complaint  Patient presents with  . Follow-up    HPI:  Pt is a 83 y.o. female seen today for follow up visit. Pt was admitted to the facility for rehab following admission to the hospital for elbow fracture. She initially was in a splint to allow for swelling to diminish. She later had surgical repair of the elbow- ORIF. Pt had severe anxiety and agitation upon admission. I changed her xanax to Q 4 hours prn rather than just daily prn. Pt has taken several doses and seems more calm and relaxed. On interview, she is smiling and reported her pain is much better and that she is feeling much better. Right arm was in the sling. Radial pulses equal and strong bilaterally. Good cap refills. She is eating well and having regular BMs. Pt denies n/v/d/f/c/cp/sob/ha/abd pain/dizziness. Possible discharge next week. VSS. No other complaints.      Past Medical History:  Diagnosis Date  . Anxiety   . AV block    a. 08/2015 in setting of NSTEMI and RPAV intervention-->required temp wire but not PPM.  . Back pain   . Chronic diastolic CHF (congestive heart failure) ([place])    a. 08/2015  Echo: EF 55-60%, no rwma, mod MR, mildly dil LA/RA, mod TR, PASP 70mmHg.  Marland Kitchen  Coronary artery disease    a. 08/2015 NSTEMI/PCI: LM nl, LAD 75p (med rx), 20 diffuse, 50d, LCX nl, OM1/2 nl, RCA 15m, RPAV 100 (2.25x12 Promus Premier DES) - complicated by 2:1 HB req temp wire post-PCI.  Marland Kitchen Diverticulitis   . Generalized anxiety disorder   . Hepatic steatosis   . HLD (hyperlipidemia)   . Hypertension   . Hypokalemia   . Hypomagnesemia   . Hypothyroidism   . OA (osteoarthritis)   . Osteopenia   . Sleep apnea   . Stroke [facility])    a. 08/2015 Right PCA territory infarct/temoral lobe cerebral infarction-->MRA showed severe flow liminting stenosis of R P2 cerebral artery;  b. 08/2015 Carotid U/S: 1-39% bilat ICA stenosis.  . Subdural hemorrhage ([place])    a. 05/2013 in setting of fall.  . Thrombocytopenia ([place])   . Weakness    Past Surgical History:  Procedure Laterality Date  . CARDIAC CATHETERIZATION N/A 09/08/2015   Procedure: Temporary Pacemaker;  Surgeon: Leonie Man, MD;  Location: [place] [facility];  Service: Cardiovascular;  Laterality: N/A;  . CARDIAC CATHETERIZATION N/A 09/08/2015   Procedure: Left Heart Cath and Coronary Angiography;  Surgeon: Leonie Man, MD;  Location: [place] [facility];  Service: Cardiovascular;  Laterality: N/A;  . CARDIAC CATHETERIZATION N/A 09/08/2015   Procedure: Coronary Stent Intervention;  Surgeon: Leonie Man, MD;  Location: [place] [facility];  Service: Cardiovascular;  Laterality: N/A;  .  CARDIAC CATHETERIZATION  09/08/2015   Procedure: Central Line Insertion;  Surgeon: Leonie Man, MD;  Location: [place] [facility];  Service: Cardiovascular;;  . CARDIAC CATHETERIZATION  09/08/2015   Procedure: Arterial Line Insertion;  Surgeon: Leonie Man, MD;  Location: [place] [facility];  Service: Cardiovascular;;  . CARDIAC CATHETERIZATION N/A 09/09/2015   Procedure: Temporary Wire;  Surgeon: Lorretta Harp, MD;  Location: [place] [facility];  Service: Cardiovascular;  Laterality: N/A;    Allergies  Allergen Reactions  .  Codeine Nausea And Vomiting  . Flomax [Tamsulosin] Other (See Comments)    Pt states that this medication gave her a kidney infection.    Lebron Quam [Hydrocodone-Acetaminophen] Nausea And Vomiting  . Sulfa Antibiotics Nausea And Vomiting    Allergies as of 08/05/2016      Reactions   Codeine Nausea And Vomiting   Flomax [tamsulosin] Other (See Comments)   Pt states that this medication gave her a kidney infection.     Norco [hydrocodone-acetaminophen] Nausea And Vomiting   Sulfa Antibiotics Nausea And Vomiting      Medication List       Accurate as of 08/05/16  4:24 PM. Always use your most recent med list.          acetaminophen 325 MG tablet Commonly known as:  TYLENOL Take 650 mg by mouth every 6 (six) hours as needed for mild pain, fever or headache.   ALPRAZolam 0.25 MG tablet Commonly known as:  XANAX Take 1 tablet (0.25 mg total) by mouth at bedtime as needed for anxiety.   aspirin 81 MG chewable tablet Chew 1 tablet (81 mg total) by mouth daily.   atorvastatin 20 MG tablet Commonly known as:  LIPITOR Take 1 tablet (20 mg total) by mouth daily.   calcium-vitamin D 500-200 MG-UNIT tablet Commonly known as:  OSCAL WITH D Take 1 tablet by mouth daily with breakfast.   carvedilol 3.125 MG tablet Commonly known as:  COREG Take 3.125 mg by mouth 2 (two) times daily with a meal.   clopidogrel 75 MG tablet Commonly known as:  PLAVIX Take 1 tablet (75 mg total) by mouth daily with breakfast.   fluocinonide 0.05 % external solution Commonly known as:  LIDEX   folic acid-pyridoxine-cyancobalamin 2.5-25-2 MG Tabs tablet Commonly known as:  FOLTX Take 1 tablet by mouth daily.   ibuprofen 400 MG tablet Commonly known as:  ADVIL,MOTRIN Take 1 tablet (400 mg total) by mouth every 6 (six) hours as needed for headache or moderate pain.   levothyroxine 25 MCG tablet Commonly known as:  SYNTHROID, LEVOTHROID Take 25 mcg by mouth daily before breakfast.   lisinopril 40  MG tablet Commonly known as:  PRINIVIL,ZESTRIL Take 40 mg by mouth daily.   magnesium oxide 400 MG tablet Commonly known as:  MAG-OX Take 400 mg by mouth daily.   MULTIVITAMIN GUMMIES ADULT Chew Chew 2 each by mouth at bedtime.   PRESERVISION AREDS 2 Caps Take 1 capsule by mouth daily.   nitroGLYCERIN 0.4 MG SL tablet Commonly known as:  NITROSTAT Place 1 tablet (0.4 mg total) under the tongue every 5 (five) minutes x 3 doses as needed for chest pain.   omeprazole 20 MG capsule Commonly known as:  PRILOSEC Take 20 mg by mouth daily before breakfast.   polyethylene glycol packet Commonly known as:  MIRALAX / GLYCOLAX Take 17 g by mouth daily as needed for mild constipation.   senna-docusate 8.6-50 MG tablet Commonly known  as:  Senokot-S Take 1 tablet by mouth as needed.   sodium chloride 0.65 % Soln nasal spray Commonly known as:  OCEAN Place 1 spray into both nostrils as needed for congestion.   traMADol-acetaminophen 37.5-325 MG tablet Commonly known as:  ULTRACET Take 1 tablet by mouth every 4 (four) hours as needed for severe pain.       Review of Systems  Constitutional: Negative for activity change, appetite change, chills, diaphoresis and fever.  HENT: Negative for congestion, sneezing, sore throat, trouble swallowing and voice change.   Respiratory: Negative for apnea, cough, choking, chest tightness, shortness of breath and wheezing.   Cardiovascular: Negative for chest pain, palpitations and leg swelling.  Gastrointestinal: Negative for abdominal distention, abdominal pain, constipation, diarrhea and nausea.  Genitourinary: Negative for difficulty urinating, dysuria, frequency and urgency.  Musculoskeletal: Positive for arthralgias (typical arthritis) and joint swelling. Negative for back pain, gait problem and myalgias.  Skin: Positive for wound. Negative for color change, pallor and rash.  Neurological: Negative for dizziness, tremors, syncope, speech  difficulty, weakness, numbness and headaches.  Psychiatric/Behavioral: Negative for agitation and behavioral problems.  All other systems reviewed and are negative.    There is no immunization history on file for this patient. Pertinent  Health Maintenance Due  Topic Date Due  . PNA vac Low Risk Adult (1 of 2 - PCV13) 02/16/1998  . INFLUENZA VACCINE  10/21/2016  . DEXA SCAN  Completed   No flowsheet data found. Functional Status Survey:    Vitals:   08/05/16 0520  BP: 133/67  Pulse: 88  Resp: 19  Temp: 97.5 F (36.4 C)  SpO2: 98%  Weight: 117 lb 4.8 oz (53.2 kg)   Body mass index is 21.45 kg/m. Physical Exam  Constitutional: She is oriented to person, place, and time. Vital signs are normal. She appears well-developed and well-nourished. She is active and cooperative. She does not appear ill. No distress.  HENT:  Head: Normocephalic and atraumatic.  Mouth/Throat: Uvula is midline, oropharynx is clear and moist and mucous membranes are normal. Mucous membranes are not pale, not dry and not cyanotic.  Eyes: Conjunctivae, EOM and lids are normal. Pupils are equal, round, and reactive to light.  Neck: Trachea normal, normal range of motion and full passive range of motion without pain. Neck supple. No JVD present. No tracheal deviation, no edema and no erythema present. No thyromegaly present.  Cardiovascular: Normal rate, regular rhythm, normal heart sounds, intact distal pulses and normal pulses.  Exam reveals no gallop, no distant heart sounds and no friction rub.   No murmur heard. Pulses:      Radial pulses are 2+ on the right side, and 2+ on the left side.       Dorsalis pedis pulses are 2+ on the right side, and 2+ on the left side.  Pulmonary/Chest: Effort normal and breath sounds normal. No accessory muscle usage. No respiratory distress. She has no decreased breath sounds. She has no wheezes. She has no rhonchi. She has no rales. She exhibits no tenderness.  Abdominal:  Normal appearance and bowel sounds are normal. She exhibits no distension and no ascites. There is no tenderness.  Musculoskeletal: She exhibits no edema or tenderness.       Right elbow: She exhibits decreased range of motion and laceration (incision).  Expected osteoarthritis, stiffness; calves soft, supple. Negative Homan's sign.   Neurological: She is alert and oriented to person, place, and time. She has normal strength.  Skin: Skin  is warm and dry. Laceration (Right Elbow incision) noted. No rash noted. She is not diaphoretic. No cyanosis or erythema. No pallor. Nails show no clubbing.  Psychiatric: She has a normal mood and affect. Her speech is normal and behavior is normal. Judgment and thought content normal. Cognition and memory are normal.  Nursing note and vitals reviewed.   Labs reviewed:  Recent Labs  09/10/15 2145 09/11/15 0440 09/12/15 0337 09/13/15 0342  07/07/16 1953 07/09/16 1036 07/10/16 0605  NA  --  139 140 138  < > 138 134* 140  K  --  3.7 3.5 3.4*  < > 3.3* 3.1* 3.2*  CL  --  109 105 105  < > 102 100* 113*  CO2  --  22 25 25   < > 29 28 24   GLUCOSE  --  102* 132* 115*  < > 143* 140* 106*  BUN  --  28* 29* 17  < > 17 29* 21*  CREATININE  --  0.99 1.06* 0.88  < > 0.85 1.32* 0.79  CALCIUM  --  8.2* 8.6* 8.7*  < > 9.1 9.1 8.0*  MG 2.1 2.2 2.0  --   --   --   --   --   PHOS 2.1* 2.8 2.4* 2.8  --   --   --   --   < > = values in this interval not displayed.  Recent Labs  09/09/15 0350 09/10/15 0357 11/12/15 1454  AST 80* 46* 27  ALT 46 31 20  ALKPHOS 79 71 64  BILITOT 1.0 0.9 0.6  PROT 5.3* 5.0* 7.1  ALBUMIN 2.6* 2.2* 4.2    Recent Labs  02/04/16 1336 05/12/16 1344 07/07/16 1953 07/10/16 0605  WBC 4.9 5.3 9.0 4.1  NEUTROABS 3.2 3.3 8.0*  --   HGB 13.9 14.7 13.9 8.9*  HCT 41.0 43.3 40.3 25.9*  MCV 88.7 89.8 90.4 90.4  PLT 86* 112* 102* 68*   No results found for: TSH Lab Results  Component Value Date   HGBA1C 5.3 09/15/2015   Lab  Results  Component Value Date   CHOL 114 09/09/2015   HDL 38 (L) 09/09/2015   LDLCALC 54 09/09/2015   TRIG 111 09/09/2015   CHOLHDL 3.0 09/09/2015    Significant Diagnostic Results in last 30 days:  Dg Shoulder Right  Result Date: 07/07/2016 CLINICAL DATA:  Status post fall. EXAM: RIGHT SHOULDER - 2+ VIEW COMPARISON:  None in PACs FINDINGS: The bones are subjectively osteopenic. There is an acute mildly angulated fracture involving the neck of the right humerus. There is pre-existing chronic deformity of the humeral head and the glenoid. The observed portions of the right clavicle and upper right ribs are intact. The [facility] joint appears intact. IMPRESSION: Acute mildly angulated fracture of the neck of the right humerus. 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 Electronically Signed   By: David  Martinique M.D.   On: 07/07/2016 17:11   Dg Elbow Complete Right  Result Date: 07/07/2016 CLINICAL DATA:  Elbow pain and deformity post fall. EXAM: RIGHT ELBOW - COMPLETE 3+ VIEW COMPARISON:  None. FINDINGS: Examination demonstrates a displaced anterior posterior fat pad. There is a complete transverse displaced fracture through the proximal ulna with mild comminution at the level of the elbow. Displaced fracture of the radial head likely with extension to the articular surface. IMPRESSION: Displaced slightly comminuted proximal ulnar fracture. Displaced radial head fracture likely with extension to the articular surface. Electronically Signed  By: Marin Olp M.D.   On: 07/07/2016 17:23   Dg Wrist Complete Left  Result Date: 07/07/2016 CLINICAL DATA:  Elbow pain and deformity post fall. Some left wrist pain. EXAM: LEFT WRIST - COMPLETE 3+ VIEW COMPARISON:  None. FINDINGS: Exam demonstrates ulnar plus variance. Mild degenerative change of the radiocarpal joint and first carpometacarpal joints. Subtle displaced fragment posteriorly on the lateral film suggesting a  triquetrum bone fracture. IMPRESSION: Findings suggesting a chip fracture of the triquetrum posteriorly. Electronically Signed   By: Marin Olp M.D.   On: 07/07/2016 17:25   Dg Wrist Complete Right  Result Date: 07/07/2016 CLINICAL DATA:  Right wrist pain due to a fall today. Initial encounter. EXAM: RIGHT WRIST - COMPLETE 3+ VIEW COMPARISON:  None. FINDINGS: No acute bony or joint abnormality is identified. The patient appears to have a remote healed fracture of the fifth metacarpal. There is some first CMC osteoarthritis. Mild triangular fibrocartilage chondrocalcinosis is identified. IMPRESSION: No acute abnormality. Electronically Signed   By: Inge Rise M.D.   On: 07/07/2016 17:10   Ct Elbow Right Wo Contrast  Result Date: 07/08/2016 CLINICAL DATA:  Assess complex elbow fractures.  Fell yesterday. EXAM: CT OF THE LOWER RIGHT EXTREMITY WITHOUT CONTRAST TECHNIQUE: Multidetector CT imaging of the right lower extremity was performed according to the standard protocol. COMPARISON:  Radiographs 07/07/2016 FINDINGS: Bones/Joint/Cartilage Complex comminuted fracture of the olecranon wound Ob along. There is a vertical split with widening of the olecranon fossa. The coronoid process region is markedly comminuted and mildly displaced. Impaction type fracture involving the radial neck but no significant displacement. No obvious intra-articular involvement. The distal humerus is intact. There is a large joint effusion and there is air in the joint and soft tissues consistent with an open injury. Ligaments Suboptimally assessed by CT. Muscles and Tendons No obvious muscle tears but there is air noted in the triceps region an in the forearm musculature likely related to an open injury. Soft tissues Extensive subcutaneous soft tissue swelling/ edema/fluid/ hematoma. IMPRESSION: Complex comminuted displaced fracture of the olecranon. Nondisplaced and slightly impacted radial neck fracture but no  intra-articular involvement. Intra-articular and intramuscular air consistent with an open injury. Electronically Signed   By: Marijo Sanes M.D.   On: 07/08/2016 09:44    Assessment/Plan 1. Closed fracture of right elbow with routine healing, subsequent encounter  Continue working with PT/OT  Pain control with Tramadol 50 mg po Q 4 hours prn  Ice to the elbow prn for pain, edema  Exercise fingers frequently  2. Anxiety disorder, unspecified type  Continue Alprazolam to 0.25 mg po Q 4 hours prn- anxiety   Family/ staff Communication:   Total Time:  Documentation:  Face to Face:  Family/Phone:   Labs/tests ordered:    Medication list reviewed and assessed for continued appropriateness. Monthly medication orders reviewed and signed.  Vikki Ports, NP-C Geriatrics [facility] [phone], [place] [address] Cell Phone (Mon-Fri 8am-5pm):  [phone] On Call:  [phone] & follow prompts after 5pm & weekends Office Phone:  [phone] Office Fax:  [phone]

## 2016-08-06 ENCOUNTER — Telehealth: Payer: Self-pay | Admitting: *Deleted

## 2016-08-06 NOTE — Telephone Encounter (Addendum)
Dr. Aletha Halim team made aware pt cnl apt.  ----- Message from [place] sent at 08/06/2016 10:18 AM EDT ----- FYI-Pt called and canceled her Tuesday [place] [facility]/MD appts-in hospital-thx

## 2016-08-11 ENCOUNTER — Ambulatory Visit: Payer: Medicare Other | Admitting: Internal Medicine

## 2016-08-11 ENCOUNTER — Other Ambulatory Visit: Payer: Medicare Other

## 2016-08-12 ENCOUNTER — Non-Acute Institutional Stay (SKILLED_NURSING_FACILITY): Payer: Medicare Other | Admitting: Gerontology

## 2016-08-12 DIAGNOSIS — S42401D Unspecified fracture of lower end of right humerus, subsequent encounter for fracture with routine healing: Secondary | ICD-10-CM | POA: Diagnosis not present

## 2016-08-12 DIAGNOSIS — F419 Anxiety disorder, unspecified: Secondary | ICD-10-CM

## 2016-08-12 NOTE — Progress Notes (Signed)
Location:      Place of Service:  SNF (31) Provider:  Toni Arthurs, NP-C  [name], MD  Patient Care Team: [name], MD as PCP - General (Family Medicine)  Extended Emergency Contact Information Primary Emergency Contact: Binnie Kand [place] Phone: [phone] Relation: Son  Code Status:  full Goals of care: Advanced Directive information Advanced Directives 07/07/2016  Does Patient Have a Medical Advance Directive? Yes  Type of Advance Directive Living will  Does patient want to make changes to medical advance directive? No - Patient declined  Copy of [place] in Chart? -  Would patient like information on creating a medical advance directive? No - Patient declined     Chief Complaint  Patient presents with  . Discharge Note    HPI:  Pt is a 83 y.o. female seen today for discharge evaluation visit. Pt was admitted to the facility for rehab following admission to the hospital for elbow fracture. She initially was in a splint to allow for swelling to diminish. She later had surgical repair of the elbow- ORIF. Pt had severe anxiety and agitation upon admission. I changed her xanax to Q 4 hours prn rather than just daily prn. Pt has taken several doses and seems more calm and relaxed. On interview, she is smiling and reported her pain is much better and that she is feeling much better. Right arm was in the sling. Radial pulses equal and strong bilaterally. Good cap refills. She is eating well and having regular BMs. Pt denies n/v/d/f/c/cp/sob/ha/abd pain/dizziness. She is excited about going home today. VSS. No other complaints.      Past Medical History:  Diagnosis Date  . Anxiety   . AV block    a. 08/2015 in setting of NSTEMI and RPAV intervention-->required temp wire but not PPM.  . Back pain   . Chronic diastolic CHF (congestive heart failure) ([place])    a. 08/2015  Echo: EF 55-60%, no rwma, mod MR, mildly dil LA/RA, mod  TR, PASP 85mmHg.  Marland Kitchen Coronary artery disease    a. 08/2015 NSTEMI/PCI: LM nl, LAD 75p (med rx), 20 diffuse, 50d, LCX nl, OM1/2 nl, RCA 17m, RPAV 100 (2.25x12 Promus Premier DES) - complicated by 2:1 HB req temp wire post-PCI.  Mar[place] Kitchen Diverticulitis   . Generalized anxiety disorder   . Hepatic steatosis   . HLD (hyperlipidemia)   . Hypertension   . Hypokalemia   . Hypomagnesemia   . Hypothyroidism   . OA (osteoarthritis)   . Osteopenia   . Sleep apnea   . Stroke [facility])    a. 08/2015 Right PCA territory infarct/temoral lobe cerebral infarction-->MRA showed severe flow liminting stenosis of R P2 cerebral artery;  b. 08/2015 Carotid U/S: 1-39% bilat ICA stenosis.  . Subdural hemorrhage ([place])    a. 05/2013 in setting of fall.  . Thrombocytopenia ([place])   . Weakness    Past Surgical History:  Procedure Laterality Date  . CARDIAC CATHETERIZATION N/A 09/08/2015   Procedure: Temporary Pacemaker;  Surgeon: Leonie Man, MD;  Location: [place] CV LAB;  Service: Cardiovascular;  Laterality: N/A;  . CARDIAC CATHETERIZATION N/A 09/08/2015   Procedure: Left Heart Cath and Coronary Angiography;  Surgeon: Leonie Man, MD;  Location: [place] CV LAB;  Service: Cardiovascular;  Laterality: N/A;  . CARDIAC CATHETERIZATION N/A 09/08/2015   Procedure: Coronary Stent Intervention;  Surgeon: Leonie Man, MD;  Location: [place] [facility] LAB;  Service: Cardiovascular;  Laterality: N/A;  . CARDIAC CATHETERIZATION  09/08/2015   Procedure: Central Line Insertion;  Surgeon: Leonie Man, MD;  Location: [place] [facility] LAB;  Service: Cardiovascular;;  . CARDIAC CATHETERIZATION  09/08/2015   Procedure: Arterial Line Insertion;  Surgeon: Leonie Man, MD;  Location: [place] CV LAB;  Service: Cardiovascular;;  . CARDIAC CATHETERIZATION N/A 09/09/2015   Procedure: Temporary Wire;  Surgeon: Lorretta Harp, MD;  Location: [place] [facility];  Service: Cardiovascular;  Laterality: N/A;    Allergies    Allergen Reactions  . Codeine Nausea And Vomiting  . Flomax [Tamsulosin] Other (See Comments)    Pt states that this medication gave her a kidney infection.    Lebron Quam [Hydrocodone-Acetaminophen] Nausea And Vomiting  . Sulfa Antibiotics Nausea And Vomiting    Allergies as of 08/12/2016      Reactions   Codeine Nausea And Vomiting   Flomax [tamsulosin] Other (See Comments)   Pt states that this medication gave her a kidney infection.     Norco [hydrocodone-acetaminophen] Nausea And Vomiting   Sulfa Antibiotics Nausea And Vomiting      Medication List       Accurate as of 08/12/16 11:54 AM. Always use your most recent med list.          acetaminophen 325 MG tablet Commonly known as:  TYLENOL Take 650 mg by mouth every 6 (six) hours as needed for mild pain, fever or headache.   ALPRAZolam 0.25 MG tablet Commonly known as:  XANAX Take 1 tablet (0.25 mg total) by mouth at bedtime as needed for anxiety.   aspirin 81 MG chewable tablet Chew 1 tablet (81 mg total) by mouth daily.   atorvastatin 20 MG tablet Commonly known as:  LIPITOR Take 1 tablet (20 mg total) by mouth daily.   calcium-vitamin D 500-200 MG-UNIT tablet Commonly known as:  OSCAL WITH D Take 1 tablet by mouth daily with breakfast.   carvedilol 3.125 MG tablet Commonly known as:  COREG Take 3.125 mg by mouth 2 (two) times daily with a meal.   clopidogrel 75 MG tablet Commonly known as:  PLAVIX Take 1 tablet (75 mg total) by mouth daily with breakfast.   fluocinonide 0.05 % external solution Commonly known as:  LIDEX   folic acid-pyridoxine-cyancobalamin 2.5-25-2 MG Tabs tablet Commonly known as:  FOLTX Take 1 tablet by mouth daily.   ibuprofen 400 MG tablet Commonly known as:  ADVIL,MOTRIN Take 1 tablet (400 mg total) by mouth every 6 (six) hours as needed for headache or moderate pain.   levothyroxine 25 MCG tablet Commonly known as:  SYNTHROID, LEVOTHROID Take 25 mcg by mouth daily before  breakfast.   lisinopril 40 MG tablet Commonly known as:  PRINIVIL,ZESTRIL Take 40 mg by mouth daily.   magnesium oxide 400 MG tablet Commonly known as:  MAG-OX Take 400 mg by mouth daily.   MULTIVITAMIN GUMMIES ADULT Chew Chew 2 each by mouth at bedtime.   PRESERVISION AREDS 2 Caps Take 1 capsule by mouth daily.   nitroGLYCERIN 0.4 MG SL tablet Commonly known as:  NITROSTAT Place 1 tablet (0.4 mg total) under the tongue every 5 (five) minutes x 3 doses as needed for chest pain.   omeprazole 20 MG capsule Commonly known as:  PRILOSEC Take 20 mg by mouth daily before breakfast.   polyethylene glycol packet Commonly known as:  MIRALAX / GLYCOLAX Take 17 g by mouth daily as needed for mild constipation.   senna-docusate 8.6-50  MG tablet Commonly known as:  Senokot-S Take 1 tablet by mouth as needed.   sodium chloride 0.65 % Soln nasal spray Commonly known as:  OCEAN Place 1 spray into both nostrils as needed for congestion.   traMADol-acetaminophen 37.5-325 MG tablet Commonly known as:  ULTRACET Take 1 tablet by mouth every 4 (four) hours as needed for severe pain.       Review of Systems  Constitutional: Negative for activity change, appetite change, chills, diaphoresis and fever.  HENT: Negative for congestion, sneezing, sore throat, trouble swallowing and voice change.   Respiratory: Negative for apnea, cough, choking, chest tightness, shortness of breath and wheezing.   Cardiovascular: Negative for chest pain, palpitations and leg swelling.  Gastrointestinal: Negative for abdominal distention, abdominal pain, constipation, diarrhea and nausea.  Genitourinary: Negative for difficulty urinating, dysuria, frequency and urgency.  Musculoskeletal: Positive for arthralgias (typical arthritis) and joint swelling. Negative for back pain, gait problem and myalgias.  Skin: Positive for wound. Negative for color change, pallor and rash.  Neurological: Negative for dizziness,  tremors, syncope, speech difficulty, weakness, numbness and headaches.  Psychiatric/Behavioral: Negative for agitation and behavioral problems.  All other systems reviewed and are negative.    There is no immunization history on file for this patient. Pertinent  Health Maintenance Due  Topic Date Due  . PNA vac Low Risk Adult (1 of 2 - PCV13) 02/16/1998  . INFLUENZA VACCINE  10/21/2016  . DEXA SCAN  Completed   No flowsheet data found. Functional Status Survey:    Vitals:   08/12/16 0600  BP: 129/61  Pulse: 82  Resp: 20  Temp: 97.3 F (36.3 C)  SpO2: 95%  Weight: 118 lb 12.8 oz (53.9 kg)   Body mass index is 21.73 kg/m. Physical Exam  Constitutional: She is oriented to person, place, and time. Vital signs are normal. She appears well-developed and well-nourished. She is active and cooperative. She does not appear ill. No distress.  HENT:  Head: Normocephalic and atraumatic.  Mouth/Throat: Uvula is midline, oropharynx is clear and moist and mucous membranes are normal. Mucous membranes are not pale, not dry and not cyanotic.  Eyes: Conjunctivae, EOM and lids are normal. Pupils are equal, round, and reactive to light.  Neck: Trachea normal, normal range of motion and full passive range of motion without pain. Neck supple. No JVD present. No tracheal deviation, no edema and no erythema present. No thyromegaly present.  Cardiovascular: Normal rate, regular rhythm, normal heart sounds, intact distal pulses and normal pulses.  Exam reveals no gallop, no distant heart sounds and no friction rub.   No murmur heard. Pulses:      Radial pulses are 2+ on the right side, and 2+ on the left side.       Dorsalis pedis pulses are 2+ on the right side, and 2+ on the left side.  Pulmonary/Chest: Effort normal and breath sounds normal. No accessory muscle usage. No respiratory distress. She has no decreased breath sounds. She has no wheezes. She has no rhonchi. She has no rales. She exhibits  no tenderness.  Abdominal: Normal appearance and bowel sounds are normal. She exhibits no distension and no ascites. There is no tenderness.  Musculoskeletal: She exhibits no edema or tenderness.       Right elbow: She exhibits decreased range of motion and laceration (incision).  Expected osteoarthritis, stiffness; calves soft, supple. Negative Homan's sign.   Neurological: She is alert and oriented to person, place, and time. She has normal  strength.  Skin: Skin is warm and dry. Laceration (Right Elbow incision) noted. No rash noted. She is not diaphoretic. No cyanosis or erythema. No pallor. Nails show no clubbing.  Psychiatric: She has a normal mood and affect. Her speech is normal and behavior is normal. Judgment and thought content normal. Cognition and memory are normal.  Nursing note and vitals reviewed.   Labs reviewed:  Recent Labs  09/10/15 2145 09/11/15 0440 09/12/15 0337 09/13/15 0342  07/07/16 1953 07/09/16 1036 07/10/16 0605  NA  --  139 140 138  < > 138 134* 140  K  --  3.7 3.5 3.4*  < > 3.3* 3.1* 3.2*  CL  --  109 105 105  < > 102 100* 113*  CO2  --  22 25 25   < > 29 28 24   GLUCOSE  --  102* 132* 115*  < > 143* 140* 106*  BUN  --  28* 29* 17  < > 17 29* 21*  CREATININE  --  0.99 1.06* 0.88  < > 0.85 1.32* 0.79  CALCIUM  --  8.2* 8.6* 8.7*  < > 9.1 9.1 8.0*  MG 2.1 2.2 2.0  --   --   --   --   --   PHOS 2.1* 2.8 2.4* 2.8  --   --   --   --   < > = values in this interval not displayed.  Recent Labs  09/09/15 0350 09/10/15 0357 11/12/15 1454  AST 80* 46* 27  ALT 46 31 20  ALKPHOS 79 71 64  BILITOT 1.0 0.9 0.6  PROT 5.3* 5.0* 7.1  ALBUMIN 2.6* 2.2* 4.2    Recent Labs  02/04/16 1336 05/12/16 1344 07/07/16 1953 07/10/16 0605  WBC 4.9 5.3 9.0 4.1  NEUTROABS 3.2 3.3 8.0*  --   HGB 13.9 14.7 13.9 8.9*  HCT 41.0 43.3 40.3 25.9*  MCV 88.7 89.8 90.4 90.4  PLT 86* 112* 102* 68*   No results found for: TSH Lab Results  Component Value Date   HGBA1C  5.3 09/15/2015   Lab Results  Component Value Date   CHOL 114 09/09/2015   HDL 38 (L) 09/09/2015   LDLCALC 54 09/09/2015   TRIG 111 09/09/2015   CHOLHDL 3.0 09/09/2015    Significant Diagnostic Results in last 30 days:  No results found.  Assessment/Plan 1. Closed fracture of right elbow with routine healing, subsequent encounter  Continue working with PT/OT  Continue exercises as taught by PT/OT  Sling to RUE when OOB  Splint to LUE at all times  Pain control with Tramadol 50 mg po Q 4 hours prn   Ice to the elbow prn for pain, edema  Exercise fingers frequently  Follow up with Orthopedist asap after discharge for continuity of care  2. Anxiety disorder, unspecified type  Continue Alprazolam to 0.25 mg po Q 4 hours prn- anxiety  Follow up with PCP after discharge for continuity of care   Family/ staff Communication:   Total Time:  Documentation:  Face to Face:  Family/Phone:   Labs/tests ordered:    Patient is being discharged with the following home health services: HHPT/OT   Patient is being discharged with the following durable medical equipment: Hemiwalker, quad cane   Patient has been advised to f/u with their PCP in 1-2 weeks to bring them up to date on their rehab stay.  Social services at facility was responsible for arranging this appointment.  Pt was provided with  a 30 day supply of prescriptions for medications and refills must be obtained from their PCP.  For controlled substances, a more limited supply may be provided adequate until PCP appointment only.  Medication list reviewed and assessed for continued appropriateness. Monthly medication orders reviewed and signed.  Vikki Ports, NP-C Geriatrics [facility] [phone], [place] [address] Cell Phone (Mon-Fri 8am-5pm):  [phone] On Call:  [phone] & follow prompts after 5pm & weekends Office Phone:  [phone] Office Fax:   [phone]

## 2016-08-24 ENCOUNTER — Telehealth: Payer: Self-pay | Admitting: Cardiovascular Disease

## 2016-08-24 ENCOUNTER — Other Ambulatory Visit: Payer: Self-pay

## 2016-08-24 DIAGNOSIS — Z79899 Other long term (current) drug therapy: Secondary | ICD-10-CM

## 2016-08-24 DIAGNOSIS — Z5181 Encounter for therapeutic drug level monitoring: Secondary | ICD-10-CM

## 2016-08-24 MED ORDER — CLOPIDOGREL BISULFATE 75 MG PO TABS: 75.0000 mg | ORAL_TABLET | Freq: Every day | ORAL | 1 refills | Status: DC

## 2016-08-24 NOTE — Telephone Encounter (Signed)
No answer. Phone rang several times with no answer.

## 2016-08-24 NOTE — Telephone Encounter (Signed)
BMP order was entered for [facility] if patient able to go before next week.

## 2016-08-24 NOTE — Telephone Encounter (Signed)
Check basic metabolic profile to see if we need to add potassium.

## 2016-08-24 NOTE — Telephone Encounter (Signed)
Pt asks if she should still be taking her Furosemide, if so she needs a refill called to [facility] in [place]

## 2016-08-24 NOTE — Telephone Encounter (Signed)
Returned call to patient. Patient says she is unable to get a ride to the [facility] for labs before her appt on 09/03/16 with Dr Fletcher Anon. Her family is the one who brings her and they live out of town and would have to take off work. I asked if she had a friend or could take other means of transportation. She said I will just wait until I see him next week. I explained the importance of checking potassium level as abnormal potassium could pose cardiac issues.  She verbalized understanding. Denies palpitations, chest pain or dizziness. Advised to try to get lab work this week if possible and she verbalized understanding. Dr Fletcher Anon in office and notified that patient not getting labs until office visit next week. No further at this time.

## 2016-08-24 NOTE — Telephone Encounter (Signed)
After several rings patient picked up. Patient wanted to see if she could get a refill for furosemide. It was discontinued at discharge from [facility] on 07/10/16. However, it was added back at some point to patient during admission and treatment at [facility]. On 07/21/16, at discharge from [facility] to [facility], medication list said to continue furosemide 20 mg daily and stop potassium. Last BMP on 07/21/16  in [facility], potassium was 4.3.  On 07/10/16, BMP at [facility] showed potassium 3.2. Patient has enough potassium to get to her appt next week with Dr Fletcher Anon on 09/03/16. She also needs refill for clopidogrel. Refill sent in for clopidogrel will wait on refill for furosemide until confirmed with Dr Fletcher Anon. Advised will route to Dr Fletcher Anon for review.

## 2016-09-03 ENCOUNTER — Ambulatory Visit (INDEPENDENT_AMBULATORY_CARE_PROVIDER_SITE_OTHER): Payer: Medicare Other | Admitting: Cardiovascular Disease

## 2016-09-03 ENCOUNTER — Encounter: Payer: Self-pay | Admitting: Cardiovascular Disease

## 2016-09-03 VITALS — BP 100/60 | HR 91 | Ht 62.0 in | Wt 116.5 lb

## 2016-09-03 DIAGNOSIS — E785 Hyperlipidemia, unspecified: Secondary | ICD-10-CM

## 2016-09-03 DIAGNOSIS — I5022 Chronic systolic (congestive) heart failure: Secondary | ICD-10-CM

## 2016-09-03 DIAGNOSIS — I10 Essential (primary) hypertension: Secondary | ICD-10-CM | POA: Diagnosis not present

## 2016-09-03 DIAGNOSIS — I25118 Atherosclerotic heart disease of native coronary artery with other forms of angina pectoris: Secondary | ICD-10-CM | POA: Diagnosis not present

## 2016-09-03 NOTE — Patient Instructions (Signed)
Medication Instructions:  Your physician has recommended you make the following change in your medication:  STOP taking plavix   Labwork: BMET today  Testing/Procedures: none  Follow-Up: Your physician recommends that you schedule a follow-up appointment in: 3 months with Dr. Fletcher Anon.    Any Other Special Instructions Will Be Listed Below (If Applicable).     If you need a refill on your cardiac medications before your next appointment, please call your pharmacy.

## 2016-09-03 NOTE — Progress Notes (Signed)
Cardiology Office Note   Date:  09/03/2016   ID:  Teresa Franklin, DOB 04/11/1930, MRN [id number]  PCP:  [name], MD  Cardiologist:   [name], MD   Chief Complaint  Patient presents with  . other    3 month follow up. Meds reviewed by the pt's med list. The pt. took a fall and had a recent elbow surgery. Pt. c/o weak spells but denies chest pain or shortness of breath.       History of Present Illness: Teresa Franklin is a 83 y.o. female who presents for a follow-up visit regarding coronary artery disease. She presented in June 2017 with non-ST elevation myocardial infarction which was complicated by hypotension and high-grade AV block. Cardiac catheterization  showed an occluded right posterior AV groove artery which was treated successfully with PCI and drug-eluting stent placement. She did not require a permanent pacemaker. Ejection fraction was normal. There was significant disease affecting the proximal and mid LAD. Medical therapy was recommended considering her frail status and involvement of a large diagonal branch at the site of stenosis. She also suffered from what seems to be a stroke on June 25 which was confirmed with an MRI. However, she had no residual neurologic deficits.  She had worsening exertional dyspnea this year. She underwent a pharmacologic nuclear stress test which showed no evidence of ischemia. There was evidence of prior infarct in the lateral wall with an ejection fraction of 34%. This was followed by an echocardiogram which showed an ejection fraction of 30-35% with severe hypokinesis of the anterolateral and inferior wall, moderate mitral regurgitation and no evidence of pulmonary hypertension.  She was hospitalized in April after a fall which resulted in right elbow fracture. She was treated conservatively. Furosemide was discontinued during that visit due to poor oral intake. She underwent surgical repair of her right elbow at [facility]. She  has been doing reasonably well with no chest pain or dyspnea. She continues to complain of fatigue. She is getting physical therapy at home.    Past Medical History:  Diagnosis Date  . Anxiety   . AV block    a. 08/2015 in setting of NSTEMI and RPAV intervention-->required temp wire but not PPM.  . Back pain   . Chronic diastolic CHF (congestive heart failure) ([place])    a. 08/2015  Echo: EF 55-60%, no rwma, mod MR, mildly dil LA/RA, mod TR, PASP 42mmHg.  Marland Kitchen Coronary artery disease    a. 08/2015 NSTEMI/PCI: LM nl, LAD 75p (med rx), 20 diffuse, 50d, LCX nl, OM1/2 nl, RCA 94m, RPAV 100 (2.25x12 Promus Premier DES) - complicated by 2:1 HB req temp wire post-PCI.  Marland Kitchen Diverticulitis   . Generalized anxiety disorder   . Hepatic steatosis   . HLD (hyperlipidemia)   . Hypertension   . Hypokalemia   . Hypomagnesemia   . Hypothyroidism   . OA (osteoarthritis)   . Osteopenia   . Sleep apnea   . Stroke [facility])    a. 08/2015 Right PCA territory infarct/temoral lobe cerebral infarction-->MRA showed severe flow liminting stenosis of R P2 cerebral artery;  b. 08/2015 Carotid U/S: 1-39% bilat ICA stenosis.  . Subdural hemorrhage ([place])    a. 05/2013 in setting of fall.  . Thrombocytopenia ([place])   . Weakness     Past Surgical History:  Procedure Laterality Date  . CARDIAC CATHETERIZATION N/A 09/08/2015   Procedure: Temporary Pacemaker;  Surgeon: Leonie Man, MD;  Location: [place]  CV LAB;  Service: Cardiovascular;  Laterality: N/A;  . CARDIAC CATHETERIZATION N/A 09/08/2015   Procedure: Left Heart Cath and Coronary Angiography;  Surgeon: Leonie Man, MD;  Location: [place] CV LAB;  Service: Cardiovascular;  Laterality: N/A;  . CARDIAC CATHETERIZATION N/A 09/08/2015   Procedure: Coronary Stent Intervention;  Surgeon: Leonie Man, MD;  Location: [place] CV LAB;  Service: Cardiovascular;  Laterality: N/A;  . CARDIAC CATHETERIZATION  09/08/2015   Procedure: Central Line Insertion;  Surgeon:  Leonie Man, MD;  Location: [place] CV LAB;  Service: Cardiovascular;;  . CARDIAC CATHETERIZATION  09/08/2015   Procedure: Arterial Line Insertion;  Surgeon: Leonie Man, MD;  Location: [place] CV LAB;  Service: Cardiovascular;;  . CARDIAC CATHETERIZATION N/A 09/09/2015   Procedure: Temporary Wire;  Surgeon: Lorretta Harp, MD;  Location: [place] CV LAB;  Service: Cardiovascular;  Laterality: N/A;  . ELBOW SURGERY       Current Outpatient Prescriptions  Medication Sig Dispense Refill  . acetaminophen (TYLENOL) 325 MG tablet Take 650 mg by mouth every 6 (six) hours as needed for mild pain, fever or headache.     . ALPRAZolam (XANAX) 0.25 MG tablet Take 1 tablet (0.25 mg total) by mouth at bedtime as needed for anxiety. 15 tablet 0  . aspirin 81 MG chewable tablet Chew 1 tablet (81 mg total) by mouth daily.    Marland Kitchen atorvastatin (LIPITOR) 20 MG tablet Take 1 tablet (20 mg total) by mouth daily. 30 tablet 5  . calcium-vitamin D (OSCAL WITH D) 500-200 MG-UNIT tablet Take 1 tablet by mouth daily with breakfast.     . carvedilol (COREG) 3.125 MG tablet Take 3.125 mg by mouth 2 (two) times daily with a meal.     . cetirizine (ZYRTEC) 10 MG tablet Take 10 mg by mouth daily.    . fluocinonide (LIDEX) 0.05 % external solution     . folic acid-pyridoxine-cyancobalamin (FOLTX) 2.5-25-2 MG TABS tablet Take 1 tablet by mouth daily.     . furosemide (LASIX) 20 MG tablet Take 20 mg by mouth daily.    Marland Kitchen levothyroxine (SYNTHROID, LEVOTHROID) 25 MCG tablet Take 25 mcg by mouth daily before breakfast.     . lisinopril (PRINIVIL,ZESTRIL) 40 MG tablet Take 40 mg by mouth daily.    . magnesium oxide (MAG-OX) 400 MG tablet Take 400 mg by mouth daily.    . meloxicam (MOBIC) 7.5 MG tablet Take 7.5 mg by mouth daily.    . Multiple Vitamins-Minerals (MULTIVITAMIN GUMMIES ADULT) CHEW Chew 2 each by mouth at bedtime.    . Multiple Vitamins-Minerals (PRESERVISION AREDS 2) CAPS Take 1 capsule by mouth  daily.    . nitroGLYCERIN (NITROSTAT) 0.4 MG SL tablet Place 1 tablet (0.4 mg total) under the tongue every 5 (five) minutes x 3 doses as needed for chest pain. 25 tablet 2  . omeprazole (PRILOSEC) 20 MG capsule Take 20 mg by mouth daily before breakfast.     . polyethylene glycol (MIRALAX / GLYCOLAX) packet Take 17 g by mouth daily as needed for mild constipation. 14 each 0  . senna-docusate (SENOKOT-S) 8.6-50 MG tablet Take 1 tablet by mouth as needed.     . Simethicone 80 MG TABS Take 80 mg by mouth every 6 (six) hours as needed.    . sodium chloride (OCEAN) 0.65 % SOLN nasal spray Place 1 spray into both nostrils as needed for congestion. 1 Bottle 0   No current facility-administered medications for  this visit.     Allergies:   Codeine; Flomax [tamsulosin]; Norco [hydrocodone-acetaminophen]; and Sulfa antibiotics    Social History:  The patient  reports that she has never smoked. She has never used smokeless tobacco. She reports that she does not drink alcohol or use drugs.   Family History:  The patient's family history includes Bipolar disorder in her sister; Diabetes in her sister; Hyperlipidemia in her mother, sister, and sister; Hypertension in her mother and sister; Prostate cancer in her brother; Stroke in her mother and sister.    ROS:  Please see the history of present illness.   Otherwise, review of systems are positive for none.   All other systems are reviewed and negative.    PHYSICAL EXAM: VS:  BP 100/60 (BP Location: Left Arm, Patient Position: Sitting, Cuff Size: Normal)   Pulse 91   Ht 5\' 2"  (1.575 m)   Wt 116 lb 8 oz (52.8 kg)   BMI 21.31 kg/m  , BMI Body mass index is 21.31 kg/m. GEN: Well nourished, well developed, in no acute distress  HEENT: normal  Neck: no JVD, carotid bruits, or masses Cardiac: RRR; 1/6 systolic ejection murmur in the aortic area, no rubs, or gallops,no edema  Respiratory:  clear to auscultation bilaterally, normal work of breathing GI:  soft, nontender, nondistended, + BS MS: no deformity or atrophy  Skin: warm and dry, no rash Neuro:  Strength and sensation are intact Psych: euthymic mood, full affect   EKG:  EKG is ordered today. The ekg ordered today demonstrates normal sinus rhythm with nonspecific T wave changes   Recent Labs: 09/12/2015: Magnesium 2.0 09/16/2015: B Natriuretic Peptide 1,214.1 11/12/2015: ALT 20 07/10/2016: BUN 21; Creatinine, Ser 0.79; Hemoglobin 8.9; Platelets 68; Potassium 3.2; Sodium 140    Lipid Panel    Component Value Date/Time   CHOL 114 09/09/2015 0350   TRIG 111 09/09/2015 0350   HDL 38 (L) 09/09/2015 0350   CHOLHDL 3.0 09/09/2015 0350   VLDL 22 09/09/2015 0350   LDLCALC 54 09/09/2015 0350      Wt Readings from Last 3 Encounters:  09/03/16 116 lb 8 oz (52.8 kg)  08/12/16 118 lb 12.8 oz (53.9 kg)  08/05/16 117 lb 4.8 oz (53.2 kg)       ASSESSMENT AND PLAN:  1.  Coronary artery disease involving native coronary arteries with other forms of angina:  She has been stable overall with no worsening symptoms. It has been one year since her myocardial infarction and stent placement. Thus, I discontinued clopidogrel. Given age, frail status and comorbidities, and he invasive cardiac evaluation should be left as a last resort.  2. Chronic systolic  heart failure:   She appears to be euvolemic on current dose of furosemide 20 mg daily. Continue treatment with carvedilol and lisinopril. I requested basic metabolic profile. Blood pressure in the low side. We might need to consider decreasing lisinopril.  3. Hyperlipidemia: Continue treatment with atorvastatin. LDL was 54 in June..   Disposition:   FU with me in 3 months  Signed,  [name], MD  09/03/2016 10:32 AM    [place]

## 2016-09-04 LAB — BASIC METABOLIC PANEL
BUN/Creatinine Ratio: 12 (ref 12–28)
BUN: 9 mg/dL (ref 8–27)
CO2: 22 mmol/L (ref 20–29)
CREATININE: 0.75 mg/dL (ref 0.57–1.00)
Calcium: 10.3 mg/dL (ref 8.7–10.3)
Chloride: 102 mmol/L (ref 96–106)
GFR calc Af Amer: 85 mL/min/{1.73_m2} (ref >59)
GFR calc non Af Amer: 74 mL/min/{1.73_m2} (ref >59)
GLUCOSE: 124 mg/dL — AB (ref 65–99)
POTASSIUM: 3.6 mmol/L (ref 3.5–5.2)
SODIUM: 144 mmol/L (ref 134–144)

## 2016-10-22 ENCOUNTER — Other Ambulatory Visit: Payer: Self-pay | Admitting: Family Medicine

## 2016-10-22 DIAGNOSIS — Z1231 Encounter for screening mammogram for malignant neoplasm of breast: Secondary | ICD-10-CM

## 2016-10-23 DIAGNOSIS — R739 Hyperglycemia, unspecified: Secondary | ICD-10-CM | POA: Insufficient documentation

## 2016-11-30 ENCOUNTER — Ambulatory Visit
Admission: RE | Admit: 2016-11-30 | Discharge: 2016-11-30 | Disposition: A | Discharge status: Home / Self Care | Payer: Medicare Other | Source: Ambulatory Visit | Attending: Family Medicine | Admitting: Family Medicine

## 2016-11-30 DIAGNOSIS — Z1231 Encounter for screening mammogram for malignant neoplasm of breast: Secondary | ICD-10-CM | POA: Insufficient documentation

## 2016-12-11 ENCOUNTER — Encounter: Payer: Self-pay | Admitting: Cardiovascular Disease

## 2016-12-11 ENCOUNTER — Ambulatory Visit (INDEPENDENT_AMBULATORY_CARE_PROVIDER_SITE_OTHER): Payer: Medicare Other | Admitting: Cardiovascular Disease

## 2016-12-11 VITALS — BP 140/74 | HR 73 | Ht 62.5 in | Wt 110.5 lb

## 2016-12-11 DIAGNOSIS — E785 Hyperlipidemia, unspecified: Secondary | ICD-10-CM | POA: Diagnosis not present

## 2016-12-11 DIAGNOSIS — I10 Essential (primary) hypertension: Secondary | ICD-10-CM

## 2016-12-11 DIAGNOSIS — I5022 Chronic systolic (congestive) heart failure: Secondary | ICD-10-CM | POA: Diagnosis not present

## 2016-12-11 DIAGNOSIS — I25118 Atherosclerotic heart disease of native coronary artery with other forms of angina pectoris: Secondary | ICD-10-CM | POA: Diagnosis not present

## 2016-12-11 MED ORDER — ISOSORBIDE MONONITRATE ER 30 MG PO TB24: 30.0000 mg | ORAL_TABLET | Freq: Every day | ORAL | 1 refills | Status: DC

## 2016-12-11 MED ORDER — ISOSORBIDE MONONITRATE ER 30 MG PO TB24: 30.0000 mg | ORAL_TABLET | Freq: Every day | ORAL | 3 refills | Status: DC

## 2016-12-11 NOTE — Patient Instructions (Signed)
Medication Instructions:  Your physician has recommended you make the following change in your medication:  START taking imdur 30mg  once daily   Labwork: none  Testing/Procedures: none  Follow-Up: Your physician wants you to follow-up in: 4 months with Dr. Fletcher Anon.  You will receive a reminder letter in the mail two months in advance. If you don't receive a letter, please call our office to schedule the follow-up appointment.   Any Other Special Instructions Will Be Listed Below (If Applicable).     If you need a refill on your cardiac medications before your next appointment, please call your pharmacy.

## 2016-12-11 NOTE — Progress Notes (Signed)
Cardiology Office Note   Date:  12/11/2016   ID:  SANNA PORCARO, DOB [birth year], MRN [id number]  PCP:  [name], MD  Cardiologist:   [name], MD   Chief Complaint  Patient presents with  . other    3 month follow up. Meds reviewed by the pt. verbally. Pt. c/o depression she is being treated with Zoloft, she has weight loss and no appetitie, has shortness of breath, nausea and feeling exhausted with little to no exertion.       History of Present Illness: Teresa Franklin is a 83 y.o. female who presents for a follow-up visit regarding coronary artery disease. She presented in June 2017 with non-ST elevation myocardial infarction which was complicated by hypotension and high-grade AV block. Cardiac catheterization  showed an occluded right posterior AV groove artery which was treated successfully with PCI and drug-eluting stent placement. She did not require a permanent pacemaker. Ejection fraction was normal. There was significant disease affecting the proximal and mid LAD. Medical therapy was recommended considering her frail status and involvement of a large diagonal branch at the site of stenosis. She also suffered from what seems to be a stroke on June 25 which was confirmed with an MRI. However, she had no residual neurologic deficits.  She had worsening exertional dyspnea this year. She underwent a pharmacologic nuclear stress test which showed no evidence of ischemia. There was evidence of prior infarct in the lateral wall with an ejection fraction of 34%. This was followed by an echocardiogram which showed an ejection fraction of 30-35% with severe hypokinesis of the inferolateral and inferior wall, moderate mitral regurgitation and no evidence of pulmonary hypertension.  She had right elbow fracture this year that required surgery. She had increased anxiety recently after hospitalization of her husband and was started on Zoloft by her primary care physician with subsequent  improvement. She has been going to the swimming pool for exercise with improved stamina overall. However, she continues to complain of exertional nausea and shortness of breath without chest pain. Nausea was the main symptom during her myocardial infarction. She also has been having poor appetite and has lost some weight.    Past Medical History:  Diagnosis Date  . Anxiety   . AV block    a. 08/2015 in setting of NSTEMI and RPAV intervention-->required temp wire but not PPM.  . Back pain   . Chronic diastolic CHF (congestive heart failure) ([place])    a. 08/2015  Echo: EF 55-60%, no rwma, mod MR, mildly dil LA/RA, mod TR, PASP 88mmHg.  Marland Kitchen Coronary artery disease    a. 08/2015 NSTEMI/PCI: LM nl, LAD 75p (med rx), 20 diffuse, 50d, LCX nl, OM1/2 nl, RCA 63m, RPAV 100 (2.25x12 Promus Premier DES) - complicated by 2:1 HB req temp wire post-PCI.  Marland Kitchen Diverticulitis   . Generalized anxiety disorder   . Hepatic steatosis   . HLD (hyperlipidemia)   . Hypertension   . Hypokalemia   . Hypomagnesemia   . Hypothyroidism   . OA (osteoarthritis)   . Osteopenia   . Sleep apnea   . Stroke [facility])    a. 08/2015 Right PCA territory infarct/temoral lobe cerebral infarction-->MRA showed severe flow liminting stenosis of R P2 cerebral artery;  b. 08/2015 Carotid U/S: 1-39% bilat ICA stenosis.  . Subdural hemorrhage ([place])    a. 05/2013 in setting of fall.  . Thrombocytopenia ([place])   . Weakness     Past Surgical History:  Procedure  Laterality Date  . CARDIAC CATHETERIZATION N/A 09/08/2015   Procedure: Temporary Pacemaker;  Surgeon: Leonie Man, MD;  Location: [place] CV LAB;  Service: Cardiovascular;  Laterality: N/A;  . CARDIAC CATHETERIZATION N/A 09/08/2015   Procedure: Left Heart Cath and Coronary Angiography;  Surgeon: Leonie Man, MD;  Location: [place] CV LAB;  Service: Cardiovascular;  Laterality: N/A;  . CARDIAC CATHETERIZATION N/A 09/08/2015   Procedure: Coronary Stent Intervention;  Surgeon:  Leonie Man, MD;  Location: [place] CV LAB;  Service: Cardiovascular;  Laterality: N/A;  . CARDIAC CATHETERIZATION  09/08/2015   Procedure: Central Line Insertion;  Surgeon: Leonie Man, MD;  Location: [place] CV LAB;  Service: Cardiovascular;;  . CARDIAC CATHETERIZATION  09/08/2015   Procedure: Arterial Line Insertion;  Surgeon: Leonie Man, MD;  Location: [place] CV LAB;  Service: Cardiovascular;;  . CARDIAC CATHETERIZATION N/A 09/09/2015   Procedure: Temporary Wire;  Surgeon: Lorretta Harp, MD;  Location: [place] CV LAB;  Service: Cardiovascular;  Laterality: N/A;  . ELBOW SURGERY       Current Outpatient Prescriptions  Medication Sig Dispense Refill  . acetaminophen (TYLENOL) 325 MG tablet Take 650 mg by mouth every 6 (six) hours as needed for mild pain, fever or headache.     . ALPRAZolam (XANAX) 0.25 MG tablet Take 1 tablet (0.25 mg total) by mouth at bedtime as needed for anxiety. 15 tablet 0  . aspirin 81 MG chewable tablet Chew 1 tablet (81 mg total) by mouth daily.    Marland Kitchen atorvastatin (LIPITOR) 20 MG tablet Take 1 tablet (20 mg total) by mouth daily. 30 tablet 5  . calcium-vitamin D (OSCAL WITH D) 500-200 MG-UNIT tablet Take 1 tablet by mouth daily with breakfast.     . carvedilol (COREG) 3.125 MG tablet Take 3.125 mg by mouth 2 (two) times daily with a meal.     . cetirizine (ZYRTEC) 10 MG tablet Take 10 mg by mouth daily.    . fluocinonide (LIDEX) 0.05 % external solution     . folic acid-pyridoxine-cyancobalamin (FOLTX) 2.5-25-2 MG TABS tablet Take 1 tablet by mouth daily.     . furosemide (LASIX) 20 MG tablet Take 20 mg by mouth daily.    Marland Kitchen levothyroxine (SYNTHROID, LEVOTHROID) 25 MCG tablet Take 25 mcg by mouth daily before breakfast.     . lisinopril (PRINIVIL,ZESTRIL) 40 MG tablet Take 40 mg by mouth daily.    . magnesium oxide (MAG-OX) 400 MG tablet Take 400 mg by mouth daily.    . meloxicam (MOBIC) 7.5 MG tablet Take 7.5 mg by mouth daily.    .  Multiple Vitamins-Minerals (MULTIVITAMIN GUMMIES ADULT) CHEW Chew 2 each by mouth at bedtime.    . Multiple Vitamins-Minerals (PRESERVISION AREDS 2) CAPS Take 1 capsule by mouth daily.    . nitroGLYCERIN (NITROSTAT) 0.4 MG SL tablet Place 1 tablet (0.4 mg total) under the tongue every 5 (five) minutes x 3 doses as needed for chest pain. 25 tablet 2  . omeprazole (PRILOSEC) 20 MG capsule Take 20 mg by mouth daily before breakfast.     . polyethylene glycol (MIRALAX / GLYCOLAX) packet Take 17 g by mouth daily as needed for mild constipation. 14 each 0  . senna-docusate (SENOKOT-S) 8.6-50 MG tablet Take 1 tablet by mouth as needed.     . sertraline (ZOLOFT) 25 MG tablet Take a half tablet daily x 1 week, then 1 po daily    . Simethicone 80 MG  TABS Take 80 mg by mouth every 6 (six) hours as needed.    . sodium chloride (OCEAN) 0.65 % SOLN nasal spray Place 1 spray into both nostrils as needed for congestion. 1 Bottle 0   No current facility-administered medications for this visit.     Allergies:   Codeine; Flomax [tamsulosin]; Norco [hydrocodone-acetaminophen]; and Sulfa antibiotics    Social History:  The patient  reports that she has never smoked. She has never used smokeless tobacco. She reports that she does not drink alcohol or use drugs.   Family History:  The patient's family history includes Bipolar disorder in her sister; Diabetes in her sister; Hyperlipidemia in her mother, sister, and sister; Hypertension in her mother and sister; Prostate cancer in her brother; Stroke in her mother and sister.    ROS:  Please see the history of present illness.   Otherwise, review of systems are positive for none.   All other systems are reviewed and negative.    PHYSICAL EXAM: VS:  BP 140/74 (BP Location: Left Arm, Patient Position: Sitting, Cuff Size: Normal)   Pulse 73   Ht 5' 2.5" (1.588 m)   Wt 110 lb 8 oz (50.1 kg)   BMI 19.89 kg/m  , BMI Body mass index is 19.89 kg/m. GEN: Well  nourished, well developed, in no acute distress  HEENT: normal  Neck: no JVD, carotid bruits, or masses Cardiac: RRR; 1/6 systolic ejection murmur in the aortic area, no rubs, or gallops,no edema  Respiratory:  clear to auscultation bilaterally, normal work of breathing GI: soft, nontender, nondistended, + BS MS: no deformity or atrophy  Skin: warm and dry, no rash Neuro:  Strength and sensation are intact Psych: euthymic mood, full affect   EKG:  EKG is ordered today. The ekg ordered today demonstrates normal sinus rhythm with possible left atrial enlargement and nonspecific ST and T wave changes.    Recent Labs: 07/10/2016: Hemoglobin 8.9; Platelets 68 09/03/2016: BUN 9; Creatinine, Ser 0.75; Potassium 3.6; Sodium 144    Lipid Panel    Component Value Date/Time   CHOL 114 09/09/2015 0350   TRIG 111 09/09/2015 0350   HDL 38 (L) 09/09/2015 0350   CHOLHDL 3.0 09/09/2015 0350   VLDL 22 09/09/2015 0350   LDLCALC 54 09/09/2015 0350      Wt Readings from Last 3 Encounters:  12/11/16 110 lb 8 oz (50.1 kg)  09/03/16 116 lb 8 oz (52.8 kg)  08/12/16 118 lb 12.8 oz (53.9 kg)       ASSESSMENT AND PLAN:  1.  Coronary artery disease involving native coronary arteries with other forms of angina:  The patient's exertional dyspnea and nausea are likely angina equivalent. I elected to add Imdur 30 mg once daily. Continue other medications.   2. Chronic systolic  heart failure:   She appears to be euvolemic on current dose of furosemide 20 mg daily. Continue treatment with carvedilol and lisinopril.  3. Hyperlipidemia: Continue treatment with atorvastatin. LDL was 54 in June..   4. Anxiety: Improved with Zoloft.   5. Weight loss: Seems to be due to poor appetite. No symptoms of postprandial angina.   Disposition:   FU with me in 4 months  Signed,  [name], MD  12/11/2016 10:23 AM    [place]

## 2017-05-11 ENCOUNTER — Encounter: Payer: Self-pay | Admitting: Internal Medicine

## 2017-05-11 ENCOUNTER — Inpatient Hospital Stay: Payer: Medicare Other | Attending: Internal Medicine | Admitting: Internal Medicine

## 2017-05-11 VITALS — BP 157/84 | HR 77 | Temp 97.9°F | Resp 16 | Wt 113.0 lb

## 2017-05-11 DIAGNOSIS — D696 Thrombocytopenia, unspecified: Secondary | ICD-10-CM

## 2017-05-11 DIAGNOSIS — I5032 Chronic diastolic (congestive) heart failure: Secondary | ICD-10-CM | POA: Insufficient documentation

## 2017-05-11 DIAGNOSIS — I252 Old myocardial infarction: Secondary | ICD-10-CM | POA: Insufficient documentation

## 2017-05-11 DIAGNOSIS — I2510 Atherosclerotic heart disease of native coronary artery without angina pectoris: Secondary | ICD-10-CM

## 2017-05-11 DIAGNOSIS — R296 Repeated falls: Secondary | ICD-10-CM

## 2017-05-11 NOTE — Assessment & Plan Note (Addendum)
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  Most recent platelets 97 PCP office; hemoglobin white count normal.   # Review of patient's old CBC-shows patient had intermittent low platelets; near to being 99 in 2014.  Discussed multiple treatment options for ITP including-steroids; IVIG; thrombopoietin stimulating agent pills; also last splenectomy.  # CAD status post stenting on aspirin.  Patient is currently not on Plavix.  #Debility/falls-recommend patient use assistive devices to avoid fall.  # Recommend follow-up with me in 4 months with labs (CBC, BMP).   CC:Dr.Grandis.

## 2017-05-11 NOTE — Progress Notes (Signed)
[facility] NOTE  Patient Care Team: [name], MD as PCP - General (Family Medicine)  CHIEF COMPLAINTS/PURPOSE OF CONSULTATION:   # CHRONIC THROMBOCYTOPENIA ~ 90-100 [since 2014]  # July 2017 CAD s/p Stenting & Stroke [Hx of cardiogenic shock]   # ? GIB [colonoscopy- divertciulosis [facility] 2012]   No history exists.     HISTORY OF PRESENTING ILLNESS:  Teresa Franklin 84 y.o.  female chronic thrombocytopenia currently under surveillance has been referred to [place] for further evaluation-given her recent platelet count was 97.  Patient had multiple falls in the last many months-of unclear etiology.  Patient has been taken off her Plavix.  Patient admits to mild bruising otherwise denies any spontaneous bleeding from gums or epistaxis..  Patient denies any alcohol. Does not complain of any active GI bleed.  ROS: A complete 10 point review of system is done which is negative except mentioned above in history of present illness  MEDICAL HISTORY:  Past Medical History:  Diagnosis Date  . Anxiety   . AV block    a. 08/2015 in setting of NSTEMI and RPAV intervention-->required temp wire but not PPM.  . Back pain   . Chronic diastolic CHF (congestive heart failure) ([place])    a. 08/2015  Echo: EF 55-60%, no rwma, mod MR, mildly dil LA/RA, mod TR, PASP 36mmHg.  Marland Kitchen Coronary artery disease    a. 08/2015 NSTEMI/PCI: LM nl, LAD 75p (med rx), 20 diffuse, 50d, LCX nl, OM1/2 nl, RCA 50m, RPAV 100 (2.25x12 Promus Premier DES) - complicated by 2:1 HB req temp wire post-PCI.  Marland Kitchen Diverticulitis   . Generalized anxiety disorder   . Hepatic steatosis   . HLD (hyperlipidemia)   . Hypertension   . Hypokalemia   . Hypomagnesemia   . Hypothyroidism   . OA (osteoarthritis)   . Osteopenia   . Sleep apnea   . Stroke [facility])    a. 08/2015 Right PCA territory infarct/temoral lobe cerebral infarction-->MRA showed severe flow liminting stenosis of R P2 cerebral artery;  b. 08/2015 Carotid  U/S: 1-39% bilat ICA stenosis.  . Subdural hemorrhage ([place])    a. 05/2013 in setting of fall.  . Thrombocytopenia ([place])   . Weakness     SURGICAL HISTORY: Past Surgical History:  Procedure Laterality Date  . CARDIAC CATHETERIZATION N/A 09/08/2015   Procedure: Temporary Pacemaker;  Surgeon: Leonie Man, MD;  Location: [place] [facility];  Service: Cardiovascular;  Laterality: N/A;  . CARDIAC CATHETERIZATION N/A 09/08/2015   Procedure: Left Heart Cath and Coronary Angiography;  Surgeon: Leonie Man, MD;  Location: [place] [facility];  Service: Cardiovascular;  Laterality: N/A;  . CARDIAC CATHETERIZATION N/A 09/08/2015   Procedure: Coronary Stent Intervention;  Surgeon: Leonie Man, MD;  Location: [place] [facility];  Service: Cardiovascular;  Laterality: N/A;  . CARDIAC CATHETERIZATION  09/08/2015   Procedure: Central Line Insertion;  Surgeon: Leonie Man, MD;  Location: [place] [facility];  Service: Cardiovascular;;  . CARDIAC CATHETERIZATION  09/08/2015   Procedure: Arterial Line Insertion;  Surgeon: Leonie Man, MD;  Location: [place] [facility];  Service: Cardiovascular;;  . CARDIAC CATHETERIZATION N/A 09/09/2015   Procedure: Temporary Wire;  Surgeon: Lorretta Harp, MD;  Location: [place] [facility];  Service: Cardiovascular;  Laterality: N/A;  . ELBOW SURGERY      SOCIAL HISTORY: worked until until Tax adviser- retd; no smoking/alcohol/ lives with husband in [place].  Social History  Socioeconomic History  . Marital status: Married    Spouse name: Not on file  . Number of children: Not on file  . Years of education: Not on file  . Highest education level: Not on file  Social Needs  . Financial resource strain: Not on file  . Food insecurity - worry: Not on file  . Food insecurity - inability: Not on file  . Transportation needs - medical: Not on file  . Transportation needs - non-medical: Not on file  Occupational History  . Not on file  Tobacco Use  .  Smoking status: Never Smoker  . Smokeless tobacco: Never Used  Substance and Sexual Activity  . Alcohol use: No    Alcohol/week: 0.0 oz  . Drug use: No  . Sexual activity: No  Other Topics Concern  . Not on file  Social History Narrative  . Not on file    FAMILY HISTORY: Family History  Problem Relation Age of Onset  . Hypertension Mother   . Hyperlipidemia Mother   . Stroke Mother   . Diabetes Sister   . Hypertension Sister   . Hyperlipidemia Sister   . Bipolar disorder Sister   . Hyperlipidemia Sister   . Stroke Sister   . Prostate cancer Brother   . Breast cancer Neg Hx     ALLERGIES:  is allergic to codeine; flomax [tamsulosin]; norco [hydrocodone-acetaminophen]; and sulfa antibiotics.  MEDICATIONS:  Current Outpatient Medications  Medication Sig Dispense Refill  . acetaminophen (TYLENOL) 325 MG tablet Take 650 mg by mouth every 6 (six) hours as needed for mild pain, fever or headache.     . ALPRAZolam (XANAX) 0.25 MG tablet Take 1 tablet (0.25 mg total) by mouth at bedtime as needed for anxiety. 15 tablet 0  . aspirin 81 MG chewable tablet Chew 1 tablet (81 mg total) by mouth daily.    Marland Kitchen atorvastatin (LIPITOR) 20 MG tablet Take 1 tablet (20 mg total) by mouth daily. 30 tablet 5  . calcium-vitamin D (OSCAL WITH D) 500-200 MG-UNIT tablet Take 1 tablet by mouth daily with breakfast.     . carvedilol (COREG) 3.125 MG tablet Take 3.125 mg by mouth 2 (two) times daily with a meal.     . cetirizine (ZYRTEC) 10 MG tablet Take 10 mg by mouth daily.    . fluocinonide (LIDEX) 0.05 % external solution     . furosemide (LASIX) 20 MG tablet Take 20 mg by mouth daily.    . isosorbide mononitrate (IMDUR) 30 MG 24 hr tablet Take 1 tablet (30 mg total) by mouth daily. 90 tablet 3  . levothyroxine (SYNTHROID, LEVOTHROID) 25 MCG tablet Take 25 mcg by mouth daily before breakfast.     . lisinopril (PRINIVIL,ZESTRIL) 40 MG tablet Take 40 mg by mouth daily.    . magnesium oxide (MAG-OX)  400 MG tablet Take 400 mg by mouth daily.    . meloxicam (MOBIC) 7.5 MG tablet Take 7.5 mg by mouth daily.    . Multiple Vitamins-Minerals (MULTIVITAMIN GUMMIES ADULT) CHEW Chew 2 each by mouth at bedtime.    . Multiple Vitamins-Minerals (PRESERVISION AREDS 2) CAPS Take 1 capsule by mouth daily.    . nitroGLYCERIN (NITROSTAT) 0.4 MG SL tablet Place 1 tablet (0.4 mg total) under the tongue every 5 (five) minutes x 3 doses as needed for chest pain. 25 tablet 2  . omeprazole (PRILOSEC) 20 MG capsule Take 20 mg by mouth daily before breakfast.     . polyethylene glycol (  MIRALAX / GLYCOLAX) packet Take 17 g by mouth daily as needed for mild constipation. 14 each 0  . senna-docusate (SENOKOT-S) 8.6-50 MG tablet Take 1 tablet by mouth as needed.     . sertraline (ZOLOFT) 25 MG tablet Take a half tablet daily x 1 week, then 1 po daily    . Simethicone 80 MG TABS Take 80 mg by mouth every 6 (six) hours as needed.    . sodium chloride (OCEAN) 0.65 % SOLN nasal spray Place 1 spray into both nostrils as needed for congestion. 1 Bottle 0   No current facility-administered medications for this visit.       Marland Kitchen  PHYSICAL EXAMINATION:   Vitals:   05/11/17 1455  BP: (!) 157/84  Pulse: 77  Resp: 16  Temp: 97.9 F (36.6 C)   Filed Weights   05/11/17 1455  Weight: 113 lb (51.3 kg)    GENERAL: Well-nourished well-developed; Alert, no distress and comfortable.   Alone.  EYES: no pallor or icterus OROPHARYNX: no thrush or ulceration; good dentition  NECK: supple, no masses felt LYMPH:  no palpable lymphadenopathy in the cervical, axillary or inguinal regions LUNGS: clear to auscultation and  No wheeze or crackles HEART/CVS: regular rate & rhythm and no murmurs; No lower extremity edema ABDOMEN: abdomen soft, non-tender and normal bowel sounds Musculoskeletal:no cyanosis of digits and no clubbing  PSYCH: alert & oriented x 3 with fluent speech NEURO: no focal motor/sensory deficits SKIN:  no  rashes or significant lesions  LABORATORY DATA:  I have reviewed the data as listed Lab Results  Component Value Date   WBC 4.1 07/10/2016   HGB 8.9 (L) 07/10/2016   HCT 25.9 (L) 07/10/2016   MCV 90.4 07/10/2016   PLT 68 (L) 07/10/2016   Recent Labs    07/09/16 1036 07/10/16 0605 09/03/16 1027  NA 134* 140 144  K 3.1* 3.2* 3.6  CL 100* 113* 102  CO2 28 24 22   GLUCOSE 140* 106* 124*  BUN 29* 21* 9  CREATININE 1.32* 0.79 0.75  CALCIUM 9.1 8.0* 10.3  GFRNONAA 36* >60 74  GFRAA 42* >60 85    RADIOGRAPHIC STUDIES: I have personally reviewed the radiological images as listed and agreed with the findings in the report. No results found.  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  Most recent platelets 97 PCP office; hemoglobin white count normal.   # Review of patient's old CBC-shows patient had intermittent low platelets; near to being 99 in 2014.  Discussed multiple treatment options for ITP including-steroids; IVIG; thrombopoietin stimulating agent pills; also last splenectomy.  # CAD status post stenting on aspirin.  Patient is currently not on Plavix.  #Debility/falls-recommend patient use assistive devices to avoid fall.  # Recommend follow-up with me in 4 months with labs (CBC, BMP).   CC:Dr.Grandis.   All questions were answered. The patient knows to call the clinic with any problems, questions or concerns.  # 25 minutes face-to-face with the patient discussing the above plan of care; more than 50% of time spent on prognosis/ natural history; counseling and coordination.    Cammie Sickle, MD 05/11/2017 3:58 PM

## 2017-08-02 ENCOUNTER — Ambulatory Visit: Payer: Medicare Other | Admitting: Physician Assistant

## 2017-08-18 NOTE — Progress Notes (Signed)
Cardiology Office Note Date:  08/19/2017  Patient ID:  Dynasty, Holquin [birth year], MRN [id number] PCP:  [name], MD  Cardiologist:  Dr. Fletcher Anon, MD    Chief Complaint: Follow-up  History of Present Illness: DEANGELA RANDLEMAN is a 84 y.o. female with history of CAD status post PCI as detailed below, chronic diastolic CHF, AV block in the setting of her non-STEMI in 08/2015 with right posterior AV intervention as detailed below, stroke, subdural hematoma in 05/2013 in the setting of a fall, hypertension, hyperlipidemia, hepatic steatosis, diverticulitis, hypothyroidism, chronic thrombocytopenia followed by hematology dating back to 2014, osteoarthritis, and osteopenia who presents for follow-up of her CAD.  Patient was admitted to the hospital in 09/08/2015 with a non-STEMI.  LHC showed an occluded right posterior AV groove artery that was successfully treated with PCI/DES.  Case was complicated by hypotension and high grade AV block with intervention of the right posterior AV groove.  She did require temporary pacing post PCI though did not require PPM.  EF was normal.  There was also significant disease affecting the proximal and mid LAD as detailed below with medical therapy being recommended in the setting of her frail state and involvement of a large diagonal branch at the site of the stenosis.  Her post procedure course was further complicated by a stroke confirmed on MRI on 09/15/2015, without any residual neurologic deficits.  She was seen and follow-up in early 2018 with worsening dyspnea and underwent a Lexiscan Myoview that showed no evidence of ischemia.  There was evidence of prior infarct in the lateral wall with an EF of 34%.  This was followed up by a TTE showed an EF of 30 to 35% with severe hypokinesis of the inferior lateral and inferior wall, moderate MR, no evidence of pulmonary hypertension.  Continued medical therapy was recommended at that time given the atypical presentation of  her symptoms.  She was most recently seen in the office on 12/11/2016 for routine follow-up and had noted that her anxiety had significantly improved following the initiation of Zoloft.  She had been going to the swimming pool for exercise with improved stamina overall.  However she did continue to note exertional nausea and shortness of breath without chest pain.  Her nausea was felt to possibly be her anginal equivalent.  She was started on Imdur 30 mg daily.  Weight was noted to be 110 pounds.  She was felt to be euvolemic.  Patient comes in accompanied by her son today.  She indicates that since starting isosorbide mononitrate 30 mg daily her nausea improved however did not resolve until she started taking the Imdur at nighttime.  Since she started taking isosorbide mononitrate at nighttime she has not had any further nausea.  She is now back to swimming in the pool twice weekly.  She does note some fatigue following this however this is unchanged from her baseline fatigue per her report.  She notes some fatigue with ADLs however this too is unchanged from her baseline fatigue that she has had since the time of her MI per her report.  She is significantly anxious regarding her husband's health and worried that he may suffer a fall.  She reports he goes out to garden in the heat and she worries about this the entire time he is out there until he returns back inside.  She knows this is playing a significant role and her blood pressure elevations.  Her anxiety was previously improving with Zoloft  however it appears to be worsening.  Patient's son indicates this is an every day discussion with her.  She does not check her blood pressure at home though does have the ability to do so.  She has not noticed any significant rash.  No recent falls.   Past Medical History:  Diagnosis Date  . Anxiety   . AV block    a. 08/2015 in setting of NSTEMI and RPAV intervention-->required temp wire but not PPM.  . Back pain    . Chronic diastolic CHF (congestive heart failure) ([place])    a. 08/2015  Echo: EF 55-60%, no rwma, mod MR, mildly dil LA/RA, mod TR, PASP 41mmHg.  Marland Kitchen Coronary artery disease    a. 08/2015 NSTEMI/PCI: LM nl, LAD 75p (med rx), 20 diffuse, 50d, LCX nl, OM1/2 nl, RCA 75m, RPAV 100 (2.25x12 Promus Premier DES) - complicated by 2:1 HB req temp wire post-PCI.  Marland Kitchen Diverticulitis   . Generalized anxiety disorder   . Hepatic steatosis   . HLD (hyperlipidemia)   . Hypertension   . Hypokalemia   . Hypomagnesemia   . Hypothyroidism   . OA (osteoarthritis)   . Osteopenia   . Sleep apnea   . Stroke [facility])    a. 08/2015 Right PCA territory infarct/temoral lobe cerebral infarction-->MRA showed severe flow liminting stenosis of R P2 cerebral artery;  b. 08/2015 Carotid U/S: 1-39% bilat ICA stenosis.  . Subdural hemorrhage ([place])    a. 05/2013 in setting of fall.  . Thrombocytopenia ([place])   . Weakness     Past Surgical History:  Procedure Laterality Date  . CARDIAC CATHETERIZATION N/A 09/08/2015   Procedure: Temporary Pacemaker;  Surgeon: Leonie Man, MD;  Location: [place] [facility];  Service: Cardiovascular;  Laterality: N/A;  . CARDIAC CATHETERIZATION N/A 09/08/2015   Procedure: Left Heart Cath and Coronary Angiography;  Surgeon: Leonie Man, MD;  Location: [place] [facility];  Service: Cardiovascular;  Laterality: N/A;  . CARDIAC CATHETERIZATION N/A 09/08/2015   Procedure: Coronary Stent Intervention;  Surgeon: Leonie Man, MD;  Location: [place] [facility];  Service: Cardiovascular;  Laterality: N/A;  . CARDIAC CATHETERIZATION  09/08/2015   Procedure: Central Line Insertion;  Surgeon: Leonie Man, MD;  Location: [place] [facility];  Service: Cardiovascular;;  . CARDIAC CATHETERIZATION  09/08/2015   Procedure: Arterial Line Insertion;  Surgeon: Leonie Man, MD;  Location: [place] [facility];  Service: Cardiovascular;;  . CARDIAC CATHETERIZATION N/A 09/09/2015   Procedure: Temporary Wire;   Surgeon: Lorretta Harp, MD;  Location: [place] [facility];  Service: Cardiovascular;  Laterality: N/A;  . ELBOW SURGERY      Current Meds  Medication Sig  . acetaminophen (TYLENOL) 325 MG tablet Take 650 mg by mouth every 6 (six) hours as needed for mild pain, fever or headache.   . ALPRAZolam (XANAX) 0.25 MG tablet Take 1 tablet (0.25 mg total) by mouth at bedtime as needed for anxiety.  Marland Kitchen aspirin 81 MG chewable tablet Chew 1 tablet (81 mg total) by mouth daily.  Marland Kitchen atorvastatin (LIPITOR) 20 MG tablet Take 1 tablet (20 mg total) by mouth daily.  . calcium-vitamin D (OSCAL WITH D) 500-200 MG-UNIT tablet Take 1 tablet by mouth daily with breakfast.   . carvedilol (COREG) 3.125 MG tablet Take 3.125 mg by mouth 2 (two) times daily with a meal.   . cetirizine (ZYRTEC) 10 MG tablet Take 10 mg by mouth daily.  . fluocinonide (LIDEX) 0.05 %  external solution   . furosemide (LASIX) 20 MG tablet Take 20 mg by mouth daily.  Marland Kitchen levothyroxine (SYNTHROID, LEVOTHROID) 25 MCG tablet Take 25 mcg by mouth daily before breakfast.   . lisinopril (PRINIVIL,ZESTRIL) 40 MG tablet Take 40 mg by mouth daily.  . magnesium oxide (MAG-OX) 400 MG tablet Take 400 mg by mouth daily.  . meloxicam (MOBIC) 7.5 MG tablet Take 7.5 mg by mouth daily.  . Multiple Vitamins-Minerals (MULTIVITAMIN GUMMIES ADULT) CHEW Chew 2 each by mouth at bedtime.  . Multiple Vitamins-Minerals (PRESERVISION AREDS 2) CAPS Take 1 capsule by mouth daily.  . nitroGLYCERIN (NITROSTAT) 0.4 MG SL tablet Place 1 tablet (0.4 mg total) under the tongue every 5 (five) minutes x 3 doses as needed for chest pain.  Marland Kitchen omeprazole (PRILOSEC) 20 MG capsule Take 20 mg by mouth daily before breakfast.   . polyethylene glycol (MIRALAX / GLYCOLAX) packet Take 17 g by mouth daily as needed for mild constipation.  . senna-docusate (SENOKOT-S) 8.6-50 MG tablet Take 1 tablet by mouth as needed.   . sertraline (ZOLOFT) 25 MG tablet Take a half tablet daily x 1 week, then  1 po daily  . Simethicone 80 MG TABS Take 80 mg by mouth every 6 (six) hours as needed.  . sodium chloride (OCEAN) 0.65 % SOLN nasal spray Place 1 spray into both nostrils as needed for congestion.    Allergies:   Codeine; Flomax [tamsulosin]; Norco [hydrocodone-acetaminophen]; and Sulfa antibiotics   Social History:  The patient  reports that she has never smoked. She has never used smokeless tobacco. She reports that she does not drink alcohol or use drugs.   Family History:  The patient's family history includes Bipolar disorder in her sister; Diabetes in her sister; Hyperlipidemia in her mother, sister, and sister; Hypertension in her mother and sister; Prostate cancer in her brother; Stroke in her mother and sister.  ROS:   Review of Systems  Constitutional: Positive for malaise/fatigue. Negative for chills, diaphoresis, fever and weight loss.  HENT: Negative for congestion.   Eyes: Negative for discharge and redness.  Respiratory: Negative for cough, hemoptysis, sputum production, shortness of breath and wheezing.   Cardiovascular: Negative for chest pain, palpitations, orthopnea, claudication, leg swelling and PND.  Gastrointestinal: Negative for abdominal pain, blood in stool, heartburn, melena, nausea and vomiting.       Nausea resolved  Genitourinary: Negative for hematuria.  Musculoskeletal: Negative for falls and myalgias.  Skin: Negative for rash.  Neurological: Positive for weakness. Negative for dizziness, tingling, tremors, sensory change, speech change, focal weakness and loss of consciousness.  Endo/Heme/Allergies: Does not bruise/bleed easily.  Psychiatric/Behavioral: Negative for substance abuse. The patient is nervous/anxious.      PHYSICAL EXAM:  VS:  BP (!) 190/98 (BP Location: Left Arm, Patient Position: Sitting, Cuff Size: Normal)   Pulse 72   Ht 5\' 2"  (1.575 m)   Wt 116 lb 4 oz (52.7 kg)   BMI 21.26 kg/m  BMI: Body mass index is 21.26 kg/m.  Physical  Exam  Constitutional: She is oriented to person, place, and time. She appears well-developed and well-nourished.  HENT:  Head: Normocephalic and atraumatic.  Eyes: Right eye exhibits no discharge. Left eye exhibits no discharge.  Neck: Normal range of motion. No JVD present.  Cardiovascular: Normal rate, regular rhythm, S1 normal and S2 normal. Exam reveals no distant heart sounds, no friction rub, no midsystolic click and no opening snap.  Murmur heard.  Harsh midsystolic murmur is present  with a grade of 1/6 at the upper right sternal border radiating to the neck. Pulses:      Posterior tibial pulses are 2+ on the right side, and 2+ on the left side.  Pulmonary/Chest: Effort normal and breath sounds normal. No respiratory distress. She has no decreased breath sounds. She has no wheezes. She has no rales. She exhibits no tenderness.  Abdominal: Soft. She exhibits no distension. There is no tenderness.  Musculoskeletal: She exhibits no edema.  Neurological: She is alert and oriented to person, place, and time.  Skin: Skin is warm and dry. No cyanosis. Nails show no clubbing.  Psychiatric: She has a normal mood and affect. Her speech is normal and behavior is normal. Judgment and thought content normal.     EKG:  Was ordered and interpreted by me today. Shows NSR, 72 bpm, T wave inversion in aVL, nonspecific ST-T changes in V6  Recent Labs: 09/03/2016: BUN 9; Creatinine, Ser 0.75; Potassium 3.6; Sodium 144  No results found for requested labs within last 8760 hours.   CrCl cannot be calculated (Patient's most recent lab result is older than the maximum 21 days allowed.).   Wt Readings from Last 3 Encounters:  08/19/17 116 lb 4 oz (52.7 kg)  05/11/17 113 lb (51.3 kg)  12/11/16 110 lb 8 oz (50.1 kg)     Other studies reviewed: Additional studies/records reviewed today include: summarized above  ASSESSMENT AND PLAN:  1. CAD of native coronary arteries without angina: Patient reports  her nausea has resolved with taking isosorbide mononitrate in the evening hours.  She is noted to have new T wave inversion in lead aVL today.  However, she is asymptomatic from an angina standpoint and only notes fatigue.  I have discussed potential ischemic evaluations with both the patient and her son at length and in detail today.  They both prefer to postpone any ischemic evaluation at this time and are agreeable to proceed with a TTE to evaluate EF and wall motion.  Continue aspirin 81 mg daily as long as her platelet count allows.  Continuation of aspirin may need to be weighed in by hematology given her thrombocytopenia.   2. Chronic systolic CHF secondary to ischemic cardiomyopathy: She appears euvolemic on exam today.  Schedule echocardiogram to evaluate LV systolic function.  If this is found to be significantly reduced or if there are new wall motion abnormalities concerning for ischemia may need to revisit ischemic evaluation.  However, patient would prefer ischemic evaluation at this time.  Continue Coreg 3.125 mg twice daily, lisinopril 40 mg daily, and Lasix 20 mg daily.  In the setting of the patient's fatigue, if she is found to have stable EF we could consider transitioning her from carvedilol to Bystolic.  3. Essential hypertension: Blood pressure is poorly controlled today at 190/98.  With recheck at 182/98.  Patient and her son feel like this is secondary to anxiety with coming to the physician's office as well as the patient's worrying about her husband.  Patient will check her blood pressure at home over the next several days and call us with these readings.  They would prefer that I not titrate her medications based off the readings today.  For now, she will continue current medications and we will await their home readings.  4. Fatigue: Patient is back to swimming twice weekly.  Her fatigue is likely multifactorial including her CAD with ischemic cardiomyopathy, physical  deconditioning, and possible medication adverse effect.  Recent thyroid  function is normal.  Consider labs such as CBC and BMP.  Deferred to PCP.  Check echocardiogram as above.  5. Anxiety: This appears to be the patient's biggest issue at this time.  She is significantly worried about her husband's health and her husband going outside to garden with his fall risk, especially in the heat.  Patient reports she cannot get this off of her mind.  Patient's anxiety had previously been noted to be improving on Zoloft however this is significantly worse at this time.  I recommend the patient follow-up with PCP for escalation of medical therapy as this appears to be driving the patient's hypertension and her predominant complaint.  6. Hyperlipidemia: LDL of 60 from 10/2016.  Continue Lipitor 20 mg daily.  7. Thrombocytopenia: Most recent platelet count of 97 from 04/2017.  Followed by hematology.  Possible future ischemic evaluation may be dictated by the progression of her thrombocytopenia.  This was discussed with the patient and her son in detail today.  Patient declines recheck today.  Disposition: F/u with Dr. Fletcher Anon in 3 months.  Current medicines are reviewed at length with the patient today.  The patient did not have any concerns regarding medicines.  Signed, Christell Faith, PA-C 08/19/2017 1:29 PM     [address] [place],  [phone]

## 2017-08-19 ENCOUNTER — Encounter: Payer: Self-pay | Admitting: Physician Assistant

## 2017-08-19 ENCOUNTER — Ambulatory Visit: Payer: Medicare Other | Admitting: Physician Assistant

## 2017-08-19 VITALS — BP 190/98 | HR 72 | Ht 62.0 in | Wt 116.2 lb

## 2017-08-19 DIAGNOSIS — E785 Hyperlipidemia, unspecified: Secondary | ICD-10-CM | POA: Diagnosis not present

## 2017-08-19 DIAGNOSIS — R5383 Other fatigue: Secondary | ICD-10-CM | POA: Diagnosis not present

## 2017-08-19 DIAGNOSIS — D696 Thrombocytopenia, unspecified: Secondary | ICD-10-CM

## 2017-08-19 DIAGNOSIS — I2510 Atherosclerotic heart disease of native coronary artery without angina pectoris: Secondary | ICD-10-CM

## 2017-08-19 DIAGNOSIS — I255 Ischemic cardiomyopathy: Secondary | ICD-10-CM

## 2017-08-19 DIAGNOSIS — I10 Essential (primary) hypertension: Secondary | ICD-10-CM | POA: Diagnosis not present

## 2017-08-19 DIAGNOSIS — F419 Anxiety disorder, unspecified: Secondary | ICD-10-CM | POA: Diagnosis not present

## 2017-08-19 NOTE — Patient Instructions (Signed)
Testing/Procedures: Your physician has requested that you have an echocardiogram. Echocardiography is a painless test that uses sound waves to create images of your heart. It provides your doctor with information about the size and shape of your heart and how well your heart's chambers and valves are working. This procedure takes approximately one hour. There are no restrictions for this procedure.    Follow-Up: Your physician recommends that you schedule a follow-up appointment in: 3 months with Dr. Fletcher Anon.  It was a pleasure seeing you today here in the office. Please do not hesitate to give [place] a call back if you have any further questions. [place], BSN

## 2017-08-23 ENCOUNTER — Telehealth: Payer: Self-pay | Admitting: Cardiovascular Disease

## 2017-08-23 NOTE — Telephone Encounter (Signed)
BP Log  5/30 Day 196/38    Night  153/86   5/31  156/96  6/1  130/71  6/2  149/66  6/3  146/91

## 2017-08-23 NOTE — Telephone Encounter (Signed)
Routing to [place] to review as he saw her on 08/19/17.

## 2017-08-24 MED ORDER — CARVEDILOL 6.25 MG PO TABS: 6.2500 mg | ORAL_TABLET | Freq: Two times a day (BID) | ORAL | 3 refills | Status: DC

## 2017-08-24 NOTE — Telephone Encounter (Signed)
Home BP readings are better, though we still have some room for improvement. We can increase her Coreg to 6.25 mg bid if she is willing.

## 2017-08-24 NOTE — Telephone Encounter (Signed)
Patient verbalized understanding and agreeable to increase carvedilol to 6.25 mg by mouth two times a day. Rx sent to verified pharmacy.

## 2017-08-31 ENCOUNTER — Other Ambulatory Visit: Payer: Medicare Other

## 2017-09-07 ENCOUNTER — Inpatient Hospital Stay: Payer: Medicare Other | Attending: Internal Medicine | Admitting: Oncology

## 2017-09-07 ENCOUNTER — Inpatient Hospital Stay: Payer: Medicare Other

## 2017-09-07 VITALS — BP 177/78 | HR 69 | Temp 97.2°F | Resp 16 | Wt 115.4 lb

## 2017-09-07 DIAGNOSIS — D696 Thrombocytopenia, unspecified: Secondary | ICD-10-CM

## 2017-09-07 DIAGNOSIS — I2510 Atherosclerotic heart disease of native coronary artery without angina pectoris: Secondary | ICD-10-CM | POA: Diagnosis not present

## 2017-09-07 DIAGNOSIS — Z7982 Long term (current) use of aspirin: Secondary | ICD-10-CM | POA: Diagnosis not present

## 2017-09-07 LAB — CBC WITH DIFFERENTIAL/PLATELET
Basophils Absolute: 0 10*3/uL (ref 0–0.1)
Basophils Relative: 1 %
EOS ABS: 0.1 10*3/uL (ref 0–0.7)
EOS PCT: 2 %
HCT: 40.7 % (ref 35.0–47.0)
Hemoglobin: 13.8 g/dL (ref 12.0–16.0)
LYMPHS ABS: 1 10*3/uL (ref 1.0–3.6)
Lymphocytes Relative: 22 %
MCH: 30.8 pg (ref 26.0–34.0)
MCHC: 33.9 g/dL (ref 32.0–36.0)
MCV: 91.1 fL (ref 80.0–100.0)
MONO ABS: 0.3 10*3/uL (ref 0.2–0.9)
MONOS PCT: 7 %
Neutro Abs: 3.1 10*3/uL (ref 1.4–6.5)
Neutrophils Relative %: 68 %
PLATELETS: 102 10*3/uL — AB (ref 150–440)
RBC: 4.47 MIL/uL (ref 3.80–5.20)
RDW: 14.3 % (ref 11.5–14.5)
WBC: 4.5 10*3/uL (ref 3.6–11.0)

## 2017-09-07 LAB — COMPREHENSIVE METABOLIC PANEL
ALT: 15 U/L (ref 14–54)
AST: 30 U/L (ref 15–41)
Albumin: 4.2 g/dL (ref 3.5–5.0)
Alkaline Phosphatase: 64 U/L (ref 38–126)
Anion gap: 11 (ref 5–15)
BUN: 15 mg/dL (ref 6–20)
CHLORIDE: 102 mmol/L (ref 101–111)
CO2: 29 mmol/L (ref 22–32)
CREATININE: 0.83 mg/dL (ref 0.44–1.00)
Calcium: 9.8 mg/dL (ref 8.9–10.3)
GFR calc Af Amer: >60 mL/min (ref >60)
GLUCOSE: 104 mg/dL — AB (ref 65–99)
Potassium: 4.8 mmol/L (ref 3.5–5.1)
SODIUM: 142 mmol/L (ref 135–145)
Total Bilirubin: 0.6 mg/dL (ref 0.3–1.2)
Total Protein: 7 g/dL (ref 6.5–8.1)

## 2017-09-07 NOTE — Assessment & Plan Note (Addendum)
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   Most recent platelet count is 101.  Hemoglobin and white count normal.  At last visit Dr. Rogue Bussing discussed treatment options for ITP including steroids, IVIG, thrombopoietin stimulating agent pills and lastly a splenectomy.  Most recent falls: Recommended the use of her assistive devices to avoid falls.  Continue exercise 3 times a week.   she is involved in her community aqua center and participates in aqua classes.  States this is helping with her strength.  CAD: S/p stenting on aspirin.  Not on Plavix.  RTC in 6 months with labs (CBC, BMP) and MD assessment.

## 2017-09-07 NOTE — Progress Notes (Signed)
[facility] NOTE  Patient Care Team: [name], MD as PCP - General (Family Medicine)  CHIEF COMPLAINTS   # CHRONIC THROMBOCYTOPENIA ~ 90-100 [since 2014]  # July 2017 CAD s/p Stenting & Stroke [Hx of cardiogenic shock]   # ? GIB [colonoscopy- divertciulosis [facility] 2012]  HISTORY OF PRESENTING ILLNESS:  Patient presents today for follow-up of chronic thrombocytopenia currently under surveillance.  Her platelet count began to get low in June 2017.  Platelet counts have ranged between 68-116.   She has had 3 falls in the past year. Last fall with admission to the hospital was April 2018 where she fractured her right elbow.  All have been very traumatic.  She has been taken off of her Plavix.   She admits to mild bruising but denies any spontaneous bleeding.  Denies GI bleeding, gingival bleeding or epistasis.  She denies the use of alcohol.  ROS: Review of Systems  Constitutional: Positive for malaise/fatigue. Negative for chills, fever and weight loss.  HENT: Negative for congestion and ear pain.   Eyes: Negative.  Negative for blurred vision and double vision.  Respiratory: Negative.  Negative for cough, sputum production and shortness of breath.   Cardiovascular: Negative.  Negative for chest pain, palpitations and leg swelling.  Gastrointestinal: Negative.  Negative for abdominal pain, constipation, diarrhea, nausea and vomiting.  Genitourinary: Negative for dysuria, frequency and urgency.  Musculoskeletal: Negative for back pain and falls.  Skin: Negative.  Negative for rash.  Neurological: Positive for weakness. Negative for headaches.  Endo/Heme/Allergies: Negative.  Does not bruise/bleed easily.  Psychiatric/Behavioral: Negative.  Negative for depression. The patient is not nervous/anxious and does not have insomnia.      MEDICAL HISTORY:  Past Medical History:  Diagnosis Date  . Anxiety   . AV block    a. 08/2015 in setting of NSTEMI and  RPAV intervention-->required temp wire but not PPM.  . Back pain   . Chronic diastolic CHF (congestive heart failure) ([place])    a. 08/2015  Echo: EF 55-60%, no rwma, mod MR, mildly dil LA/RA, mod TR, PASP 4mmHg.  Marland Kitchen Coronary artery disease    a. 08/2015 NSTEMI/PCI: LM nl, LAD 75p (med rx), 20 diffuse, 50d, LCX nl, OM1/2 nl, RCA 68m, RPAV 100 (2.25x12 Promus Premier DES) - complicated by 2:1 HB req temp wire post-PCI.  Marland Kitchen Diverticulitis   . Generalized anxiety disorder   . Hepatic steatosis   . HLD (hyperlipidemia)   . Hypertension   . Hypokalemia   . Hypomagnesemia   . Hypothyroidism   . OA (osteoarthritis)   . Osteopenia   . Sleep apnea   . Stroke [facility])    a. 08/2015 Right PCA territory infarct/temoral lobe cerebral infarction-->MRA showed severe flow liminting stenosis of R P2 cerebral artery;  b. 08/2015 Carotid U/S: 1-39% bilat ICA stenosis.  . Subdural hemorrhage ([place])    a. 05/2013 in setting of fall.  . Thrombocytopenia ([place])   . Weakness     SURGICAL HISTORY: Past Surgical History:  Procedure Laterality Date  . CARDIAC CATHETERIZATION N/A 09/08/2015   Procedure: Temporary Pacemaker;  Surgeon: Leonie Man, MD;  Location: [place] [facility];  Service: Cardiovascular;  Laterality: N/A;  . CARDIAC CATHETERIZATION N/A 09/08/2015   Procedure: Left Heart Cath and Coronary Angiography;  Surgeon: Leonie Man, MD;  Location: [place] [facility];  Service: Cardiovascular;  Laterality: N/A;  . CARDIAC CATHETERIZATION N/A 09/08/2015   Procedure: Coronary Stent Intervention;  Surgeon:  Leonie Man, MD;  Location: [place] [facility];  Service: Cardiovascular;  Laterality: N/A;  . CARDIAC CATHETERIZATION  09/08/2015   Procedure: Central Line Insertion;  Surgeon: Leonie Man, MD;  Location: [place] [facility];  Service: Cardiovascular;;  . CARDIAC CATHETERIZATION  09/08/2015   Procedure: Arterial Line Insertion;  Surgeon: Leonie Man, MD;  Location: [place] [facility];  Service:  Cardiovascular;;  . CARDIAC CATHETERIZATION N/A 09/09/2015   Procedure: Temporary Wire;  Surgeon: Lorretta Harp, MD;  Location: [place] [facility];  Service: Cardiovascular;  Laterality: N/A;  . ELBOW SURGERY      SOCIAL HISTORY: worked until until Tax adviser- retd; no smoking/alcohol/ lives with husband in [place].  Social History   Socioeconomic History  . Marital status: Married    Spouse name: Not on file  . Number of children: Not on file  . Years of education: Not on file  . Highest education level: Not on file  Occupational History  . Not on file  Social Needs  . Financial resource strain: Not on file  . Food insecurity:    Worry: Not on file    Inability: Not on file  . Transportation needs:    Medical: Not on file    Non-medical: Not on file  Tobacco Use  . Smoking status: Never Smoker  . Smokeless tobacco: Never Used  Substance and Sexual Activity  . Alcohol use: No    Alcohol/week: 0.0 oz  . Drug use: No  . Sexual activity: Never  Lifestyle  . Physical activity:    Days per week: Not on file    Minutes per session: Not on file  . Stress: Not on file  Relationships  . Social connections:    Talks on phone: Not on file    Gets together: Not on file    Attends religious service: Not on file    Active member of club or organization: Not on file    Attends meetings of clubs or organizations: Not on file    Relationship status: Not on file  . Intimate partner violence:    Fear of current or ex partner: Not on file    Emotionally abused: Not on file    Physically abused: Not on file    Forced sexual activity: Not on file  Other Topics Concern  . Not on file  Social History Narrative  . Not on file    FAMILY HISTORY: Family History  Problem Relation Age of Onset  . Hypertension Mother   . Hyperlipidemia Mother   . Stroke Mother   . Diabetes Sister   . Hypertension Sister   . Hyperlipidemia Sister   . Bipolar disorder Sister   . Hyperlipidemia  Sister   . Stroke Sister   . Prostate cancer Brother   . Breast cancer Neg Hx     ALLERGIES:  is allergic to codeine; flomax [tamsulosin]; norco [hydrocodone-acetaminophen]; and sulfa antibiotics.  MEDICATIONS:  Current Outpatient Medications  Medication Sig Dispense Refill  . acetaminophen (TYLENOL) 325 MG tablet Take 650 mg by mouth every 6 (six) hours as needed for mild pain, fever or headache.     . ALPRAZolam (XANAX) 0.25 MG tablet Take 1 tablet (0.25 mg total) by mouth at bedtime as needed for anxiety. 15 tablet 0  . aspirin 81 MG chewable tablet Chew 1 tablet (81 mg total) by mouth daily.    Marland Kitchen atorvastatin (LIPITOR) 20 MG tablet Take 1 tablet (20 mg total)  by mouth daily. 30 tablet 5  . calcium-vitamin D (OSCAL WITH D) 500-200 MG-UNIT tablet Take 1 tablet by mouth daily with breakfast.     . carvedilol (COREG) 6.25 MG tablet Take 1 tablet (6.25 mg total) by mouth 2 (two) times daily. 180 tablet 3  . cetirizine (ZYRTEC) 10 MG tablet Take 10 mg by mouth daily.    . fluocinonide (LIDEX) 0.05 % external solution     . furosemide (LASIX) 20 MG tablet Take 20 mg by mouth daily.    Marland Kitchen levothyroxine (SYNTHROID, LEVOTHROID) 25 MCG tablet Take 25 mcg by mouth daily before breakfast.     . lisinopril (PRINIVIL,ZESTRIL) 40 MG tablet Take 40 mg by mouth daily.    . magnesium oxide (MAG-OX) 400 MG tablet Take 400 mg by mouth daily.    . meloxicam (MOBIC) 7.5 MG tablet Take 7.5 mg by mouth daily.    . Multiple Vitamins-Minerals (MULTIVITAMIN GUMMIES ADULT) CHEW Chew 2 each by mouth at bedtime.    . Multiple Vitamins-Minerals (PRESERVISION AREDS 2) CAPS Take 1 capsule by mouth daily.    . nitroGLYCERIN (NITROSTAT) 0.4 MG SL tablet Place 1 tablet (0.4 mg total) under the tongue every 5 (five) minutes x 3 doses as needed for chest pain. 25 tablet 2  . omeprazole (PRILOSEC) 20 MG capsule Take 20 mg by mouth daily before breakfast.     . polyethylene glycol (MIRALAX / GLYCOLAX) packet Take 17 g by  mouth daily as needed for mild constipation. 14 each 0  . senna-docusate (SENOKOT-S) 8.6-50 MG tablet Take 1 tablet by mouth as needed.     . sertraline (ZOLOFT) 25 MG tablet Take a half tablet daily x 1 week, then 1 po daily    . Simethicone 80 MG TABS Take 80 mg by mouth every 6 (six) hours as needed.    . sodium chloride (OCEAN) 0.65 % SOLN nasal spray Place 1 spray into both nostrils as needed for congestion. 1 Bottle 0  . isosorbide mononitrate (IMDUR) 30 MG 24 hr tablet Take 1 tablet (30 mg total) by mouth daily. 90 tablet 3   No current facility-administered medications for this visit.       Marland Kitchen  PHYSICAL EXAMINATION:   Vitals:   09/07/17 1352  BP: (!) 177/78  Pulse: 69  Resp: 16  Temp: (!) 97.2 F (36.2 C)   Filed Weights   09/07/17 1352  Weight: 115 lb 6.6 oz (52.3 kg)   Physical Exam  Constitutional: She is oriented to person, place, and time and well-developed, well-nourished, and in no distress. Vital signs are normal.  HENT:  Head: Normocephalic and atraumatic.  Eyes: Pupils are equal, round, and reactive to light.  Neck: Normal range of motion.  Cardiovascular: Normal rate, regular rhythm and normal heart sounds.  No murmur heard. Pulmonary/Chest: Effort normal and breath sounds normal. She has no wheezes.  Abdominal: Soft. Normal appearance and bowel sounds are normal. She exhibits no distension. There is no tenderness.  Musculoskeletal: Normal range of motion. She exhibits no edema.  Neurological: She is alert and oriented to person, place, and time. Gait normal.  Skin: Skin is warm and dry. No rash noted.  Psychiatric: Mood, memory, affect and judgment normal.     LABORATORY DATA:  I have reviewed the data as listed Lab Results  Component Value Date   WBC 4.5 09/07/2017   HGB 13.8 09/07/2017   HCT 40.7 09/07/2017   MCV 91.1 09/07/2017   PLT 102 (L)  09/07/2017   Recent Labs    09/07/17 1337  NA 142  K 4.8  CL 102  CO2 29  GLUCOSE 104*  BUN  15  CREATININE 0.83  CALCIUM 9.8  GFRNONAA >60  GFRAA >60  PROT 7.0  ALBUMIN 4.2  AST 30  ALT 15  ALKPHOS 64  BILITOT 0.6    RADIOGRAPHIC STUDIES: I have personally reviewed the radiological images as listed and agreed with the findings in the report. No results found.  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  Most recent platelets 97 PCP office; hemoglobin white count normal.   # Review of patient's old CBC-shows patient had intermittent low platelets; near to being 99 in 2014.  Discussed multiple treatment options for ITP including-steroids; IVIG; thrombopoietin stimulating agent pills; also last splenectomy.  # CAD status post stenting on aspirin.  Patient is currently not on Plavix.  #Debility/falls-recommend patient use assistive devices to avoid fall.  # Recommend follow-up with me in 4 months with labs (CBC, BMP).   CC:Dr.Grandis.   All questions were answered. The patient knows to call the clinic with any problems, questions or concerns.  # 25 minutes face-to-face with the patient discussing the above plan of care; more than 50% of time spent on prognosis/ natural history; counseling and coordination.    Jacquelin Hawking, NP 09/07/2017 2:04 PM

## 2017-09-08 ENCOUNTER — Other Ambulatory Visit: Payer: Self-pay

## 2017-09-08 ENCOUNTER — Emergency Department
Admission: EM | Admit: 2017-09-08 | Discharge: 2017-09-08 | Disposition: A | Discharge status: Home / Self Care | Payer: Medicare Other | Attending: Emergency Medicine | Admitting: Emergency Medicine

## 2017-09-08 ENCOUNTER — Encounter: Payer: Self-pay | Admitting: *Deleted

## 2017-09-08 ENCOUNTER — Emergency Department: Payer: Medicare Other

## 2017-09-08 DIAGNOSIS — I110 Hypertensive heart disease with heart failure: Secondary | ICD-10-CM | POA: Insufficient documentation

## 2017-09-08 DIAGNOSIS — E039 Hypothyroidism, unspecified: Secondary | ICD-10-CM | POA: Insufficient documentation

## 2017-09-08 DIAGNOSIS — Z7982 Long term (current) use of aspirin: Secondary | ICD-10-CM | POA: Diagnosis not present

## 2017-09-08 DIAGNOSIS — I5032 Chronic diastolic (congestive) heart failure: Secondary | ICD-10-CM | POA: Diagnosis not present

## 2017-09-08 DIAGNOSIS — Z79899 Other long term (current) drug therapy: Secondary | ICD-10-CM | POA: Insufficient documentation

## 2017-09-08 DIAGNOSIS — I2510 Atherosclerotic heart disease of native coronary artery without angina pectoris: Secondary | ICD-10-CM | POA: Insufficient documentation

## 2017-09-08 DIAGNOSIS — R110 Nausea: Secondary | ICD-10-CM | POA: Insufficient documentation

## 2017-09-08 DIAGNOSIS — D696 Thrombocytopenia, unspecified: Secondary | ICD-10-CM | POA: Diagnosis not present

## 2017-09-08 LAB — COMPREHENSIVE METABOLIC PANEL
ALBUMIN: 4.2 g/dL (ref 3.5–5.0)
ALT: 11 U/L — ABNORMAL LOW (ref 14–54)
ANION GAP: 10 (ref 5–15)
AST: 39 U/L (ref 15–41)
Alkaline Phosphatase: 59 U/L (ref 38–126)
BUN: 10 mg/dL (ref 6–20)
CHLORIDE: 106 mmol/L (ref 101–111)
CO2: 24 mmol/L (ref 22–32)
Calcium: 9.3 mg/dL (ref 8.9–10.3)
Creatinine, Ser: 0.68 mg/dL (ref 0.44–1.00)
GFR calc Af Amer: >60 mL/min (ref >60)
GLUCOSE: 100 mg/dL — AB (ref 65–99)
POTASSIUM: 4 mmol/L (ref 3.5–5.1)
Sodium: 140 mmol/L (ref 135–145)
Total Bilirubin: 1.6 mg/dL — ABNORMAL HIGH (ref 0.3–1.2)
Total Protein: 7 g/dL (ref 6.5–8.1)

## 2017-09-08 LAB — LIPASE, BLOOD: LIPASE: 35 U/L (ref 11–51)

## 2017-09-08 LAB — CBC WITH DIFFERENTIAL/PLATELET
Basophils Absolute: 0 10*3/uL (ref 0–0.1)
Basophils Relative: 0 %
Eosinophils Absolute: 0.1 10*3/uL (ref 0–0.7)
Eosinophils Relative: 2 %
HEMATOCRIT: 41 % (ref 35.0–47.0)
HEMOGLOBIN: 14.5 g/dL (ref 12.0–16.0)
LYMPHS ABS: 1 10*3/uL (ref 1.0–3.6)
LYMPHS PCT: 23 %
MCH: 31.9 pg (ref 26.0–34.0)
MCHC: 35.3 g/dL (ref 32.0–36.0)
MCV: 90.4 fL (ref 80.0–100.0)
MONOS PCT: 8 %
Monocytes Absolute: 0.3 10*3/uL (ref 0.2–0.9)
NEUTROS ABS: 2.9 10*3/uL (ref 1.4–6.5)
Neutrophils Relative %: 67 %
Platelets: 103 10*3/uL — ABNORMAL LOW (ref 150–440)
RBC: 4.53 MIL/uL (ref 3.80–5.20)
RDW: 14.7 % — ABNORMAL HIGH (ref 11.5–14.5)
WBC: 4.3 10*3/uL (ref 3.6–11.0)

## 2017-09-08 LAB — TROPONIN I: Troponin I: <0.03 ng/mL (ref <0.03)

## 2017-09-08 MED ORDER — METOCLOPRAMIDE HCL 5 MG/ML IJ SOLN
5.0000 mg | Freq: Once | INTRAMUSCULAR | Status: AC
  Administered 2017-09-08: 5 mg via INTRAVENOUS
  Filled 2017-09-08: qty 2

## 2017-09-08 MED ORDER — ONDANSETRON 4 MG PO TBDP: 4.0000 mg | ORAL_TABLET | Freq: Three times a day (TID) | ORAL | 0 refills | Status: DC | PRN

## 2017-09-08 NOTE — ED Provider Notes (Signed)
[facility] Emergency Department Provider Note  ____________________________________________   First MD Initiated Contact with Patient 09/08/17 1528     (approximate)  I have reviewed the triage vital signs and the nursing notes.   HISTORY  Chief Complaint Nausea    HPI Teresa Franklin is a 84 y.o. female was the emergency department via EMS with epigastric nausea and "discomfort" that began this morning at 4 AM.  She was asleep when she awoke to go to the bathroom and she has had a persistent uncomfortable feeling ever since.  She describes it "not like pain but I just do not feel right".  It feels similar to when she had a previous non-STEMI.  She takes aspirin and follows with Dr. Fletcher Anon.  She denies chest pain or shortness of breath.  Her symptoms are constant.  Nothing seems to make them better or worse.  They are non-positional.  Nonradiating.  No diaphoresis.  She is able to eat and drink.  No abdominal pain.  Past Medical History:  Diagnosis Date  . Anxiety   . AV block    a. 08/2015 in setting of NSTEMI and RPAV intervention-->required temp wire but not PPM.  . Back pain   . Chronic diastolic CHF (congestive heart failure) ([place])    a. 08/2015  Echo: EF 55-60%, no rwma, mod MR, mildly dil LA/RA, mod TR, PASP 74mmHg.  Marland Kitchen Coronary artery disease    a. 08/2015 NSTEMI/PCI: LM nl, LAD 75p (med rx), 20 diffuse, 50d, LCX nl, OM1/2 nl, RCA 65m, RPAV 100 (2.25x12 Promus Premier DES) - complicated by 2:1 HB req temp wire post-PCI.  Marland Kitchen Diverticulitis   . Generalized anxiety disorder   . Hepatic steatosis   . HLD (hyperlipidemia)   . Hypertension   . Hypokalemia   . Hypomagnesemia   . Hypothyroidism   . OA (osteoarthritis)   . Osteopenia   . Sleep apnea   . Stroke [facility])    a. 08/2015 Right PCA territory infarct/temoral lobe cerebral infarction-->MRA showed severe flow liminting stenosis of R P2 cerebral artery;  b. 08/2015 Carotid U/S: 1-39% bilat ICA stenosis.    . Subdural hemorrhage ([place])    a. 05/2013 in setting of fall.  . Thrombocytopenia ([place])   . Weakness     Patient Active Problem List   Diagnosis Date Noted  . Anxiety disorder 08/05/2016  . Preop cardiovascular exam   . Elbow fracture, right 07/07/2016  . Thrombocytopenia ([place]) 11/12/2015  . Diastolic CHF, acute on chronic ([place]) 09/17/2015  . SOB (shortness of breath)   . UTI (urinary tract infection) 09/16/2015  . Acute right MCA stroke ([place])   . Fall   . Hypokalemia 09/13/2015  . Hypophosphatemia 09/12/2015  . Encounter for intubation   . Acute respiratory failure ([place])   . Complete heart block ([place]) 09/08/2015  . Acute blood loss anemia -thought to be related to in her muscular injection of Phenergan 09/08/2015  . Acute respiratory failure with hypoxia ([place]) 09/08/2015  . Heart block AV second degree   . Essential hypertension   . Arterial hypotension   . Cardiogenic shock ([place])   . NSTEMI (non-ST elevated myocardial infarction) ([place]) 09/06/2015  . Subdural hemorrhage ([facility]) -history of     Class: History of  . Hyperlipidemia with target LDL less than 70   . Hepatic steatosis     Past Surgical History:  Procedure Laterality Date  . CARDIAC CATHETERIZATION N/A 09/08/2015   Procedure: Temporary Pacemaker;  Surgeon: Leonie Man, MD;  Location: [place] [facility];  Service: Cardiovascular;  Laterality: N/A;  . CARDIAC CATHETERIZATION N/A 09/08/2015   Procedure: Left Heart Cath and Coronary Angiography;  Surgeon: Leonie Man, MD;  Location: [place] [facility];  Service: Cardiovascular;  Laterality: N/A;  . CARDIAC CATHETERIZATION N/A 09/08/2015   Procedure: Coronary Stent Intervention;  Surgeon: Leonie Man, MD;  Location: [place] [facility];  Service: Cardiovascular;  Laterality: N/A;  . CARDIAC CATHETERIZATION  09/08/2015   Procedure: Central Line Insertion;  Surgeon: Leonie Man, MD;  Location: [place] [facility];  Service: Cardiovascular;;  . CARDIAC  CATHETERIZATION  09/08/2015   Procedure: Arterial Line Insertion;  Surgeon: Leonie Man, MD;  Location: [place] [facility];  Service: Cardiovascular;;  . CARDIAC CATHETERIZATION N/A 09/09/2015   Procedure: Temporary Wire;  Surgeon: Lorretta Harp, MD;  Location: [place] [facility];  Service: Cardiovascular;  Laterality: N/A;  . ELBOW SURGERY      Prior to Admission medications   Medication Sig Start Date End Date Taking? Authorizing Provider  acetaminophen (TYLENOL) 325 MG tablet Take 650 mg by mouth every 6 (six) hours as needed for mild pain, fever or headache.     [provider]  ALPRAZolam Duanne Moron) 0.25 MG tablet Take 1 tablet (0.25 mg total) by mouth at bedtime as needed for anxiety. 07/10/16   Gladstone Lighter, MD  aspirin 81 MG chewable tablet Chew 1 tablet (81 mg total) by mouth daily. 09/13/15   Lyda Jester M, PA-C  atorvastatin (LIPITOR) 20 MG tablet Take 1 tablet (20 mg total) by mouth daily. 09/30/15   [name], MD  calcium-vitamin D (OSCAL WITH D) 500-200 MG-UNIT tablet Take 1 tablet by mouth daily with breakfast.     [provider]  carvedilol (COREG) 6.25 MG tablet Take 1 tablet (6.25 mg total) by mouth 2 (two) times daily. 08/24/17   Dunn, Areta Haber, PA-C  cetirizine (ZYRTEC) 10 MG tablet Take 10 mg by mouth daily.    [provider]  fluocinonide (LIDEX) 0.05 % external solution  10/28/15   [provider]  furosemide (LASIX) 20 MG tablet Take 20 mg by mouth daily.    [provider]  isosorbide mononitrate (IMDUR) 30 MG 24 hr tablet Take 1 tablet (30 mg total) by mouth daily. 12/11/16 05/11/17  [name], MD  levothyroxine (SYNTHROID, LEVOTHROID) 25 MCG tablet Take 25 mcg by mouth daily before breakfast.     [provider]  lisinopril (PRINIVIL,ZESTRIL) 40 MG tablet Take 40 mg by mouth daily.    [provider]  magnesium oxide (MAG-OX) 400 MG tablet Take 400 mg by mouth daily.    [provider]  meloxicam (MOBIC) 7.5 MG tablet Take 7.5 mg by mouth daily.    [provider]  Multiple Vitamins-Minerals (MULTIVITAMIN GUMMIES ADULT) CHEW Chew 2 each by mouth at bedtime.    [provider]  Multiple Vitamins-Minerals (PRESERVISION AREDS 2) CAPS Take 1 capsule by mouth daily.    [provider]  nitroGLYCERIN (NITROSTAT) 0.4 MG SL tablet Place 1 tablet (0.4 mg total) under the tongue every 5 (five) minutes x 3 doses as needed for chest pain. 09/13/15   Lyda Jester M, PA-C  omeprazole (PRILOSEC) 20 MG capsule Take 20 mg by mouth daily before breakfast.     [provider]  ondansetron (ZOFRAN ODT) 4 MG disintegrating tablet Take 1 tablet (4 mg total) by  mouth every 8 (eight) hours as needed for nausea or vomiting. 09/08/17   Darel Hong, MD  polyethylene glycol (MIRALAX / GLYCOLAX) packet Take 17 g by mouth daily as needed for mild constipation. 07/10/16   Gladstone Lighter, MD  senna-docusate (SENOKOT-S) 8.6-50 MG tablet Take 1 tablet by mouth as needed.     [provider]  sertraline (ZOLOFT) 25 MG tablet Take a half tablet daily x 1 week, then 1 po daily 11/20/16   [provider]  Simethicone 80 MG TABS Take 80 mg by mouth every 6 (six) hours as needed.    [provider]  sodium chloride (OCEAN) 0.65 % SOLN nasal spray Place 1 spray into both nostrils as needed for congestion. 09/18/15   Cristal Ford, DO    Allergies Codeine; Flomax [tamsulosin]; Norco [hydrocodone-acetaminophen]; and Sulfa antibiotics  Family History  Problem Relation Age of Onset  . Hypertension Mother   . Hyperlipidemia Mother   . Stroke Mother   . Diabetes Sister   . Hypertension Sister   . Hyperlipidemia Sister   . Bipolar disorder Sister   . Hyperlipidemia Sister   . Stroke Sister   . Prostate cancer Brother   . Breast cancer Neg Hx     Social History Social History   Tobacco Use  . Smoking status: Never Smoker    . Smokeless tobacco: Never Used  Substance Use Topics  . Alcohol use: No    Alcohol/week: 0.0 oz  . Drug use: No    Review of Systems Constitutional: No fever/chills Eyes: No visual changes. ENT: No sore throat. Cardiovascular: Denies chest pain. Respiratory: Denies shortness of breath. Gastrointestinal: No abdominal pain.  Positive for nausea, no vomiting.  No diarrhea.  No constipation. Genitourinary: Negative for dysuria. Musculoskeletal: Negative for back pain. Skin: Negative for rash. Neurological: Negative for headaches, focal weakness or numbness.   ____________________________________________   PHYSICAL EXAM:  VITAL SIGNS: ED Triage Vitals  Enc Vitals Group     BP      Pulse      Resp      Temp      Temp src      SpO2      Weight      Height      Head Circumference      Peak Flow      Pain Score      Pain Loc      Pain Edu?      Excl. in [place]?     Constitutional: Alert and oriented x4 pleasant cooperative speaks in full clear sentences no diaphoresis Eyes: PERRL EOMI. Head: Atraumatic. Nose: No congestion/rhinnorhea. Mouth/Throat: No trismus Neck: No stridor.   Cardiovascular: Normal rate, regular rhythm. Grossly normal heart sounds.  Good peripheral circulation. Respiratory: Normal respiratory effort.  No retractions. Lungs CTAB and moving good air Gastrointestinal: Soft nondistended nontender no rebound or guarding no peritonitis Musculoskeletal: No lower extremity edema   Neurologic:  Normal speech and language. No gross focal neurologic deficits are appreciated. Skin:  Skin is warm, dry and intact. No rash noted. Psychiatric: Mood and affect are normal. Speech and behavior are normal.    ____________________________________________   DIFFERENTIAL includes but not limited to  Acute coronary syndrome, pancreatitis, biliary disease, abdominal catastrophe ____________________________________________   LABS (all labs ordered are listed, but  only abnormal results are displayed)  Labs Reviewed  COMPREHENSIVE METABOLIC PANEL - Abnormal; Notable for the following components:      Result Value  Glucose, Bld 100 (*)    ALT 11 (*)    Total Bilirubin 1.6 (*)    All other components within normal limits  CBC WITH DIFFERENTIAL/PLATELET - Abnormal; Notable for the following components:   RDW 14.7 (*)    Platelets 103 (*)    All other components within normal limits  LIPASE, BLOOD  TROPONIN I    Work reviewed by me with no acute disease __________________________________________  EKG  ED ECG REPORT I, Darel Hong, the attending physician, personally viewed and interpreted this ECG.  Date: 09/08/2017 EKG Time:  Rate: 65 Rhythm: normal sinus rhythm QRS Axis: normal Intervals: normal ST/T Wave abnormalities: LVH with repolarization abnormalities consistent with old Narrative Interpretation: no evidence of acute ischemia  ____________________________________________  RADIOLOGY  Chest x-ray reviewed by me with no acute disease ____________________________________________   PROCEDURES  Procedure(s) performed: no  Procedures  Critical Care performed: no  Observation: no ____________________________________________   INITIAL IMPRESSION / ASSESSMENT AND PLAN / ED COURSE  Pertinent labs & imaging results that were available during my care of the patient were reviewed by me and considered in my medical decision making (see chart for details).       ----------------------------------------- 4:53 PM on 09/08/2017 -----------------------------------------  The patient's symptoms are improved and she is able to keep down food.  Fortunately today her EKG is nonischemic and her troponin is negative and her symptoms began 12 hours ago.  She already has cardiology follow-up in 6 days.  At this point she is medically stable for outpatient management verbalizes understanding and agree with the plan with strict  return precautions given. ____________________________________________   FINAL CLINICAL IMPRESSION(S) / ED DIAGNOSES  Final diagnoses:  Nausea      NEW MEDICATIONS STARTED DURING THIS VISIT:  Discharge Medication List as of 09/08/2017  4:53 PM    START taking these medications   Details  ondansetron (ZOFRAN ODT) 4 MG disintegrating tablet Take 1 tablet (4 mg total) by mouth every 8 (eight) hours as needed for nausea or vomiting., Starting Wed 09/08/2017, Print         Note:  This document was prepared using Dragon voice recognition software and may include unintentional dictation errors.     Darel Hong, MD 09/10/17 [phone]

## 2017-09-08 NOTE — Discharge Instructions (Signed)
Fortunately today your blood work, your EKG, and your chest x-ray were reassuring.  Please follow-up with your primary care physician this coming week for reevaluation and return to the emergency department sooner for any concerns.  Keep your appointment to see your cardiologist Dr. Fletcher Anon in 6 days as scheduled.  It was a pleasure to take care of you today, and thank you for coming to our emergency department.  If you have any questions or concerns before leaving please ask the nurse to grab me and I'm more than happy to go through your aftercare instructions again.  If you were prescribed any opioid pain medication today such as Norco, Vicodin, Percocet, morphine, hydrocodone, or oxycodone please make sure you do not drive when you are taking this medication as it can alter your ability to drive safely.  If you have any concerns once you are home that you are not improving or are in fact getting worse before you can make it to your follow-up appointment, please do not hesitate to call 911 and come back for further evaluation.  Darel Hong, MD  Results for orders placed or performed during the hospital encounter of 09/08/17  Comprehensive metabolic panel  Result Value Ref Range   Sodium 140 135 - 145 mmol/L   Potassium 4.0 3.5 - 5.1 mmol/L   Chloride 106 101 - 111 mmol/L   CO2 24 22 - 32 mmol/L   Glucose, Bld 100 (H) 65 - 99 mg/dL   BUN 10 6 - 20 mg/dL   Creatinine, Ser 0.68 0.44 - 1.00 mg/dL   Calcium 9.3 8.9 - 10.3 mg/dL   Total Protein 7.0 6.5 - 8.1 g/dL   Albumin 4.2 3.5 - 5.0 g/dL   AST 39 15 - 41 U/L   ALT 11 (L) 14 - 54 U/L   Alkaline Phosphatase 59 38 - 126 U/L   Total Bilirubin 1.6 (H) 0.3 - 1.2 mg/dL   GFR calc non Af Amer >60 >60 mL/min   GFR calc Af Amer >60 >60 mL/min   Anion gap 10 5 - 15  Lipase, blood  Result Value Ref Range   Lipase 35 11 - 51 U/L  CBC with Differential  Result Value Ref Range   WBC 4.3 3.6 - 11.0 K/uL   RBC 4.53 3.80 - 5.20 MIL/uL   Hemoglobin 14.5 12.0 - 16.0 g/dL   HCT 41.0 35.0 - 47.0 %   MCV 90.4 80.0 - 100.0 fL   MCH 31.9 26.0 - 34.0 pg   MCHC 35.3 32.0 - 36.0 g/dL   RDW 14.7 (H) 11.5 - 14.5 %   Platelets 103 (L) 150 - 440 K/uL   Neutrophils Relative % 67 %   Neutro Abs 2.9 1.4 - 6.5 K/uL   Lymphocytes Relative 23 %   Lymphs Abs 1.0 1.0 - 3.6 K/uL   Monocytes Relative 8 %   Monocytes Absolute 0.3 0.2 - 0.9 K/uL   Eosinophils Relative 2 %   Eosinophils Absolute 0.1 0 - 0.7 K/uL   Basophils Relative 0 %   Basophils Absolute 0.0 0 - 0.1 K/uL  Troponin I  Result Value Ref Range   Troponin I <0.03 <0.03 ng/mL   Dg Chest Port 1 View  Result Date: 09/08/2017 CLINICAL DATA:  Shortness of breath EXAM: PORTABLE CHEST 1 VIEW COMPARISON:  09/16/2015 FINDINGS: Enlarged appearance of the left ventricle, chronic. Aortic tortuosity. There is no edema, consolidation, effusion, or pneumothorax. Severe right glenohumeral osteoarthritis, likely posttraumatic. Scoliosis. IMPRESSION: No evidence  of active disease. Electronically Signed   By: Monte Fantasia M.D.   On: 09/08/2017 15:50

## 2017-09-08 NOTE — ED Triage Notes (Signed)
Per EMS pt has had nausea since walking up around 4am. Pt states this is similar to when she had a heart attack. Pt is A&O x 4 and lives at home

## 2017-09-14 ENCOUNTER — Telehealth: Payer: Self-pay | Admitting: Cardiovascular Disease

## 2017-09-14 ENCOUNTER — Ambulatory Visit (INDEPENDENT_AMBULATORY_CARE_PROVIDER_SITE_OTHER): Payer: Medicare Other

## 2017-09-14 ENCOUNTER — Other Ambulatory Visit: Payer: Self-pay

## 2017-09-14 DIAGNOSIS — I255 Ischemic cardiomyopathy: Secondary | ICD-10-CM | POA: Diagnosis not present

## 2017-09-14 NOTE — Telephone Encounter (Signed)
Patient was recently seen last week at [facility] ed nausea and chest pain and wants to make sure this visit was reviewed by Dr. Fletcher Anon.    Please call patient and or son to let them know if patient results and chart were reviewed and it is ok to wait until scheduled appt in September to fu   Patient still complaining of nausea .

## 2017-09-14 NOTE — Telephone Encounter (Signed)
No answer. No voicemail. 

## 2017-09-16 NOTE — Telephone Encounter (Signed)
S/w patient to give her echo results. Offered her an appointment to see Ignacia Bayley, NP tomorrow 09/17/17 at 09:00am. She is going to confirm with her son he can bring her and call us back if not able to come to reschedule.

## 2017-09-17 ENCOUNTER — Encounter: Payer: Self-pay | Admitting: Nurse Practitioner

## 2017-09-17 ENCOUNTER — Encounter: Payer: Self-pay | Admitting: *Deleted

## 2017-09-17 ENCOUNTER — Ambulatory Visit: Payer: Medicare Other | Admitting: Nurse Practitioner

## 2017-09-17 VITALS — BP 100/60 | HR 72 | Ht 62.0 in | Wt 113.0 lb

## 2017-09-17 DIAGNOSIS — E785 Hyperlipidemia, unspecified: Secondary | ICD-10-CM

## 2017-09-17 DIAGNOSIS — I2510 Atherosclerotic heart disease of native coronary artery without angina pectoris: Secondary | ICD-10-CM | POA: Diagnosis not present

## 2017-09-17 DIAGNOSIS — I10 Essential (primary) hypertension: Secondary | ICD-10-CM | POA: Diagnosis not present

## 2017-09-17 DIAGNOSIS — I255 Ischemic cardiomyopathy: Secondary | ICD-10-CM | POA: Diagnosis not present

## 2017-09-17 DIAGNOSIS — I25119 Atherosclerotic heart disease of native coronary artery with unspecified angina pectoris: Secondary | ICD-10-CM

## 2017-09-17 DIAGNOSIS — I5042 Chronic combined systolic (congestive) and diastolic (congestive) heart failure: Secondary | ICD-10-CM | POA: Diagnosis not present

## 2017-09-17 NOTE — Patient Instructions (Addendum)
Labwork: Pre procedure labs done today.  Testing/Procedures: See letter for procedure instructions.   Follow-Up: Your physician recommends that you schedule a follow-up appointment on September 30, 2017 at 3:00 PM with Dr. Fletcher Anon  It was a pleasure seeing you today here in the office. Please do not hesitate to give [place] a call back if you have any further questions. [place], BSN

## 2017-09-17 NOTE — Progress Notes (Signed)
Office Visit    Patient Name: Teresa Franklin Date of Encounter: 09/17/2017  Primary Care Provider:  [name], MD Primary Cardiologist:  [name], MD  Chief Complaint    84 year old female with history of CAD status post inferior MI and PCI to the [facility] in 2017, ischemic cardiomyopathy with an EF of 30 to 35%, chronic combined systolic and diastolic heart failure, hypertension, hyperlipidemia, prior stroke, and thrombocytopenia, who presents for follow-up after recent ER visit for nausea.  Past Medical History    Past Medical History:  Diagnosis Date  . Anxiety   . AV block    a. 08/2015 in setting of NSTEMI and RPAV intervention-->required temp wire but not PPM.  . Back pain   . Chronic combined systolic (congestive) and diastolic (congestive) heart failure ([place])    a. 08/2015  Echo: EF 55-60%, no rwma, mod MR, mildly dil LA/RA, mod TR, PASP 110mmHg; b. 05/2016 Echo: EF 30-35%, inflat/inf sev HK, Gr1 DD; c. 08/2017 Echo: EF 30-35%, mild LVH. Sev inf/infseptal HK. Gr1 DD. Mod MR. mildly dil LA. Mildly reduced RV fxn.  . Coronary artery disease    a. 08/2015 NSTEMI/PCI: LM nl, LAD 75p (med rx), 20 diffuse, 50d, LCX nl, OM1/2 nl, RCA 103m, RPAV 100 (2.25x12 Promus Premier DES) - complicated by 2:1 HB req temp wire post-PCI; b. 04/2016 MV: EF 34%, lateral HK/infarct. No ischemia.  . Diverticulitis   . Generalized anxiety disorder   . Hepatic steatosis   . HLD (hyperlipidemia)   . Hypertension   . Hypokalemia   . Hypomagnesemia   . Hypothyroidism   . Ischemic cardiomyopathy    a. 08/2015  Echo: EF 55-60%; b. 05/2016 Echo: EF 30-35%; c. 08/2017 Echo: EF 30-35%.  . OA (osteoarthritis)   . Osteopenia   . Sleep apnea   . Stroke [facility])    a. 08/2015 Right PCA territory infarct/temoral lobe cerebral infarction-->MRA showed severe flow liminting stenosis of R P2 cerebral artery;  b. 08/2015 Carotid U/S: 1-39% bilat ICA stenosis.  . Subdural hemorrhage ([place])    a. 05/2013 in setting of  fall.  . Thrombocytopenia ([place])   . Weakness    Past Surgical History:  Procedure Laterality Date  . CARDIAC CATHETERIZATION N/A 09/08/2015   Procedure: Temporary Pacemaker;  Surgeon: Leonie Man, MD;  Location: [place] CV LAB;  Service: Cardiovascular;  Laterality: N/A;  . CARDIAC CATHETERIZATION N/A 09/08/2015   Procedure: Left Heart Cath and Coronary Angiography;  Surgeon: Leonie Man, MD;  Location: [place] [facility];  Service: Cardiovascular;  Laterality: N/A;  . CARDIAC CATHETERIZATION N/A 09/08/2015   Procedure: Coronary Stent Intervention;  Surgeon: Leonie Man, MD;  Location: [place] [facility] LAB;  Service: Cardiovascular;  Laterality: N/A;  . CARDIAC CATHETERIZATION  09/08/2015   Procedure: Central Line Insertion;  Surgeon: Leonie Man, MD;  Location: [place] CV LAB;  Service: Cardiovascular;;  . CARDIAC CATHETERIZATION  09/08/2015   Procedure: Arterial Line Insertion;  Surgeon: Leonie Man, MD;  Location: [place] CV LAB;  Service: Cardiovascular;;  . CARDIAC CATHETERIZATION N/A 09/09/2015   Procedure: Temporary Wire;  Surgeon: Lorretta Harp, MD;  Location: [place] CV LAB;  Service: Cardiovascular;  Laterality: N/A;  . ELBOW SURGERY      Allergies  Allergies  Allergen Reactions  . Codeine Nausea And Vomiting  . Flomax [Tamsulosin] Other (See Comments)    Pt states that this medication gave her a kidney infection.    Marland Kitchen  Norco [Hydrocodone-Acetaminophen] Nausea And Vomiting  . Sulfa Antibiotics Nausea And Vomiting    History of Present Illness    84 year old female with the above complex past medical history including CAD status post myocardial infarction in June 9702 complicated by 2-1 heart block requiring temporary pacing.  The had severe disease and was stented with a drug-eluting stent.  She had residualRPAV moderate LAD disease which was medically managed.  Hospitalization was complicated by fall and stroke with imaging revealing a right  PCA territory infarct/temporal lobe cerebral infarction and MRA showing severe flow-limiting stenosis of the right P2 cerebral artery.  She was medically managed with statin and antiplatelet therapy.  In February 2018 she had exercise tolerance reduction and dyspnea on exertion.  Stress testing was undertaken and showed prior infarct without ischemia however EF was noted to be depressed.  Echocardiogram was performed in follow-up and showed new LV dysfunction with an EF of 30 to 35%.  She was medically managed in September 2018 was placed on isosorbide mononitrate therapy in the setting of exertional dyspnea and nausea, which were felt to be anginal equivalents.  She was last seen in clinic in May 2019, and had not noted any significant improvement with isosorbide therapy.  She continued to report fatigue but was swimming twice weekly at the time.  EKG showed T wave inversion in aVL, which was new.  A follow-up echocardiogram was performed and showed stable LV dysfunction with an EF of 30 to 35%.    On June 19, she was evaluated in the emergency department with epigastric discomfort and nausea that awoke her from sleep.  Troponin was normal.  ECG showed more pronounced inferior and anterolateral T wave inversions with Q waves in 3 and aVF.  She was discharged home after symptoms resolved following intravenous Zofran.  Since then, she has had almost daily episodes of nausea that seemed to be most likely to occur early in the morning, last up to about an hour and a half, and resolve spontaneously.  She has not taken any of the PRN Zofran that she was provided at discharge from the ED.  She says the symptoms are exactly like what she experienced prior to and during her heart attack.  It is interesting is that symptoms are not reproduced or worsened with water aerobics, which she performs about twice a week.  She has some degree of chronic fatigue as well as dyspnea on exertion but denies PND, orthopnea, dizziness,  syncope, edema, or early satiety.  Home Medications    Prior to Admission medications   Medication Sig Start Date End Date Taking? Authorizing Provider  acetaminophen (TYLENOL) 325 MG tablet Take 650 mg by mouth every 6 (six) hours as needed for mild pain, fever or headache.     [provider]  ALPRAZolam Duanne Moron) 0.25 MG tablet Take 1 tablet (0.25 mg total) by mouth at bedtime as needed for anxiety. 07/10/16   Gladstone Lighter, MD  aspirin 81 MG chewable tablet Chew 1 tablet (81 mg total) by mouth daily. 09/13/15   Lyda Jester M, PA-C  atorvastatin (LIPITOR) 20 MG tablet Take 1 tablet (20 mg total) by mouth daily. 09/30/15   [name], MD  calcium-vitamin D (OSCAL WITH D) 500-200 MG-UNIT tablet Take 1 tablet by mouth daily with breakfast.     [provider]  carvedilol (COREG) 6.25 MG tablet Take 1 tablet (6.25 mg total) by mouth 2 (two) times daily. 08/24/17   Rise Mu, PA-C  cetirizine (ZYRTEC) 10 MG tablet Take 10 mg by mouth daily.    [provider]  fluocinonide (LIDEX) 0.05 % external solution  10/28/15   [provider]  furosemide (LASIX) 20 MG tablet Take 20 mg by mouth daily.    [provider]  isosorbide mononitrate (IMDUR) 30 MG 24 hr tablet Take 1 tablet (30 mg total) by mouth daily. 12/11/16 05/11/17  [name], MD  levothyroxine (SYNTHROID, LEVOTHROID) 25 MCG tablet Take 25 mcg by mouth daily before breakfast.     [provider]  lisinopril (PRINIVIL,ZESTRIL) 40 MG tablet Take 40 mg by mouth daily.    [provider]  magnesium oxide (MAG-OX) 400 MG tablet Take 400 mg by mouth daily.    [provider]  meloxicam (MOBIC) 7.5 MG tablet Take 7.5 mg by mouth daily.    [provider]  Multiple Vitamins-Minerals (MULTIVITAMIN GUMMIES ADULT) CHEW Chew 2 each by mouth at bedtime.    [provider]  Multiple Vitamins-Minerals (PRESERVISION AREDS 2) CAPS Take 1 capsule by  mouth daily.    [provider]  nitroGLYCERIN (NITROSTAT) 0.4 MG SL tablet Place 1 tablet (0.4 mg total) under the tongue every 5 (five) minutes x 3 doses as needed for chest pain. 09/13/15   Lyda Jester M, PA-C  omeprazole (PRILOSEC) 20 MG capsule Take 20 mg by mouth daily before breakfast.     [provider]  ondansetron (ZOFRAN ODT) 4 MG disintegrating tablet Take 1 tablet (4 mg total) by mouth every 8 (eight) hours as needed for nausea or vomiting. 09/08/17   Darel Hong, MD  polyethylene glycol (MIRALAX / GLYCOLAX) packet Take 17 g by mouth daily as needed for mild constipation. 07/10/16   Gladstone Lighter, MD  senna-docusate (SENOKOT-S) 8.6-50 MG tablet Take 1 tablet by mouth as needed.     [provider]  sertraline (ZOLOFT) 25 MG tablet Take a half tablet daily x 1 week, then 1 po daily 11/20/16   [provider]  Simethicone 80 MG TABS Take 80 mg by mouth every 6 (six) hours as needed.    [provider]  sodium chloride (OCEAN) 0.65 % SOLN nasal spray Place 1 spray into both nostrils as needed for congestion. 09/18/15   Cristal Ford, DO   Family History  Problem Relation Age of Onset  . Hypertension Mother   . Hyperlipidemia Mother   . Stroke Mother   . Diabetes Sister   . Hypertension Sister   . Hyperlipidemia Sister   . Bipolar disorder Sister   . Hyperlipidemia Sister   . Stroke Sister   . Prostate cancer Brother   . Breast cancer Neg Hx    Social History   Socioeconomic History  . Marital status: Widowed    Spouse name: Not on file  . Number of children: Not on file  . Years of education: Not on file  . Highest education level: Not on file  Occupational History  . Not on file  Social Needs  . Financial resource strain: Not on file  . Food insecurity:    Worry: Not on file    Inability: Not on file  . Transportation needs:    Medical: Not on file    Non-medical: Not on file  Tobacco Use  . Smoking  status: Never Smoker  . Smokeless tobacco: Never Used  Substance and Sexual Activity  . Alcohol use: No    Alcohol/week: 0.0 oz  . Drug use: No  .  Sexual activity: Never  Lifestyle  . Physical activity:    Days per week: Not on file    Minutes per session: Not on file  . Stress: Not on file  Relationships  . Social connections:    Talks on phone: Not on file    Gets together: Not on file    Attends religious service: Not on file    Active member of club or organization: Not on file    Attends meetings of clubs or organizations: Not on file    Relationship status: Not on file  . Intimate partner violence:    Fear of current or ex partner: Not on file    Emotionally abused: Not on file    Physically abused: Not on file    Forced sexual activity: Not on file  Other Topics Concern  . Not on file  Social History Narrative  . Not on file   Review of Systems    Ongoing fatigue, dyspnea on exertion, and daily episodes of nausea reminiscent of prior angina.  She denies chest pain, palpitations, PND, orthopnea, dizziness, syncope, edema, or early satiety.  All other systems reviewed and are otherwise negative except as noted above.  Physical Exam    VS:  BP 100/60 (BP Location: Left Arm, Patient Position: Sitting, Cuff Size: Normal)   Pulse 72   Ht 5\' 2"  (1.575 m)   Wt 113 lb (51.3 kg)   BMI 20.67 kg/m  , BMI Body mass index is 20.67 kg/m. GEN: Well nourished, well developed, in no acute distress.  HEENT: normal.  Neck: Supple, no JVD, carotid bruits, or masses. Cardiac: RRR, no murmurs, rubs, or gallops. No clubbing, cyanosis, edema.  Radials/DP/PT 2+ and equal bilaterally.  Respiratory:  Respirations regular and unlabored, clear to auscultation bilaterally. GI: Soft, nontender, nondistended, BS + x 4. MS: no deformity or atrophy. Skin: warm and dry, no rash. Neuro:  Strength and sensation are intact. Psych: Normal affect.  Accessory Clinical Findings    ECG -regular  sinus rhythm, 72, left axis deviation which is new with suggestion of inferior infarct, LVH with lateral ST depression T wave inversion.  Assessment & Plan    1.  Coronary artery disease/unstable angina: Patient now has nearly a year history of fatigue, dyspnea, and intermittent nausea that is reminiscent of prior anginal symptoms.  She has known residual LAD disease, which was felt to be a complex lesion.  She does not think that long-acting nitrates provided any significant relief.  She was recently seen in the emergency department with recurrent nausea and says she is very concerned that this is exactly what she experienced prior to her heart attack.  ECG with more pronounced T wave changes during that ER visit the troponin was normal.  She has had similar symptoms on a daily basis.  In that setting, we had a long discussion today, including her son who is present with her.  We will plan to pursue diagnostic cardiac catheterization next week.  The patient understands that risks include but are not limited to stroke (1 in 1000), death (1 in 72), kidney failure [usually temporary] (1 in 500), bleeding (1 in 200), allergic reaction [possibly serious] (1 in 200), and agrees to proceed.  She remains on aspirin, statin, nitrate.  2.  Essential hypertension: Carvedilol dose recently increased.  Blood pressure lower today at 100/60.  It usually runs in a more normal range at home.  I will not make any changes today.  3.  Hyperlipidemia: She remains on statin therapy.   4.  Ischemic cardiomyopathy/HFrEF: Recent echo showed stable LV dysfunction with an EF of 30 to 35%.  She remains on beta-blocker and ACE inhibitor.  Pressure soft today so no room to add spironolactone.  5.  Disposition: Catheterization next week.  Follow-up in clinic in 2 weeks or sooner if necessary.  Murray Hodgkins, NP 09/17/2017, 1:27 PM

## 2017-09-17 NOTE — H&P (View-Only) (Signed)
Office Visit    Patient Name: Teresa Franklin Date of Encounter: 09/17/2017  Primary Care Provider:  [name], MD Primary Cardiologist:  [name], MD  Chief Complaint    84 year old female with history of CAD status post inferior MI and PCI to the [place] in 2017, ischemic cardiomyopathy with an EF of 30 to 35%, chronic combined systolic and diastolic heart failure, hypertension, hyperlipidemia, prior stroke, and thrombocytopenia, who presents for follow-up after recent ER visit for nausea.  Past Medical History    Past Medical History:  Diagnosis Date  . Anxiety   . AV block    a. 08/2015 in setting of NSTEMI and RPAV intervention-->required temp wire but not PPM.  . Back pain   . Chronic combined systolic (congestive) and diastolic (congestive) heart failure ([place])    a. 08/2015  Echo: EF 55-60%, no rwma, mod MR, mildly dil LA/RA, mod TR, PASP 63mmHg; b. 05/2016 Echo: EF 30-35%, inflat/inf sev HK, Gr1 DD; c. 08/2017 Echo: EF 30-35%, mild LVH. Sev inf/infseptal HK. Gr1 DD. Mod MR. mildly dil LA. Mildly reduced RV fxn.  . Coronary artery disease    a. 08/2015 NSTEMI/PCI: LM nl, LAD 75p (med rx), 20 diffuse, 50d, LCX nl, OM1/2 nl, RCA 81m, RPAV 100 (2.25x12 Promus Premier DES) - complicated by 2:1 HB req temp wire post-PCI; b. 04/2016 MV: EF 34%, lateral HK/infarct. No ischemia.  . Diverticulitis   . Generalized anxiety disorder   . Hepatic steatosis   . HLD (hyperlipidemia)   . Hypertension   . Hypokalemia   . Hypomagnesemia   . Hypothyroidism   . Ischemic cardiomyopathy    a. 08/2015  Echo: EF 55-60%; b. 05/2016 Echo: EF 30-35%; c. 08/2017 Echo: EF 30-35%.  . OA (osteoarthritis)   . Osteopenia   . Sleep apnea   . Stroke [facility])    a. 08/2015 Right PCA territory infarct/temoral lobe cerebral infarction-->MRA showed severe flow liminting stenosis of R P2 cerebral artery;  b. 08/2015 Carotid U/S: 1-39% bilat ICA stenosis.  . Subdural hemorrhage ([place])    a. 05/2013 in setting of  fall.  . Thrombocytopenia ([place])   . Weakness    Past Surgical History:  Procedure Laterality Date  . CARDIAC CATHETERIZATION N/A 09/08/2015   Procedure: Temporary Pacemaker;  Surgeon: Leonie Man, MD;  Location: [place] [facility];  Service: Cardiovascular;  Laterality: N/A;  . CARDIAC CATHETERIZATION N/A 09/08/2015   Procedure: Left Heart Cath and Coronary Angiography;  Surgeon: Leonie Man, MD;  Location: [place] [facility];  Service: Cardiovascular;  Laterality: N/A;  . CARDIAC CATHETERIZATION N/A 09/08/2015   Procedure: Coronary Stent Intervention;  Surgeon: Leonie Man, MD;  Location: [place] [facility];  Service: Cardiovascular;  Laterality: N/A;  . CARDIAC CATHETERIZATION  09/08/2015   Procedure: Central Line Insertion;  Surgeon: Leonie Man, MD;  Location: [place] [facility];  Service: Cardiovascular;;  . CARDIAC CATHETERIZATION  09/08/2015   Procedure: Arterial Line Insertion;  Surgeon: Leonie Man, MD;  Location: [place] [facility];  Service: Cardiovascular;;  . CARDIAC CATHETERIZATION N/A 09/09/2015   Procedure: Temporary Wire;  Surgeon: Lorretta Harp, MD;  Location: [place] [facility];  Service: Cardiovascular;  Laterality: N/A;  . ELBOW SURGERY      Allergies  Allergies  Allergen Reactions  . Codeine Nausea And Vomiting  . Flomax [Tamsulosin] Other (See Comments)    Pt states that this medication gave her a kidney infection.    Marland Kitchen  Norco [Hydrocodone-Acetaminophen] Nausea And Vomiting  . Sulfa Antibiotics Nausea And Vomiting    History of Present Illness    84 year old female with the above complex past medical history including CAD status post myocardial infarction in June 6767 complicated by 2-1 heart block requiring temporary pacing.  The had severe disease and was stented with a drug-eluting stent.  She had residualRPAV moderate LAD disease which was medically managed.  Hospitalization was complicated by fall and stroke with imaging revealing a right  PCA territory infarct/temporal lobe cerebral infarction and MRA showing severe flow-limiting stenosis of the right P2 cerebral artery.  She was medically managed with statin and antiplatelet therapy.  In February 2018 she had exercise tolerance reduction and dyspnea on exertion.  Stress testing was undertaken and showed prior infarct without ischemia however EF was noted to be depressed.  Echocardiogram was performed in follow-up and showed new LV dysfunction with an EF of 30 to 35%.  She was medically managed in September 2018 was placed on isosorbide mononitrate therapy in the setting of exertional dyspnea and nausea, which were felt to be anginal equivalents.  She was last seen in clinic in May 2019, and had not noted any significant improvement with isosorbide therapy.  She continued to report fatigue but was swimming twice weekly at the time.  EKG showed T wave inversion in aVL, which was new.  A follow-up echocardiogram was performed and showed stable LV dysfunction with an EF of 30 to 35%.    On June 19, she was evaluated in the emergency department with epigastric discomfort and nausea that awoke her from sleep.  Troponin was normal.  ECG showed more pronounced inferior and anterolateral T wave inversions with Q waves in 3 and aVF.  She was discharged home after symptoms resolved following intravenous Zofran.  Since then, she has had almost daily episodes of nausea that seemed to be most likely to occur early in the morning, last up to about an hour and a half, and resolve spontaneously.  She has not taken any of the PRN Zofran that she was provided at discharge from the ED.  She says the symptoms are exactly like what she experienced prior to and during her heart attack.  It is interesting is that symptoms are not reproduced or worsened with water aerobics, which she performs about twice a week.  She has some degree of chronic fatigue as well as dyspnea on exertion but denies PND, orthopnea, dizziness,  syncope, edema, or early satiety.  Home Medications    Prior to Admission medications   Medication Sig Start Date End Date Taking? Authorizing Provider  acetaminophen (TYLENOL) 325 MG tablet Take 650 mg by mouth every 6 (six) hours as needed for mild pain, fever or headache.     [provider]  ALPRAZolam Duanne Moron) 0.25 MG tablet Take 1 tablet (0.25 mg total) by mouth at bedtime as needed for anxiety. 07/10/16   Gladstone Lighter, MD  aspirin 81 MG chewable tablet Chew 1 tablet (81 mg total) by mouth daily. 09/13/15   Lyda Jester M, PA-C  atorvastatin (LIPITOR) 20 MG tablet Take 1 tablet (20 mg total) by mouth daily. 09/30/15   [name], MD  calcium-vitamin D (OSCAL WITH D) 500-200 MG-UNIT tablet Take 1 tablet by mouth daily with breakfast.     [provider]  carvedilol (COREG) 6.25 MG tablet Take 1 tablet (6.25 mg total) by mouth 2 (two) times daily. 08/24/17   Rise Mu, PA-C  cetirizine (ZYRTEC) 10 MG tablet Take 10 mg by mouth daily.    [provider]  fluocinonide (LIDEX) 0.05 % external solution  10/28/15   [provider]  furosemide (LASIX) 20 MG tablet Take 20 mg by mouth daily.    [provider]  isosorbide mononitrate (IMDUR) 30 MG 24 hr tablet Take 1 tablet (30 mg total) by mouth daily. 12/11/16 05/11/17  [name], MD  levothyroxine (SYNTHROID, LEVOTHROID) 25 MCG tablet Take 25 mcg by mouth daily before breakfast.     [provider]  lisinopril (PRINIVIL,ZESTRIL) 40 MG tablet Take 40 mg by mouth daily.    [provider]  magnesium oxide (MAG-OX) 400 MG tablet Take 400 mg by mouth daily.    [provider]  meloxicam (MOBIC) 7.5 MG tablet Take 7.5 mg by mouth daily.    [provider]  Multiple Vitamins-Minerals (MULTIVITAMIN GUMMIES ADULT) CHEW Chew 2 each by mouth at bedtime.    [provider]  Multiple Vitamins-Minerals (PRESERVISION AREDS 2) CAPS Take 1 capsule by  mouth daily.    [provider]  nitroGLYCERIN (NITROSTAT) 0.4 MG SL tablet Place 1 tablet (0.4 mg total) under the tongue every 5 (five) minutes x 3 doses as needed for chest pain. 09/13/15   Lyda Jester M, PA-C  omeprazole (PRILOSEC) 20 MG capsule Take 20 mg by mouth daily before breakfast.     [provider]  ondansetron (ZOFRAN ODT) 4 MG disintegrating tablet Take 1 tablet (4 mg total) by mouth every 8 (eight) hours as needed for nausea or vomiting. 09/08/17   Darel Hong, MD  polyethylene glycol (MIRALAX / GLYCOLAX) packet Take 17 g by mouth daily as needed for mild constipation. 07/10/16   Gladstone Lighter, MD  senna-docusate (SENOKOT-S) 8.6-50 MG tablet Take 1 tablet by mouth as needed.     [provider]  sertraline (ZOLOFT) 25 MG tablet Take a half tablet daily x 1 week, then 1 po daily 11/20/16   [provider]  Simethicone 80 MG TABS Take 80 mg by mouth every 6 (six) hours as needed.    [provider]  sodium chloride (OCEAN) 0.65 % SOLN nasal spray Place 1 spray into both nostrils as needed for congestion. 09/18/15   Cristal Ford, DO   Family History  Problem Relation Age of Onset  . Hypertension Mother   . Hyperlipidemia Mother   . Stroke Mother   . Diabetes Sister   . Hypertension Sister   . Hyperlipidemia Sister   . Bipolar disorder Sister   . Hyperlipidemia Sister   . Stroke Sister   . Prostate cancer Brother   . Breast cancer Neg Hx    Social History   Socioeconomic History  . Marital status: Widowed    Spouse name: Not on file  . Number of children: Not on file  . Years of education: Not on file  . Highest education level: Not on file  Occupational History  . Not on file  Social Needs  . Financial resource strain: Not on file  . Food insecurity:    Worry: Not on file    Inability: Not on file  . Transportation needs:    Medical: Not on file    Non-medical: Not on file  Tobacco Use  . Smoking  status: Never Smoker  . Smokeless tobacco: Never Used  Substance and Sexual Activity  . Alcohol use: No    Alcohol/week: 0.0 oz  . Drug use: No  .  Sexual activity: Never  Lifestyle  . Physical activity:    Days per week: Not on file    Minutes per session: Not on file  . Stress: Not on file  Relationships  . Social connections:    Talks on phone: Not on file    Gets together: Not on file    Attends religious service: Not on file    Active member of club or organization: Not on file    Attends meetings of clubs or organizations: Not on file    Relationship status: Not on file  . Intimate partner violence:    Fear of current or ex partner: Not on file    Emotionally abused: Not on file    Physically abused: Not on file    Forced sexual activity: Not on file  Other Topics Concern  . Not on file  Social History Narrative  . Not on file   Review of Systems    Ongoing fatigue, dyspnea on exertion, and daily episodes of nausea reminiscent of prior angina.  She denies chest pain, palpitations, PND, orthopnea, dizziness, syncope, edema, or early satiety.  All other systems reviewed and are otherwise negative except as noted above.  Physical Exam    VS:  BP 100/60 (BP Location: Left Arm, Patient Position: Sitting, Cuff Size: Normal)   Pulse 72   Ht 5\' 2"  (1.575 m)   Wt 113 lb (51.3 kg)   BMI 20.67 kg/m  , BMI Body mass index is 20.67 kg/m. GEN: Well nourished, well developed, in no acute distress.  HEENT: normal.  Neck: Supple, no JVD, carotid bruits, or masses. Cardiac: RRR, no murmurs, rubs, or gallops. No clubbing, cyanosis, edema.  Radials/DP/PT 2+ and equal bilaterally.  Respiratory:  Respirations regular and unlabored, clear to auscultation bilaterally. GI: Soft, nontender, nondistended, BS + x 4. MS: no deformity or atrophy. Skin: warm and dry, no rash. Neuro:  Strength and sensation are intact. Psych: Normal affect.  Accessory Clinical Findings    ECG -regular  sinus rhythm, 72, left axis deviation which is new with suggestion of inferior infarct, LVH with lateral ST depression T wave inversion.  Assessment & Plan    1.  Coronary artery disease/unstable angina: Patient now has nearly a year history of fatigue, dyspnea, and intermittent nausea that is reminiscent of prior anginal symptoms.  She has known residual LAD disease, which was felt to be a complex lesion.  She does not think that long-acting nitrates provided any significant relief.  She was recently seen in the emergency department with recurrent nausea and says she is very concerned that this is exactly what she experienced prior to her heart attack.  ECG with more pronounced T wave changes during that ER visit the troponin was normal.  She has had similar symptoms on a daily basis.  In that setting, we had a long discussion today, including her son who is present with her.  We will plan to pursue diagnostic cardiac catheterization next week.  The patient understands that risks include but are not limited to stroke (1 in 1000), death (1 in 74), kidney failure [usually temporary] (1 in 500), bleeding (1 in 200), allergic reaction [possibly serious] (1 in 200), and agrees to proceed.  She remains on aspirin, statin, nitrate.  2.  Essential hypertension: Carvedilol dose recently increased.  Blood pressure lower today at 100/60.  It usually runs in a more normal range at home.  I will not make any changes today.  3.  Hyperlipidemia: She remains on statin therapy.   4.  Ischemic cardiomyopathy/HFrEF: Recent echo showed stable LV dysfunction with an EF of 30 to 35%.  She remains on beta-blocker and ACE inhibitor.  Pressure soft today so no room to add spironolactone.  5.  Disposition: Catheterization next week.  Follow-up in clinic in 2 weeks or sooner if necessary.  Murray Hodgkins, NP 09/17/2017, 1:27 PM

## 2017-09-18 LAB — CBC WITH DIFFERENTIAL/PLATELET
BASOS: 0 %
Basophils Absolute: 0 10*3/uL (ref 0.0–0.2)
EOS (ABSOLUTE): 0.1 10*3/uL (ref 0.0–0.4)
Eos: 1 %
HEMOGLOBIN: 14.1 g/dL (ref 11.1–15.9)
Hematocrit: 41.8 % (ref 34.0–46.6)
IMMATURE GRANULOCYTES: 0 %
Immature Grans (Abs): 0 10*3/uL (ref 0.0–0.1)
Lymphocytes Absolute: 0.9 10*3/uL (ref 0.7–3.1)
Lymphs: 14 %
MCH: 30.9 pg (ref 26.6–33.0)
MCHC: 33.7 g/dL (ref 31.5–35.7)
MCV: 92 fL (ref 79–97)
MONOCYTES: 6 %
MONOS ABS: 0.4 10*3/uL (ref 0.1–0.9)
NEUTROS PCT: 79 %
Neutrophils Absolute: 5.1 10*3/uL (ref 1.4–7.0)
Platelets: 108 10*3/uL — ABNORMAL LOW (ref 150–450)
RBC: 4.57 x10E6/uL (ref 3.77–5.28)
RDW: 13.3 % (ref 12.3–15.4)
WBC: 6.6 10*3/uL (ref 3.4–10.8)

## 2017-09-18 LAB — COMPREHENSIVE METABOLIC PANEL
A/G RATIO: 2.2 (ref 1.2–2.2)
ALK PHOS: 64 IU/L (ref 39–117)
ALT: 12 IU/L (ref 0–32)
AST: 26 IU/L (ref 0–40)
Albumin: 4.4 g/dL (ref 3.5–4.7)
BILIRUBIN TOTAL: 0.9 mg/dL (ref 0.0–1.2)
BUN/Creatinine Ratio: 17 (ref 12–28)
BUN: 13 mg/dL (ref 8–27)
CHLORIDE: 101 mmol/L (ref 96–106)
CO2: 23 mmol/L (ref 20–29)
Calcium: 9.8 mg/dL (ref 8.7–10.3)
Creatinine, Ser: 0.76 mg/dL (ref 0.57–1.00)
GFR calc Af Amer: 83 mL/min/{1.73_m2} (ref >59)
GFR calc non Af Amer: 72 mL/min/{1.73_m2} (ref >59)
Globulin, Total: 2 g/dL (ref 1.5–4.5)
Glucose: 103 mg/dL — ABNORMAL HIGH (ref 65–99)
POTASSIUM: 3.5 mmol/L (ref 3.5–5.2)
Sodium: 142 mmol/L (ref 134–144)
Total Protein: 6.4 g/dL (ref 6.0–8.5)

## 2017-09-18 LAB — PROTIME-INR
INR: 1.1 (ref 0.8–1.2)
Prothrombin Time: 11.6 s (ref 9.1–12.0)

## 2017-09-20 ENCOUNTER — Telehealth: Payer: Self-pay | Admitting: *Deleted

## 2017-09-20 NOTE — Telephone Encounter (Signed)
Pt contacted pre-catheterization scheduled at [facility] for: Wednesday September 22, 2017 12:30 PM Verified arrival time and place: [place] Entrance A at: 10:30 AM  No solid food after midnight prior to cath, clear liquids until 5 AM day of procedure. Verified allergies in Epic Verified no diabetes medications.  Hold: Furosemide AM of procedure  Except furosemide AM meds can be  taken pre-cath with sip of water including: ASA 81 mg  Confirmed patient has responsible person to drive home post procedure and observe patient for 24 hours: yes

## 2017-09-22 ENCOUNTER — Ambulatory Visit (HOSPITAL_COMMUNITY)
Admission: RE | Admit: 2017-09-22 | Discharge: 2017-09-22 | Disposition: A | Discharge status: Home / Self Care | Payer: Medicare Other | Source: Ambulatory Visit | Attending: Cardiovascular Disease | Admitting: Cardiovascular Disease

## 2017-09-22 ENCOUNTER — Ambulatory Visit (HOSPITAL_COMMUNITY)
Admission: RE | Disposition: A | Discharge status: Home / Self Care | Payer: Self-pay | Source: Ambulatory Visit | Attending: Cardiovascular Disease

## 2017-09-22 DIAGNOSIS — I110 Hypertensive heart disease with heart failure: Secondary | ICD-10-CM | POA: Diagnosis not present

## 2017-09-22 DIAGNOSIS — M8580 Other specified disorders of bone density and structure, unspecified site: Secondary | ICD-10-CM | POA: Diagnosis not present

## 2017-09-22 DIAGNOSIS — F411 Generalized anxiety disorder: Secondary | ICD-10-CM | POA: Insufficient documentation

## 2017-09-22 DIAGNOSIS — Z8673 Personal history of transient ischemic attack (TIA), and cerebral infarction without residual deficits: Secondary | ICD-10-CM | POA: Diagnosis not present

## 2017-09-22 DIAGNOSIS — Z7982 Long term (current) use of aspirin: Secondary | ICD-10-CM | POA: Diagnosis not present

## 2017-09-22 DIAGNOSIS — Z882 Allergy status to sulfonamides status: Secondary | ICD-10-CM | POA: Diagnosis not present

## 2017-09-22 DIAGNOSIS — E785 Hyperlipidemia, unspecified: Secondary | ICD-10-CM | POA: Insufficient documentation

## 2017-09-22 DIAGNOSIS — D696 Thrombocytopenia, unspecified: Secondary | ICD-10-CM | POA: Diagnosis not present

## 2017-09-22 DIAGNOSIS — E039 Hypothyroidism, unspecified: Secondary | ICD-10-CM | POA: Insufficient documentation

## 2017-09-22 DIAGNOSIS — I25118 Atherosclerotic heart disease of native coronary artery with other forms of angina pectoris: Secondary | ICD-10-CM | POA: Diagnosis present

## 2017-09-22 DIAGNOSIS — I252 Old myocardial infarction: Secondary | ICD-10-CM | POA: Diagnosis not present

## 2017-09-22 DIAGNOSIS — I6523 Occlusion and stenosis of bilateral carotid arteries: Secondary | ICD-10-CM | POA: Diagnosis not present

## 2017-09-22 DIAGNOSIS — I5042 Chronic combined systolic (congestive) and diastolic (congestive) heart failure: Secondary | ICD-10-CM | POA: Insufficient documentation

## 2017-09-22 DIAGNOSIS — Z8249 Family history of ischemic heart disease and other diseases of the circulatory system: Secondary | ICD-10-CM | POA: Diagnosis not present

## 2017-09-22 DIAGNOSIS — G4730 Sleep apnea, unspecified: Secondary | ICD-10-CM | POA: Diagnosis not present

## 2017-09-22 DIAGNOSIS — I255 Ischemic cardiomyopathy: Secondary | ICD-10-CM | POA: Diagnosis not present

## 2017-09-22 DIAGNOSIS — I2584 Coronary atherosclerosis due to calcified coronary lesion: Secondary | ICD-10-CM | POA: Diagnosis not present

## 2017-09-22 DIAGNOSIS — M1990 Unspecified osteoarthritis, unspecified site: Secondary | ICD-10-CM | POA: Insufficient documentation

## 2017-09-22 DIAGNOSIS — I25119 Atherosclerotic heart disease of native coronary artery with unspecified angina pectoris: Secondary | ICD-10-CM | POA: Diagnosis not present

## 2017-09-22 DIAGNOSIS — Z955 Presence of coronary angioplasty implant and graft: Secondary | ICD-10-CM | POA: Diagnosis not present

## 2017-09-22 DIAGNOSIS — Z885 Allergy status to narcotic agent status: Secondary | ICD-10-CM | POA: Diagnosis not present

## 2017-09-22 HISTORY — PX: LEFT HEART CATH AND CORONARY ANGIOGRAPHY: CATH118249

## 2017-09-22 SURGERY — LEFT HEART CATH AND CORONARY ANGIOGRAPHY
Anesthesia: LOCAL

## 2017-09-22 MED ORDER — IOPAMIDOL (ISOVUE-370) INJECTION 76%
INTRAVENOUS | Status: DC | PRN
  Administered 2017-09-22: 75 mL via INTRA_ARTERIAL

## 2017-09-22 MED ORDER — SODIUM CHLORIDE 0.9% FLUSH: 3.0000 mL | Freq: Two times a day (BID) | INTRAVENOUS | Status: DC

## 2017-09-22 MED ORDER — IOPAMIDOL (ISOVUE-370) INJECTION 76%
INTRAVENOUS | Status: AC
  Filled 2017-09-22: qty 125

## 2017-09-22 MED ORDER — MIDAZOLAM HCL 2 MG/2ML IJ SOLN
INTRAMUSCULAR | Status: AC
  Filled 2017-09-22: qty 2

## 2017-09-22 MED ORDER — SODIUM CHLORIDE 0.9% FLUSH: 3.0000 mL | INTRAVENOUS | Status: DC | PRN

## 2017-09-22 MED ORDER — SODIUM CHLORIDE 0.9 % IV SOLN: 250.0000 mL | INTRAVENOUS | Status: DC | PRN

## 2017-09-22 MED ORDER — HEPARIN (PORCINE) IN NACL 2-0.9 UNITS/ML
INTRAMUSCULAR | Status: AC | PRN
  Administered 2017-09-22 (×2): 500 mL via INTRA_ARTERIAL

## 2017-09-22 MED ORDER — LIDOCAINE HCL (PF) 1 % IJ SOLN
INTRAMUSCULAR | Status: DC | PRN
  Administered 2017-09-22: 10 mL via INTRADERMAL

## 2017-09-22 MED ORDER — MIDAZOLAM HCL 2 MG/2ML IJ SOLN
INTRAMUSCULAR | Status: DC | PRN
  Administered 2017-09-22: 1 mg via INTRAVENOUS

## 2017-09-22 MED ORDER — SODIUM CHLORIDE 0.9 % IV SOLN: INTRAVENOUS | Status: DC

## 2017-09-22 MED ORDER — LIDOCAINE HCL (PF) 1 % IJ SOLN
INTRAMUSCULAR | Status: AC
  Filled 2017-09-22: qty 30

## 2017-09-22 MED ORDER — ASPIRIN 81 MG PO CHEW: 81.0000 mg | CHEWABLE_TABLET | ORAL | Status: DC

## 2017-09-22 MED ORDER — HEPARIN (PORCINE) IN NACL 1000-0.9 UT/500ML-% IV SOLN
INTRAVENOUS | Status: AC
  Filled 2017-09-22: qty 1000

## 2017-09-22 MED ORDER — SODIUM CHLORIDE 0.9 % IV SOLN
INTRAVENOUS | Status: DC
  Administered 2017-09-22: 11:00:00 via INTRAVENOUS

## 2017-09-22 MED ORDER — HYDRALAZINE HCL 20 MG/ML IJ SOLN
INTRAMUSCULAR | Status: DC | PRN
  Administered 2017-09-22 (×2): 10 mg via INTRAVENOUS

## 2017-09-22 SURGICAL SUPPLY — 8 items
CATH INFINITI 5FR MULTPACK ANG (CATHETERS) ×2 IMPLANT
DEVICE CLOSURE MYNXGRIP 5F (Vascular Products) ×2 IMPLANT
KIT HEART LEFT (KITS) ×2 IMPLANT
PACK CARDIAC CATHETERIZATION (CUSTOM PROCEDURE TRAY) ×2 IMPLANT
SHEATH PINNACLE 5F 10CM (SHEATH) ×2 IMPLANT
TRANSDUCER W/STOPCOCK (MISCELLANEOUS) ×2 IMPLANT
TUBING CIL FLEX 10 FLL-RA (TUBING) ×2 IMPLANT
WIRE EMERALD 3MM-J .035X150CM (WIRE) ×2 IMPLANT

## 2017-09-22 NOTE — Discharge Instructions (Signed)
**Note Jasani Lengel-identified via Obfuscation** Femoral Site Care °Refer to this sheet in the next few weeks. These instructions provide you with information about caring for yourself after your procedure. Your health care provider may also give you more specific instructions. Your treatment has been planned according to current medical practices, but problems sometimes occur. Call your health care provider if you have any problems or questions after your procedure. °What can I expect after the procedure? °After your procedure, it is typical to have the following: °· Bruising at the site that usually fades within 1-2 weeks. °· Blood collecting in the tissue (hematoma) that may be painful to the touch. It should usually decrease in size and tenderness within 1-2 weeks. ° °Follow these instructions at home: °· Take medicines only as directed by your health care provider. °· You may shower 24-48 hours after the procedure or as directed by your health care provider. Remove the bandage (dressing) and gently wash the site with plain soap and water. Pat the area dry with a clean towel. Do not rub the site, because this may cause bleeding. °· Do not take baths, swim, or use a hot tub until your health care provider approves. °· Check your insertion site every day for redness, swelling, or drainage. °· Do not apply powder or lotion to the site. °· Limit use of stairs to twice a day for the first 2-3 days or as directed by your health care provider. °· Do not squat for the first 2-3 days or as directed by your health care provider. °· Do not lift over 10 lb (4.5 kg) for 5 days after your procedure or as directed by your health care provider. °· Ask your health care provider when it is okay to: °? Return to work or school. °? Resume usual physical activities or sports. °? Resume sexual activity. °· Do not drive home if you are discharged the same day as the procedure. Have someone else drive you. °· You may drive 24 hours after the procedure unless otherwise instructed by  your health care provider. °· Do not operate machinery or power tools for 24 hours after the procedure or as directed by your health care provider. °· If your procedure was done as an outpatient procedure, which means that you went home the same day as your procedure, a responsible adult should be with you for the first 24 hours after you arrive home. °· Keep all follow-up visits as directed by your health care provider. This is important. °Contact a health care provider if: °· You have a fever. °· You have chills. °· You have increased bleeding from the site. Hold pressure on the site. °Get help right away if: °· You have unusual pain at the site. °· You have redness, warmth, or swelling at the site. °· You have drainage (other than a small amount of blood on the dressing) from the site. °· The site is bleeding, and the bleeding does not stop after 30 minutes of holding steady pressure on the site. °· Your leg or foot becomes pale, cool, tingly, or numb. °This information is not intended to replace advice given to you by your health care provider. Make sure you discuss any questions you have with your health care provider. °Document Released: 11/10/2013 Document Revised: 08/15/2015 Document Reviewed: 09/26/2013 °Elsevier Interactive Patient Education © 2018 [employer] ° °

## 2017-09-22 NOTE — Interval H&P Note (Signed)
Cath Lab Visit (complete for each Cath Lab visit)  Clinical Evaluation Leading to the Procedure:   ACS: No.  Non-ACS:    Anginal Classification: CCS III  Anti-ischemic medical therapy: Maximal Therapy (2 or more classes of medications)  Non-Invasive Test Results: No non-invasive testing performed  Prior CABG: No previous CABG      History and Physical Interval Note:  09/22/2017 1:02 PM  Teresa Franklin  has presented today for surgery, with the diagnosis of cad  The various methods of treatment have been discussed with the patient and family. After consideration of risks, benefits and other options for treatment, the patient has consented to  Procedure(s): LEFT HEART CATH AND CORONARY ANGIOGRAPHY (N/A) as a surgical intervention .  The patient's history has been reviewed, patient examined, no change in status, stable for surgery.  I have reviewed the patient's chart and labs.  Questions were answered to the patient's satisfaction.     Teresa Franklin

## 2017-09-24 ENCOUNTER — Encounter (HOSPITAL_COMMUNITY): Payer: Self-pay | Admitting: Cardiovascular Disease

## 2017-09-24 ENCOUNTER — Telehealth: Payer: Self-pay | Admitting: Cardiovascular Disease

## 2017-09-24 MED ORDER — FUROSEMIDE 20 MG PO TABS: 20.0000 mg | ORAL_TABLET | Freq: Every day | ORAL | 3 refills | Status: DC

## 2017-09-24 MED FILL — Heparin Sod (Porcine)-NaCl IV Soln 1000 Unit/500ML-0.9%: INTRAVENOUS | Qty: 1000 | Status: AC

## 2017-09-24 NOTE — Telephone Encounter (Signed)
Please send script for LASIX to [facility] per pt request. Pt states she is out of this medication. Pt ph [phone].

## 2017-09-24 NOTE — Telephone Encounter (Signed)
Rx for lasix 20 mg once daily sent to [facility].

## 2017-09-30 ENCOUNTER — Encounter: Payer: Self-pay | Admitting: Cardiovascular Disease

## 2017-09-30 ENCOUNTER — Ambulatory Visit: Payer: Medicare Other | Admitting: Cardiovascular Disease

## 2017-09-30 VITALS — BP 110/60 | HR 68 | Ht 62.0 in | Wt 110.8 lb

## 2017-09-30 DIAGNOSIS — I5022 Chronic systolic (congestive) heart failure: Secondary | ICD-10-CM | POA: Diagnosis not present

## 2017-09-30 DIAGNOSIS — E785 Hyperlipidemia, unspecified: Secondary | ICD-10-CM | POA: Diagnosis not present

## 2017-09-30 DIAGNOSIS — I25118 Atherosclerotic heart disease of native coronary artery with other forms of angina pectoris: Secondary | ICD-10-CM | POA: Diagnosis not present

## 2017-09-30 NOTE — Patient Instructions (Signed)
Medication Instructions: Continue same medications.   Labwork: None.   Procedures/Testing: None.   Follow-Up: 4 months with Dr. Harneet Noblett.   Any Additional Special Instructions Will Be Listed Below (If Applicable).     If you need a refill on your cardiac medications before your next appointment, please call your pharmacy.   

## 2017-09-30 NOTE — Progress Notes (Signed)
Cardiology Office Note   Date:  10/01/2017   ID:  Teresa Franklin, DOB 1930/06/11, MRN [id number]  PCP:  [name], MD  Cardiologist:   [name], MD   Chief Complaint  Patient presents with  . other    F/u cardiac cath c/o no appetitie, weight loss , pt would like to discuss nitro and has knot by incision of cardiac cath. Meds reviewed verbally with pt.      History of Present Illness: Teresa Franklin is a 84 y.o. female who presents for a follow-up visit regarding coronary artery disease. She presented in June 2017 with non-ST elevation myocardial infarction which was complicated by hypotension and high-grade AV block. Cardiac catheterization  showed an occluded right posterior AV groove artery which was treated successfully with PCI and drug-eluting stent placement. She did not require a permanent pacemaker. Ejection fraction was normal. There was significant disease affecting the proximal and mid LAD. Medical therapy was recommended considering her frail status and involvement of a large diagonal branch at the site of stenosis. She also suffered from what seems to be a stroke on June 25 which was confirmed with an MRI. However, she had no residual neurologic deficits.  She had worsening exertional dyspnea over the last 6 to 12 months.  Nuclear stress test showed no evidence of ischemia. There was evidence of prior infarct in the lateral wall with an ejection fraction of 34%. This was followed by an echocardiogram which showed an ejection fraction of 30-35% with severe hypokinesis of the inferolateral and inferior wall, moderate mitral regurgitation and no evidence of pulmonary hypertension. She was placed on Imdur but shortly after that she started experiencing severe nausea with occasional vomiting.  Nausea was the main symptom of her prior myocardial infarction. Given persistent symptoms, I proceeded with repeat cardiac catheterization last week which showed widely patent RCA  stent with no significant restenosis.  There was moderate proximal to mid LAD disease estimated to be 60% which actually appeared better than before.  LVEDP was normal.  She stopped taking isosorbide and her own and reports complete resolution of her symptoms.  She actually feels better.  Past Medical History:  Diagnosis Date  . Anxiety   . AV block    a. 08/2015 in setting of NSTEMI and RPAV intervention-->required temp wire but not PPM.  . Back pain   . Chronic combined systolic (congestive) and diastolic (congestive) heart failure ([place])    a. 08/2015  Echo: EF 55-60%, no rwma, mod MR, mildly dil LA/RA, mod TR, PASP 49mmHg; b. 05/2016 Echo: EF 30-35%, inflat/inf sev HK, Gr1 DD; c. 08/2017 Echo: EF 30-35%, mild LVH. Sev inf/infseptal HK. Gr1 DD. Mod MR. mildly dil LA. Mildly reduced RV fxn.  . Coronary artery disease    a. 08/2015 NSTEMI/PCI: LM nl, LAD 75p (med rx), 20 diffuse, 50d, LCX nl, OM1/2 nl, RCA 81m, RPAV 100 (2.25x12 Promus Premier DES) - complicated by 2:1 HB req temp wire post-PCI; b. 04/2016 MV: EF 34%, lateral HK/infarct. No ischemia.  . Diverticulitis   . Generalized anxiety disorder   . Hepatic steatosis   . HLD (hyperlipidemia)   . Hypertension   . Hypokalemia   . Hypomagnesemia   . Hypothyroidism   . Ischemic cardiomyopathy    a. 08/2015  Echo: EF 55-60%; b. 05/2016 Echo: EF 30-35%; c. 08/2017 Echo: EF 30-35%.  . OA (osteoarthritis)   . Osteopenia   . Sleep apnea   . Stroke [facility])  a. 08/2015 Right PCA territory infarct/temoral lobe cerebral infarction-->MRA showed severe flow liminting stenosis of R P2 cerebral artery;  b. 08/2015 Carotid U/S: 1-39% bilat ICA stenosis.  . Subdural hemorrhage ([place])    a. 05/2013 in setting of fall.  . Thrombocytopenia ([place])   . Weakness     Past Surgical History:  Procedure Laterality Date  . CARDIAC CATHETERIZATION N/A 09/08/2015   Procedure: Temporary Pacemaker;  Surgeon: Leonie Man, MD;  Location: [place] [facility];  Service:  Cardiovascular;  Laterality: N/A;  . CARDIAC CATHETERIZATION N/A 09/08/2015   Procedure: Left Heart Cath and Coronary Angiography;  Surgeon: Leonie Man, MD;  Location: [place] [facility];  Service: Cardiovascular;  Laterality: N/A;  . CARDIAC CATHETERIZATION N/A 09/08/2015   Procedure: Coronary Stent Intervention;  Surgeon: Leonie Man, MD;  Location: [place] [facility];  Service: Cardiovascular;  Laterality: N/A;  . CARDIAC CATHETERIZATION  09/08/2015   Procedure: Central Line Insertion;  Surgeon: Leonie Man, MD;  Location: [place] [facility];  Service: Cardiovascular;;  . CARDIAC CATHETERIZATION  09/08/2015   Procedure: Arterial Line Insertion;  Surgeon: Leonie Man, MD;  Location: [place] [facility];  Service: Cardiovascular;;  . CARDIAC CATHETERIZATION N/A 09/09/2015   Procedure: Temporary Wire;  Surgeon: Lorretta Harp, MD;  Location: [place] [facility];  Service: Cardiovascular;  Laterality: N/A;  . ELBOW SURGERY    . LEFT HEART CATH AND CORONARY ANGIOGRAPHY N/A 09/22/2017   Procedure: LEFT HEART CATH AND CORONARY ANGIOGRAPHY;  Surgeon: [name], MD;  Location: [place] [facility];  Service: Cardiovascular;  Laterality: N/A;     Current Outpatient Medications  Medication Sig Dispense Refill  . acetaminophen (TYLENOL) 325 MG tablet Take 650 mg by mouth every 6 (six) hours as needed for mild pain, fever or headache.     . ALPRAZolam (XANAX) 0.25 MG tablet Take 1 tablet (0.25 mg total) by mouth at bedtime as needed for anxiety. 15 tablet 0  . aspirin 81 MG chewable tablet Chew 1 tablet (81 mg total) by mouth daily.    Marland Kitchen atorvastatin (LIPITOR) 20 MG tablet Take 1 tablet (20 mg total) by mouth daily. 30 tablet 5  . calcium-vitamin D (OSCAL WITH D) 500-200 MG-UNIT tablet Take 1 tablet by mouth daily with breakfast.     . carvedilol (COREG) 6.25 MG tablet Take 1 tablet (6.25 mg total) by mouth 2 (two) times daily. 180 tablet 3  . cetirizine (ZYRTEC) 10 MG tablet Take 10  mg by mouth daily as needed for allergies.     . furosemide (LASIX) 20 MG tablet Take 1 tablet (20 mg total) by mouth daily. 30 tablet 3  . hydroxypropyl methylcellulose / hypromellose (ISOPTO TEARS / GONIOVISC) 2.5 % ophthalmic solution Place 1 drop into both eyes 4 (four) times daily as needed for dry eyes.    Marland Kitchen levothyroxine (SYNTHROID, LEVOTHROID) 25 MCG tablet Take 25 mcg by mouth daily before breakfast.     . lisinopril (PRINIVIL,ZESTRIL) 40 MG tablet Take 40 mg by mouth daily.    . magnesium oxide (MAG-OX) 400 MG tablet Take 400 mg by mouth daily.    . meloxicam (MOBIC) 7.5 MG tablet Take 7.5 mg by mouth daily.    . Multiple Vitamins-Minerals (MULTIVITAMIN GUMMIES ADULT) CHEW Chew 2 each by mouth at bedtime.    . Multiple Vitamins-Minerals (PRESERVISION AREDS 2) CAPS Take 1 capsule by mouth daily.    . nitroGLYCERIN (NITROSTAT) 0.4 MG SL tablet  Place 1 tablet (0.4 mg total) under the tongue every 5 (five) minutes x 3 doses as needed for chest pain. 25 tablet 2  . omeprazole (PRILOSEC) 20 MG capsule Take 20 mg by mouth daily before breakfast.     . ondansetron (ZOFRAN ODT) 4 MG disintegrating tablet Take 1 tablet (4 mg total) by mouth every 8 (eight) hours as needed for nausea or vomiting. 20 tablet 0  . polyethylene glycol (MIRALAX / GLYCOLAX) packet Take 17 g by mouth daily as needed for mild constipation. 14 each 0  . senna-docusate (SENOKOT-S) 8.6-50 MG tablet Take 1 tablet by mouth as needed.     . sertraline (ZOLOFT) 25 MG tablet Take a half tablet daily x 1 week, then 1 po daily    . Simethicone 80 MG TABS Take 80 mg by mouth daily as needed (gas).     . sodium chloride (OCEAN) 0.65 % SOLN nasal spray Place 1 spray into both nostrils as needed for congestion. 1 Bottle 0   No current facility-administered medications for this visit.     Allergies:   Codeine; Flomax [tamsulosin]; Norco [hydrocodone-acetaminophen]; and Sulfa antibiotics    Social History:  The patient  reports that  she has never smoked. She has never used smokeless tobacco. She reports that she does not drink alcohol or use drugs.   Family History:  The patient's family history includes Bipolar disorder in her sister; Diabetes in her sister; Hyperlipidemia in her mother, sister, and sister; Hypertension in her mother and sister; Prostate cancer in her brother; Stroke in her mother and sister.    ROS:  Please see the history of present illness.   Otherwise, review of systems are positive for none.   All other systems are reviewed and negative.    PHYSICAL EXAM: VS:  BP 110/60 (BP Location: Left Arm, Patient Position: Sitting, Cuff Size: Normal)   Pulse 68   Ht 5\' 2"  (1.575 m)   Wt 110 lb 12 oz (50.2 kg)   BMI 20.26 kg/m  , BMI Body mass index is 20.26 kg/m. GEN: Well nourished, well developed, in no acute distress  HEENT: normal  Neck: no JVD, carotid bruits, or masses Cardiac: RRR; 1/6 systolic ejection murmur in the aortic area, no rubs, or gallops,no edema  Respiratory:  clear to auscultation bilaterally, normal work of breathing GI: soft, nontender, nondistended, + BS MS: no deformity or atrophy  Skin: warm and dry, no rash Neuro:  Strength and sensation are intact Psych: euthymic mood, full affect Right groin is intact with no hematoma.  There is a knott likely related to the minx closure device.  EKG:  EKG is ordered today. The ekg ordered today demonstrates normal sinus rhythm with nonspecific ST and T wave changes.  Recent Labs: 09/17/2017: ALT 12; BUN 13; Creatinine, Ser 0.76; Hemoglobin 14.1; Platelets 108; Potassium 3.5; Sodium 142    Lipid Panel    Component Value Date/Time   CHOL 114 09/09/2015 0350   TRIG 111 09/09/2015 0350   HDL 38 (L) 09/09/2015 0350   CHOLHDL 3.0 09/09/2015 0350   VLDL 22 09/09/2015 0350   LDLCALC 54 09/09/2015 0350      Wt Readings from Last 3 Encounters:  09/30/17 110 lb 12 oz (50.2 kg)  09/22/17 113 lb (51.3 kg)  09/17/17 113 lb (51.3 kg)         ASSESSMENT AND PLAN:  1.  Coronary artery disease involving native coronary arteries with other forms of angina:  Recent cardiac catheterization was reassuring with patent RCA stents and moderate LAD disease.  I recommend continuing medical therapy.  GI symptoms improved significantly after stopping Imdur.  2. Chronic systolic  heart failure:   She appears to be euvolemic on current dose of furosemide 20 mg daily. Continue treatment with carvedilol and lisinopril.  3. Hyperlipidemia: Continue treatment with atorvastatin.  Most recent LDL was 54.  4. Weight loss: She was prescribed Megace recently by her primary care physician.  Disposition:   FU with me in 4 months  Signed,  [name], MD  10/01/2017 10:35 AM    [place]

## 2017-11-10 ENCOUNTER — Other Ambulatory Visit: Payer: Self-pay | Admitting: Family Medicine

## 2017-11-10 DIAGNOSIS — Z1231 Encounter for screening mammogram for malignant neoplasm of breast: Secondary | ICD-10-CM

## 2017-11-23 ENCOUNTER — Ambulatory Visit: Payer: Medicare Other | Admitting: Cardiovascular Disease

## 2017-11-23 ENCOUNTER — Telehealth: Payer: Self-pay | Admitting: Cardiovascular Disease

## 2017-11-23 NOTE — Telephone Encounter (Signed)
Patient has been made aware that she can discontinue the furosemide. She will call back if she experiences any shortness of breath or edema or if her fatigue does not get better.

## 2017-11-23 NOTE — Telephone Encounter (Signed)
Her LV pressure was normal during cardiac catheterization thus furosemide can be stopped for now.  Monitor symptoms.

## 2017-11-23 NOTE — Telephone Encounter (Signed)
Pt c/o BP issue: STAT if pt c/o blurred vision, one-sided weakness or slurred speech  1. What are your last 5 BP readings?  128/74 on 8/29 114/72 on 8/30   2. Are you having any other symptoms (ex. Dizziness, headache, blurred vision, passed out)?  Some dizziness and fatigue - slight nausea in the morning   3. What is your BP issue? BP has been doing better, Dr. Hoy Morn, patient's PCP has been lowering BP medication and would like to know what Dr. Fletcher Anon thinks of this - patient has an appointment with Dr. Hoy Morn on 9/6  Please call to discuss

## 2017-11-23 NOTE — Telephone Encounter (Signed)
Returned the call to the patient. She stated that she had a follow up scheduled today that was cancelled with Dr. Fletcher Anon. She wanted to discuss her recent fatigue and medication changes by her PCP Dr. Hoy Morn.  Per the patient, her Lisinopril has been decreased to 20 mg daily and her Carvedilol has been decreased to 3.125 mg bid.   She also wanted to discuss whether or not she need the Furosemide (currently 20 mg daily)or if it could be reduced. She denies shortness of breath, weight gain or edema. She stated she has been losing weight lately due to the fatigue and she wonders if it is from the Furosemide. She has been advised that she would probably need to be seen or may need some lab work done. She was offered an appointment on Friday but she stated that she will be seeing her PCP that day.   Recent blood pressures after the decrease in the medication:  128/74 on 8/29 114/72 on 8/30

## 2017-11-26 ENCOUNTER — Other Ambulatory Visit: Payer: Self-pay | Admitting: Family Medicine

## 2017-11-26 DIAGNOSIS — Z780 Asymptomatic menopausal state: Secondary | ICD-10-CM

## 2017-12-01 ENCOUNTER — Ambulatory Visit
Admission: RE | Admit: 2017-12-01 | Discharge: 2017-12-01 | Disposition: A | Discharge status: Home / Self Care | Payer: Medicare Other | Source: Ambulatory Visit | Attending: Family Medicine | Admitting: Family Medicine

## 2017-12-01 DIAGNOSIS — Z1231 Encounter for screening mammogram for malignant neoplasm of breast: Secondary | ICD-10-CM | POA: Diagnosis not present

## 2017-12-14 ENCOUNTER — Ambulatory Visit
Admission: RE | Admit: 2017-12-14 | Discharge: 2017-12-14 | Disposition: A | Discharge status: Home / Self Care | Payer: Medicare Other | Source: Ambulatory Visit | Attending: Family Medicine | Admitting: Family Medicine

## 2017-12-14 DIAGNOSIS — Z780 Asymptomatic menopausal state: Secondary | ICD-10-CM | POA: Diagnosis present

## 2018-02-10 ENCOUNTER — Encounter: Payer: Self-pay | Admitting: Cardiovascular Disease

## 2018-02-10 ENCOUNTER — Ambulatory Visit: Payer: Medicare Other | Admitting: Cardiovascular Disease

## 2018-02-10 VITALS — BP 142/80 | HR 86 | Ht 62.0 in | Wt 113.5 lb

## 2018-02-10 DIAGNOSIS — I10 Essential (primary) hypertension: Secondary | ICD-10-CM

## 2018-02-10 DIAGNOSIS — E785 Hyperlipidemia, unspecified: Secondary | ICD-10-CM

## 2018-02-10 DIAGNOSIS — I25118 Atherosclerotic heart disease of native coronary artery with other forms of angina pectoris: Secondary | ICD-10-CM | POA: Diagnosis not present

## 2018-02-10 DIAGNOSIS — I5022 Chronic systolic (congestive) heart failure: Secondary | ICD-10-CM

## 2018-02-10 NOTE — Progress Notes (Signed)
Cardiology Office Note   Date:  02/10/2018   ID:  Teresa Franklin, DOB 01/29/1931, MRN [id number]  PCP:  [name], MD  Cardiologist:   [name], MD   Chief Complaint  Patient presents with  . other    4 mo  f/u. SOB  and fatigue improved with medication changes by PCP.Medications reviewed verbally.      History of Present Illness: Teresa Franklin is a 84 y.o. female who presents for a follow-up visit regarding coronary artery disease. She presented in June 2017 with non-ST elevation myocardial infarction which was complicated by hypotension and high-grade AV block. Cardiac catheterization showed an occluded right posterior AV groove artery which was treated successfully with PCI and drug-eluting stent placement. She did not require a permanent pacemaker. Ejection fraction was normal. There was significant disease affecting the proximal and mid LAD. Medical therapy was recommended considering her frail status and involvement of a large diagonal branch at the site of stenosis. She also suffered from what seems to be a stroke on June 25 which was confirmed with an MRI. However, she had no residual neurologic deficits.  She had worsening exertional dyspnea in 2018.    Nuclear stress test showed no evidence of ischemia. There was evidence of prior infarct in the lateral wall with an ejection fraction of 34%. This was followed by an echocardiogram which showed an ejection fraction of 30-35% with severe hypokinesis of the inferolateral and inferior wall, moderate mitral regurgitation and no evidence of pulmonary hypertension. Left heart catheterization was done in July 2019 which showed widely patent RCA stent with no significant restenosis.  There was moderate proximal to mid LAD disease estimated to be 60% which actually appeared better than before.  LVEDP was normal. Furosemide was discontinued few months ago with no evidence of volume overload.  The dose of carvedilol was decreased in  half by her primary care physician due to low blood pressure.  Over the last few months, she has been feeling extremely well with resolution of fatigue and shortness of breath.  She goes to a water aerobics class twice a week and she stays active.  She has no exertional symptoms.   Past Medical History:  Diagnosis Date  . Anxiety   . AV block    a. 08/2015 in setting of NSTEMI and RPAV intervention-->required temp wire but not PPM.  . Back pain   . Chronic combined systolic (congestive) and diastolic (congestive) heart failure ([place])    a. 08/2015  Echo: EF 55-60%, no rwma, mod MR, mildly dil LA/RA, mod TR, PASP 60mmHg; b. 05/2016 Echo: EF 30-35%, inflat/inf sev HK, Gr1 DD; c. 08/2017 Echo: EF 30-35%, mild LVH. Sev inf/infseptal HK. Gr1 DD. Mod MR. mildly dil LA. Mildly reduced RV fxn.  . Coronary artery disease    a. 08/2015 NSTEMI/PCI: LM nl, LAD 75p (med rx), 20 diffuse, 50d, LCX nl, OM1/2 nl, RCA 67m, RPAV 100 (2.25x12 Promus Premier DES) - complicated by 2:1 HB req temp wire post-PCI; b. 04/2016 MV: EF 34%, lateral HK/infarct. No ischemia.  . Diverticulitis   . Generalized anxiety disorder   . Hepatic steatosis   . HLD (hyperlipidemia)   . Hypertension   . Hypokalemia   . Hypomagnesemia   . Hypothyroidism   . Ischemic cardiomyopathy    a. 08/2015  Echo: EF 55-60%; b. 05/2016 Echo: EF 30-35%; c. 08/2017 Echo: EF 30-35%.  . OA (osteoarthritis)   . Osteopenia   . Sleep apnea   .  Stroke [facility])    a. 08/2015 Right PCA territory infarct/temoral lobe cerebral infarction-->MRA showed severe flow liminting stenosis of R P2 cerebral artery;  b. 08/2015 Carotid U/S: 1-39% bilat ICA stenosis.  . Subdural hemorrhage ([place])    a. 05/2013 in setting of fall.  . Thrombocytopenia ([place])   . Weakness     Past Surgical History:  Procedure Laterality Date  . CARDIAC CATHETERIZATION N/A 09/08/2015   Procedure: Temporary Pacemaker;  Surgeon: Leonie Man, MD;  Location: [facility];  Service:  Cardiovascular;  Laterality: N/A;  . CARDIAC CATHETERIZATION N/A 09/08/2015   Procedure: Left Heart Cath and Coronary Angiography;  Surgeon: Leonie Man, MD;  Location: [place] [facility];  Service: Cardiovascular;  Laterality: N/A;  . CARDIAC CATHETERIZATION N/A 09/08/2015   Procedure: Coronary Stent Intervention;  Surgeon: Leonie Man, MD;  Location: [place] [facility];  Service: Cardiovascular;  Laterality: N/A;  . CARDIAC CATHETERIZATION  09/08/2015   Procedure: Central Line Insertion;  Surgeon: Leonie Man, MD;  Location: [place] [facility];  Service: Cardiovascular;;  . CARDIAC CATHETERIZATION  09/08/2015   Procedure: Arterial Line Insertion;  Surgeon: Leonie Man, MD;  Location: [place] [facility];  Service: Cardiovascular;;  . CARDIAC CATHETERIZATION N/A 09/09/2015   Procedure: Temporary Wire;  Surgeon: Lorretta Harp, MD;  Location: [place] [facility];  Service: Cardiovascular;  Laterality: N/A;  . ELBOW SURGERY    . LEFT HEART CATH AND CORONARY ANGIOGRAPHY N/A 09/22/2017   Procedure: LEFT HEART CATH AND CORONARY ANGIOGRAPHY;  Surgeon: [name], MD;  Location: [place] [facility];  Service: Cardiovascular;  Laterality: N/A;     Current Outpatient Medications  Medication Sig Dispense Refill  . acetaminophen (TYLENOL) 325 MG tablet Take 650 mg by mouth every 6 (six) hours as needed for mild pain, fever or headache.     . ALPRAZolam (XANAX) 0.25 MG tablet Take 1 tablet (0.25 mg total) by mouth at bedtime as needed for anxiety. 15 tablet 0  . aspirin 81 MG chewable tablet Chew 1 tablet (81 mg total) by mouth daily.    Marland Kitchen atorvastatin (LIPITOR) 20 MG tablet Take 1 tablet (20 mg total) by mouth daily. 30 tablet 5  . calcium-vitamin D (OSCAL WITH D) 500-200 MG-UNIT tablet Take 1 tablet by mouth daily with breakfast.     . carvedilol (COREG) 6.25 MG tablet Take 1 tablet (6.25 mg total) by mouth 2 (two) times daily. (Patient taking differently: Take 3.125 mg by mouth 2 (two)  times daily. ) 180 tablet 3  . hydroxypropyl methylcellulose / hypromellose (ISOPTO TEARS / GONIOVISC) 2.5 % ophthalmic solution Place 1 drop into both eyes 4 (four) times daily as needed for dry eyes.    Marland Kitchen levothyroxine (SYNTHROID, LEVOTHROID) 25 MCG tablet Take 25 mcg by mouth daily before breakfast.     . lisinopril (PRINIVIL,ZESTRIL) 40 MG tablet Take 20 mg by mouth daily.     . magnesium oxide (MAG-OX) 400 MG tablet Take 400 mg by mouth daily.    . Multiple Vitamins-Minerals (MULTIVITAMIN GUMMIES ADULT) CHEW Chew 2 each by mouth at bedtime.    . Multiple Vitamins-Minerals (PRESERVISION AREDS 2) CAPS Take 1 capsule by mouth daily.    Marland Kitchen omeprazole (PRILOSEC) 20 MG capsule Take 20 mg by mouth daily before breakfast.     . sertraline (ZOLOFT) 25 MG tablet Take a half tablet daily x 1 week, then 1 po daily    . Simethicone 80  MG TABS Take 80 mg by mouth daily as needed (gas).     . sodium chloride (OCEAN) 0.65 % SOLN nasal spray Place 1 spray into both nostrils as needed for congestion. 1 Bottle 0  . meloxicam (MOBIC) 7.5 MG tablet Take 7.5 mg by mouth daily.    . nitroGLYCERIN (NITROSTAT) 0.4 MG SL tablet Place 1 tablet (0.4 mg total) under the tongue every 5 (five) minutes x 3 doses as needed for chest pain. (Patient not taking: Reported on 02/10/2018) 25 tablet 2  . polyethylene glycol (MIRALAX / GLYCOLAX) packet Take 17 g by mouth daily as needed for mild constipation. (Patient not taking: Reported on 02/10/2018) 14 each 0  . senna-docusate (SENOKOT-S) 8.6-50 MG tablet Take 1 tablet by mouth as needed.      No current facility-administered medications for this visit.     Allergies:   Codeine; Flomax [tamsulosin]; Norco [hydrocodone-acetaminophen]; and Sulfa antibiotics    Social History:  The patient  reports that she has never smoked. She has never used smokeless tobacco. She reports that she does not drink alcohol or use drugs.   Family History:  The patient's family history includes  Bipolar disorder in her sister; Diabetes in her sister; Hyperlipidemia in her mother, sister, and sister; Hypertension in her mother and sister; Prostate cancer in her brother; Stroke in her mother and sister.    ROS:  Please see the history of present illness.   Otherwise, review of systems are positive for none.   All other systems are reviewed and negative.    PHYSICAL EXAM: VS:  BP (!) 142/80 (BP Location: Left Arm, Patient Position: Sitting, Cuff Size: Normal)   Pulse 86   Ht 5\' 2"  (1.575 m)   Wt 113 lb 8 oz (51.5 kg)   BMI 20.76 kg/m  , BMI Body mass index is 20.76 kg/m. GEN: Well nourished, well developed, in no acute distress  HEENT: normal  Neck: no JVD, carotid bruits, or masses Cardiac: RRR; 1/6 systolic ejection murmur in the aortic area, no rubs, or gallops,no edema  Respiratory:  clear to auscultation bilaterally, normal work of breathing GI: soft, nontender, nondistended, + BS MS: no deformity or atrophy  Skin: warm and dry, no rash Neuro:  Strength and sensation are intact Psych: euthymic mood, full affect   EKG:  EKG is ordered today. The ekg ordered today demonstrates normal sinus rhythm with sinus arrhythmia.  Nonspecific T wave changes.  Recent Labs: 09/17/2017: ALT 12; BUN 13; Creatinine, Ser 0.76; Hemoglobin 14.1; Platelets 108; Potassium 3.5; Sodium 142    Lipid Panel    Component Value Date/Time   CHOL 114 09/09/2015 0350   TRIG 111 09/09/2015 0350   HDL 38 (L) 09/09/2015 0350   CHOLHDL 3.0 09/09/2015 0350   VLDL 22 09/09/2015 0350   LDLCALC 54 09/09/2015 0350      Wt Readings from Last 3 Encounters:  02/10/18 113 lb 8 oz (51.5 kg)  09/30/17 110 lb 12 oz (50.2 kg)  09/22/17 113 lb (51.3 kg)       ASSESSMENT AND PLAN:  1.  Coronary artery disease involving native coronary arteries with other forms of angina:    She is currently doing very well with no recurrent anginal symptoms.  Continue medical therapy.  2. Chronic systolic  heart  failure:   She appears to be euvolemic without furosemide. Continue treatment with carvedilol and lisinopril.  3. Hyperlipidemia: Continue treatment with atorvastatin.  Most recent LDL was 77 which is  close to target.  4.  Essential hypertension: Blood pressure is mildly elevated today but I elected not to make any changes given recent episodes of hypotension.  Disposition:   FU with me in 6 months  Signed,  [name], MD  02/10/2018 11:10 AM    [place]

## 2018-02-10 NOTE — Patient Instructions (Signed)
Medication Instructions:  No changes  If you need a refill on your cardiac medications before your next appointment, please call your pharmacy.   Lab work: None ordered  Testing/Procedures: None ordered  Follow-Up: Your physician recommends that you schedule a follow-up appointment in: 6 months with Dr. Fletcher Anon

## 2018-03-08 ENCOUNTER — Inpatient Hospital Stay: Payer: Medicare Other | Attending: Internal Medicine | Admitting: Internal Medicine

## 2018-03-08 ENCOUNTER — Inpatient Hospital Stay: Payer: Medicare Other

## 2018-03-08 VITALS — BP 159/77 | HR 81 | Temp 97.3°F | Wt 116.7 lb

## 2018-03-08 DIAGNOSIS — Z8673 Personal history of transient ischemic attack (TIA), and cerebral infarction without residual deficits: Secondary | ICD-10-CM | POA: Diagnosis not present

## 2018-03-08 DIAGNOSIS — D696 Thrombocytopenia, unspecified: Secondary | ICD-10-CM

## 2018-03-08 DIAGNOSIS — I5042 Chronic combined systolic (congestive) and diastolic (congestive) heart failure: Secondary | ICD-10-CM | POA: Diagnosis not present

## 2018-03-08 DIAGNOSIS — E039 Hypothyroidism, unspecified: Secondary | ICD-10-CM | POA: Insufficient documentation

## 2018-03-08 DIAGNOSIS — I110 Hypertensive heart disease with heart failure: Secondary | ICD-10-CM | POA: Diagnosis not present

## 2018-03-08 DIAGNOSIS — Z7982 Long term (current) use of aspirin: Secondary | ICD-10-CM | POA: Insufficient documentation

## 2018-03-08 DIAGNOSIS — F419 Anxiety disorder, unspecified: Secondary | ICD-10-CM

## 2018-03-08 DIAGNOSIS — I2510 Atherosclerotic heart disease of native coronary artery without angina pectoris: Secondary | ICD-10-CM

## 2018-03-08 DIAGNOSIS — Z79899 Other long term (current) drug therapy: Secondary | ICD-10-CM | POA: Diagnosis not present

## 2018-03-08 LAB — CBC WITH DIFFERENTIAL/PLATELET
Abs Immature Granulocytes: 0.01 10*3/uL (ref 0.00–0.07)
BASOS PCT: 1 %
Basophils Absolute: 0 10*3/uL (ref 0.0–0.1)
EOS ABS: 0.1 10*3/uL (ref 0.0–0.5)
EOS PCT: 2 %
HCT: 41.7 % (ref 36.0–46.0)
Hemoglobin: 14 g/dL (ref 12.0–15.0)
Immature Granulocytes: 0 %
Lymphocytes Relative: 25 %
Lymphs Abs: 1.2 10*3/uL (ref 0.7–4.0)
MCH: 32.3 pg (ref 26.0–34.0)
MCHC: 33.6 g/dL (ref 30.0–36.0)
MCV: 96.1 fL (ref 80.0–100.0)
MONO ABS: 0.3 10*3/uL (ref 0.1–1.0)
Monocytes Relative: 7 %
Neutro Abs: 3.1 10*3/uL (ref 1.7–7.7)
Neutrophils Relative %: 65 %
PLATELETS: 101 10*3/uL — AB (ref 150–400)
RBC: 4.34 MIL/uL (ref 3.87–5.11)
RDW: 13.4 % (ref 11.5–15.5)
WBC: 4.8 10*3/uL (ref 4.0–10.5)
nRBC: 0 % (ref 0.0–0.2)

## 2018-03-08 NOTE — Progress Notes (Signed)
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 GIB [colonoscopy- divertciulosis [facility] 2012]   No history exists.     HISTORY OF PRESENTING ILLNESS:  Teresa Franklin 84 y.o.  female chronic thrombocytopenia currently under surveillance is here for follow-up.  Patient denies any gum bleeding joint bleeding.  Admits to easy bruising.  Patient is on antiplatelet therapy.  No recent falls or trauma.  Review of Systems  Constitutional: Positive for malaise/fatigue. Negative for chills, diaphoresis, fever and weight loss.  HENT: Negative for nosebleeds and sore throat.   Eyes: Negative for double vision.  Respiratory: Negative for cough, hemoptysis, sputum production, shortness of breath and wheezing.   Cardiovascular: Negative for chest pain, palpitations, orthopnea and leg swelling.  Gastrointestinal: Negative for abdominal pain, blood in stool, constipation, diarrhea, heartburn, melena, nausea and vomiting.  Genitourinary: Negative for dysuria, frequency and urgency.  Musculoskeletal: Positive for back pain and joint pain.  Skin: Negative.  Negative for itching and rash.  Neurological: Negative for dizziness, tingling, focal weakness, weakness and headaches.  Endo/Heme/Allergies: Does not bruise/bleed easily.  Psychiatric/Behavioral: Negative for depression. The patient is not nervous/anxious and does not have insomnia.      MEDICAL HISTORY:  Past Medical History:  Diagnosis Date  . Anxiety   . AV block    a. 08/2015 in setting of NSTEMI and RPAV intervention-->required temp wire but not PPM.  . Back pain   . Chronic combined systolic (congestive) and diastolic  (congestive) heart failure ([place])    a. 08/2015  Echo: EF 55-60%, no rwma, mod MR, mildly dil LA/RA, mod TR, PASP 72mmHg; b. 05/2016 Echo: EF 30-35%, inflat/inf sev HK, Gr1 DD; c. 08/2017 Echo: EF 30-35%, mild LVH. Sev inf/infseptal HK. Gr1 DD. Mod MR. mildly dil LA. Mildly reduced RV fxn.  . Coronary artery disease    a. 08/2015 NSTEMI/PCI: LM nl, LAD 75p (med rx), 20 diffuse, 50d, LCX nl, OM1/2 nl, RCA 109m, RPAV 100 (2.25x12 Promus Premier DES) - complicated by 2:1 HB req temp wire post-PCI; b. 04/2016 MV: EF 34%, lateral HK/infarct. No ischemia.  . Diverticulitis   . Generalized anxiety disorder   . Hepatic steatosis   . HLD (hyperlipidemia)   . Hypertension   . Hypokalemia   . Hypomagnesemia   . Hypothyroidism   . Ischemic cardiomyopathy    a. 08/2015  Echo: EF 55-60%; b. 05/2016 Echo: EF 30-35%; c. 08/2017 Echo: EF 30-35%.  . OA (osteoarthritis)   . Osteopenia   . Sleep apnea   . Stroke [facility])    a. 08/2015 Right PCA territory infarct/temoral lobe cerebral infarction-->MRA showed severe flow liminting stenosis of R P2 cerebral artery;  b. 08/2015 Carotid U/S: 1-39% bilat ICA stenosis.  . Subdural hemorrhage ([place])    a. 05/2013 in setting of fall.  . Thrombocytopenia ([place])   . Weakness     SURGICAL HISTORY: Past Surgical History:  Procedure Laterality Date  . CARDIAC CATHETERIZATION N/A 09/08/2015   Procedure: Temporary Pacemaker;  Surgeon: Leonie Man, MD;  Location: [place] CV [facility];  Service: Cardiovascular;  Laterality: N/A;  . CARDIAC CATHETERIZATION N/A 09/08/2015   Procedure: Left Heart Cath and Coronary Angiography;  Surgeon:  Leonie Man, MD;  Location: [place] [facility];  Service: Cardiovascular;  Laterality: N/A;  . CARDIAC CATHETERIZATION N/A 09/08/2015   Procedure: Coronary Stent Intervention;  Surgeon: Leonie Man, MD;  Location: [place] [facility];  Service: Cardiovascular;  Laterality: N/A;  . CARDIAC CATHETERIZATION  09/08/2015   Procedure: Central Line Insertion;   Surgeon: Leonie Man, MD;  Location: [place] [facility] LAB;  Service: Cardiovascular;;  . CARDIAC CATHETERIZATION  09/08/2015   Procedure: Arterial Line Insertion;  Surgeon: Leonie Man, MD;  Location: [place] [facility] LAB;  Service: Cardiovascular;;  . CARDIAC CATHETERIZATION N/A 09/09/2015   Procedure: Temporary Wire;  Surgeon: Lorretta Harp, MD;  Location: [place] CV LAB;  Service: Cardiovascular;  Laterality: N/A;  . ELBOW SURGERY    . LEFT HEART CATH AND CORONARY ANGIOGRAPHY N/A 09/22/2017   Procedure: LEFT HEART CATH AND CORONARY ANGIOGRAPHY;  Surgeon: [name], MD;  Location: [place] CV LAB;  Service: Cardiovascular;  Laterality: N/A;    SOCIAL HISTORY: worked until until Tax adviser- retd; no smoking/alcohol/ lives with husband in [place].  Social History   Socioeconomic History  . Marital status: Widowed    Spouse name: Not on file  . Number of children: Not on file  . Years of education: Not on file  . Highest education level: Not on file  Occupational History  . Not on file  Social Needs  . Financial resource strain: Not on file  . Food insecurity:    Worry: Not on file    Inability: Not on file  . Transportation needs:    Medical: Not on file    Non-medical: Not on file  Tobacco Use  . Smoking status: Never Smoker  . Smokeless tobacco: Never Used  Substance and Sexual Activity  . Alcohol use: No    Alcohol/week: 0.0 standard drinks  . Drug use: No  . Sexual activity: Never  Lifestyle  . Physical activity:    Days per week: Not on file    Minutes per session: Not on file  . Stress: Not on file  Relationships  . Social connections:    Talks on phone: Not on file    Gets together: Not on file    Attends religious service: Not on file    Active member of club or organization: Not on file    Attends meetings of clubs or organizations: Not on file    Relationship status: Not on file  . Intimate partner violence:    Fear of current or ex  partner: Not on file    Emotionally abused: Not on file    Physically abused: Not on file    Forced sexual activity: Not on file  Other Topics Concern  . Not on file  Social History Narrative  . Not on file    FAMILY HISTORY: Family History  Problem Relation Age of Onset  . Hypertension Mother   . Hyperlipidemia Mother   . Stroke Mother   . Diabetes Sister   . Hypertension Sister   . Hyperlipidemia Sister   . Bipolar disorder Sister   . Hyperlipidemia Sister   . Stroke Sister   . Prostate cancer Brother   . Breast cancer Neg Hx     ALLERGIES:  is allergic to codeine; flomax [tamsulosin]; norco [hydrocodone-acetaminophen]; and sulfa antibiotics.  MEDICATIONS:  Current Outpatient Medications  Medication Sig Dispense Refill  . acetaminophen (TYLENOL) 325 MG tablet Take 650 mg by mouth every 6 (  six) hours as needed for mild pain, fever or headache.     . ALPRAZolam (XANAX) 0.25 MG tablet Take 1 tablet (0.25 mg total) by mouth at bedtime as needed for anxiety. 15 tablet 0  . aspirin 81 MG chewable tablet Chew 1 tablet (81 mg total) by mouth daily.    Marland Kitchen atorvastatin (LIPITOR) 20 MG tablet Take 1 tablet (20 mg total) by mouth daily. 30 tablet 5  . calcium-vitamin D (OSCAL WITH D) 500-200 MG-UNIT tablet Take 1 tablet by mouth daily with breakfast.     . carvedilol (COREG) 6.25 MG tablet Take 1 tablet (6.25 mg total) by mouth 2 (two) times daily. (Patient taking differently: Take 3.125 mg by mouth 2 (two) times daily. ) 180 tablet 3  . hydroxypropyl methylcellulose / hypromellose (ISOPTO TEARS / GONIOVISC) 2.5 % ophthalmic solution Place 1 drop into both eyes 4 (four) times daily as needed for dry eyes.    Marland Kitchen levothyroxine (SYNTHROID, LEVOTHROID) 25 MCG tablet Take 25 mcg by mouth daily before breakfast.     . lisinopril (PRINIVIL,ZESTRIL) 40 MG tablet Take 20 mg by mouth daily.     . magnesium oxide (MAG-OX) 400 MG tablet Take 400 mg by mouth daily.    . meloxicam (MOBIC) 7.5 MG  tablet Take 7.5 mg by mouth daily.    . Multiple Vitamins-Minerals (MULTIVITAMIN GUMMIES ADULT) CHEW Chew 2 each by mouth at bedtime.    . Multiple Vitamins-Minerals (PRESERVISION AREDS 2) CAPS Take 1 capsule by mouth daily.    . nitroGLYCERIN (NITROSTAT) 0.4 MG SL tablet Place 1 tablet (0.4 mg total) under the tongue every 5 (five) minutes x 3 doses as needed for chest pain. 25 tablet 2  . omeprazole (PRILOSEC) 20 MG capsule Take 20 mg by mouth daily before breakfast.     . polyethylene glycol (MIRALAX / GLYCOLAX) packet Take 17 g by mouth daily as needed for mild constipation. 14 each 0  . senna-docusate (SENOKOT-S) 8.6-50 MG tablet Take 1 tablet by mouth as needed.     . sertraline (ZOLOFT) 25 MG tablet Take a half tablet daily x 1 week, then 1 po daily    . Simethicone 80 MG TABS Take 80 mg by mouth daily as needed (gas).     . sodium chloride (OCEAN) 0.65 % SOLN nasal spray Place 1 spray into both nostrils as needed for congestion. 1 Bottle 0   No current facility-administered medications for this visit.       PHYSICAL EXAMINATION:   Vitals:   03/08/18 1559  BP: (!) 159/77  Pulse: 81  Temp: (!) 97.3 F (36.3 C)   Filed Weights   03/08/18 1559  Weight: 116 lb 11.7 oz (53 kg)    Physical Exam  Constitutional: She is oriented to person, place, and time and well-developed, well-nourished, and in no distress.  HENT:  Head: Normocephalic and atraumatic.  Mouth/Throat: Oropharynx is clear and moist. No oropharyngeal exudate.  Eyes: Pupils are equal, round, and reactive to light.  Neck: Normal range of motion. Neck supple.  Cardiovascular: Normal rate and regular rhythm.  Pulmonary/Chest: No respiratory distress. She has no wheezes.  Abdominal: Soft. Bowel sounds are normal. She exhibits no distension and no mass. There is no abdominal tenderness. There is no rebound and no guarding.  Musculoskeletal: Normal range of motion.        General: No tenderness or edema.   Neurological: She is alert and oriented to person, place, and time.  Skin: Skin is  warm.  Psychiatric: Affect normal.    LABORATORY DATA:  I have reviewed the data as listed Lab Results  Component Value Date   WBC 4.8 03/08/2018   HGB 14.0 03/08/2018   HCT 41.7 03/08/2018   MCV 96.1 03/08/2018   PLT 101 (L) 03/08/2018   Recent Labs    09/07/17 1337 09/08/17 1543 09/17/17 1039  NA 142 140 142  K 4.8 4.0 3.5  CL 102 106 101  CO2 29 24 23   GLUCOSE 104* 100* 103*  BUN 15 10 13   CREATININE 0.83 0.68 0.76  CALCIUM 9.8 9.3 9.8  GFRNONAA >60 >60 72  GFRAA >60 >60 83  PROT 7.0 7.0 6.4  ALBUMIN 4.2 4.2 4.4  AST 30 39 26  ALT 15 11* 12  ALKPHOS 64 59 64  BILITOT 0.6 1.6* 0.9    RADIOGRAPHIC STUDIES: I have personally reviewed the radiological images as listed and agreed with the findings in the report. No results found.  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   Most recent platelet count is 101.  Hemoglobin and white count normal.  Again reviewed the natural history and treatment threshold of generally above 50-75000 [given history of falls].  Continue surveillance without any active treatment.    #History of CAD  S/p stenting on aspirin.  Not on Plavix-secondary to history of falls.  #Discussed regarding follow-up at the [facility] cancer center with Dr. Mike Gip; patient declines.  She was to follow-up with PCP.  She will call us if she is having any bleeding issues.  # DISPOSITION:  #No follow-up/wants to follow with PCP  Cc; Dr.Grandis.    All questions were answered. The patient knows to call the clinic with any problems, questions or concerns.    Cammie Sickle, MD 03/09/2018 8:36 AM

## 2018-03-08 NOTE — Assessment & Plan Note (Addendum)
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   Most recent platelet count is 101.  Hemoglobin and white count normal.  Again reviewed the natural history and treatment threshold of generally above 50-75000 [given history of falls].  Continue surveillance without any active treatment.    #History of CAD  S/p stenting on aspirin.  Not on Plavix-secondary to history of falls.  #Discussed regarding follow-up at the [facility] cancer center with Dr. Mike Gip; patient declines.  She was to follow-up with PCP.  She will call us if she is having any bleeding issues.  # DISPOSITION:  #No follow-up/wants to follow with PCP  Cc; Dr.Grandis.

## 2018-05-28 IMAGING — CR DG CHEST 1V PORT
1 series · 1 of 1 positions shown · non-contrast
Comparison: 09/08/2015.

CLINICAL DATA: 84-year-old female with endotracheal tube in place.
Weakness. Hypertension. Myocardial infarction. Subsequent encounter.

EXAM:
PORTABLE CHEST 1 VIEW

[AP]
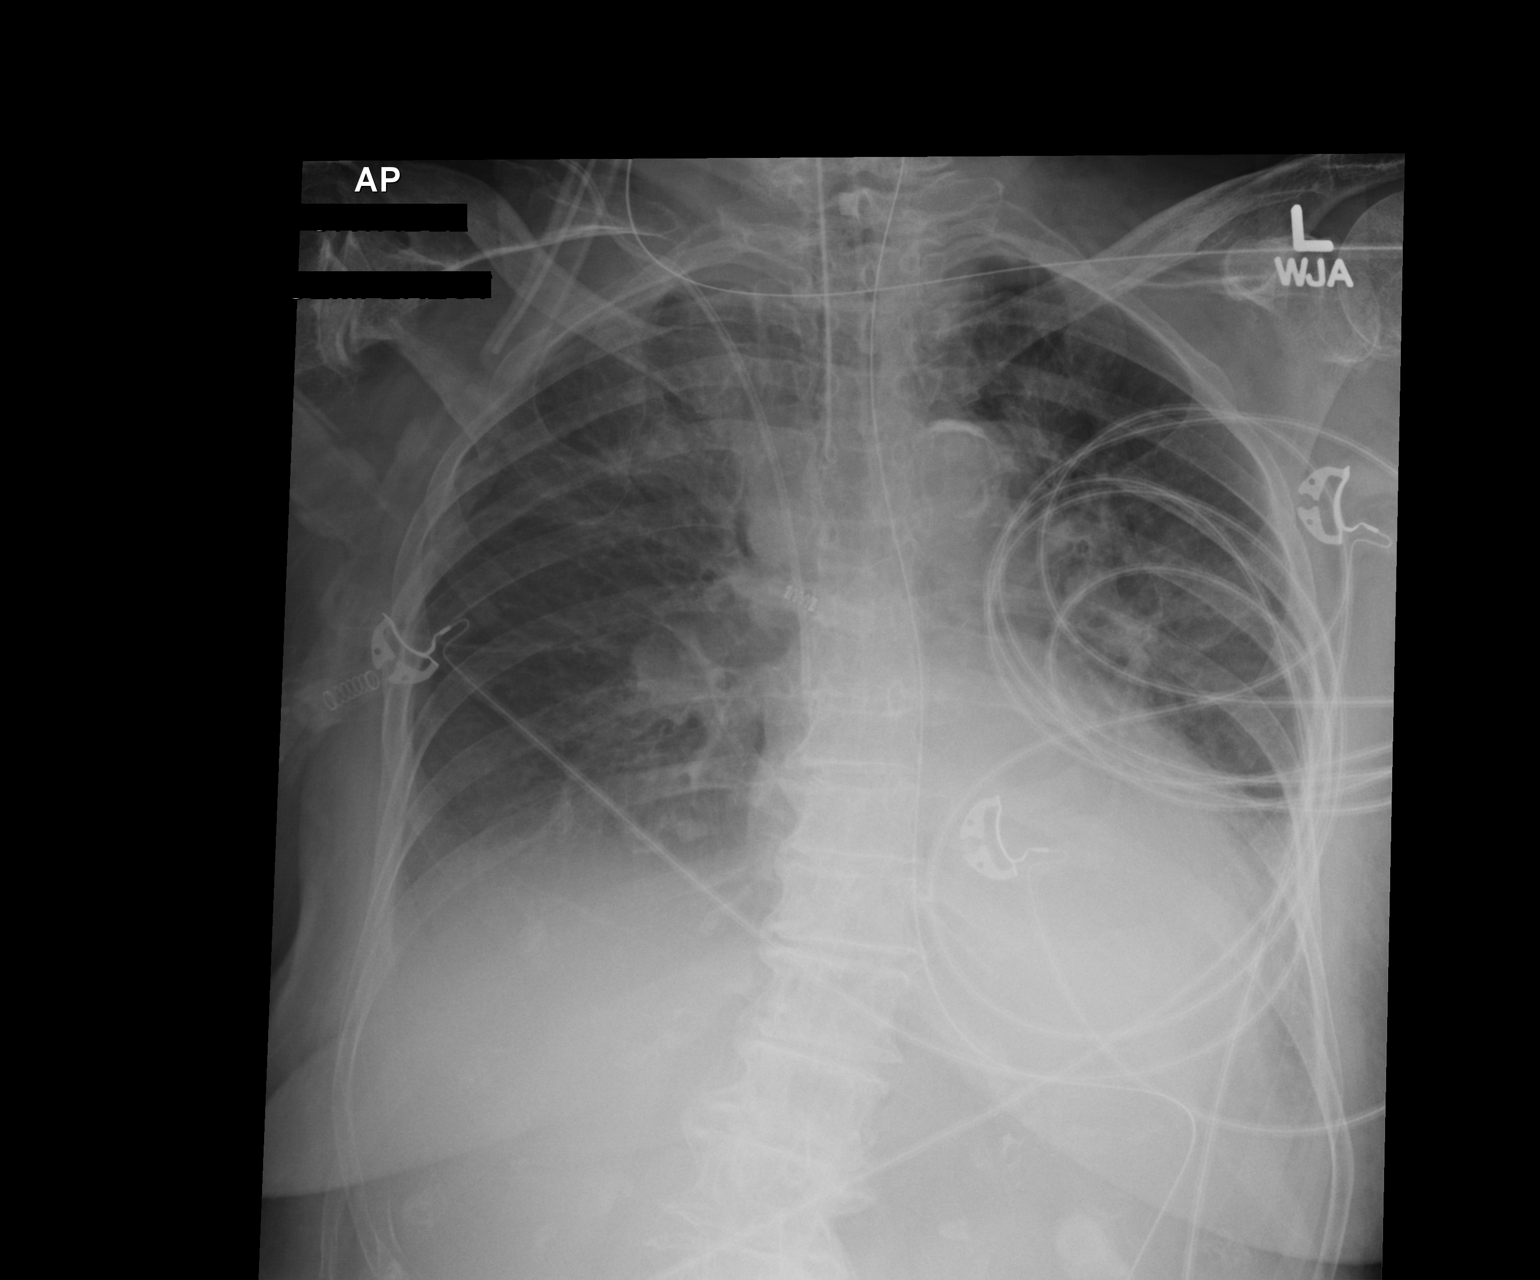

[1 of 1 positions shown; findings below may reference images not displayed]

FINDINGS: Endotracheal tube tip is 2.8 cm above the carina.

Right central line tip distal superior vena cava level.

Nasogastric tube courses below the diaphragm. Tip is not included on
the present exam.

Pulmonary vascular congestion/ mild pulmonary edema possibly with
posteriorly layering pleural fluid.

Persistent consolidation retrocardiac region may represent
atelectasis, infiltrate or mass. Appearance unchanged.

Prominence of right hilar region may represent pulmonary
vasculature. Attention to this on follow-up.

Calcified aorta.

Prominent right shoulder joint degenerative changes.

No gross pneumothorax.
IMPRESSION: Pulmonary vascular congestion/ pulmonary edema. Consolidation
retrocardiac region. Appearance unchanged.

Prominence right hilar region may represent pulmonary artery.
Attention to this on follow-up.

## 2018-10-15 ENCOUNTER — Other Ambulatory Visit: Payer: Self-pay | Admitting: Physician Assistant

## 2018-11-17 ENCOUNTER — Ambulatory Visit: Payer: Medicare Other | Admitting: Cardiovascular Disease

## 2018-11-17 ENCOUNTER — Other Ambulatory Visit: Payer: Self-pay

## 2018-11-17 VITALS — BP 152/66 | HR 77 | Ht 62.0 in | Wt 109.8 lb

## 2018-11-17 DIAGNOSIS — E785 Hyperlipidemia, unspecified: Secondary | ICD-10-CM

## 2018-11-17 DIAGNOSIS — I25119 Atherosclerotic heart disease of native coronary artery with unspecified angina pectoris: Secondary | ICD-10-CM

## 2018-11-17 DIAGNOSIS — I10 Essential (primary) hypertension: Secondary | ICD-10-CM

## 2018-11-17 DIAGNOSIS — I5022 Chronic systolic (congestive) heart failure: Secondary | ICD-10-CM

## 2018-11-17 NOTE — Progress Notes (Signed)
Cardiology Office Note   Date:  11/17/2018   ID:  Teresa Franklin, DOB [birth year], MRN [id number]  PCP:  [name], MD  Cardiologist:   [name], MD   Chief Complaint  Patient presents with  . Other    6 month follow up. Patient denies chest pain and SOB at this time. Meds reviewed verbally with patient.       History of Present Illness: Teresa Franklin is a 85 y.o. female who presents for a follow-up visit regarding coronary artery disease. She presented in 13-Oct-2015 with non-ST elevation myocardial infarction which was complicated by hypotension and high-grade AV block. Cardiac catheterization showed an occluded right posterior AV groove artery which was treated successfully with PCI and drug-eluting stent placement. She did not require a permanent pacemaker. Ejection fraction was normal. There was significant disease affecting the proximal and mid LAD. Medical therapy was recommended considering her frail status and involvement of a large diagonal branch at the site of stenosis.  She also had a stroke during the same hospitalization.  Currently with no residual neurologic deficits.  She had worsening exertional dyspnea in 2018.    Nuclear stress test showed no evidence of ischemia. There was evidence of prior infarct in the lateral wall with an ejection fraction of 34%. This was followed by an echocardiogram which showed an ejection fraction of 30-35% with severe hypokinesis of the inferolateral and inferior wall, moderate mitral regurgitation and no evidence of pulmonary hypertension. Left heart catheterization was done in July 2019 which showed widely patent RCA stent with no significant restenosis.  There was moderate proximal to mid LAD disease estimated to be 60% which actually appeared better than before.  LVEDP was normal.  She had issues with hypotension that required decreasing the dose of carvedilol last year.  Unfortunately, her husband died in 2022/10/13 after a long battle  with COPD.  They were together for 69 years and that has been extremely stressful on her.  She reports fatigue but no chest pain or shortness of breath.  She has occasional nausea.  Nausea was her anginal equivalent.  Past Medical History:  Diagnosis Date  . Anxiety   . AV block    a. 10/13/15 in setting of NSTEMI and RPAV intervention-->required temp wire but not PPM.  . Back pain   . Chronic combined systolic (congestive) and diastolic (congestive) heart failure ([employer])    a. 13-Oct-2015  Echo: EF 55-60%, no rwma, mod MR, mildly dil LA/RA, mod TR, PASP 22mmHg; b. 05/2016 Echo: EF 30-35%, inflat/inf sev HK, Gr1 DD; c07/23/19 Echo: EF 30-35%, mild LVH. Sev inf/infseptal HK. Gr1 DD. Mod MR. mildly dil LA. Mildly reduced RV fxn.  . Coronary artery disease    a. 13-Oct-2015 NSTEMI/PCI: LM nl, LAD 75p (med rx), 20 diffuse, 50d, LCX nl, OM1/2 nl, RCA 56m, RPAV 100 (2.25x12 Promus Premier DES) - complicated by 2:1 HB req temp wire post-PCI; b. 04/2016 MV: EF 34%, lateral HK/infarct. No ischemia.  . Diverticulitis   . Generalized anxiety disorder   . Hepatic steatosis   . HLD (hyperlipidemia)   . Hypertension   . Hypokalemia   . Hypomagnesemia   . Hypothyroidism   . Ischemic cardiomyopathy    a. 10-13-2015  Echo: EF 55-60%; b. 05/2016 Echo: EF 30-35%; c. 10-12-2017 Echo: EF 30-35%.  . OA (osteoarthritis)   . Osteopenia   . Sleep apnea   . Stroke [facility])    a. Oct 13, 2015 Right PCA territory  infarct/temoral lobe cerebral infarction-->MRA showed severe flow liminting stenosis of R P2 cerebral artery;  b. 08/2015 Carotid U/S: 1-39% bilat ICA stenosis.  . Subdural hemorrhage ([place])    a. 05/2013 in setting of fall.  . Thrombocytopenia ([place])   . Weakness     Past Surgical History:  Procedure Laterality Date  . CARDIAC CATHETERIZATION N/A 09/08/2015   Procedure: Temporary Pacemaker;  Surgeon: Leonie Man, MD;  Location: [place] CV LAB;  Service: Cardiovascular;  Laterality: N/A;  . CARDIAC CATHETERIZATION N/A 09/08/2015    Procedure: Left Heart Cath and Coronary Angiography;  Surgeon: Leonie Man, MD;  Location: [place] [facility] LAB;  Service: Cardiovascular;  Laterality: N/A;  . CARDIAC CATHETERIZATION N/A 09/08/2015   Procedure: Coronary Stent Intervention;  Surgeon: Leonie Man, MD;  Location: [place] [facility] LAB;  Service: Cardiovascular;  Laterality: N/A;  . CARDIAC CATHETERIZATION  09/08/2015   Procedure: Central Line Insertion;  Surgeon: Leonie Man, MD;  Location: [place] CV LAB;  Service: Cardiovascular;;  . CARDIAC CATHETERIZATION  09/08/2015   Procedure: Arterial Line Insertion;  Surgeon: Leonie Man, MD;  Location: [place] CV LAB;  Service: Cardiovascular;;  . CARDIAC CATHETERIZATION N/A 09/09/2015   Procedure: Temporary Wire;  Surgeon: Lorretta Harp, MD;  Location: [place] CV LAB;  Service: Cardiovascular;  Laterality: N/A;  . ELBOW SURGERY    . LEFT HEART CATH AND CORONARY ANGIOGRAPHY N/A 09/22/2017   Procedure: LEFT HEART CATH AND CORONARY ANGIOGRAPHY;  Surgeon: [name], MD;  Location: [place] [facility] LAB;  Service: Cardiovascular;  Laterality: N/A;     Current Outpatient Medications  Medication Sig Dispense Refill  . acetaminophen (TYLENOL) 325 MG tablet Take 650 mg by mouth every 6 (six) hours as needed for mild pain, fever or headache.     . ALPRAZolam (XANAX) 0.25 MG tablet Take 1 tablet (0.25 mg total) by mouth at bedtime as needed for anxiety. 15 tablet 0  . aspirin 81 MG chewable tablet Chew 1 tablet (81 mg total) by mouth daily.    Marland Kitchen atorvastatin (LIPITOR) 20 MG tablet Take 1 tablet (20 mg total) by mouth daily. 30 tablet 5  . calcium-vitamin D (OSCAL WITH D) 500-200 MG-UNIT tablet Take 1 tablet by mouth daily with breakfast.     . carvedilol (COREG) 6.25 MG tablet TAKE (1) TABLET BY MOUTH TWICE DAILY 180 tablet 3  . hydroxypropyl methylcellulose / hypromellose (ISOPTO TEARS / GONIOVISC) 2.5 % ophthalmic solution Place 1 drop into both eyes 4 (four) times  daily as needed for dry eyes.    Marland Kitchen levothyroxine (SYNTHROID, LEVOTHROID) 25 MCG tablet Take 25 mcg by mouth daily before breakfast.     . lisinopril (PRINIVIL,ZESTRIL) 40 MG tablet Take 20 mg by mouth daily.     . magnesium oxide (MAG-OX) 400 MG tablet Take 400 mg by mouth daily.    . meloxicam (MOBIC) 7.5 MG tablet Take 7.5 mg by mouth daily.    . Multiple Vitamins-Minerals (MULTIVITAMIN GUMMIES ADULT) CHEW Chew 2 each by mouth at bedtime.    . Multiple Vitamins-Minerals (PRESERVISION AREDS 2) CAPS Take 1 capsule by mouth daily.    . nitroGLYCERIN (NITROSTAT) 0.4 MG SL tablet Place 1 tablet (0.4 mg total) under the tongue every 5 (five) minutes x 3 doses as needed for chest pain. 25 tablet 2  . omeprazole (PRILOSEC) 20 MG capsule Take 20 mg by mouth daily before breakfast.     . polyethylene glycol (MIRALAX /  GLYCOLAX) packet Take 17 g by mouth daily as needed for mild constipation. 14 each 0  . senna-docusate (SENOKOT-S) 8.6-50 MG tablet Take 1 tablet by mouth as needed.     . sertraline (ZOLOFT) 25 MG tablet Take a half tablet daily x 1 week, then 1 po daily    . Simethicone 80 MG TABS Take 80 mg by mouth daily as needed (gas).     . sodium chloride (OCEAN) 0.65 % SOLN nasal spray Place 1 spray into both nostrils as needed for congestion. 1 Bottle 0   No current facility-administered medications for this visit.     Allergies:   Codeine, Flomax [tamsulosin], Norco [hydrocodone-acetaminophen], and Sulfa antibiotics    Social History:  The patient  reports that she has never smoked. She has never used smokeless tobacco. She reports that she does not drink alcohol or use drugs.   Family History:  The patient's family history includes Bipolar disorder in her sister; Diabetes in her sister; Hyperlipidemia in her mother, sister, and sister; Hypertension in her mother and sister; Prostate cancer in her brother; Stroke in her mother and sister.    ROS:  Please see the history of present illness.    Otherwise, review of systems are positive for none.   All other systems are reviewed and negative.    PHYSICAL EXAM: VS:  BP (!) 152/66 (BP Location: Left Arm, Patient Position: Sitting, Cuff Size: Normal)   Pulse 77   Ht 5\' 2"  (1.575 m)   Wt 109 lb 12 oz (49.8 kg)   BMI 20.07 kg/m  , BMI Body mass index is 20.07 kg/m. GEN: Well nourished, well developed, in no acute distress  HEENT: normal  Neck: no JVD, carotid bruits, or masses Cardiac: RRR; 1/6 systolic ejection murmur in the aortic area, no rubs, or gallops,no edema  Respiratory:  clear to auscultation bilaterally, normal work of breathing GI: soft, nontender, nondistended, + BS MS: no deformity or atrophy  Skin: warm and dry, no rash Neuro:  Strength and sensation are intact Psych: euthymic mood, full affect   EKG:  EKG is ordered today. The ekg ordered today demonstrates normal sinus rhythm with possible left atrial enlargement.  LVH with repolarization abnormalities.   Recent Labs: 03/08/2018: Hemoglobin 14.0; Platelets 101    Lipid Panel    Component Value Date/Time   CHOL 114 09/09/2015 0350   TRIG 111 09/09/2015 0350   HDL 38 (L) 09/09/2015 0350   CHOLHDL 3.0 09/09/2015 0350   VLDL 22 09/09/2015 0350   LDLCALC 54 09/09/2015 0350      Wt Readings from Last 3 Encounters:  11/17/18 109 lb 12 oz (49.8 kg)  03/08/18 116 lb 11.7 oz (53 kg)  02/10/18 113 lb 8 oz (51.5 kg)       ASSESSMENT AND PLAN:  1.  Coronary artery disease involving native coronary arteries with other forms of angina:    She is currently doing very well with no recurrent anginal symptoms.  Continue medical therapy.  2. Chronic systolic  heart failure:   She appears to be euvolemic without furosemide. Continue treatment with carvedilol and lisinopril.  3. Hyperlipidemia: Continue treatment with atorvastatin.  Most recent LDL was 77 which is close to target.  4.  Essential hypertension: Blood pressure is mildly elevated but she  reports better readings recently.  I am hesitant to make any changes given known history of intermittent hypotension.  Continue same medications for now.  5.  Grief: Husband died in  June and she seems to be gradually adjusting.  Disposition:   FU with me in 6 months  Signed,  [name], MD  11/17/2018 10:01 AM    Teresa Franklin

## 2018-11-17 NOTE — Patient Instructions (Signed)
Medication Instructions:  Your physician recommends that you continue on your current medications as directed. Please refer to the Current Medication list given to you today.  If you need a refill on your cardiac medications before your next appointment, please call your pharmacy.   Lab work: None ordered If you have labs (blood work) drawn today and your tests are completely normal, you will receive your results only by: . MyChart Message (if you have MyChart) OR . A paper copy in the mail If you have any lab test that is abnormal or we need to change your treatment, we will call you to review the results.  Testing/Procedures: None ordered  Follow-Up: At [facility], you and your health needs are our priority.  As part of our continuing mission to provide you with exceptional heart care, we have created designated Provider Care Teams.  These Care Teams include your primary Cardiologist (physician) and Advanced Practice Providers (APPs -  Physician Assistants and Nurse Practitioners) who all work together to provide you with the care you need, when you need it. You will need a follow up appointment in 6 months.  Please call our office 2 months in advance to schedule this appointment.  You may see Muhammad Arida, MD or one of the following Advanced Practice Providers on your designated Care Team:   Christopher Berge, NP Ryan Dunn, PA-C . Jacquelyn Visser, PA-C  Any Other Special Instructions Will Be Listed Below (If Applicable). N/A   

## 2018-11-22 ENCOUNTER — Other Ambulatory Visit: Payer: Self-pay | Admitting: Family Medicine

## 2018-11-22 DIAGNOSIS — Z1231 Encounter for screening mammogram for malignant neoplasm of breast: Secondary | ICD-10-CM

## 2018-12-01 NOTE — Progress Notes (Signed)
[facility]  [address] [place], [place] [address] Phone: [phone]  Fax: [phone]   Clinic Day:  12/02/2018  Referring physician: [name], MD  Chief Complaint: Teresa Franklin is a 85 y.o. female with thrombocytopenia who is seen for new patient assessment.  HPI: The patient has a history of intermittent low platelet count dating back to at least 2014.  She was diagnosed with thrombocytopenia, likely ITP, in 2017 following a diagnosis of CAD s/p stent.  She was on aspirin and Plavix. She was asymptomatic and was put on surveillance.   Abdomen and pelvis CT on 09/05/2015 revealed no evidence of splenomegaly.   Labs on 11/12/2015 revealed a normal B12 (2554), hepatitis B core antibody total, hepatitis B surface antigen, hepatitis C antibody and HIV antibody.  Immunofixation revealed a IgG monoclonal protein with lambda light chain specificity.  Platelets have been followed: 116,000 on 09/05/2015, 116,000 on 09/06/2015, 75,000 on 09/07/2015, 69,000 on 09/08/2015, 116,000 on 09/09/2015, 108,000 on 09-28-15, 98,000 on 11/12/2015, 94,000 on 12/03/2015, 98,000 on 12/24/2015, 86,000 on 02/04/2016, 112,000 on 05/12/2016, 102,000 on 07/07/2016, 68,000 on 07/10/2016, 102,000 on 09/07/2017, 103,000 on 09/08/2017, 108,000 on 09/17/2017, 101,000 on 03/08/2018.   She was followed by Dr. Rogue Bussing every 6 months and then annually. Noted reviewed.   The patient was last seen in the hematology clinic on 03/08/2018 by Dr. Rogue Bussing. At that time, she was fatigued. She noted easy bruising. She had chronic back and joint pain. Platelets were 101,000. She continued on surveillance.   Labs on 11/23/2018 included a hematocrit of 38.7, hemoglobin 13.2, platelets 68,000, and WBC 3700.   During the interim, the patient has lost 11 pounds in the past 3-4 months.  She states that she is trying to eat well and drinking Boost.  Appetite is modest.  She states that she  lives by herself and is lonely.  Her husband died on 09/28/18.  She has fallen twice.  She is getting rehabilitation for balance issues.  She denies any pain.   Past Medical History:  Diagnosis Date  . Anxiety   . AV block    a. 08/2015 in setting of NSTEMI and RPAV intervention-->required temp wire but not PPM.  . Back pain   . Chronic combined systolic (congestive) and diastolic (congestive) heart failure ([place])    a. 08/2015  Echo: EF 55-60%, no rwma, mod MR, mildly dil LA/RA, mod TR, PASP 56mmHg; b. 05/2016 Echo: EF 30-35%, inflat/inf sev HK, Gr1 DD; c. 08/2017 Echo: EF 30-35%, mild LVH. Sev inf/infseptal HK. Gr1 DD. Mod MR. mildly dil LA. Mildly reduced RV fxn.  . Coronary artery disease    a. 08/2015 NSTEMI/PCI: LM nl, LAD 75p (med rx), 20 diffuse, 50d, LCX nl, OM1/2 nl, RCA 75m, RPAV 100 (2.25x12 Promus Premier DES) - complicated by 2:1 HB req temp wire post-PCI; b. 04/2016 MV: EF 34%, lateral HK/infarct. No ischemia.  . Diverticulitis   . Generalized anxiety disorder   . Hepatic steatosis   . HLD (hyperlipidemia)   . Hypertension   . Hypokalemia   . Hypomagnesemia   . Hypothyroidism   . Ischemic cardiomyopathy    a. 08/2015  Echo: EF 55-60%; b. 05/2016 Echo: EF 30-35%; c. 08/2017 Echo: EF 30-35%.  . OA (osteoarthritis)   . Osteopenia   . Sleep apnea   . Stroke [facility])    a. 08/2015 Right PCA territory infarct/temoral lobe cerebral infarction-->MRA showed severe flow liminting stenosis of R P2 cerebral artery;  b. 08/2015 Carotid U/S: 1-39% bilat ICA stenosis.  . Subdural hemorrhage ([place])    a. 05/2013 in setting of fall.  . Thrombocytopenia ([place])   . Weakness     Past Surgical History:  Procedure Laterality Date  . CARDIAC CATHETERIZATION N/A 09/08/2015   Procedure: Temporary Pacemaker;  Surgeon: Leonie Man, MD;  Location: [place] [facility];  Service: Cardiovascular;  Laterality: N/A;  . CARDIAC CATHETERIZATION N/A 09/08/2015   Procedure: Left Heart Cath and Coronary  Angiography;  Surgeon: Leonie Man, MD;  Location: [place] [facility];  Service: Cardiovascular;  Laterality: N/A;  . CARDIAC CATHETERIZATION N/A 09/08/2015   Procedure: Coronary Stent Intervention;  Surgeon: Leonie Man, MD;  Location: [place] [facility];  Service: Cardiovascular;  Laterality: N/A;  . CARDIAC CATHETERIZATION  09/08/2015   Procedure: Central Line Insertion;  Surgeon: Leonie Man, MD;  Location: [place] [facility];  Service: Cardiovascular;;  . CARDIAC CATHETERIZATION  09/08/2015   Procedure: Arterial Line Insertion;  Surgeon: Leonie Man, MD;  Location: [place] [facility];  Service: Cardiovascular;;  . CARDIAC CATHETERIZATION N/A 09/09/2015   Procedure: Temporary Wire;  Surgeon: Lorretta Harp, MD;  Location: [place] [facility];  Service: Cardiovascular;  Laterality: N/A;  . ELBOW SURGERY    . LEFT HEART CATH AND CORONARY ANGIOGRAPHY N/A 09/22/2017   Procedure: LEFT HEART CATH AND CORONARY ANGIOGRAPHY;  Surgeon: [name], MD;  Location: [place] [facility];  Service: Cardiovascular;  Laterality: N/A;    Family History  Problem Relation Age of Onset  . Hypertension Mother   . Hyperlipidemia Mother   . Stroke Mother   . Diabetes Sister   . Hypertension Sister   . Hyperlipidemia Sister   . Bipolar disorder Sister   . Hyperlipidemia Sister   . Stroke Sister   . Prostate cancer Brother   . Breast cancer Neg Hx     Social History:  reports that she has never smoked. She has never used smokeless tobacco. She reports that she does not drink alcohol or use drugs.  She is widowed.  Her husband died on 09/16/18.  They were married for 69 years.  She has 3 children; one child lives in [place].  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  She lives in [place].  The patient is alone today.  Allergies:  Allergies  Allergen Reactions  . Codeine Nausea And Vomiting  . Flomax [Tamsulosin] Other (See Comments)    Pt states that this medication gave  her a kidney infection.    Lebron Quam [Hydrocodone-Acetaminophen] Nausea And Vomiting  . Sulfa Antibiotics Nausea And Vomiting    Current Medications: Current Outpatient Medications  Medication Sig Dispense Refill  . acetaminophen (TYLENOL) 325 MG tablet Take 650 mg by mouth every 6 (six) hours as needed for mild pain, fever or headache.     . ALPRAZolam (XANAX) 0.25 MG tablet Take 1 tablet (0.25 mg total) by mouth at bedtime as needed for anxiety. 15 tablet 0  . aspirin 81 MG chewable tablet Chew 1 tablet (81 mg total) by mouth daily.    Marland Kitchen atorvastatin (LIPITOR) 20 MG tablet Take 1 tablet (20 mg total) by mouth daily. 30 tablet 5  . calcium-vitamin D (OSCAL WITH D) 500-200 MG-UNIT tablet Take 1 tablet by mouth daily with breakfast.     . carvedilol (COREG) 6.25 MG tablet TAKE (1) TABLET BY MOUTH TWICE DAILY 180 tablet 3  . hydroxypropyl methylcellulose /  hypromellose (ISOPTO TEARS / GONIOVISC) 2.5 % ophthalmic solution Place 1 drop into both eyes 4 (four) times daily as needed for dry eyes.    Marland Kitchen levothyroxine (SYNTHROID, LEVOTHROID) 25 MCG tablet Take 25 mcg by mouth daily before breakfast.     . lisinopril (PRINIVIL,ZESTRIL) 40 MG tablet Take 20 mg by mouth daily.     . Multiple Vitamins-Minerals (MULTIVITAMIN GUMMIES ADULT) CHEW Chew 2 each by mouth at bedtime.    . Multiple Vitamins-Minerals (PRESERVISION AREDS 2) CAPS Take 1 capsule by mouth daily.    Marland Kitchen omeprazole (PRILOSEC) 20 MG capsule Take 20 mg by mouth daily before breakfast.     . sertraline (ZOLOFT) 25 MG tablet Take a half tablet daily x 1 week, then 1 po daily    . magnesium oxide (MAG-OX) 400 MG tablet Take 400 mg by mouth daily.    . meloxicam (MOBIC) 7.5 MG tablet Take 7.5 mg by mouth daily.    . nitroGLYCERIN (NITROSTAT) 0.4 MG SL tablet Place 1 tablet (0.4 mg total) under the tongue every 5 (five) minutes x 3 doses as needed for chest pain. (Patient not taking: Reported on 12/02/2018) 25 tablet 2  . polyethylene glycol  (MIRALAX / GLYCOLAX) packet Take 17 g by mouth daily as needed for mild constipation. (Patient not taking: Reported on 12/02/2018) 14 each 0  . senna-docusate (SENOKOT-S) 8.6-50 MG tablet Take 1 tablet by mouth as needed.     . Simethicone 80 MG TABS Take 80 mg by mouth daily as needed (gas).     . sodium chloride (OCEAN) 0.65 % SOLN nasal spray Place 1 spray into both nostrils as needed for congestion. (Patient not taking: Reported on 12/02/2018) 1 Bottle 0   No current facility-administered medications for this visit.     Review of Systems  Constitutional: Positive for weight loss (11 pounds in 3-4 months). Negative for chills, diaphoresis, fever and malaise/fatigue.       Feels "ok".  HENT: Negative.  Negative for congestion, ear pain, hearing loss, nosebleeds, sinus pain and sore throat.   Eyes: Negative.  Negative for blurred vision, double vision and photophobia.  Respiratory: Negative.  Negative for cough, hemoptysis, sputum production, shortness of breath and wheezing.   Cardiovascular: Negative.  Negative for chest pain, palpitations, orthopnea and leg swelling.  Gastrointestinal: Negative.  Negative for abdominal pain, blood in stool, constipation, diarrhea, heartburn, melena, nausea and vomiting.       Appetite "not back".  Genitourinary: Negative.  Negative for dysuria, frequency, hematuria and urgency.  Musculoskeletal: Positive for falls (x2 secondary to balance issues). Negative for back pain, joint pain and myalgias.  Skin: Negative.  Negative for itching and rash.  Neurological: Negative.  Negative for dizziness, tingling, sensory change, speech change, focal weakness, weakness and headaches.       Balance issues.  Endo/Heme/Allergies: Negative.  Does not bruise/bleed easily.  Psychiatric/Behavioral: Negative.  Negative for depression (lost her husband in 08/2018; she feels lonely) and memory loss. The patient is not nervous/anxious and does not have insomnia.   All other  systems reviewed and are negative.  Performance status (ECOG): 1  Vitals Blood pressure 128/70, pulse 77, temperature (!) 96.4 F (35.8 C), temperature source Tympanic, resp. rate 18, height 5\' 2"  (1.575 m), weight 108 lb 3.9 oz (49.1 kg), SpO2 99 %.   Physical Exam  Constitutional: She is oriented to person, place, and time.  Thin elderly woman sitting comfortably in the exam room in no acute distress.  HENT:  Head: Normocephalic and atraumatic.  Mouth/Throat: Oropharynx is clear and moist. No oropharyngeal exudate.  Short gray hair.  Mask.  Eyes: Pupils are equal, round, and reactive to light. Conjunctivae and EOM are normal.  Glasses.  Blue eyes.  Neck: Normal range of motion. Neck supple. No JVD present.  Cardiovascular: Normal rate, regular rhythm and normal heart sounds.  No murmur heard. Pulmonary/Chest: Effort normal and breath sounds normal. No respiratory distress. She has no wheezes. She has no rales.  Abdominal: Soft. Bowel sounds are normal. She exhibits no distension and no mass. There is no abdominal tenderness. There is no rebound and no guarding.  Musculoskeletal: Normal range of motion.        General: No tenderness or edema.  Lymphadenopathy:    She has no cervical adenopathy.    She has no axillary adenopathy.       Right: No supraclavicular adenopathy present.       Left: No supraclavicular adenopathy present.  Neurological: She is alert and oriented to person, place, and time.  Skin: Skin is warm and dry. No rash noted. She is not diaphoretic. No erythema. No pallor.  No petechiae.  Psychiatric: She has a normal mood and affect. Her behavior is normal. Judgment and thought content normal.  Nursing note and vitals reviewed.   Office Visit on 12/02/2018  Component Date Value Ref Range Status  . Path Review 12/02/2018 Peripheral blood smear is reviewed.   Final   Comment: Undergoes hematology consultation for thrombocytopenia.  Persistent and stable  thrombocytopenia for over a year. Unremarkable RBC, WBC, and platelet morphology. ITP is suspected clinically. Additional laboratory studies and underway for further evaluation. Reviewed by Dellia Nims Reuel Derby, M.D. Performed at [facility], [address], [place], [place] [address]   . TSH 12/02/2018 0.883  0.350 - 4.500 uIU/mL Final   Comment: Performed by a 3rd Generation assay with a functional sensitivity of <=0.01 uIU/mL. Performed at [facility], [address], [place], [place] [address]   . aPTT 12/02/2018 31  24 - 36 seconds Final   Performed at [facility], [address] [place], [place] [address]  . Prothrombin Time 12/02/2018 13.4  11.4 - 15.2 seconds Final  . INR 12/02/2018 1.0  0.8 - 1.2 Final   Comment: (NOTE) INR goal varies based on device and disease states. Performed at [facility], [address] [place], [place] [address]   . Folate 12/02/2018 62.1  >5.9 ng/mL Final   Comment: RESULT CONFIRMED BY MANUAL DILUTION Performed at [facility], [address] [place], [place] [address]   . Kappa free light chain 12/02/2018 20.7* 3.3 - 19.4 mg/L Final  . Lamda free light chains 12/02/2018 15.5  5.7 - 26.3 mg/L Final  . Kappa, lamda light chain ratio 12/02/2018 1.34  0.26 - 1.65 Final   Comment: (NOTE) Performed At: [facility] [place], [place] [id number] Rush Farmer MD [id number]   . Anti Nuclear Antibody (ANA) 12/02/2018 Negative  Negative Final   Comment: (NOTE) Performed At: [facility] [place], [place] [id number] Rush Farmer MD [id number]   . Sodium 12/02/2018 138  135 - 145 mmol/L Final  . Potassium 12/02/2018 3.5  3.5 - 5.1 mmol/L Final  . Chloride 12/02/2018 102  98 - 111 mmol/L Final  . CO2 12/02/2018 26  22 - 32 mmol/L Final  . Glucose, Bld 12/02/2018 125* 70 - 99 mg/dL Final  . BUN  12/02/2018 18  8 - 23 mg/dL Final  . Creatinine, Ser  12/02/2018 0.74  0.44 - 1.00 mg/dL Final  . Calcium 12/02/2018 9.6  8.9 - 10.3 mg/dL Final  . Total Protein 12/02/2018 7.0  6.5 - 8.1 g/dL Final  . Albumin 12/02/2018 4.2  3.5 - 5.0 g/dL Final  . AST 12/02/2018 25  15 - 41 U/L Final  . ALT 12/02/2018 12  0 - 44 U/L Final  . Alkaline Phosphatase 12/02/2018 56  38 - 126 U/L Final  . Total Bilirubin 12/02/2018 1.1  0.3 - 1.2 mg/dL Final  . GFR calc non Af Amer 12/02/2018 >60  >60 mL/min Final  . GFR calc Af Amer 12/02/2018 >60  >60 mL/min Final  . Anion gap 12/02/2018 10  5 - 15 Final   Performed at [facility], [address], [place], [place] [address]  . Platelet CT in [place] 12/02/2018 89   Final   Performed at [facility], [address], [place], [place] [address]  . WBC 12/02/2018 4.9  4.0 - 10.5 K/uL Final  . RBC 12/02/2018 4.43  3.87 - 5.11 MIL/uL Final  . Hemoglobin 12/02/2018 14.4  12.0 - 15.0 g/dL Final  . HCT 12/02/2018 41.3  36.0 - 46.0 % Final  . MCV 12/02/2018 93.2  80.0 - 100.0 fL Final  . MCH 12/02/2018 32.5  26.0 - 34.0 pg Final  . MCHC 12/02/2018 34.9  30.0 - 36.0 g/dL Final  . RDW 12/02/2018 13.2  11.5 - 15.5 % Final  . Platelets 12/02/2018 106* 150 - 400 K/uL Final   Comment: Immature Platelet Fraction may be clinically indicated, consider ordering this additional test [id number]   . nRBC 12/02/2018 0.0  0.0 - 0.2 % Final  . Neutrophils Relative % 12/02/2018 76  % Final  . Neutro Abs 12/02/2018 3.6  1.7 - 7.7 K/uL Final  . Lymphocytes Relative 12/02/2018 17  % Final  . Lymphs Abs 12/02/2018 0.8  0.7 - 4.0 K/uL Final  . Monocytes Relative 12/02/2018 5  % Final  . Monocytes Absolute 12/02/2018 0.3  0.1 - 1.0 K/uL Final  . Eosinophils Relative 12/02/2018 2  % Final  . Eosinophils Absolute 12/02/2018 0.1  0.0 - 0.5 K/uL Final  . Basophils Relative 12/02/2018 0  % Final  . Basophils Absolute 12/02/2018 0.0  0.0 - 0.1 K/uL Final  . Immature Granulocytes 12/02/2018 0  % Final  . Abs  Immature Granulocytes 12/02/2018 0.01  0.00 - 0.07 K/uL Final   Performed at [facility], [address] [place],  [address]    Assessment:  Teresa Franklin is a 85 y.o. female with chronic mild thrombocytopenia felt secondary to immune mediate thrombocytopenic purpura (ITP).  She has had intermittent thrombocytopenia since at least 2014.  Platelet count has typically ranged between 98,000 - 116,000.  Platelet count has dipped below 90,000 on 3 occasions (08/2015, 06/2016, and 11/2018).  Abdomen and pelvis CT on 09/05/2015 revealed no evidence of splenomegaly.   Work-up on 11/12/2015 revealed the following normal studies:  B12, hepatitis B core antibody total, hepatitis B surface antigen, hepatitis C antibody, and HIV antibody.  Immunofixation revealed a IgG monoclonal protein with lambda light chain specificity.  Platelet count has been followed: 116,000 on 09/05/2015, 116,000 on 09/06/2015, 75,000 on 09/07/2015, 69,000 on 09/08/2015, 116,000 on 09/09/2015, 108,000 on 09/10/2015, 98,000 on 11/12/2015, 94,000 on 12/03/2015, 98,000 on 12/24/2015, 86,000 on 02/04/2016, 112,000 on 05/12/2016, 102,000 on 07/07/2016,  68,000 on 07/10/2016, 97,000 on 04/30/2017, 102,000 on 09/07/2017, 103,000 on 09/08/2017, 108,000 on 09/17/2017, 108,000 on 10/27/2018, 101,000 on 03/08/2018, 94,000 on 08/25/2018, and 68,000 on 11/23/2018.   Symptomatically, she has lost 11 pounds since her husband died.  She denies any new medications or herbal products.  Exam reveals no adenopathy or hepatosplenomegaly.  She has no petechiae.  Plan: 1. Labs today: CBC with diff, platelet count in a blue top tube, CMP, ANA with reflex, folate, PT, PTT, TSH, SPEP. 2. Peripheral smear for pathologic review. 3.   Chronic immune mediated thrombocytopenic purpura (ITP)  Review entire medical history, diagnosis and management of ITP.   Review prior CBCs and platelet counts.  Review prior abdominal imaging (no  splenomegaly).  Discuss plan for additional studies today.  Discuss indications for treatment. 4.   Monoclonal gammopathy  Immunofixation on 11/12/2015 revealed an IgG monoclonal protein with lambda light chain specificity.  Discuss plan for reassessment. 5.   RTC in 1 week for MD assessment, review of work-up, and discussion regarding direction of therapy.  I discussed the assessment and treatment plan with the patient.  The patient was provided an opportunity to ask questions and all were answered.  The patient agreed with the plan and demonstrated an understanding of the instructions.  The patient was advised to call back if the symptoms worsen or if the condition fails to improve as anticipated.   Lequita Asal, MD, PhD    12/02/2018, 3:35 PM

## 2018-12-02 ENCOUNTER — Inpatient Hospital Stay: Payer: Medicare Other

## 2018-12-02 ENCOUNTER — Encounter: Payer: Self-pay | Admitting: Hematology and Oncology

## 2018-12-02 ENCOUNTER — Inpatient Hospital Stay: Payer: Medicare Other | Attending: Hematology and Oncology | Admitting: Hematology and Oncology

## 2018-12-02 ENCOUNTER — Other Ambulatory Visit: Payer: Self-pay

## 2018-12-02 VITALS — BP 128/70 | HR 77 | Temp 96.4°F | Resp 18 | Ht 62.0 in | Wt 108.2 lb

## 2018-12-02 DIAGNOSIS — Z791 Long term (current) use of non-steroidal anti-inflammatories (NSAID): Secondary | ICD-10-CM | POA: Diagnosis not present

## 2018-12-02 DIAGNOSIS — D472 Monoclonal gammopathy: Secondary | ICD-10-CM | POA: Diagnosis not present

## 2018-12-02 DIAGNOSIS — R634 Abnormal weight loss: Secondary | ICD-10-CM | POA: Diagnosis not present

## 2018-12-02 DIAGNOSIS — I252 Old myocardial infarction: Secondary | ICD-10-CM | POA: Insufficient documentation

## 2018-12-02 DIAGNOSIS — I2510 Atherosclerotic heart disease of native coronary artery without angina pectoris: Secondary | ICD-10-CM | POA: Insufficient documentation

## 2018-12-02 DIAGNOSIS — Z955 Presence of coronary angioplasty implant and graft: Secondary | ICD-10-CM | POA: Diagnosis not present

## 2018-12-02 DIAGNOSIS — E785 Hyperlipidemia, unspecified: Secondary | ICD-10-CM | POA: Insufficient documentation

## 2018-12-02 DIAGNOSIS — E039 Hypothyroidism, unspecified: Secondary | ICD-10-CM | POA: Insufficient documentation

## 2018-12-02 DIAGNOSIS — K219 Gastro-esophageal reflux disease without esophagitis: Secondary | ICD-10-CM | POA: Diagnosis not present

## 2018-12-02 DIAGNOSIS — Z79899 Other long term (current) drug therapy: Secondary | ICD-10-CM | POA: Diagnosis not present

## 2018-12-02 DIAGNOSIS — Z7902 Long term (current) use of antithrombotics/antiplatelets: Secondary | ICD-10-CM | POA: Insufficient documentation

## 2018-12-02 DIAGNOSIS — Z7982 Long term (current) use of aspirin: Secondary | ICD-10-CM | POA: Insufficient documentation

## 2018-12-02 DIAGNOSIS — I110 Hypertensive heart disease with heart failure: Secondary | ICD-10-CM | POA: Diagnosis not present

## 2018-12-02 DIAGNOSIS — I5042 Chronic combined systolic (congestive) and diastolic (congestive) heart failure: Secondary | ICD-10-CM | POA: Insufficient documentation

## 2018-12-02 DIAGNOSIS — Z8249 Family history of ischemic heart disease and other diseases of the circulatory system: Secondary | ICD-10-CM | POA: Diagnosis not present

## 2018-12-02 DIAGNOSIS — D693 Immune thrombocytopenic purpura: Secondary | ICD-10-CM | POA: Insufficient documentation

## 2018-12-02 LAB — CBC WITH DIFFERENTIAL/PLATELET
Abs Immature Granulocytes: 0.01 10*3/uL (ref 0.00–0.07)
Basophils Absolute: 0 10*3/uL (ref 0.0–0.1)
Basophils Relative: 0 %
Eosinophils Absolute: 0.1 10*3/uL (ref 0.0–0.5)
Eosinophils Relative: 2 %
HCT: 41.3 % (ref 36.0–46.0)
Hemoglobin: 14.4 g/dL (ref 12.0–15.0)
Immature Granulocytes: 0 %
Lymphocytes Relative: 17 %
Lymphs Abs: 0.8 10*3/uL (ref 0.7–4.0)
MCH: 32.5 pg (ref 26.0–34.0)
MCHC: 34.9 g/dL (ref 30.0–36.0)
MCV: 93.2 fL (ref 80.0–100.0)
Monocytes Absolute: 0.3 10*3/uL (ref 0.1–1.0)
Monocytes Relative: 5 %
Neutro Abs: 3.6 10*3/uL (ref 1.7–7.7)
Neutrophils Relative %: 76 %
Platelets: 106 10*3/uL — ABNORMAL LOW (ref 150–400)
RBC: 4.43 MIL/uL (ref 3.87–5.11)
RDW: 13.2 % (ref 11.5–15.5)
WBC: 4.9 10*3/uL (ref 4.0–10.5)
nRBC: 0 % (ref 0.0–0.2)

## 2018-12-02 LAB — APTT: aPTT: 31 seconds (ref 24–36)

## 2018-12-02 LAB — FOLATE: Folate: 62.1 ng/mL (ref >5.9)

## 2018-12-02 LAB — COMPREHENSIVE METABOLIC PANEL
ALT: 12 U/L (ref 0–44)
AST: 25 U/L (ref 15–41)
Albumin: 4.2 g/dL (ref 3.5–5.0)
Alkaline Phosphatase: 56 U/L (ref 38–126)
Anion gap: 10 (ref 5–15)
BUN: 18 mg/dL (ref 8–23)
CO2: 26 mmol/L (ref 22–32)
Calcium: 9.6 mg/dL (ref 8.9–10.3)
Chloride: 102 mmol/L (ref 98–111)
Creatinine, Ser: 0.74 mg/dL (ref 0.44–1.00)
GFR calc Af Amer: >60 mL/min (ref >60)
GFR calc non Af Amer: >60 mL/min (ref >60)
Glucose, Bld: 125 mg/dL — ABNORMAL HIGH (ref 70–99)
Potassium: 3.5 mmol/L (ref 3.5–5.1)
Sodium: 138 mmol/L (ref 135–145)
Total Bilirubin: 1.1 mg/dL (ref 0.3–1.2)
Total Protein: 7 g/dL (ref 6.5–8.1)

## 2018-12-02 LAB — PATHOLOGIST SMEAR REVIEW

## 2018-12-02 LAB — PROTIME-INR
INR: 1 (ref 0.8–1.2)
Prothrombin Time: 13.4 seconds (ref 11.4–15.2)

## 2018-12-02 LAB — TSH: TSH: 0.883 u[IU]/mL (ref 0.350–4.500)

## 2018-12-02 LAB — PLATELET BY CITRATE: Platelet CT in Citrate: 89

## 2018-12-02 NOTE — Progress Notes (Signed)
Patient states she has has not been eating very well due to her husband dying.

## 2018-12-03 LAB — ANA W/REFLEX: Anti Nuclear Antibody (ANA): NEGATIVE

## 2018-12-04 LAB — KAPPA/LAMBDA LIGHT CHAINS
Kappa free light chain: 20.7 mg/L — ABNORMAL HIGH (ref 3.3–19.4)
Kappa, lambda light chain ratio: 1.34 (ref 0.26–1.65)
Lambda free light chains: 15.5 mg/L (ref 5.7–26.3)

## 2018-12-07 NOTE — Progress Notes (Signed)
[facility]  [address] [place], [place] [address] Phone: [phone]  Fax: [phone]   Clinic Day:  12/08/2018  Referring physician: [name], MD  Chief Complaint: Teresa Franklin is a 85 y.o. female with thrombocytopenia who is seen for 1 week assessment and review of interval work-up.  HPI: The patient was last seen in the hematology clinic for a new patient assessment on 12/02/2018.  At that time, she denied any bruising or bleeding.  Baseline platelet count had ranged between 98,000 - 116,000.  CBC on 11/23/2018 revealed a hematocrit of 38.7, hemoglobin 13.2, MCV 95, platelets 68,000, and WBC 3700.  She denied any new medications or herbal products.  Work-up included hematocrit 41.3, hemoglobin 14.4, platelets 106,000, WBC 4,900 with ANC 3600.  Differential was unremarkable.  Platelet count in a citrate tube was 89,000.  CMP was normal.  ANA was negative. Kappa free light chain 20.7, lambda free light chains 15.5, and ratio 1.34 (normal). Folate was 62.1. PT was 13.4 with an INR 1.0.  PTT was 31. TSH was 0.883.   SPEP is pending.  Peripheral smear revealed persistent and stable thrombocytopenia for over a year. Unremarkable RBC, WBC, and platelet morphology. ITP was suspected clinically.   During the interim, the patient felt "all right". She has stable bruising and bleeding on her forearms.    Past Medical History:  Diagnosis Date   Anxiety    AV block    a. 08/2015 in setting of NSTEMI and RPAV intervention-->required temp wire but not PPM.   Back pain    Chronic combined systolic (congestive) and diastolic (congestive) heart failure ([place])    a. 08/2015  Echo: EF 55-60%, no rwma, mod MR, mildly dil LA/RA, mod TR, PASP 51mmHg; b. 05/2016 Echo: EF 30-35%, inflat/inf sev HK, Gr1 DD; c. 08/2017 Echo: EF 30-35%, mild LVH. Sev inf/infseptal HK. Gr1 DD. Mod MR. mildly dil LA. Mildly reduced RV fxn.   Coronary artery disease    a. 08/2015  NSTEMI/PCI: LM nl, LAD 75p (med rx), 20 diffuse, 50d, LCX nl, OM1/2 nl, RCA 88m, RPAV 100 (2.25x12 Promus Premier DES) - complicated by 2:1 HB req temp wire post-PCI; b. 04/2016 MV: EF 34%, lateral HK/infarct. No ischemia.   Diverticulitis    Generalized anxiety disorder    Hepatic steatosis    HLD (hyperlipidemia)    Hypertension    Hypokalemia    Hypomagnesemia    Hypothyroidism    Ischemic cardiomyopathy    a. 08/2015  Echo: EF 55-60%; b. 05/2016 Echo: EF 30-35%; c. 08/2017 Echo: EF 30-35%.   OA (osteoarthritis)    Osteopenia    Sleep apnea    Stroke [facility])    a. 08/2015 Right PCA territory infarct/temoral lobe cerebral infarction-->MRA showed severe flow liminting stenosis of R P2 cerebral artery;  b. 08/2015 Carotid U/S: 1-39% bilat ICA stenosis.   Subdural hemorrhage ([place])    a. 05/2013 in setting of fall.   Thrombocytopenia ([place])    Weakness     Past Surgical History:  Procedure Laterality Date   CARDIAC CATHETERIZATION N/A 09/08/2015   Procedure: Temporary Pacemaker;  Surgeon: Leonie Man, MD;  Location: [place] [facility];  Service: Cardiovascular;  Laterality: N/A;   CARDIAC CATHETERIZATION N/A 09/08/2015   Procedure: Left Heart Cath and Coronary Angiography;  Surgeon: Leonie Man, MD;  Location: [place] [facility];  Service: Cardiovascular;  Laterality: N/A;   CARDIAC CATHETERIZATION N/A 09/08/2015   Procedure: Coronary Stent  Intervention;  Surgeon: Leonie Man, MD;  Location: [place] [facility];  Service: Cardiovascular;  Laterality: N/A;   CARDIAC CATHETERIZATION  09/08/2015   Procedure: Central Line Insertion;  Surgeon: Leonie Man, MD;  Location: [place] [facility];  Service: Cardiovascular;;   CARDIAC CATHETERIZATION  09/08/2015   Procedure: Arterial Line Insertion;  Surgeon: Leonie Man, MD;  Location: [place] [facility];  Service: Cardiovascular;;   CARDIAC CATHETERIZATION N/A 09/09/2015   Procedure: Temporary Wire;  Surgeon: Lorretta Harp, MD;  Location: [place] [facility];  Service: Cardiovascular;  Laterality: N/A;   ELBOW SURGERY     LEFT HEART CATH AND CORONARY ANGIOGRAPHY N/A 09/22/2017   Procedure: LEFT HEART CATH AND CORONARY ANGIOGRAPHY;  Surgeon: [name], MD;  Location: [place] [facility];  Service: Cardiovascular;  Laterality: N/A;    Family History  Problem Relation Age of Onset   Hypertension Mother    Hyperlipidemia Mother    Stroke Mother    Diabetes Sister    Hypertension Sister    Hyperlipidemia Sister    Bipolar disorder Sister    Hyperlipidemia Sister    Stroke Sister    Prostate cancer Brother    Breast cancer Neg Hx     Social History:  reports that she has never smoked. She has never used smokeless tobacco. She reports that she does not drink alcohol or use drugs. She is widowed.  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  She lives in [place]. The patient is alone today.  Allergies:  Allergies  Allergen Reactions   Codeine Nausea And Vomiting   Flomax [Tamsulosin] Other (See Comments)    Pt states that this medication gave her a kidney infection.     Norco [Hydrocodone-Acetaminophen] Nausea And Vomiting   Sulfa Antibiotics Nausea And Vomiting    Current Medications: Current Outpatient Medications  Medication Sig Dispense Refill   acetaminophen (TYLENOL) 325 MG tablet Take 650 mg by mouth every 6 (six) hours as needed for mild pain, fever or headache.      ALPRAZolam (XANAX) 0.25 MG tablet Take 1 tablet (0.25 mg total) by mouth at bedtime as needed for anxiety. 15 tablet 0   aspirin 81 MG chewable tablet Chew 1 tablet (81 mg total) by mouth daily.     atorvastatin (LIPITOR) 20 MG tablet Take 1 tablet (20 mg total) by mouth daily. 30 tablet 5   calcium-vitamin D (OSCAL WITH D) 500-200 MG-UNIT tablet Take 1 tablet by mouth daily with breakfast.      carvedilol (COREG) 6.25 MG tablet TAKE (1) TABLET BY MOUTH TWICE DAILY 180 tablet 3   hydroxypropyl  methylcellulose / hypromellose (ISOPTO TEARS / GONIOVISC) 2.5 % ophthalmic solution Place 1 drop into both eyes 4 (four) times daily as needed for dry eyes.     levothyroxine (SYNTHROID, LEVOTHROID) 25 MCG tablet Take 25 mcg by mouth daily before breakfast.      lisinopril (PRINIVIL,ZESTRIL) 40 MG tablet Take 20 mg by mouth daily.      magnesium oxide (MAG-OX) 400 MG tablet Take 400 mg by mouth daily.     meloxicam (MOBIC) 7.5 MG tablet Take 7.5 mg by mouth daily.     Multiple Vitamins-Minerals (MULTIVITAMIN GUMMIES ADULT) CHEW Chew 2 each by mouth at bedtime.     Multiple Vitamins-Minerals (PRESERVISION AREDS 2) CAPS Take 1 capsule by mouth daily.     omeprazole (PRILOSEC) 20 MG capsule Take 20 mg by mouth daily before  breakfast.      senna-docusate (SENOKOT-S) 8.6-50 MG tablet Take 1 tablet by mouth as needed.      sertraline (ZOLOFT) 25 MG tablet Take a half tablet daily x 1 week, then 1 po daily     Simethicone 80 MG TABS Take 80 mg by mouth daily as needed (gas).      nitroGLYCERIN (NITROSTAT) 0.4 MG SL tablet Place 1 tablet (0.4 mg total) under the tongue every 5 (five) minutes x 3 doses as needed for chest pain. (Patient not taking: Reported on 12/02/2018) 25 tablet 2   polyethylene glycol (MIRALAX / GLYCOLAX) packet Take 17 g by mouth daily as needed for mild constipation. (Patient not taking: Reported on 12/02/2018) 14 each 0   sodium chloride (OCEAN) 0.65 % SOLN nasal spray Place 1 spray into both nostrils as needed for congestion. (Patient not taking: Reported on 12/02/2018) 1 Bottle 0   No current facility-administered medications for this visit.     Review of Systems  Constitutional: Negative for chills, diaphoresis, fever, malaise/fatigue and weight loss (stable).       Feels "all right".  HENT: Negative.  Negative for congestion, ear discharge, ear pain, hearing loss, nosebleeds, sinus pain and sore throat.   Eyes: Negative.  Negative for blurred vision and double  vision.  Respiratory: Negative for cough, hemoptysis, sputum production and shortness of breath.   Cardiovascular: Negative.  Negative for chest pain, palpitations and leg swelling.  Gastrointestinal: Negative.  Negative for abdominal pain, blood in stool, constipation, diarrhea, melena, nausea and vomiting.  Genitourinary: Negative.  Negative for dysuria, frequency, hematuria and urgency.  Musculoskeletal: Negative.  Negative for back pain and joint pain.  Skin: Negative for itching and rash.  Neurological: Negative.  Negative for dizziness, tingling, sensory change, speech change, focal weakness, weakness and headaches.  Endo/Heme/Allergies: Bruises/bleeds easily (forearms).  Psychiatric/Behavioral: Negative.  Negative for depression and memory loss. The patient is not nervous/anxious and does not have insomnia.   All other systems reviewed and are negative.  Performance status (ECOG): 1  Vitals Blood pressure 130/60, pulse 70, temperature 98.4 F (36.9 C), temperature source Oral, resp. rate 18, weight 109 lb 12.6 oz (49.8 kg), SpO2 99 %.  Physical Exam  Constitutional: She is oriented to person, place, and time. She appears well-developed and well-nourished. No distress.  HENT:  Head: Normocephalic and atraumatic.  Mouth/Throat: Oropharynx is clear and moist. No oropharyngeal exudate.  Short styled gray hair.  Eyes: Conjunctivae and EOM are normal. No scleral icterus.  Glasses.  Neurological: She is alert and oriented to person, place, and time.  Skin: Skin is warm and dry. No rash noted. She is not diaphoretic. No erythema. No pallor.  Few upper extremity bruises.  No petechiae.  Psychiatric: She has a normal mood and affect. Her behavior is normal. Judgment and thought content normal.  Nursing note and vitals reviewed.   No visits with results within 3 Day(s) from this visit.  Latest known visit with results is:  Office Visit on 12/02/2018  Component Date Value Ref Range  Status   Path Review 12/02/2018 Peripheral blood smear is reviewed.   Final   Comment: Undergoes hematology consultation for thrombocytopenia.  Persistent and stable thrombocytopenia for over a year. Unremarkable RBC, WBC, and platelet morphology. ITP is suspected clinically. Additional laboratory studies and underway for further evaluation. Reviewed by Dellia Nims Reuel Derby, M.D. Performed at [facility], [address] [place], [place] [address]    TSH 12/02/2018 0.883  0.350 - 4.500 uIU/mL Final   Comment: Performed by a 3rd Generation assay with a functional sensitivity of <=0.01 uIU/mL. Performed at [facility], [address] [place], [place] [address]    aPTT 12/02/2018 31  24 - 36 seconds Final   Performed at [facility], [address] [place], [place] [address]   Prothrombin Time 12/02/2018 13.4  11.4 - 15.2 seconds Final   INR 12/02/2018 1.0  0.8 - 1.2 Final   Comment: (NOTE) INR goal varies based on device and disease states. Performed at [facility], [address] [place], [place] [address]    Folate 12/02/2018 62.1  >5.9 ng/mL Final   Comment: RESULT CONFIRMED BY MANUAL DILUTION Performed at [facility], [address] [place], [place] [address]    Kappa free light chain 12/02/2018 20.7* 3.3 - 19.4 mg/L Final   Lamda free light chains 12/02/2018 15.5  5.7 - 26.3 mg/L Final   Kappa, lamda light chain ratio 12/02/2018 1.34  0.26 - 1.65 Final   Comment: (NOTE) Performed At: [facility] [place], [place] [id number] Rush Farmer MD [id number]    Anti Nuclear Antibody (ANA) 12/02/2018 Negative  Negative Final   Comment: (NOTE) Performed At: [facility] [place], [place] [id number] Rush Farmer MD [id number]    Sodium 12/02/2018 138  135 - 145 mmol/L Final   Potassium 12/02/2018 3.5  3.5 - 5.1 mmol/L Final   Chloride 12/02/2018 102  98 - 111  mmol/L Final   CO2 12/02/2018 26  22 - 32 mmol/L Final   Glucose, Bld 12/02/2018 125* 70 - 99 mg/dL Final   BUN 12/02/2018 18  8 - 23 mg/dL Final   Creatinine, Ser 12/02/2018 0.74  0.44 - 1.00 mg/dL Final   Calcium 12/02/2018 9.6  8.9 - 10.3 mg/dL Final   Total Protein 12/02/2018 7.0  6.5 - 8.1 g/dL Final   Albumin 12/02/2018 4.2  3.5 - 5.0 g/dL Final   AST 12/02/2018 25  15 - 41 U/L Final   ALT 12/02/2018 12  0 - 44 U/L Final   Alkaline Phosphatase 12/02/2018 56  38 - 126 U/L Final   Total Bilirubin 12/02/2018 1.1  0.3 - 1.2 mg/dL Final   GFR calc non Af Amer 12/02/2018 >60  >60 mL/min Final   GFR calc Af Amer 12/02/2018 >60  >60 mL/min Final   Anion gap 12/02/2018 10  5 - 15 Final   Performed at [facility], [address] [place], [place] [address]   Platelet CT in [place] 12/02/2018 89   Final   Performed at [facility], [address], [place], [place] [address]   WBC 12/02/2018 4.9  4.0 - 10.5 K/uL Final   RBC 12/02/2018 4.43  3.87 - 5.11 MIL/uL Final   Hemoglobin 12/02/2018 14.4  12.0 - 15.0 g/dL Final   HCT 12/02/2018 41.3  36.0 - 46.0 % Final   MCV 12/02/2018 93.2  80.0 - 100.0 fL Final   MCH 12/02/2018 32.5  26.0 - 34.0 pg Final   MCHC 12/02/2018 34.9  30.0 - 36.0 g/dL Final   RDW 12/02/2018 13.2  11.5 - 15.5 % Final   Platelets 12/02/2018 106* 150 - 400 K/uL Final   Comment: Immature Platelet Fraction may be clinically indicated, consider ordering this additional test [id number]    nRBC 12/02/2018 0.0  0.0 - 0.2 % Final   Neutrophils Relative % 12/02/2018 76  %  Final   Neutro Abs 12/02/2018 3.6  1.7 - 7.7 K/uL Final   Lymphocytes Relative 12/02/2018 17  % Final   Lymphs Abs 12/02/2018 0.8  0.7 - 4.0 K/uL Final   Monocytes Relative 12/02/2018 5  % Final   Monocytes Absolute 12/02/2018 0.3  0.1 - 1.0 K/uL Final   Eosinophils Relative 12/02/2018 2  % Final   Eosinophils Absolute 12/02/2018 0.1  0.0 - 0.5 K/uL  Final   Basophils Relative 12/02/2018 0  % Final   Basophils Absolute 12/02/2018 0.0  0.0 - 0.1 K/uL Final   Immature Granulocytes 12/02/2018 0  % Final   Abs Immature Granulocytes 12/02/2018 0.01  0.00 - 0.07 K/uL Final   Performed at [facility], [address] [place],  [address]    Assessment:  Teresa Franklin is a 85 y.o. female with chronic immune mediated thrombocytopenic purpura (ITP).  She has had intermittent thrombocytopenia since at least 2014.  Platelet count has typically ranged between 98,000 - 116,000.  Platelet count has dipped below 90,000 on 3 occasions (08/2015, 06/2016, and 11/2018).  Abdomen and pelvis CT on 09/05/2015 revealed no evidence of splenomegaly.   Work-up on 11/12/2015 revealed the following normal studies:  B12, hepatitis B core antibody total, hepatitis B surface antigen, hepatitis C antibody, and HIV antibody.  Immunofixation revealed a IgG monoclonal protein with lambda light chain specificity.  Work-up on 12/02/2018 included hematocrit 41.3, hemoglobin 14.4, platelets 106,000, WBC 4,900 with ANC 3600.  Differential was unremarkable.  Platelet count in a citrate tube was 89,000.  Normal studies included:  CMP, ANA, free light chain ratio, folate, PT, PTT, and TSH.  Peripheral smear revealed persistent and stable thrombocytopenia for over a year. RBC, WBC, and platelet morphology was unremarkable.   Platelet count has been followed: 116,000 on 09/05/2015, 116,000 on 09/06/2015, 75,000 on 09/07/2015, 69,000 on 09/08/2015, 116,000 on 09/09/2015, 108,000 on 09/10/2015, 98,000 on 11/12/2015, 94,000 on 12/03/2015, 98,000 on 12/24/2015, 86,000 on 02/04/2016, 112,000 on 05/12/2016, 102,000 on 07/07/2016, 68,000 on 07/10/2016, 97,000 on 04/30/2017, 102,000 on 09/07/2017, 103,000 on 09/08/2017, 108,000 on 09/17/2017, 108,000 on 10/27/2018, 101,000 on 03/08/2018, 94,000 on 08/25/2018, 68,000 on 11/23/2018, and 106,000 on 12/02/2018.   She ha a  monoclonal gammopathy of unknown significance.  SPEP on 12/02/2018 revealed a 0.2 gm/dL IgG monoclonal protein with lambda light chain specificity.  Symptomatically, she denies any excess bruising or bleeding.  Exam reveals no adenopathy or hepatosplenomegaly.  Platelet count is 106,000.  Plan: 1.   Chronic immune mediated thrombocytopenic purpura (ITP)             Review interval work-up.  Platelet count has returned to baseline.  Discuss indications for treatment.  Discuss treatment options when needed including steroids, IVIG, Rituxan, N-plate, and Promacta. 2.   Monoclonal gammopathy             Immunofixation on 11/12/2015 revealed an IgG monoclonal protein with lambda light chain specificity.             SPEP pending from 12/02/2018/ 3.   RTC in 1 month for CBC with diff. 4.   RTC in 6 months for MD assessment and labs (CBC with diff, SPEP, hepatitis B core antibody, hepatitis B surface antigen, hepatitis C antibody).  Addendum:  SPEP on 12/02/2018 revealed a 0.2 gm/dL IgG monoclonal protein with lambda light chain specificity.  Plan to continue to monitor every 6 months.  I discussed the assessment and treatment plan with the patient.  The patient was provided an opportunity to ask questions and all were answered.  The patient agreed with the plan and demonstrated an understanding of the instructions.  The patient was advised to call back if the symptoms worsen or if the condition fails to improve as anticipated.   Lequita Asal, MD, PhD    12/08/2018, 10:24 AM  I, Selena Batten, am acting as scribe for [employer]. Mike Gip, MD, PhD.  I, Jinnie Onley C. Mike Gip, MD, have reviewed the above documentation for accuracy and completeness, and I agree with the above.

## 2018-12-08 ENCOUNTER — Ambulatory Visit
Admission: RE | Admit: 2018-12-08 | Discharge: 2018-12-08 | Disposition: A | Discharge status: Home / Self Care | Payer: Medicare Other | Source: Ambulatory Visit | Attending: Family Medicine | Admitting: Family Medicine

## 2018-12-08 ENCOUNTER — Encounter: Payer: Self-pay | Admitting: Hematology and Oncology

## 2018-12-08 ENCOUNTER — Ambulatory Visit: Payer: Medicare Other | Admitting: Hematology and Oncology

## 2018-12-08 ENCOUNTER — Other Ambulatory Visit: Payer: Self-pay

## 2018-12-08 ENCOUNTER — Inpatient Hospital Stay: Payer: Medicare Other | Admitting: Hematology and Oncology

## 2018-12-08 ENCOUNTER — Inpatient Hospital Stay: Payer: Medicare Other | Attending: Hematology and Oncology | Admitting: Hematology and Oncology

## 2018-12-08 VITALS — BP 130/60 | HR 70 | Temp 98.4°F | Resp 18 | Wt 109.8 lb

## 2018-12-08 DIAGNOSIS — D693 Immune thrombocytopenic purpura: Secondary | ICD-10-CM | POA: Diagnosis present

## 2018-12-08 DIAGNOSIS — E785 Hyperlipidemia, unspecified: Secondary | ICD-10-CM | POA: Insufficient documentation

## 2018-12-08 DIAGNOSIS — I252 Old myocardial infarction: Secondary | ICD-10-CM | POA: Diagnosis not present

## 2018-12-08 DIAGNOSIS — Z791 Long term (current) use of non-steroidal anti-inflammatories (NSAID): Secondary | ICD-10-CM | POA: Insufficient documentation

## 2018-12-08 DIAGNOSIS — Z79899 Other long term (current) drug therapy: Secondary | ICD-10-CM | POA: Diagnosis not present

## 2018-12-08 DIAGNOSIS — I5042 Chronic combined systolic (congestive) and diastolic (congestive) heart failure: Secondary | ICD-10-CM | POA: Diagnosis not present

## 2018-12-08 DIAGNOSIS — Z8249 Family history of ischemic heart disease and other diseases of the circulatory system: Secondary | ICD-10-CM | POA: Insufficient documentation

## 2018-12-08 DIAGNOSIS — Z955 Presence of coronary angioplasty implant and graft: Secondary | ICD-10-CM | POA: Insufficient documentation

## 2018-12-08 DIAGNOSIS — D472 Monoclonal gammopathy: Secondary | ICD-10-CM | POA: Diagnosis present

## 2018-12-08 DIAGNOSIS — Z1231 Encounter for screening mammogram for malignant neoplasm of breast: Secondary | ICD-10-CM | POA: Diagnosis present

## 2018-12-08 DIAGNOSIS — Z7982 Long term (current) use of aspirin: Secondary | ICD-10-CM | POA: Insufficient documentation

## 2018-12-08 DIAGNOSIS — I110 Hypertensive heart disease with heart failure: Secondary | ICD-10-CM | POA: Insufficient documentation

## 2018-12-08 DIAGNOSIS — Z8673 Personal history of transient ischemic attack (TIA), and cerebral infarction without residual deficits: Secondary | ICD-10-CM | POA: Diagnosis not present

## 2018-12-08 NOTE — Progress Notes (Signed)
Patient here for follow up. Denies any new concerns.

## 2018-12-09 LAB — MULTIPLE MYELOMA PANEL, SERUM
Albumin SerPl Elph-Mcnc: 4.2 g/dL (ref 2.9–4.4)
Albumin/Glob SerPl: 1.6 (ref 0.7–1.7)
Alpha 1: 0.3 g/dL (ref 0.0–0.4)
Alpha2 Glob SerPl Elph-Mcnc: 0.8 g/dL (ref 0.4–1.0)
B-Globulin SerPl Elph-Mcnc: 0.9 g/dL (ref 0.7–1.3)
Gamma Glob SerPl Elph-Mcnc: 0.8 g/dL (ref 0.4–1.8)
Globulin, Total: 2.7 g/dL (ref 2.2–3.9)
IgA: 103 mg/dL (ref 64–422)
IgG (Immunoglobin G), Serum: 785 mg/dL (ref 586–1602)
IgM (Immunoglobulin M), Srm: 126 mg/dL (ref 26–217)
M Protein SerPl Elph-Mcnc: 0.2 g/dL — ABNORMAL HIGH
Total Protein ELP: 6.9 g/dL (ref 6.0–8.5)

## 2019-01-05 ENCOUNTER — Other Ambulatory Visit: Payer: Self-pay

## 2019-01-06 ENCOUNTER — Inpatient Hospital Stay: Payer: Medicare Other | Attending: Hematology and Oncology

## 2019-01-06 DIAGNOSIS — D693 Immune thrombocytopenic purpura: Secondary | ICD-10-CM | POA: Insufficient documentation

## 2019-01-06 DIAGNOSIS — D472 Monoclonal gammopathy: Secondary | ICD-10-CM | POA: Insufficient documentation

## 2019-01-06 LAB — CBC WITH DIFFERENTIAL/PLATELET
Abs Immature Granulocytes: 0.01 10*3/uL (ref 0.00–0.07)
Basophils Absolute: 0 10*3/uL (ref 0.0–0.1)
Basophils Relative: 0 %
Eosinophils Absolute: 0.1 10*3/uL (ref 0.0–0.5)
Eosinophils Relative: 2 %
HCT: 42.1 % (ref 36.0–46.0)
Hemoglobin: 14.3 g/dL (ref 12.0–15.0)
Immature Granulocytes: 0 %
Lymphocytes Relative: 23 %
Lymphs Abs: 1 10*3/uL (ref 0.7–4.0)
MCH: 31.6 pg (ref 26.0–34.0)
MCHC: 34 g/dL (ref 30.0–36.0)
MCV: 93.1 fL (ref 80.0–100.0)
Monocytes Absolute: 0.3 10*3/uL (ref 0.1–1.0)
Monocytes Relative: 6 %
Neutro Abs: 2.8 10*3/uL (ref 1.7–7.7)
Neutrophils Relative %: 69 %
Platelets: 90 10*3/uL — ABNORMAL LOW (ref 150–400)
RBC: 4.52 MIL/uL (ref 3.87–5.11)
RDW: 13.1 % (ref 11.5–15.5)
WBC: 4.1 10*3/uL (ref 4.0–10.5)
nRBC: 0 % (ref 0.0–0.2)

## 2019-01-09 ENCOUNTER — Ambulatory Visit
Admission: EM | Admit: 2019-01-09 | Discharge: 2019-01-09 | Disposition: A | Discharge status: Home / Self Care | Payer: Medicare Other | Attending: Family Medicine | Admitting: Family Medicine

## 2019-01-09 ENCOUNTER — Other Ambulatory Visit: Payer: Self-pay

## 2019-01-09 DIAGNOSIS — N3001 Acute cystitis with hematuria: Secondary | ICD-10-CM

## 2019-01-09 DIAGNOSIS — R300 Dysuria: Secondary | ICD-10-CM

## 2019-01-09 LAB — URINALYSIS, COMPLETE (UACMP) WITH MICROSCOPIC
Bilirubin Urine: NEGATIVE
Glucose, UA: NEGATIVE mg/dL
Ketones, ur: NEGATIVE mg/dL
Nitrite: NEGATIVE
RBC / HPF: >50 RBC/hpf (ref 0–5)
Specific Gravity, Urine: 1.025 (ref 1.005–1.030)
pH: 5.5 (ref 5.0–8.0)

## 2019-01-09 MED ORDER — CEPHALEXIN 500 MG PO CAPS: 500.0000 mg | ORAL_CAPSULE | Freq: Two times a day (BID) | ORAL | 0 refills | Status: DC

## 2019-01-09 NOTE — Discharge Instructions (Signed)
Medication as prescribed.  Take care  Dr. Sollie Vultaggio  

## 2019-01-09 NOTE — ED Triage Notes (Signed)
Pt reports burning with urination since Saturday.

## 2019-01-09 NOTE — ED Provider Notes (Signed)
[facility]    CSN: [id number] Arrival date & time: 01/09/19  1217  History   Chief Complaint Chief Complaint  Patient presents with   Dysuria   HPI  85 year old female presents with dysuria.  Started on Saturday.  Patient reports dysuria.  She has ongoing issues with frequency and leakage.  She is currently on Ditropan.  No abdominal pain.  No flank pain.  No fever.  He states that the burning occurs at the beginning and end of her stream.  No medications or interventions tried.  No relieving factors.  No other associated symptoms.  No other complaints.  PMH, Surgical Hx, Family Hx, Social History reviewed and updated as below.  Past Medical History:  Diagnosis Date   Anxiety    AV block    a. 08/2015 in setting of NSTEMI and RPAV intervention-->required temp wire but not PPM.   Back pain    Chronic combined systolic (congestive) and diastolic (congestive) heart failure ([place])    a. 08/2015  Echo: EF 55-60%, no rwma, mod MR, mildly dil LA/RA, mod TR, PASP 33mmHg; b. 05/2016 Echo: EF 30-35%, inflat/inf sev HK, Gr1 DD; c. 08/2017 Echo: EF 30-35%, mild LVH. Sev inf/infseptal HK. Gr1 DD. Mod MR. mildly dil LA. Mildly reduced RV fxn.   Coronary artery disease    a. 08/2015 NSTEMI/PCI: LM nl, LAD 75p (med rx), 20 diffuse, 50d, LCX nl, OM1/2 nl, RCA 50m, RPAV 100 (2.25x12 Promus Premier DES) - complicated by 2:1 HB req temp wire post-PCI; b. 04/2016 MV: EF 34%, lateral HK/infarct. No ischemia.   Diverticulitis    Generalized anxiety disorder    Hepatic steatosis    HLD (hyperlipidemia)    Hypertension    Hypokalemia    Hypomagnesemia    Hypothyroidism    Ischemic cardiomyopathy    a. 08/2015  Echo: EF 55-60%; b. 05/2016 Echo: EF 30-35%; c. 08/2017 Echo: EF 30-35%.   OA (osteoarthritis)    Osteopenia    Sleep apnea    Stroke [facility])    a. 08/2015 Right PCA territory infarct/temoral lobe cerebral infarction-->MRA showed severe flow liminting stenosis of R P2  cerebral artery;  b. 08/2015 Carotid U/S: 1-39% bilat ICA stenosis.   Subdural hemorrhage ([place])    a. 05/2013 in setting of fall.   Thrombocytopenia ([place])    Weakness     Patient Active Problem List   Diagnosis Date Noted   Monoclonal gammopathy 12/02/2018   Coronary artery disease involving native coronary artery of native heart with angina pectoris ([place])    Anxiety disorder 08/05/2016   Preop cardiovascular exam    Elbow fracture, right 07/07/2016   Thrombocytopenia ([place]) [id number]   Diastolic CHF, acute on chronic (HCC) 09/17/2015   SOB (shortness of breath)    UTI (urinary tract infection) 09/16/2015   Acute right MCA stroke ([place])    Fall    Hypokalemia 09/13/2015   Hypophosphatemia 09/12/2015   Encounter for intubation    Acute respiratory failure (HCC)    Complete heart block ([place]) 09/08/2015   Acute blood loss anemia -thought to be related to in her muscular injection of Phenergan 09/08/2015   Acute respiratory failure with hypoxia (HCC) 09/08/2015   Heart block AV second degree    Essential hypertension    Arterial hypotension    Cardiogenic shock (HCC)    NSTEMI (non-ST elevated myocardial infarction) ([place]) 09/06/2015   Subdural hemorrhage (HCC) -history of     Class: History of   Hyperlipidemia with target  LDL less than 70    Hepatic steatosis     Past Surgical History:  Procedure Laterality Date   CARDIAC CATHETERIZATION N/A 09/08/2015   Procedure: Temporary Pacemaker;  Surgeon: Leonie Man, MD;  Location: [place] [facility];  Service: Cardiovascular;  Laterality: N/A;   CARDIAC CATHETERIZATION N/A 09/08/2015   Procedure: Left Heart Cath and Coronary Angiography;  Surgeon: Leonie Man, MD;  Location: [place] [facility];  Service: Cardiovascular;  Laterality: N/A;   CARDIAC CATHETERIZATION N/A 09/08/2015   Procedure: Coronary Stent Intervention;  Surgeon: Leonie Man, MD;  Location: [place] [facility];  Service:  Cardiovascular;  Laterality: N/A;   CARDIAC CATHETERIZATION  09/08/2015   Procedure: Central Line Insertion;  Surgeon: Leonie Man, MD;  Location: [place] [facility];  Service: Cardiovascular;;   CARDIAC CATHETERIZATION  09/08/2015   Procedure: Arterial Line Insertion;  Surgeon: Leonie Man, MD;  Location: [place] [facility];  Service: Cardiovascular;;   CARDIAC CATHETERIZATION N/A 09/09/2015   Procedure: Temporary Wire;  Surgeon: Lorretta Harp, MD;  Location: [place] [facility];  Service: Cardiovascular;  Laterality: N/A;   ELBOW SURGERY     LEFT HEART CATH AND CORONARY ANGIOGRAPHY N/A 09/22/2017   Procedure: LEFT HEART CATH AND CORONARY ANGIOGRAPHY;  Surgeon: [name], MD;  Location: [place] [facility];  Service: Cardiovascular;  Laterality: N/A;    OB History   No obstetric history on file.      Home Medications    Prior to Admission medications   Medication Sig Start Date End Date Taking? Authorizing Provider  folic acid-pyridoxine-cyancobalamin (FOLBIC) 2.5-25-2 MG TABS tablet Take by mouth. 12/13/18  Yes [provider]  acetaminophen (TYLENOL) 325 MG tablet Take 650 mg by mouth every 6 (six) hours as needed for mild pain, fever or headache.     [provider]  ALPRAZolam Duanne Moron) 0.25 MG tablet Take 1 tablet (0.25 mg total) by mouth at bedtime as needed for anxiety. 07/10/16   Gladstone Lighter, MD  aspirin 81 MG chewable tablet Chew 1 tablet (81 mg total) by mouth daily. 09/13/15   Lyda Jester M, PA-C  atorvastatin (LIPITOR) 20 MG tablet Take 1 tablet (20 mg total) by mouth daily. 09/30/15   [name], MD  calcium-vitamin D (OSCAL WITH D) 500-200 MG-UNIT tablet Take 1 tablet by mouth daily with breakfast.     [provider]  carvedilol (COREG) 6.25 MG tablet TAKE (1) TABLET BY MOUTH TWICE DAILY 10/17/18   Dunn, Areta Haber, PA-C  cephALEXin (KEFLEX) 500 MG capsule Take 1 capsule (500 mg total) by mouth 2 (two) times daily.  01/09/19   Coral Spikes, DO  hydroxypropyl methylcellulose / hypromellose (ISOPTO TEARS / GONIOVISC) 2.5 % ophthalmic solution Place 1 drop into both eyes 4 (four) times daily as needed for dry eyes.    [provider]  levothyroxine (SYNTHROID, LEVOTHROID) 25 MCG tablet Take 25 mcg by mouth daily before breakfast.     [provider]  lisinopril (PRINIVIL,ZESTRIL) 40 MG tablet Take 20 mg by mouth daily.     [provider]  Multiple Vitamins-Minerals (MULTIVITAMIN GUMMIES ADULT) CHEW Chew 2 each by mouth at bedtime.    [provider]  Multiple Vitamins-Minerals (PRESERVISION AREDS 2) CAPS Take 1 capsule by mouth daily.    [provider]  omeprazole (PRILOSEC) 20 MG capsule Take 20 mg by mouth daily before breakfast.     [provider]  polyethylene  glycol (MIRALAX / GLYCOLAX) packet Take 17 g by mouth daily as needed for mild constipation. Patient not taking: Reported on 12/02/2018 07/10/16   Gladstone Lighter, MD  senna-docusate (SENOKOT-S) 8.6-50 MG tablet Take 1 tablet by mouth as needed.     [provider]  sertraline (ZOLOFT) 25 MG tablet Take a half tablet daily x 1 week, then 1 po daily 11/20/16   [provider]  Simethicone 80 MG TABS Take 80 mg by mouth daily as needed (gas).     [provider]  sodium chloride (OCEAN) 0.65 % SOLN nasal spray Place 1 spray into both nostrils as needed for congestion. Patient not taking: Reported on 12/02/2018 09/18/15   Cristal Ford, DO  nitroGLYCERIN (NITROSTAT) 0.4 MG SL tablet Place 1 tablet (0.4 mg total) under the tongue every 5 (five) minutes x 3 doses as needed for chest pain. Patient not taking: Reported on 12/02/2018 09/13/15 01/09/19  Consuelo Pandy, PA-C    Family History Family History  Problem Relation Age of Onset   Hypertension Mother    Hyperlipidemia Mother    Stroke Mother    Diabetes Sister    Hypertension Sister    Hyperlipidemia  Sister    Bipolar disorder Sister    Hyperlipidemia Sister    Stroke Sister    Prostate cancer Brother    Breast cancer Neg Hx     Social History Social History   Tobacco Use   Smoking status: Never Smoker   Smokeless tobacco: Never Used  Substance Use Topics   Alcohol use: No    Alcohol/week: 0.0 standard drinks   Drug use: No     Allergies   Codeine, Flomax [tamsulosin], Norco [hydrocodone-acetaminophen], and Sulfa antibiotics   Review of Systems Review of Systems  Constitutional: Negative.   Gastrointestinal: Negative.   Genitourinary: Positive for dysuria.   Physical Exam Triage Vital Signs ED Triage Vitals  Enc Vitals Group     BP 01/09/19 1225 (!) 158/63     Pulse Rate 01/09/19 1225 65     Resp 01/09/19 1225 16     Temp 01/09/19 1225 97.8 F (36.6 C)     Temp Source 01/09/19 1225 Oral     SpO2 01/09/19 1225 99 %     Weight 01/09/19 1224 110 lb (49.9 kg)     Height 01/09/19 1224 5\' 2"  (1.575 m)     Head Circumference --      Peak Flow --      Pain Score 01/09/19 1224 5     Pain Loc --      Pain Edu? --      Excl. in [place]? --    Updated Vital Signs BP (!) 158/63 (BP Location: Left Arm)    Pulse 65    Temp 97.8 F (36.6 C) (Oral)    Resp 16    Ht 5\' 2"  (1.575 m)    Wt 49.9 kg    SpO2 99%    BMI 20.12 kg/m   Visual Acuity Right Eye Distance:   Left Eye Distance:   Bilateral Distance:    Right Eye Near:   Left Eye Near:    Bilateral Near:     Physical Exam Constitutional:      General: She is not in acute distress.    Appearance: Normal appearance. She is not ill-appearing.  HENT:     Head: Normocephalic and atraumatic.  Eyes:     General:  Right eye: No discharge.        Left eye: No discharge.     Conjunctiva/sclera: Conjunctivae normal.  Cardiovascular:     Rate and Rhythm: Normal rate and regular rhythm.     Heart sounds: No murmur.  Pulmonary:     Effort: Pulmonary effort is normal. No respiratory distress.    Abdominal:     General: There is no distension.     Palpations: Abdomen is soft.     Tenderness: There is no abdominal tenderness.  Neurological:     Mental Status: She is alert.  Psychiatric:        Mood and Affect: Mood normal.        Behavior: Behavior normal.    UC Treatments / Results  Labs (all labs ordered are listed, but only abnormal results are displayed) Labs Reviewed  URINALYSIS, COMPLETE (UACMP) WITH MICROSCOPIC - Abnormal; Notable for the following components:      Result Value   APPearance CLOUDY (*)    Hgb urine dipstick MODERATE (*)    Protein, ur TRACE (*)    Leukocytes,Ua SMALL (*)    Bacteria, UA MANY (*)    All other components within normal limits  URINE CULTURE    EKG   Radiology No results found.  Procedures Procedures (including critical care time)  Medications Ordered in UC Medications - No data to display  Initial Impression / Assessment and Plan / UC Course  I have reviewed the triage vital signs and the nursing notes.  Pertinent labs & imaging results that were available during my care of the patient were reviewed by me and considered in my medical decision making (see chart for details).    85 year old female presents with dysuria.  Urinalysis consistent with UTI.  Sending culture.  Placing on Keflex.  Final Clinical Impressions(s) / UC Diagnoses   Final diagnoses:  Acute cystitis with hematuria     Discharge Instructions     Medication as prescribed.  Take care  Dr. Lacinda Axon    ED Prescriptions    Medication Sig Dispense Auth. Provider   cephALEXin (KEFLEX) 500 MG capsule Take 1 capsule (500 mg total) by mouth 2 (two) times daily. 14 capsule Thersa Salt G, DO     PDMP not reviewed this encounter.   Coral Spikes, DO 01/09/19 1316

## 2019-01-12 LAB — URINE CULTURE: Culture: >=100000 — AB

## 2019-01-13 ENCOUNTER — Telehealth (HOSPITAL_COMMUNITY): Payer: Self-pay | Admitting: Emergency Medicine

## 2019-01-13 MED ORDER — NITROFURANTOIN MONOHYD MACRO 100 MG PO CAPS: 100.0000 mg | ORAL_CAPSULE | Freq: Two times a day (BID) | ORAL | 0 refills | Status: DC

## 2019-01-13 NOTE — Telephone Encounter (Signed)
Urine culture was positive for e coli resistant to keflex given at urgent care visit. Prescription for macrobid per Teresa Franklin sent to pharmacy of choice. Pt called and made aware. Pt educated to follow up if symptoms are not improving. Verbalized understanding.

## 2019-05-05 ENCOUNTER — Ambulatory Visit: Payer: Medicare PPO | Attending: Internal Medicine

## 2019-05-05 DIAGNOSIS — Z23 Encounter for immunization: Secondary | ICD-10-CM | POA: Insufficient documentation

## 2019-05-05 NOTE — Progress Notes (Signed)
   Covid-19 Vaccination Clinic  Name:  THADDEUS TESKA    MRN: [id number] DOB: Aug 03, 1930  05/05/2019  Ms. Faustino was observed post Covid-19 immunization for 15 minutes without incidence. She was provided with Vaccine Information Sheet and instruction to access the V-Safe system.   Ms. Brusky was instructed to call 911 with any severe reactions post vaccine: Marland Kitchen Difficulty breathing  . Swelling of your face and throat  . A fast heartbeat  . A bad rash all over your body  . Dizziness and weakness    Immunizations Administered    Name Date Dose VIS Date Route   Moderna COVID-19 Vaccine 05/05/2019  1:13 PM 0.5 mL 02/21/2019 Intramuscular   Manufacturer: Moderna   Lot: [id number]   [id number]

## 2019-05-08 NOTE — Progress Notes (Deleted)
Cardiology Office Note    Date:  05/08/2019   ID:  Teresa Franklin, DOB Nov 24, 1930, MRN [id number]  PCP:  [name], MD  Cardiologist:  [name], MD  Electrophysiologist:  None   Chief Complaint: Follow-up  History of Present Illness:   Teresa Franklin is a 86 y.o. female with history of CAD with non-STEMI in [id number] complicated by AV block as detailed below, chronic combined systolic and diastolic CHF/ICM, stroke, subdural hematoma in 05/2013 in the setting of a fall, HTN, HLD, hepatic steatosis, diverticulitis, hypothyroidism, chronic thrombocytopenia followed by hematology, osteoarthritis, and osteopenia who presents for follow-up of her CAD and cardiomyopathy.  She was admitted to the hospital in 08/2015 with a non-STEMI.  LHC showed an occluded right posterior AV groove artery that was successfully treated with PCI/DES.  Her case was complicated by hypotension and high-grade AV block with intervention of the right posterior AV groove.  She did require temporary pacing post PCI though did not require PPM.  EF was normal.  There was also noted to be significant disease affecting the proximal and mid LAD which was medically managed in the setting of her frail state and involvement of a large diagonal branch at the site of stenosis.  Her post procedure course was further complicated by stroke confirmed on MRI on 09/15/2015, without residual neurologic deficits.  She had worsening exertional dyspnea in 2018 with nuclear stress test showing no evidence of ischemia.  There was evidence of prior infarct in the lateral wall with an EF of 34%.  Follow-up echo at that time showed an EF of 30 to 35% with severe hypokinesis of the inferior lateral and inferior wall with moderate mitral regurgitation and no evidence of pulmonary hypertension.  She most recently underwent LHC in 09/2017 which showed a widely patent RCA stent with no significant restenosis.  There was moderate proximal to mid LAD disease  estimated to be 60% which actually appeared improved when compared to prior cath.  LVEDP was normal.  She has previously had issues with hypotension requiring de-escalation of carvedilol in the past.  She was last seen in the office in 12-15-2018 after her husband had passed away from a long battle of COPD in 08/2018.  In this setting things were quite stressful.  She noted fatigue at her last follow-up with no angina or dyspnea.  She did note some occasional nausea, with nausea previously noted to be her anginal equivalent.  ***   Labs independently reviewed: 12/2018 - Hgb 14.3, PLT 90 11/2018 - potassium 3.5, BUN 18, serum creatinine 0.74, albumin 4.2, AST/ALT normal, TSH normal 08/2018 - A1c 5.1 11/2017 - TC 170, TG 244, HDL 44, LDL 77   Past Medical History:  Diagnosis Date  . Anxiety   . AV block    a. 08/2015 in setting of NSTEMI and RPAV intervention-->required temp wire but not PPM.  . Back pain   . Chronic combined systolic (congestive) and diastolic (congestive) heart failure ([place])    a. 08/2015  Echo: EF 55-60%, no rwma, mod MR, mildly dil LA/RA, mod TR, PASP 2mmHg; b. 05/2016 Echo: EF 30-35%, inflat/inf sev HK, Gr1 DD; c. 08/2017 Echo: EF 30-35%, mild LVH. Sev inf/infseptal HK. Gr1 DD. Mod MR. mildly dil LA. Mildly reduced RV fxn.  . Coronary artery disease    a. 08/2015 NSTEMI/PCI: LM nl, LAD 75p (med rx), 20 diffuse, 50d, LCX nl, OM1/2 nl, RCA 61m, RPAV 100 (2.25x12 Promus Premier DES) - complicated by 2:1  HB req temp wire post-PCI; b. 04/2016 MV: EF 34%, lateral HK/infarct. No ischemia.  . Diverticulitis   . Generalized anxiety disorder   . Hepatic steatosis   . HLD (hyperlipidemia)   . Hypertension   . Hypokalemia   . Hypomagnesemia   . Hypothyroidism   . Ischemic cardiomyopathy    a. 08/2015  Echo: EF 55-60%; b. 05/2016 Echo: EF 30-35%; c. 08/2017 Echo: EF 30-35%.  . OA (osteoarthritis)   . Osteopenia   . Sleep apnea   . Stroke [facility])    a. 08/2015 Right PCA territory  infarct/temoral lobe cerebral infarction-->MRA showed severe flow liminting stenosis of R P2 cerebral artery;  b. 08/2015 Carotid U/S: 1-39% bilat ICA stenosis.  . Subdural hemorrhage ([place])    a. 05/2013 in setting of fall.  . Thrombocytopenia ([place])   . Weakness     Past Surgical History:  Procedure Laterality Date  . CARDIAC CATHETERIZATION N/A 09/08/2015   Procedure: Temporary Pacemaker;  Surgeon: Leonie Man, MD;  Location: [place] [facility];  Service: Cardiovascular;  Laterality: N/A;  . CARDIAC CATHETERIZATION N/A 09/08/2015   Procedure: Left Heart Cath and Coronary Angiography;  Surgeon: Leonie Man, MD;  Location: [place] [facility];  Service: Cardiovascular;  Laterality: N/A;  . CARDIAC CATHETERIZATION N/A 09/08/2015   Procedure: Coronary Stent Intervention;  Surgeon: Leonie Man, MD;  Location: [place] [facility];  Service: Cardiovascular;  Laterality: N/A;  . CARDIAC CATHETERIZATION  09/08/2015   Procedure: Central Line Insertion;  Surgeon: Leonie Man, MD;  Location: [place] [facility];  Service: Cardiovascular;;  . CARDIAC CATHETERIZATION  09/08/2015   Procedure: Arterial Line Insertion;  Surgeon: Leonie Man, MD;  Location: [place] [facility];  Service: Cardiovascular;;  . CARDIAC CATHETERIZATION N/A 09/09/2015   Procedure: Temporary Wire;  Surgeon: Lorretta Harp, MD;  Location: [place] [facility];  Service: Cardiovascular;  Laterality: N/A;  . ELBOW SURGERY    . LEFT HEART CATH AND CORONARY ANGIOGRAPHY N/A 09/22/2017   Procedure: LEFT HEART CATH AND CORONARY ANGIOGRAPHY;  Surgeon: [name], MD;  Location: [place] [facility];  Service: Cardiovascular;  Laterality: N/A;    Current Medications: No outpatient medications have been marked as taking for the 05/16/19 encounter (Appointment) with Rise Mu, PA-C.    Allergies:   Codeine, Flomax [tamsulosin], Norco [hydrocodone-acetaminophen], and Sulfa antibiotics   Social History   Socioeconomic  History  . Marital status: Widowed    Spouse name: Not on file  . Number of children: Not on file  . Years of education: Not on file  . Highest education level: Not on file  Occupational History  . Not on file  Tobacco Use  . Smoking status: Never Smoker  . Smokeless tobacco: Never Used  Substance and Sexual Activity  . Alcohol use: No    Alcohol/week: 0.0 standard drinks  . Drug use: No  . Sexual activity: Never  Other Topics Concern  . Not on file  Social History Narrative  . Not on file   Social Determinants of Health   Financial Resource Strain:   . Difficulty of Paying Living Expenses: Not on file  Food Insecurity:   . Worried About Charity fundraiser in the Last Year: Not on file  . Ran Out of Food in the Last Year: Not on file  Transportation Needs:   . Lack of Transportation (Medical): Not on file  . Lack of Transportation (Non-Medical): Not on  file  Physical Activity:   . Days of Exercise per Week: Not on file  . Minutes of Exercise per Session: Not on file  Stress:   . Feeling of Stress : Not on file  Social Connections:   . Frequency of Communication with Friends and Family: Not on file  . Frequency of Social Gatherings with Friends and Family: Not on file  . Attends Religious Services: Not on file  . Active Member of Clubs or Organizations: Not on file  . Attends Archivist Meetings: Not on file  . Marital Status: Not on file     Family History:  The patient's family history includes Bipolar disorder in her sister; Diabetes in her sister; Hyperlipidemia in her mother, sister, and sister; Hypertension in her mother and sister; Prostate cancer in her brother; Stroke in her mother and sister. There is no history of Breast cancer.  ROS:   ROS   EKGs/Labs/Other Studies Reviewed:    Studies reviewed were summarized above. The additional studies were reviewed today:  2D echo 08/2017: - Left ventricle: The cavity size was mildly dilated. Wall    thickness was increased in a pattern of mild LVH. Systolic  function was moderately to severely reduced. The estimated  ejection fraction was in the range of 30% to 35%. There is severe  hypokinesis of the inferior and inferoseptal myocardium. Doppler  parameters are consistent with abnormal left ventricular  relaxation (grade 1 diastolic dysfunction).  - Aortic valve: Trileaflet; mildly thickened, mildly calcified  leaflets. Transvalvular velocity was within the normal range.  There was no stenosis. There was no significant regurgitation.  - Mitral valve: Calcified annulus. Mildly thickened leaflets .  There was moderate regurgitation.  - Left atrium: The atrium was mildly dilated.  - Right ventricle: The cavity size was normal. Wall thickness was  mildly increased. Systolic function was mildly reduced. __________  [facility] 09/2017:  Previously placed Post Atrio drug eluting stent is widely patent.  Balloon angioplasty was performed.  Prox LAD to Mid LAD lesion is 50% stenosed.  Ost 1st Diag to 1st Diag lesion is 20% stenosed.  Ost RPDA to RPDA lesion is 60% stenosed.  Prox RCA to Mid RCA lesion is 20% stenosed.   1.  Widely patent RCA stent with no significant restenosis.  Moderate proximal to mid LAD disease estimated to be 60% which seems to be actually better than last time. 2.  Normal ejection fraction by echo.  Left ventricular angiography was not performed.  Normal left ventricular end-diastolic pressure.  Recommendations: Continue medical therapy for coronary artery disease and ischemic cardiomyopathy.  No revascularization is needed.   EKG:  EKG is ordered today.  The EKG ordered today demonstrates ***  Recent Labs: 12/02/2018: ALT 12; BUN 18; Creatinine, Ser 0.74; Potassium 3.5; Sodium 138; TSH 0.883 01/06/2019: Hemoglobin 14.3; Platelets 90  Recent Lipid Panel    Component Value Date/Time   CHOL 114 09/09/2015 0350   TRIG 111 09/09/2015 0350    HDL 38 (L) 09/09/2015 0350   CHOLHDL 3.0 09/09/2015 0350   VLDL 22 09/09/2015 0350   LDLCALC 54 09/09/2015 0350    PHYSICAL EXAM:    VS:  There were no vitals taken for this visit.  BMI: There is no height or weight on file to calculate BMI.  Physical Exam  Wt Readings from Last 3 Encounters:  01/09/19 110 lb (49.9 kg)  12/08/18 109 lb 12.6 oz (49.8 kg)  12/02/18 108 lb 3.9 oz (49.1 kg)  ASSESSMENT & PLAN:   1. ***  Disposition: F/u with Dr. Fletcher Anon or an APP in ***.   Medication Adjustments/Labs and Tests Ordered: Current medicines are reviewed at length with the patient today.  Concerns regarding medicines are outlined above. Medication changes, Labs and Tests ordered today are summarized above and listed in the Patient Instructions accessible in Encounters.   Signed, Christell Faith, PA-C 05/08/2019 11:12 AM     [facility] [address] [place] [address] [place], [place] [address] [phone]

## 2019-05-16 ENCOUNTER — Ambulatory Visit: Payer: Medicare Other | Admitting: Physician Assistant

## 2019-06-05 ENCOUNTER — Ambulatory Visit: Payer: Medicare PPO | Attending: Internal Medicine

## 2019-06-05 DIAGNOSIS — Z23 Encounter for immunization: Secondary | ICD-10-CM

## 2019-06-05 NOTE — Progress Notes (Signed)
   Covid-19 Vaccination Clinic  Name:  Teresa Franklin    MRN: [id number] DOB: 24-Sep-1930  06/05/2019  Teresa Franklin was observed post Covid-19 immunization for 15 minutes without incident. She was provided with Vaccine Information Sheet and instruction to access the V-Safe system.   Teresa Franklin was instructed to call 911 with any severe reactions post vaccine: Marland Kitchen Difficulty breathing  . Swelling of face and throat  . A fast heartbeat  . A bad rash all over body  . Dizziness and weakness   Immunizations Administered    Name Date Dose VIS Date Route   Moderna COVID-19 Vaccine 06/05/2019  1:15 PM 0.5 mL 02/21/2019 Intramuscular   Manufacturer: Moderna   Lot: [id number]   [id number]

## 2019-06-06 ENCOUNTER — Telehealth: Payer: Self-pay

## 2019-06-06 ENCOUNTER — Other Ambulatory Visit: Payer: Self-pay

## 2019-06-06 NOTE — Progress Notes (Signed)
Confirmed Name and DOB. Denies any concerns. Patient reports " I don't understanding why I need to keep going when all my lab work looks good"

## 2019-06-06 NOTE — Progress Notes (Signed)
[facility]  [address] [place], [place] [address] Phone: [phone]  Fax: [phone]   Clinic Day:  06/07/2019  Referring physician: [name], MD  Chief Complaint: Teresa Franklin is a 86 y.o. female with chronic immune mediated thrombocytopenic purpura (ITP) and an IgG monoclonal gammopathy who is seen for 6 month assessment.  HPI: The patient was last seen in the hematology clinic on 12/08/2018. At that time, she denied any excess bruising or bleeding.  Exam revealed no adenopathy or hepatosplenomegaly. Hematocrit was 41.3, hemoglobin 14.4, MCV 93.2, Platelets 106,000, WBC 4,900, and ANC 3,600. SPEP revealed a 0.2 gm/dL IgG monoclonal protein with lambda light chain specificity.   She went to the [facility] on 01/09/2019 for dysuria.  She was diagnosed with a UTI and placed on Keflex.   CBC on 05/10/2019 revealed a hematocrit of 41.9, hemoglobin 13.8, MCV 97, platelets 87,000, and WBC 4500.  Creatinine was 0.8.  Calcium was 10.3 with a protein of 6.4 and an albumin of 3.9.   She is taking supplemental calcium at home, but she is unsure about how much she is taking. She has not had any issues with fevers, sweats, or weight loss. She has always bruised extremely easily.  She notes chronic mild bruising.  She notes that she is health conscious and walks every day.  She takes a folic acid and 123456 pill.  She states that her husband died this year and she is "getting out of the grief syndrome".   Past Medical History:  Diagnosis Date  . Anxiety   . AV block    a. 08/2015 in setting of NSTEMI and RPAV intervention-->required temp wire but not PPM.  . Back pain   . Chronic combined systolic (congestive) and diastolic (congestive) heart failure ([place])    a. 08/2015  Echo: EF 55-60%, no rwma, mod MR, mildly dil LA/RA, mod TR, PASP 30mmHg; b. 05/2016 Echo: EF 30-35%, inflat/inf sev HK, Gr1 DD; c. 08/2017 Echo: EF 30-35%, mild LVH. Sev inf/infseptal  HK. Gr1 DD. Mod MR. mildly dil LA. Mildly reduced RV fxn.  . Coronary artery disease    a. 08/2015 NSTEMI/PCI: LM nl, LAD 75p (med rx), 20 diffuse, 50d, LCX nl, OM1/2 nl, RCA 78m, RPAV 100 (2.25x12 Promus Premier DES) - complicated by 2:1 HB req temp wire post-PCI; b. 04/2016 MV: EF 34%, lateral HK/infarct. No ischemia.  . Diverticulitis   . Generalized anxiety disorder   . Hepatic steatosis   . HLD (hyperlipidemia)   . Hypertension   . Hypokalemia   . Hypomagnesemia   . Hypothyroidism   . Ischemic cardiomyopathy    a. 08/2015  Echo: EF 55-60%; b. 05/2016 Echo: EF 30-35%; c. 08/2017 Echo: EF 30-35%.  . OA (osteoarthritis)   . Osteopenia   . Sleep apnea   . Stroke [facility])    a. 08/2015 Right PCA territory infarct/temoral lobe cerebral infarction-->MRA showed severe flow liminting stenosis of R P2 cerebral artery;  b. 08/2015 Carotid U/S: 1-39% bilat ICA stenosis.  . Subdural hemorrhage ([place])    a. 05/2013 in setting of fall.  . Thrombocytopenia ([place])   . Weakness     Past Surgical History:  Procedure Laterality Date  . CARDIAC CATHETERIZATION N/A 09/08/2015   Procedure: Temporary Pacemaker;  Surgeon: Leonie Man, MD;  Location: [place] [facility];  Service: Cardiovascular;  Laterality: N/A;  . CARDIAC CATHETERIZATION N/A 09/08/2015   Procedure: Left Heart Cath and Coronary Angiography;  Surgeon:  Leonie Man, MD;  Location: [place] [facility];  Service: Cardiovascular;  Laterality: N/A;  . CARDIAC CATHETERIZATION N/A 09/08/2015   Procedure: Coronary Stent Intervention;  Surgeon: Leonie Man, MD;  Location: [place] [facility];  Service: Cardiovascular;  Laterality: N/A;  . CARDIAC CATHETERIZATION  09/08/2015   Procedure: Central Line Insertion;  Surgeon: Leonie Man, MD;  Location: [place] [facility];  Service: Cardiovascular;;  . CARDIAC CATHETERIZATION  09/08/2015   Procedure: Arterial Line Insertion;  Surgeon: Leonie Man, MD;  Location: [place] [facility];  Service:  Cardiovascular;;  . CARDIAC CATHETERIZATION N/A 09/09/2015   Procedure: Temporary Wire;  Surgeon: Lorretta Harp, MD;  Location: [place] [facility];  Service: Cardiovascular;  Laterality: N/A;  . ELBOW SURGERY    . LEFT HEART CATH AND CORONARY ANGIOGRAPHY N/A 09/22/2017   Procedure: LEFT HEART CATH AND CORONARY ANGIOGRAPHY;  Surgeon: [name], MD;  Location: [place] [facility];  Service: Cardiovascular;  Laterality: N/A;    Family History  Problem Relation Age of Onset  . Hypertension Mother   . Hyperlipidemia Mother   . Stroke Mother   . Diabetes Sister   . Hypertension Sister   . Hyperlipidemia Sister   . Bipolar disorder Sister   . Hyperlipidemia Sister   . Stroke Sister   . Prostate cancer Brother   . Breast cancer Neg Hx     Social History:  reports that she has never smoked. She has never used smokeless tobacco. She reports that she does not drink alcohol or use drugs. She is widowed.  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  She lives in [place]. The patient is alone today.  Allergies:  Allergies  Allergen Reactions  . Codeine Nausea And Vomiting  . Flomax [Tamsulosin] Other (See Comments)    Pt states that this medication gave her a kidney infection.    Lebron Quam [Hydrocodone-Acetaminophen] Nausea And Vomiting  . Sulfa Antibiotics Nausea And Vomiting    Current Medications: Current Outpatient Medications  Medication Sig Dispense Refill  . acetaminophen (TYLENOL) 325 MG tablet Take 650 mg by mouth every 6 (six) hours as needed for mild pain, fever or headache.     . ALPRAZolam (XANAX) 0.25 MG tablet Take 1 tablet (0.25 mg total) by mouth at bedtime as needed for anxiety. 15 tablet 0  . aspirin 81 MG chewable tablet Chew 1 tablet (81 mg total) by mouth daily.    Marland Kitchen atorvastatin (LIPITOR) 20 MG tablet Take 1 tablet (20 mg total) by mouth daily. 30 tablet 5  . carvedilol (COREG) 6.25 MG tablet TAKE (1) TABLET BY MOUTH TWICE DAILY [id number] tablet 3  . folic  acid-pyridoxine-cyancobalamin (FOLBIC) 2.5-25-2 MG TABS tablet Take 1 tablet by mouth daily.     . hydroxypropyl methylcellulose / hypromellose (ISOPTO TEARS / GONIOVISC) 2.5 % ophthalmic solution Place 1 drop into both eyes 4 (four) times daily as needed for dry eyes.    Marland Kitchen levothyroxine (SYNTHROID, LEVOTHROID) 25 MCG tablet Take 25 mcg by mouth daily before breakfast.     . lisinopril (PRINIVIL,ZESTRIL) 40 MG tablet Take 20 mg by mouth daily.     . Multiple Vitamins-Minerals (MULTIVITAMIN GUMMIES ADULT) CHEW Chew 2 each by mouth at bedtime.    . Multiple Vitamins-Minerals (PRESERVISION AREDS 2) CAPS Take 1 capsule by mouth daily.    Marland Kitchen omeprazole (PRILOSEC) 20 MG capsule Take 20 mg by mouth daily before breakfast.     . oxybutynin (  DITROPAN) 5 MG tablet Take 1 tablet by mouth 2 (two) times daily.    . polyethylene glycol (MIRALAX / GLYCOLAX) packet Take 17 g by mouth daily as needed for mild constipation. 14 each 0  . Simethicone 80 MG TABS Take 80 mg by mouth daily as needed (gas).      No current facility-administered medications for this visit.    Review of Systems  Constitutional: Negative for chills, diaphoresis, fever, malaise/fatigue and weight loss (up 4 pounds).       Feels "all right".  HENT: Negative.  Negative for congestion, ear discharge, ear pain, hearing loss, nosebleeds, sinus pain and sore throat.   Eyes: Negative.  Negative for blurred vision and double vision.  Respiratory: Negative.  Negative for cough, hemoptysis, sputum production and shortness of breath.   Cardiovascular: Negative.  Negative for chest pain, palpitations and leg swelling.  Gastrointestinal: Negative.  Negative for abdominal pain, blood in stool, constipation, diarrhea, heartburn, melena, nausea and vomiting.  Genitourinary: Negative.  Negative for dysuria, frequency, hematuria and urgency.  Musculoskeletal: Negative.  Negative for back pain, joint pain, myalgias and neck pain.  Skin: Negative.  Negative  for itching and rash.  Neurological: Negative.  Negative for dizziness, tingling, sensory change, speech change, focal weakness, weakness and headaches.  Endo/Heme/Allergies: Bruises/bleeds easily (forearms).  Psychiatric/Behavioral: Negative.  Negative for depression. The patient is not nervous/anxious and does not have insomnia.        Notes "getting out of grief syndrome".  All other systems reviewed and are negative.  Performance status (ECOG): 0  Vitals Blood pressure (!) 122/50, pulse 69, temperature (!) 94.5 F (34.7 C), temperature source Tympanic, resp. rate 18, weight 113 lb (51.3 kg), SpO2 100 %.  Physical Exam  Constitutional: She is oriented to person, place, and time. She appears well-developed and well-nourished. No distress.  HENT:  Head: Normocephalic and atraumatic.  Mouth/Throat: Oropharynx is clear and moist. No oropharyngeal exudate.  Short styled gray hair.  Eyes: Pupils are equal, round, and reactive to light. Conjunctivae and EOM are normal. No scleral icterus.  Glasses.  Blue eyes.  Neck: No JVD present.  Cardiovascular: Normal rate and normal heart sounds. Exam reveals no gallop.  No murmur heard. Pulmonary/Chest: Effort normal and breath sounds normal. No respiratory distress. She has no wheezes. She has no rales.  Abdominal: Soft. Bowel sounds are normal. She exhibits no distension and no mass. There is no hepatosplenomegaly. There is no abdominal tenderness. There is no rebound and no guarding.  Musculoskeletal:        General: No edema. Normal range of motion.     Cervical back: Normal range of motion and neck supple.  Lymphadenopathy:       Head (right side): No preauricular, no posterior auricular and no occipital adenopathy present.       Head (left side): No preauricular, no posterior auricular and no occipital adenopathy present.    She has no cervical adenopathy.    She has no axillary adenopathy.       Right: No inguinal and no supraclavicular  adenopathy present.       Left: No inguinal and no supraclavicular adenopathy present.  Neurological: She is alert and oriented to person, place, and time.  Skin: Skin is warm and dry. No rash noted. She is not diaphoretic. No erythema. No pallor.  Uper extremity bruises.  No petechiae.  Psychiatric: She has a normal mood and affect. Her behavior is normal. Judgment and thought content normal.  Nursing note and vitals reviewed.   Appointment on 06/07/2019  Component Date Value Ref Range Status  . WBC 06/07/2019 3.9* 4.0 - 10.5 K/uL Final  . RBC 06/07/2019 4.22  3.87 - 5.11 MIL/uL Final  . Hemoglobin 06/07/2019 13.4  12.0 - 15.0 g/dL Final  . HCT 06/07/2019 40.4  36.0 - 46.0 % Final  . MCV 06/07/2019 95.7  80.0 - 100.0 fL Final  . MCH 06/07/2019 31.8  26.0 - 34.0 pg Final  . MCHC 06/07/2019 33.2  30.0 - 36.0 g/dL Final  . RDW 06/07/2019 13.6  11.5 - 15.5 % Final  . Platelets 06/07/2019 75* 150 - 400 K/uL Final   Comment: Immature Platelet Fraction may be clinically indicated, consider ordering this additional test [id number]   . nRBC 06/07/2019 0.0  0.0 - 0.2 % Final  . Neutrophils Relative % 06/07/2019 76  % Final  . Neutro Abs 06/07/2019 3.0  1.7 - 7.7 K/uL Final  . Lymphocytes Relative 06/07/2019 13  % Final  . Lymphs Abs 06/07/2019 0.5* 0.7 - 4.0 K/uL Final  . Monocytes Relative 06/07/2019 8  % Final  . Monocytes Absolute 06/07/2019 0.3  0.1 - 1.0 K/uL Final  . Eosinophils Relative 06/07/2019 3  % Final  . Eosinophils Absolute 06/07/2019 0.1  0.0 - 0.5 K/uL Final  . Basophils Relative 06/07/2019 0  % Final  . Basophils Absolute 06/07/2019 0.0  0.0 - 0.1 K/uL Final  . Immature Granulocytes 06/07/2019 0  % Final  . Abs Immature Granulocytes 06/07/2019 0.01  0.00 - 0.07 K/uL Final   Performed at [facility], [address], [place], [place] [address]    Assessment:  Teresa Franklin is a 86 y.o. female with chronic immune mediated thrombocytopenic purpura (ITP).  She  has had intermittent thrombocytopenia since at least 2014.  Platelet count has typically ranged between 98,000 - 116,000.  Platelet count has dipped below 90,000 on 3 occasions (08/2015, 06/2016, and 11/2018).  Abdomen and pelvis CT on 09/05/2015 revealed no evidence of splenomegaly.   Work-up on 11/12/2015 revealed the following normal studies:  B12, hepatitis B core antibody total, hepatitis B surface antigen, hepatitis C antibody, and HIV antibody.  Immunofixation revealed a IgG monoclonal protein with lambda light chain specificity.  Work-up on 12/02/2018 included hematocrit 41.3, hemoglobin 14.4, platelets 106,000, WBC 4,900 with ANC 3600.  Differential was unremarkable.  Platelet count in a citrate tube was 89,000.  Normal studies included:  CMP, ANA, free light chain ratio, folate, PT, PTT, and TSH.  Peripheral smear revealed persistent and stable thrombocytopenia for over a year. RBC, WBC, and platelet morphology was unremarkable.   Platelet count has been followed: 116,000 on 09/05/2015, 116,000 on 09/06/2015, 75,000 on 09/07/2015, 69,000 on 09/08/2015, 116,000 on 09/09/2015, 108,000 on 09/10/2015, 98,000 on 11/12/2015, 94,000 on 12/03/2015, 98,000 on 12/24/2015, 86,000 on 02/04/2016, 112,000 on 05/12/2016, 102,000 on 07/07/2016, 68,000 on 07/10/2016, 97,000 on 04/30/2017, 102,000 on 09/07/2017, 103,000 on 09/08/2017, 108,000 on 09/17/2017, 108,000 on 10/27/2018, 101,000 on 03/08/2018, 94,000 on 08/25/2018, 68,000 on 11/23/2018, 106,000 on 12/02/2018, 90,000 on 01/06/2019, 87,000 on 05/10/2019, and 75,000 on 06/07/2019.   She ha a monoclonal gammopathy of unknown significance (MGUS).  SPEP on 12/02/2018 revealed a 0.2 gm/dL IgG monoclonal protein with lambda light chain specificity.  SPEP has been followed (gm/dL): 0.2 on 12/02/2018 and 0.3 on 06/07/2019.  Symptomatically, he denies any fevers, sweats or weight loss.  Exam reveals no adenopathy or hepatosplenomegaly.  Plan: 1.   Review  labs from [place] on 05/10/2019. 2.   Labs today: CBC with diff, SPEP, hepatitis B core antibody, hepatitis B surface antigen, hepatitis C antibody. 3.   Chronic immune mediated thrombocytopenic purpura (ITP)             Platelet count is 75,000.  Baseline platelet count typically ranges between 98,000 - 116,000.  Patient denies any excess bruising or bleeding.  Etiology may be idiopathic or possibly related to a low grade lympohma given the monoclonal gammopathy.  Treatment options include steroids, IVIG, Rituxan, N-plate, and Promacta.  If treatment needed, patient will undergo bone marrow and CT scans to rule out a lymphoplasmacytic disorder. 4.   Monoclonal gammopathy of unknown significance (MGUS)             Immunofixation on 11/12/2015 revealed an IgG monoclonal protein with lambda light chain specificity.             SPEP on 12/02/2018 was 0.2 gm/dL and 0.3 gm/dL on 06/07/2019.  Courage patient to contact clinic if any symptoms, adenopathy, increased bruising or bleeding or other concerns. 5.   RTC in 6 months for MD assessment, labs (CBC with diff, CMP, LDH, SPEP).  I discussed the assessment and treatment plan with the patient.  The patient was provided an opportunity to ask questions and all were answered.  The patient agreed with the plan and demonstrated an understanding of the instructions.  The patient was advised to call back if the symptoms worsen or if the condition fails to improve as anticipated.  I provided 22 minutes (12:02 PM - 12:24 PM) of face-to-face time during this this encounter and > 50% was spent counseling as documented under my assessment and plan.    Lequita Asal, MD, PhD    06/07/2019, 12:04 PM  I, Jacqualyn Posey, am acting as a Education administrator for [employer]. Mike Gip, MD.   I,  C. Mike Gip, MD, have reviewed the above documentation for accuracy and completeness, and I agree with the above.

## 2019-06-06 NOTE — Telephone Encounter (Signed)
Contacted patient to remind her of her appt. Tomorrow. Patient states she would like to cancel her appt. At this time and will call back should she feel like she needs to be seen. Reports she had extensive lab work completed at her PCP and does not wish to be seen due to everything looking great.

## 2019-06-07 ENCOUNTER — Encounter: Payer: Self-pay | Admitting: Hematology and Oncology

## 2019-06-07 ENCOUNTER — Inpatient Hospital Stay: Payer: Medicare PPO | Attending: Hematology and Oncology | Admitting: Hematology and Oncology

## 2019-06-07 ENCOUNTER — Inpatient Hospital Stay: Payer: Medicare PPO

## 2019-06-07 VITALS — BP 122/50 | HR 69 | Temp 94.5°F | Resp 18 | Wt 113.0 lb

## 2019-06-07 DIAGNOSIS — D472 Monoclonal gammopathy: Secondary | ICD-10-CM

## 2019-06-07 DIAGNOSIS — D693 Immune thrombocytopenic purpura: Secondary | ICD-10-CM

## 2019-06-07 LAB — CBC WITH DIFFERENTIAL/PLATELET
Abs Immature Granulocytes: 0.01 10*3/uL (ref 0.00–0.07)
Basophils Absolute: 0 10*3/uL (ref 0.0–0.1)
Basophils Relative: 0 %
Eosinophils Absolute: 0.1 10*3/uL (ref 0.0–0.5)
Eosinophils Relative: 3 %
HCT: 40.4 % (ref 36.0–46.0)
Hemoglobin: 13.4 g/dL (ref 12.0–15.0)
Immature Granulocytes: 0 %
Lymphocytes Relative: 13 %
Lymphs Abs: 0.5 10*3/uL — ABNORMAL LOW (ref 0.7–4.0)
MCH: 31.8 pg (ref 26.0–34.0)
MCHC: 33.2 g/dL (ref 30.0–36.0)
MCV: 95.7 fL (ref 80.0–100.0)
Monocytes Absolute: 0.3 10*3/uL (ref 0.1–1.0)
Monocytes Relative: 8 %
Neutro Abs: 3 10*3/uL (ref 1.7–7.7)
Neutrophils Relative %: 76 %
Platelets: 75 10*3/uL — ABNORMAL LOW (ref 150–400)
RBC: 4.22 MIL/uL (ref 3.87–5.11)
RDW: 13.6 % (ref 11.5–15.5)
WBC: 3.9 10*3/uL — ABNORMAL LOW (ref 4.0–10.5)
nRBC: 0 % (ref 0.0–0.2)

## 2019-06-07 LAB — HEPATITIS C ANTIBODY: HCV Ab: NONREACTIVE

## 2019-06-07 LAB — HEPATITIS B SURFACE ANTIGEN: Hepatitis B Surface Ag: NONREACTIVE

## 2019-06-07 LAB — HEPATITIS B CORE ANTIBODY, TOTAL: Hep B Core Total Ab: NONREACTIVE

## 2019-06-09 LAB — PROTEIN ELECTROPHORESIS, SERUM
A/G Ratio: 1.7 (ref 0.7–1.7)
Albumin ELP: 4.1 g/dL (ref 2.9–4.4)
Alpha-1-Globulin: 0.3 g/dL (ref 0.0–0.4)
Alpha-2-Globulin: 0.7 g/dL (ref 0.4–1.0)
Beta Globulin: 0.8 g/dL (ref 0.7–1.3)
Gamma Globulin: 0.7 g/dL (ref 0.4–1.8)
Globulin, Total: 2.4 g/dL (ref 2.2–3.9)
M-Spike, %: 0.3 g/dL — ABNORMAL HIGH
Total Protein ELP: 6.5 g/dL (ref 6.0–8.5)

## 2019-09-29 ENCOUNTER — Ambulatory Visit
Admission: EM | Admit: 2019-09-29 | Discharge: 2019-09-29 | Disposition: A | Discharge status: Home / Self Care | Payer: Medicare PPO | Attending: Family Medicine | Admitting: Family Medicine

## 2019-09-29 ENCOUNTER — Other Ambulatory Visit: Payer: Self-pay

## 2019-09-29 DIAGNOSIS — R300 Dysuria: Secondary | ICD-10-CM | POA: Diagnosis present

## 2019-09-29 DIAGNOSIS — R3129 Other microscopic hematuria: Secondary | ICD-10-CM | POA: Insufficient documentation

## 2019-09-29 LAB — URINALYSIS, COMPLETE (UACMP) WITH MICROSCOPIC
Bilirubin Urine: NEGATIVE
Glucose, UA: NEGATIVE mg/dL
Ketones, ur: NEGATIVE mg/dL
Nitrite: NEGATIVE
Protein, ur: 30 mg/dL — AB
Specific Gravity, Urine: 1.025 (ref 1.005–1.030)
WBC, UA: >50 WBC/hpf (ref 0–5)
pH: 7.5 (ref 5.0–8.0)

## 2019-09-29 MED ORDER — NITROFURANTOIN MONOHYD MACRO 100 MG PO CAPS: 100.0000 mg | ORAL_CAPSULE | Freq: Two times a day (BID) | ORAL | 0 refills | Status: AC

## 2019-09-29 NOTE — ED Triage Notes (Signed)
Pt is here with dysuria when urinating that started 3 days ago, pt has not taken anything to relieve discomfort.

## 2019-09-29 NOTE — Discharge Instructions (Addendum)
Recommend start Macrobid 100mg  twice a day as directed for 5 days. Continue to increase fluid intake. Follow-up pending urine culture results and in 2 to 3 days if not improving.

## 2019-09-29 NOTE — ED Provider Notes (Signed)
[facility]    CSN: [id number] Arrival date & time: 09/29/19  0901      History   Chief Complaint Chief Complaint  Patient presents with  . Dysuria    HPI Teresa Franklin is a 86 y.o. female.   86 year old female present with pain with urination for the past 3 days. Having urinary frequency and urgency- especially at night. Was not able to sleep last night due to frequent trips to the bathroom. Denies any fever, abdominal pain, hematuria, or unusual vaginal discharge. Has not taken any medication for symptoms. Last UTI in Oct 2020- was seen here, although patient denies visit. Was positive for E.coli and resistant to most medications- was initially started on Keflex and then changed to [place]. Allergic to Sulfa. Other chronic health issues include Cardiac disease, hyperlipidemia, hypothyroidism, sleep apnea, arthritis, GERD, chronic back pain and arthritis, and anxiety. Currently on Coreg, Lisinopril, Synthroid, Lipitor, aspirin, Prilosec, Ditropan, Tylenol and Xanax daily.   The history is provided by the patient.    Past Medical History:  Diagnosis Date  . Anxiety   . AV block    a. 08/2015 in setting of NSTEMI and RPAV intervention-->required temp wire but not PPM.  . Back pain   . Chronic combined systolic (congestive) and diastolic (congestive) heart failure ([place])    a. 08/2015  Echo: EF 55-60%, no rwma, mod MR, mildly dil LA/RA, mod TR, PASP 36mmHg; b. 05/2016 Echo: EF 30-35%, inflat/inf sev HK, Gr1 DD; c. 08/2017 Echo: EF 30-35%, mild LVH. Sev inf/infseptal HK. Gr1 DD. Mod MR. mildly dil LA. Mildly reduced RV fxn.  . Coronary artery disease    a. 08/2015 NSTEMI/PCI: LM nl, LAD 75p (med rx), 20 diffuse, 50d, LCX nl, OM1/2 nl, RCA 42m, RPAV 100 (2.25x12 Promus Premier DES) - complicated by 2:1 HB req temp wire post-PCI; b. 04/2016 MV: EF 34%, lateral HK/infarct. No ischemia.  . Diverticulitis   . Generalized anxiety disorder   . Hepatic steatosis   . HLD (hyperlipidemia)    . Hypertension   . Hypokalemia   . Hypomagnesemia   . Hypothyroidism   . Ischemic cardiomyopathy    a. 08/2015  Echo: EF 55-60%; b. 05/2016 Echo: EF 30-35%; c. 08/2017 Echo: EF 30-35%.  . OA (osteoarthritis)   . Osteopenia   . Sleep apnea   . Stroke [facility])    a. 08/2015 Right PCA territory infarct/temoral lobe cerebral infarction-->MRA showed severe flow liminting stenosis of R P2 cerebral artery;  b. 08/2015 Carotid U/S: 1-39% bilat ICA stenosis.  . Subdural hemorrhage ([place])    a. 05/2013 in setting of fall.  . Thrombocytopenia ([place])   . Weakness     Patient Active Problem List   Diagnosis Date Noted  . Monoclonal gammopathy 12/02/2018  . Coronary artery disease involving native coronary artery of native heart with angina pectoris ([place])   . Anxiety disorder 08/05/2016  . Preop cardiovascular exam   . Elbow fracture, right 07/07/2016  . Thrombocytopenia ([place]) 11/12/2015  . Diastolic CHF, acute on chronic ([place]) 09/17/2015  . SOB (shortness of breath)   . UTI (urinary tract infection) 09/16/2015  . Acute right MCA stroke ([place])   . Fall   . Hypokalemia 09/13/2015  . Hypophosphatemia 09/12/2015  . Encounter for intubation   . Acute respiratory failure ([place])   . Complete heart block ([place]) 09/08/2015  . Acute blood loss anemia -thought to be related to in her muscular injection of Phenergan 09/08/2015  .  Acute respiratory failure with hypoxia ([place]) 09/08/2015  . Heart block AV second degree   . Essential hypertension   . Arterial hypotension   . Cardiogenic shock ([place])   . NSTEMI (non-ST elevated myocardial infarction) ([place]) 09/06/2015  . Subdural hemorrhage ([facility]) -history of     Class: History of  . Hyperlipidemia with target LDL less than 70   . Hepatic steatosis     Past Surgical History:  Procedure Laterality Date  . CARDIAC CATHETERIZATION N/A 09/08/2015   Procedure: Temporary Pacemaker;  Surgeon: Leonie Man, MD;  Location: [facility];  Service:  Cardiovascular;  Laterality: N/A;  . CARDIAC CATHETERIZATION N/A 09/08/2015   Procedure: Left Heart Cath and Coronary Angiography;  Surgeon: Leonie Man, MD;  Location: [facility];  Service: Cardiovascular;  Laterality: N/A;  . CARDIAC CATHETERIZATION N/A 09/08/2015   Procedure: Coronary Stent Intervention;  Surgeon: Leonie Man, MD;  Location: [place];  Service: Cardiovascular;  Laterality: N/A;  . CARDIAC CATHETERIZATION  09/08/2015   Procedure: Central Line Insertion;  Surgeon: Leonie Man, MD;  Location: [facility];  Service: Cardiovascular;;  . CARDIAC CATHETERIZATION  09/08/2015   Procedure: Arterial Line Insertion;  Surgeon: Leonie Man, MD;  Location: [facility];  Service: Cardiovascular;;  . CARDIAC CATHETERIZATION N/A 09/09/2015   Procedure: Temporary Wire;  Surgeon: Lorretta Harp, MD;  Location: [facility];  Service: Cardiovascular;  Laterality: N/A;  . ELBOW SURGERY    . LEFT HEART CATH AND CORONARY ANGIOGRAPHY N/A 09/22/2017   Procedure: LEFT HEART CATH AND CORONARY ANGIOGRAPHY;  Surgeon: [name], MD;  Location: [facility];  Service: Cardiovascular;  Laterality: N/A;    OB History   No obstetric history on file.      Home Medications    Prior to Admission medications   Medication Sig Start Date End Date Taking? Authorizing Provider  acetaminophen (TYLENOL) 325 MG tablet Take 650 mg by mouth every 6 (six) hours as needed for mild pain, fever or headache.     [provider]  ALPRAZolam Duanne Moron) 0.25 MG tablet Take 1 tablet (0.25 mg total) by mouth at bedtime as needed for anxiety. 07/10/16   Gladstone Lighter, MD  aspirin 81 MG chewable tablet Chew 1 tablet (81 mg total) by mouth daily. 09/13/15   Lyda Jester M, PA-C  atorvastatin (LIPITOR) 20 MG tablet Take 1 tablet (20 mg total) by mouth daily. 09/30/15   [name], MD  calcium-vitamin D 250-100 MG-UNIT tablet Take 1 tablet by  mouth 2 (two) times daily.    [provider]  carvedilol (COREG) 6.25 MG tablet TAKE (1) TABLET BY MOUTH TWICE DAILY 10/17/18   Rise Mu, PA-C  folic acid-pyridoxine-cyancobalamin (FOLBIC) 2.5-25-2 MG TABS tablet Take 1 tablet by mouth daily.  12/13/18   [provider]  hydroxypropyl methylcellulose / hypromellose (ISOPTO TEARS / GONIOVISC) 2.5 % ophthalmic solution Place 1 drop into both eyes 4 (four) times daily as needed for dry eyes.    [provider]  levothyroxine (SYNTHROID, LEVOTHROID) 25 MCG tablet Take 25 mcg by mouth daily before breakfast.     [provider]  lisinopril (PRINIVIL,ZESTRIL) 40 MG tablet Take 20 mg by mouth daily.     [provider]  Multiple Vitamins-Minerals (MULTIVITAMIN GUMMIES ADULT) CHEW Chew 2 each by mouth at bedtime.    [provider]  Multiple Vitamins-Minerals (PRESERVISION AREDS 2) CAPS  Take 1 capsule by mouth daily.    [provider]  nitrofurantoin, macrocrystal-monohydrate, (MACROBID) 100 MG capsule Take 1 capsule (100 mg total) by mouth 2 (two) times daily for 5 days. 09/29/19 10/04/19  Katy Apo, NP  omeprazole (PRILOSEC) 20 MG capsule Take 20 mg by mouth daily before breakfast.     [provider]  oxybutynin (DITROPAN) 5 MG tablet Take 1 tablet by mouth 2 (two) times daily. 05/23/19   [provider]  polyethylene glycol (MIRALAX / GLYCOLAX) packet Take 17 g by mouth daily as needed for mild constipation. 07/10/16   Gladstone Lighter, MD  Simethicone 80 MG TABS Take 80 mg by mouth daily as needed (gas).     [provider]  nitroGLYCERIN (NITROSTAT) 0.4 MG SL tablet Place 1 tablet (0.4 mg total) under the tongue every 5 (five) minutes x 3 doses as needed for chest pain. Patient not taking: Reported on 12/02/2018 09/13/15 01/09/19  Consuelo Pandy, PA-C    Family History Family History  Problem Relation Age of Onset  . Hypertension Mother   .  Hyperlipidemia Mother   . Stroke Mother   . Diabetes Sister   . Hypertension Sister   . Hyperlipidemia Sister   . Bipolar disorder Sister   . Hyperlipidemia Sister   . Stroke Sister   . Prostate cancer Brother   . Breast cancer Neg Hx     Social History Social History   Tobacco Use  . Smoking status: Never Smoker  . Smokeless tobacco: Never Used  Substance Use Topics  . Alcohol use: No    Alcohol/week: 0.0 standard drinks  . Drug use: No     Allergies   Codeine, Flomax [tamsulosin], Norco [hydrocodone-acetaminophen], and Sulfa antibiotics   Review of Systems Review of Systems  Constitutional: Positive for fatigue. Negative for activity change, appetite change, chills and fever.  Gastrointestinal: Negative for abdominal pain, diarrhea, nausea and vomiting.  Genitourinary: Positive for decreased urine volume, dysuria, frequency and urgency. Negative for difficulty urinating, flank pain, hematuria and vaginal discharge.  Musculoskeletal: Positive for arthralgias, back pain and myalgias.  Skin: Negative for color change, rash and wound.  Allergic/Immunologic: Negative for environmental allergies and food allergies.  Neurological: Negative for seizures, syncope, weakness and headaches.  Hematological: Negative for adenopathy. Bruises/bleeds easily.  Psychiatric/Behavioral: Positive for sleep disturbance.     Physical Exam Triage Vital Signs ED Triage Vitals  Enc Vitals Group     BP 09/29/19 0912 132/74     Pulse Rate 09/29/19 0912 83     Resp 09/29/19 0912 16     Temp 09/29/19 0912 98.8 F (37.1 C)     Temp Source 09/29/19 0912 Oral     SpO2 09/29/19 0912 100 %     Weight 09/29/19 0916 110 lb (49.9 kg)     Height --      Head Circumference --      Peak Flow --      Pain Score 09/29/19 0911 0     Pain Loc --      Pain Edu? --      Excl. in [place]? --    No data found.  Updated Vital Signs BP 132/74 (BP Location: Right Arm)   Pulse 83   Temp 98.8 F (37.1 C)  (Oral)   Resp 16   Wt 110 lb (49.9 kg)   SpO2 100%   BMI 20.12 kg/m   Visual Acuity Right Eye Distance:   Left Eye  Distance:   Bilateral Distance:    Right Eye Near:   Left Eye Near:    Bilateral Near:     Physical Exam Vitals and nursing note reviewed.  Constitutional:      General: She is awake. She is not in acute distress.    Appearance: She is well-developed and well-groomed. She is not ill-appearing.     Comments: She is sitting comfortably in the exam chair in no acute distress.   HENT:     Head: Normocephalic and atraumatic.  Cardiovascular:     Rate and Rhythm: Normal rate and regular rhythm.  Pulmonary:     Effort: Pulmonary effort is normal. No respiratory distress.     Breath sounds: Normal breath sounds and air entry. No decreased breath sounds, wheezing or rhonchi.  Abdominal:     General: Abdomen is flat. Bowel sounds are normal.     Palpations: Abdomen is soft.     Tenderness: There is abdominal tenderness in the suprapubic area. There is no right CVA tenderness, left CVA tenderness, guarding or rebound.    Musculoskeletal:     Cervical back: Normal range of motion.  Skin:    General: Skin is warm and dry.     Findings: No rash.  Neurological:     General: No focal deficit present.     Mental Status: She is alert and oriented to person, place, and time.  Psychiatric:        Attention and Perception: Attention normal.        Mood and Affect: Mood normal.        Speech: Speech normal.        Behavior: Behavior normal. Behavior is cooperative.        Thought Content: Thought content normal.        Cognition and Memory: Memory is impaired.      UC Treatments / Results  Labs (all labs ordered are listed, but only abnormal results are displayed) Labs Reviewed  URINALYSIS, COMPLETE (UACMP) WITH MICROSCOPIC - Abnormal; Notable for the following components:      Result Value   APPearance CLOUDY (*)    Hgb urine dipstick LARGE (*)    Protein, ur 30  (*)    Leukocytes,Ua MODERATE (*)    Bacteria, UA FEW (*)    All other components within normal limits  URINE CULTURE    EKG   Radiology No results found.  Procedures Procedures (including critical care time)  Medications Ordered in UC Medications - No data to display  Initial Impression / Assessment and Plan / UC Course  I have reviewed the triage vital signs and the nursing notes.  Pertinent labs & imaging results that were available during my care of the patient were reviewed by me and considered in my medical decision making (see chart for details).    Reviewed urinalysis results with patient- positive blood, protein, WBC's and bacteria- probable UTI. Since last UTI was resistant to many medications and she is allergic to Sulfa, will retry Macrobid 100mg  twice a day as directed. Urine sent for culture. Encouraged to increase water consumption to help with symptoms. Follow-up pending urine culture results and in 2 to 3 days if not improving.   Final Clinical Impressions(s) / UC Diagnoses   Final diagnoses:  Dysuria  Other microscopic hematuria     Discharge Instructions     Recommend start Macrobid 100mg  twice a day as directed for 5 days. Continue to increase fluid intake. Follow-up pending  urine culture results and in 2 to 3 days if not improving.     ED Prescriptions    Medication Sig Dispense Auth. Provider   nitrofurantoin, macrocrystal-monohydrate, (MACROBID) 100 MG capsule Take 1 capsule (100 mg total) by mouth 2 (two) times daily for 5 days. 10 capsule Elizandro Laura, Nicholes Stairs, NP     PDMP not reviewed this encounter.   Katy Apo, NP 09/30/19 [phone]

## 2019-09-30 LAB — URINE CULTURE

## 2019-10-05 ENCOUNTER — Ambulatory Visit
Admission: EM | Admit: 2019-10-05 | Discharge: 2019-10-05 | Disposition: A | Discharge status: Home / Self Care | Payer: Medicare PPO | Attending: Family Medicine | Admitting: Family Medicine

## 2019-10-05 ENCOUNTER — Encounter: Payer: Self-pay | Admitting: Emergency Medicine

## 2019-10-05 ENCOUNTER — Other Ambulatory Visit: Payer: Self-pay

## 2019-10-05 DIAGNOSIS — R300 Dysuria: Secondary | ICD-10-CM | POA: Insufficient documentation

## 2019-10-05 LAB — BASIC METABOLIC PANEL
Anion gap: 5 (ref 5–15)
BUN: 13 mg/dL (ref 8–23)
CO2: 30 mmol/L (ref 22–32)
Calcium: 9.4 mg/dL (ref 8.9–10.3)
Chloride: 103 mmol/L (ref 98–111)
Creatinine, Ser: 0.87 mg/dL (ref 0.44–1.00)
GFR calc Af Amer: >60 mL/min (ref >60)
GFR calc non Af Amer: >60 mL/min (ref >60)
Glucose, Bld: 105 mg/dL — ABNORMAL HIGH (ref 70–99)
Potassium: 3.9 mmol/L (ref 3.5–5.1)
Sodium: 138 mmol/L (ref 135–145)

## 2019-10-05 LAB — CBC WITH DIFFERENTIAL/PLATELET
Abs Immature Granulocytes: 0.01 10*3/uL (ref 0.00–0.07)
Basophils Absolute: 0 10*3/uL (ref 0.0–0.1)
Basophils Relative: 0 %
Eosinophils Absolute: 0.1 10*3/uL (ref 0.0–0.5)
Eosinophils Relative: 3 %
HCT: 38.4 % (ref 36.0–46.0)
Hemoglobin: 13.3 g/dL (ref 12.0–15.0)
Immature Granulocytes: 0 %
Lymphocytes Relative: 26 %
Lymphs Abs: 1.2 10*3/uL (ref 0.7–4.0)
MCH: 31.9 pg (ref 26.0–34.0)
MCHC: 34.6 g/dL (ref 30.0–36.0)
MCV: 92.1 fL (ref 80.0–100.0)
Monocytes Absolute: 0.4 10*3/uL (ref 0.1–1.0)
Monocytes Relative: 8 %
Neutro Abs: 3 10*3/uL (ref 1.7–7.7)
Neutrophils Relative %: 63 %
Platelets: 86 10*3/uL — ABNORMAL LOW (ref 150–400)
RBC Morphology: NONE SEEN
RBC: 4.17 MIL/uL (ref 3.87–5.11)
RDW: 13.6 % (ref 11.5–15.5)
WBC: 4.7 10*3/uL (ref 4.0–10.5)
nRBC: 0 % (ref 0.0–0.2)

## 2019-10-05 LAB — URINALYSIS, COMPLETE (UACMP) WITH MICROSCOPIC
Bilirubin Urine: NEGATIVE
Glucose, UA: NEGATIVE mg/dL
Leukocytes,Ua: NEGATIVE
Nitrite: NEGATIVE
Protein, ur: 30 mg/dL — AB
Specific Gravity, Urine: 1.025 (ref 1.005–1.030)
pH: 5 (ref 5.0–8.0)

## 2019-10-05 NOTE — ED Triage Notes (Signed)
Patient c/o dysuria that started 6 days ago. She states she was seen here on Monday and started an antibiotic on Monday and completed this last night. She states she does not feel any better.

## 2019-10-05 NOTE — Discharge Instructions (Signed)
I will call with the lab results.  Call Dr. Hoy Morn for a follow up appt.  Take care  Dr. Lacinda Axon

## 2019-10-05 NOTE — ED Provider Notes (Signed)
[facility]    CSN: [id number] Arrival date & time: 10/05/19  1608      History   Chief Complaint Chief Complaint  Patient presents with  . Dysuria   HPI  86 year old female presents with persistent dysuria.  Patient reports persistent dysuria.  She states that it is mild currently.  She was seen recently on 7/9 and was placed on empiric Macrobid while awaiting culture.  Culture has returned and revealed multiple bacteria species and suggested recollection.  Patient states that she has finished her antibiotic course and still has the dysuria.  She denies urinary urgency and frequency as she takes medication for overactive bladder.  No abdominal pain.  No fever.  Patient does state that she just does not feel well.  No other complaints at this time.  Past Medical History:  Diagnosis Date  . Anxiety   . AV block    a. 08/2015 in setting of NSTEMI and RPAV intervention-->required temp wire but not PPM.  . Back pain   . Chronic combined systolic (congestive) and diastolic (congestive) heart failure ([place])    a. 08/2015  Echo: EF 55-60%, no rwma, mod MR, mildly dil LA/RA, mod TR, PASP 72mmHg; b. 05/2016 Echo: EF 30-35%, inflat/inf sev HK, Gr1 DD; c. 08/2017 Echo: EF 30-35%, mild LVH. Sev inf/infseptal HK. Gr1 DD. Mod MR. mildly dil LA. Mildly reduced RV fxn.  . Coronary artery disease    a. 08/2015 NSTEMI/PCI: LM nl, LAD 75p (med rx), 20 diffuse, 50d, LCX nl, OM1/2 nl, RCA 77m, RPAV 100 (2.25x12 Promus Premier DES) - complicated by 2:1 HB req temp wire post-PCI; b. 04/2016 MV: EF 34%, lateral HK/infarct. No ischemia.  . Diverticulitis   . Generalized anxiety disorder   . Hepatic steatosis   . HLD (hyperlipidemia)   . Hypertension   . Hypokalemia   . Hypomagnesemia   . Hypothyroidism   . Ischemic cardiomyopathy    a. 08/2015  Echo: EF 55-60%; b. 05/2016 Echo: EF 30-35%; c. 08/2017 Echo: EF 30-35%.  . OA (osteoarthritis)   . Osteopenia   . Sleep apnea   . Stroke [facility])    a.  08/2015 Right PCA territory infarct/temoral lobe cerebral infarction-->MRA showed severe flow liminting stenosis of R P2 cerebral artery;  b. 08/2015 Carotid U/S: 1-39% bilat ICA stenosis.  . Subdural hemorrhage ([place])    a. 05/2013 in setting of fall.  . Thrombocytopenia ([place])   . Weakness     Patient Active Problem List   Diagnosis Date Noted  . Monoclonal gammopathy 12/02/2018  . Coronary artery disease involving native coronary artery of native heart with angina pectoris ([place])   . Anxiety disorder 08/05/2016  . Preop cardiovascular exam   . Elbow fracture, right 07/07/2016  . Thrombocytopenia ([place]) 11/12/2015  . Diastolic CHF, acute on chronic ([place]) 09/17/2015  . SOB (shortness of breath)   . UTI (urinary tract infection) 09/16/2015  . Acute right MCA stroke ([place])   . Fall   . Hypokalemia 09/13/2015  . Hypophosphatemia 09/12/2015  . Encounter for intubation   . Acute respiratory failure ([place])   . Complete heart block ([place]) 09/08/2015  . Acute blood loss anemia -thought to be related to in her muscular injection of Phenergan 09/08/2015  . Acute respiratory failure with hypoxia ([place]) 09/08/2015  . Heart block AV second degree   . Essential hypertension   . Arterial hypotension   . Cardiogenic shock ([place])   . NSTEMI (non-ST elevated myocardial infarction) ([place]) 09/06/2015  .  Subdural hemorrhage ([facility]) -history of     Class: History of  . Hyperlipidemia with target LDL less than 70   . Hepatic steatosis     Past Surgical History:  Procedure Laterality Date  . CARDIAC CATHETERIZATION N/A 09/08/2015   Procedure: Temporary Pacemaker;  Surgeon: Leonie Man, MD;  Location: [facility];  Service: Cardiovascular;  Laterality: N/A;  . CARDIAC CATHETERIZATION N/A 09/08/2015   Procedure: Left Heart Cath and Coronary Angiography;  Surgeon: Leonie Man, MD;  Location: [facility];  Service: Cardiovascular;  Laterality: N/A;  . CARDIAC CATHETERIZATION N/A 09/08/2015    Procedure: Coronary Stent Intervention;  Surgeon: Leonie Man, MD;  Location: [facility];  Service: Cardiovascular;  Laterality: N/A;  . CARDIAC CATHETERIZATION  09/08/2015   Procedure: Central Line Insertion;  Surgeon: Leonie Man, MD;  Location: [facility];  Service: Cardiovascular;;  . CARDIAC CATHETERIZATION  09/08/2015   Procedure: Arterial Line Insertion;  Surgeon: Leonie Man, MD;  Location: [facility];  Service: Cardiovascular;;  . CARDIAC CATHETERIZATION N/A 09/09/2015   Procedure: Temporary Wire;  Surgeon: Lorretta Harp, MD;  Location: [facility];  Service: Cardiovascular;  Laterality: N/A;  . ELBOW SURGERY    . LEFT HEART CATH AND CORONARY ANGIOGRAPHY N/A 09/22/2017   Procedure: LEFT HEART CATH AND CORONARY ANGIOGRAPHY;  Surgeon: [name], MD;  Location: [facility];  Service: Cardiovascular;  Laterality: N/A;    OB History   No obstetric history on file.      Home Medications    Prior to Admission medications   Medication Sig Start Date End Date Taking? Authorizing Provider  acetaminophen (TYLENOL) 325 MG tablet Take 650 mg by mouth every 6 (six) hours as needed for mild pain, fever or headache.    Yes [provider]  ALPRAZolam (XANAX) 0.25 MG tablet Take 1 tablet (0.25 mg total) by mouth at bedtime as needed for anxiety. 07/10/16  Yes Gladstone Lighter, MD  aspirin 81 MG chewable tablet Chew 1 tablet (81 mg total) by mouth daily. 09/13/15  Yes Lyda Jester M, PA-C  atorvastatin (LIPITOR) 20 MG tablet Take 1 tablet (20 mg total) by mouth daily. 09/30/15  Yes [name], MD  calcium-vitamin D 250-100 MG-UNIT tablet Take 1 tablet by mouth 2 (two) times daily.   Yes [provider]  carvedilol (COREG) 6.25 MG tablet TAKE (1) TABLET BY MOUTH TWICE DAILY 10/17/18  Yes Dunn, Areta Haber, PA-C  folic acid-pyridoxine-cyancobalamin (FOLBIC) 2.5-25-2 MG TABS tablet Take 1 tablet by mouth daily.  12/13/18   Yes [provider]  hydroxypropyl methylcellulose / hypromellose (ISOPTO TEARS / GONIOVISC) 2.5 % ophthalmic solution Place 1 drop into both eyes 4 (four) times daily as needed for dry eyes.   Yes [provider]  levothyroxine (SYNTHROID, LEVOTHROID) 25 MCG tablet Take 25 mcg by mouth daily before breakfast.    Yes [provider]  lisinopril (PRINIVIL,ZESTRIL) 40 MG tablet Take 20 mg by mouth daily.    Yes [provider]  Multiple Vitamins-Minerals (MULTIVITAMIN GUMMIES ADULT) CHEW Chew 2 each by mouth at bedtime.   Yes [provider]  Multiple Vitamins-Minerals (PRESERVISION AREDS 2) CAPS Take 1 capsule by mouth daily.   Yes [provider]  omeprazole (PRILOSEC) 20 MG capsule Take 20 mg by mouth daily before breakfast.    Yes [provider]  oxybutynin (DITROPAN) 5 MG tablet Take 1 tablet  by mouth 2 (two) times daily. 05/23/19  Yes [provider]  polyethylene glycol (MIRALAX / GLYCOLAX) packet Take 17 g by mouth daily as needed for mild constipation. 07/10/16  Yes Gladstone Lighter, MD  Simethicone 80 MG TABS Take 80 mg by mouth daily as needed (gas).    Yes [provider]  nitroGLYCERIN (NITROSTAT) 0.4 MG SL tablet Place 1 tablet (0.4 mg total) under the tongue every 5 (five) minutes x 3 doses as needed for chest pain. Patient not taking: Reported on 12/02/2018 09/13/15 01/09/19  Consuelo Pandy, PA-C    Family History Family History  Problem Relation Age of Onset  . Hypertension Mother   . Hyperlipidemia Mother   . Stroke Mother   . Diabetes Sister   . Hypertension Sister   . Hyperlipidemia Sister   . Bipolar disorder Sister   . Hyperlipidemia Sister   . Stroke Sister   . Prostate cancer Brother   . Breast cancer Neg Hx     Social History Social History   Tobacco Use  . Smoking status: Never Smoker  . Smokeless tobacco: Never Used  Substance Use Topics  . Alcohol use: No     Alcohol/week: 0.0 standard drinks  . Drug use: No     Allergies   Codeine, Flomax [tamsulosin], Norco [hydrocodone-acetaminophen], and Sulfa antibiotics   Review of Systems Review of Systems  Constitutional: Negative for fever.  Gastrointestinal: Negative for abdominal pain.  Genitourinary: Positive for dysuria. Negative for hematuria.   Physical Exam Triage Vital Signs ED Triage Vitals [10/05/19 1614]  Enc Vitals Group     BP (!) 133/108     Pulse Rate 73     Resp 18     Temp 98.5 F (36.9 C)     Temp Source Oral     SpO2 100 %     Weight 110 lb 0.2 oz (49.9 kg)     Height 5\' 2"  (1.575 m)     Head Circumference      Peak Flow      Pain Score 8     Pain Loc      Pain Edu?      Excl. in [place]?    Updated Vital Signs BP (!) 133/108 (BP Location: Right Arm)   Pulse 73   Temp 98.5 F (36.9 C) (Oral)   Resp 18   Ht 5\' 2"  (1.575 m)   Wt 49.9 kg   SpO2 100%   BMI 20.12 kg/m   Visual Acuity Right Eye Distance:   Left Eye Distance:   Bilateral Distance:    Right Eye Near:   Left Eye Near:    Bilateral Near:     Physical Exam Vitals and nursing note reviewed.  Constitutional:      Comments: Frail elderly female in no acute distress.  HENT:     Head: Normocephalic and atraumatic.  Eyes:     General:        Right eye: No discharge.        Left eye: No discharge.     Conjunctiva/sclera: Conjunctivae normal.  Cardiovascular:     Rate and Rhythm: Normal rate and regular rhythm.  Pulmonary:     Effort: Pulmonary effort is normal. No respiratory distress.  Abdominal:     General: There is no distension.     Palpations: Abdomen is soft.     Tenderness: There is no abdominal tenderness.  Neurological:     Mental Status: She is alert.  Psychiatric:        Mood and Affect: Mood normal.        Behavior: Behavior normal.    UC Treatments / Results  Labs (all labs ordered are listed, but only abnormal results are displayed) Labs Reviewed  URINALYSIS,  COMPLETE (UACMP) WITH MICROSCOPIC - Abnormal; Notable for the following components:      Result Value   APPearance HAZY (*)    Hgb urine dipstick SMALL (*)    Ketones, ur TRACE (*)    Protein, ur 30 (*)    Bacteria, UA FEW (*)    All other components within normal limits  CBC WITH DIFFERENTIAL/PLATELET - Abnormal; Notable for the following components:   Platelets 86 (*)    All other components within normal limits  BASIC METABOLIC PANEL - Abnormal; Notable for the following components:   Glucose, Bld 105 (*)    All other components within normal limits  URINE CULTURE    EKG   Radiology No results found.  Procedures Procedures (including critical care time)  Medications Ordered in UC Medications - No data to display  Initial Impression / Assessment and Plan / UC Course  I have reviewed the triage vital signs and the nursing notes.  Pertinent labs & imaging results that were available during my care of the patient were reviewed by me and considered in my medical decision making (see chart for details).    86 year old female presents with persistent dysuria.  Patient recently completed antibiotic course and is still having symptoms.  Patient also states that she feels poorly.  Patient does have pyuria on microscopy but she gave a sample with 11-20 squamous epithelial cells.  Sending culture.  Obtaining CBC and BMP today given the fact that she feels poorly in the setting of multiple medical comorbidities.  BMP unremarkable.  CBC notable for thrombocytopenia but this is chronic.  Awaiting urine culture.  Follow-up with PCP.  Final Clinical Impressions(s) / UC Diagnoses   Final diagnoses:  Dysuria     Discharge Instructions     I will call with the lab results.  Call Dr. Hoy Morn for a follow up appt.  Take care  Dr. Lacinda Axon     ED Prescriptions    None     PDMP not reviewed this encounter.   Coral Spikes, [place] 10/05/19 1805

## 2019-10-07 LAB — URINE CULTURE

## 2019-10-30 ENCOUNTER — Other Ambulatory Visit: Payer: Self-pay | Admitting: Family Medicine

## 2019-10-30 DIAGNOSIS — Z1231 Encounter for screening mammogram for malignant neoplasm of breast: Secondary | ICD-10-CM

## 2019-12-05 NOTE — Progress Notes (Signed)
[facility]  [address], [address] [place], [place] [address] Phone: [phone]  Fax: [phone]   Clinic Day:  12/06/2019  Referring physician: [name], MD  Chief Complaint: Teresa Franklin is a 86 y.o. female with chronic immune mediated thrombocytopenic purpura (ITP) and an IgG monoclonal gammopathy who is seen for 6 month assessment.  HPI: The patient was last seen in the hematology clinic on 06/07/2019. At that time, she denied any fevers, sweats or weight loss.  Exam revealed no adenopathy or hepatosplenomegaly. Hematocrit was 40.4, hemoglobin 13.4, platelets 75,000, WBC 3,900 (ANC 3,000). M spike was 0.3 gm/dL. Hepatitis B surface antigen, hepatitis B core antibody, and hepatitis C antibody were non reactive.  The patient is scheduled for a mammogram on 12/12/2019.  During the interim, she has been "fine." She reports burning with urination that started this morning. She got a bug bit her hip while she was in the garden this morning, which "burns like fire." She bruises easily, but this is normal for her. She reports occasional diarrhea.  She states that she has been "going through a natural grief process" since her husband died last year. She has been unable to gain weight since then.   Past Medical History:  Diagnosis Date  . Anxiety   . AV block    a. 08/2015 in setting of NSTEMI and RPAV intervention-->required temp wire but not PPM.  . Back pain   . Chronic combined systolic (congestive) and diastolic (congestive) heart failure ([place])    a. 08/2015  Echo: EF 55-60%, no rwma, mod MR, mildly dil LA/RA, mod TR, PASP 55mmHg; b. 05/2016 Echo: EF 30-35%, inflat/inf sev HK, Gr1 DD; c. 08/2017 Echo: EF 30-35%, mild LVH. Sev inf/infseptal HK. Gr1 DD. Mod MR. mildly dil LA. Mildly reduced RV fxn.  . Coronary artery disease    a. 08/2015 NSTEMI/PCI: LM nl, LAD 75p (med rx), 20 diffuse, 50d, LCX nl, OM1/2 nl, RCA 41m, RPAV 100 (2.25x12 Promus Premier DES) -  complicated by 2:1 HB req temp wire post-PCI; b. 04/2016 MV: EF 34%, lateral HK/infarct. No ischemia.  . Diverticulitis   . Generalized anxiety disorder   . Hepatic steatosis   . HLD (hyperlipidemia)   . Hypertension   . Hypokalemia   . Hypomagnesemia   . Hypothyroidism   . Ischemic cardiomyopathy    a. 08/2015  Echo: EF 55-60%; b. 05/2016 Echo: EF 30-35%; c. 08/2017 Echo: EF 30-35%.  . OA (osteoarthritis)   . Osteopenia   . Sleep apnea   . Stroke [facility])    a. 08/2015 Right PCA territory infarct/temoral lobe cerebral infarction-->MRA showed severe flow liminting stenosis of R P2 cerebral artery;  b. 08/2015 Carotid U/S: 1-39% bilat ICA stenosis.  . Subdural hemorrhage ([place])    a. 05/2013 in setting of fall.  . Thrombocytopenia ([place])   . Weakness     Past Surgical History:  Procedure Laterality Date  . CARDIAC CATHETERIZATION N/A 09/08/2015   Procedure: Temporary Pacemaker;  Surgeon: Leonie Man, MD;  Location: [place] [facility];  Service: Cardiovascular;  Laterality: N/A;  . CARDIAC CATHETERIZATION N/A 09/08/2015   Procedure: Left Heart Cath and Coronary Angiography;  Surgeon: Leonie Man, MD;  Location: [place] [facility];  Service: Cardiovascular;  Laterality: N/A;  . CARDIAC CATHETERIZATION N/A 09/08/2015   Procedure: Coronary Stent Intervention;  Surgeon: Leonie Man, MD;  Location: [place] [facility];  Service: Cardiovascular;  Laterality: N/A;  . CARDIAC CATHETERIZATION  09/08/2015   Procedure: Central Line Insertion;  Surgeon: Leonie Man, MD;  Location: [place] [facility];  Service: Cardiovascular;;  . CARDIAC CATHETERIZATION  09/08/2015   Procedure: Arterial Line Insertion;  Surgeon: Leonie Man, MD;  Location: [place] [facility];  Service: Cardiovascular;;  . CARDIAC CATHETERIZATION N/A 09/09/2015   Procedure: Temporary Wire;  Surgeon: Lorretta Harp, MD;  Location: [place] [facility];  Service: Cardiovascular;  Laterality: N/A;  . ELBOW SURGERY    . LEFT HEART  CATH AND CORONARY ANGIOGRAPHY N/A 09/22/2017   Procedure: LEFT HEART CATH AND CORONARY ANGIOGRAPHY;  Surgeon: [name], MD;  Location: [place] [facility];  Service: Cardiovascular;  Laterality: N/A;    Family History  Problem Relation Age of Onset  . Hypertension Mother   . Hyperlipidemia Mother   . Stroke Mother   . Diabetes Sister   . Hypertension Sister   . Hyperlipidemia Sister   . Bipolar disorder Sister   . Hyperlipidemia Sister   . Stroke Sister   . Prostate cancer Brother   . Breast cancer Neg Hx     Social History:  reports that she has never smoked. She has never used smokeless tobacco. She reports that she does not drink alcohol and does not use drugs. She is widowed.  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  She lives in [place]. The patient is alone today.  Allergies:  Allergies  Allergen Reactions  . Codeine Nausea And Vomiting  . Flomax [Tamsulosin] Other (See Comments)    Pt states that this medication gave her a kidney infection.    Lebron Quam [Hydrocodone-Acetaminophen] Nausea And Vomiting  . Sulfa Antibiotics Nausea And Vomiting    Current Medications: Current Outpatient Medications  Medication Sig Dispense Refill  . acetaminophen (TYLENOL) 325 MG tablet Take 650 mg by mouth every 6 (six) hours as needed for mild pain, fever or headache.     . ALPRAZolam (XANAX) 0.25 MG tablet Take 1 tablet (0.25 mg total) by mouth at bedtime as needed for anxiety. 15 tablet 0  . aspirin 81 MG chewable tablet Chew 1 tablet (81 mg total) by mouth daily.    Marland Kitchen atorvastatin (LIPITOR) 20 MG tablet Take 1 tablet (20 mg total) by mouth daily. 30 tablet 5  . calcium-vitamin D 250-100 MG-UNIT tablet Take 1 tablet by mouth 2 (two) times daily.    . carvedilol (COREG) 6.25 MG tablet TAKE (1) TABLET BY MOUTH TWICE DAILY 295 tablet 3  . folic acid-pyridoxine-cyancobalamin (FOLBIC) 2.5-25-2 MG TABS tablet Take 1 tablet by mouth daily.     . hydroxypropyl methylcellulose /  hypromellose (ISOPTO TEARS / GONIOVISC) 2.5 % ophthalmic solution Place 1 drop into both eyes 4 (four) times daily as needed for dry eyes.    Marland Kitchen levothyroxine (SYNTHROID, LEVOTHROID) 25 MCG tablet Take 25 mcg by mouth daily before breakfast.     . lisinopril (PRINIVIL,ZESTRIL) 40 MG tablet Take 20 mg by mouth daily.     . Multiple Vitamins-Minerals (MULTIVITAMIN GUMMIES ADULT) CHEW Chew 2 each by mouth at bedtime.    . Multiple Vitamins-Minerals (PRESERVISION AREDS 2) CAPS Take 1 capsule by mouth daily.    Marland Kitchen omeprazole (PRILOSEC) 20 MG capsule Take 20 mg by mouth daily before breakfast.     . oxybutynin (DITROPAN) 5 MG tablet Take 1 tablet by mouth 2 (two) times daily.    . polyethylene glycol (MIRALAX / GLYCOLAX) packet Take 17 g by mouth daily as needed  for mild constipation. 14 each 0  . Simethicone 80 MG TABS Take 80 mg by mouth daily as needed (gas).      No current facility-administered medications for this visit.    Review of Systems  Constitutional: Positive for weight loss (4 lbs). Negative for chills, diaphoresis, fever and malaise/fatigue.       Feels "fine".  HENT: Negative.  Negative for congestion, ear discharge, ear pain, hearing loss, nosebleeds, sinus pain and sore throat.   Eyes: Negative.  Negative for blurred vision and double vision.  Respiratory: Negative.  Negative for cough, hemoptysis, sputum production and shortness of breath.   Cardiovascular: Negative.  Negative for chest pain, palpitations and leg swelling.  Gastrointestinal: Positive for diarrhea (occasional). Negative for abdominal pain, blood in stool, constipation, heartburn, melena, nausea and vomiting.  Genitourinary: Positive for dysuria (burning). Negative for frequency, hematuria and urgency.  Musculoskeletal: Negative.  Negative for back pain, joint pain, myalgias and neck pain.  Skin: Negative.  Negative for itching and rash.       Bug bite on hip  Neurological: Negative.  Negative for dizziness,  tingling, sensory change, speech change, focal weakness, weakness and headaches.  Endo/Heme/Allergies: Bruises/bleeds easily.  Psychiatric/Behavioral: Negative.  Negative for depression. The patient is not nervous/anxious and does not have insomnia.        "Going through natural grief process" since husband died last year  All other systems reviewed and are negative.  Performance status (ECOG): 1  Vitals Blood pressure (!) 149/87, pulse 79, temperature 97.9 F (36.6 C), temperature source Tympanic, resp. rate 18, height 5\' 2"  (1.575 m), weight 109 lb 5.6 oz (49.6 kg), SpO2 97 %.  Physical Exam Vitals and nursing note reviewed.  Constitutional:      General: She is not in acute distress.    Appearance: She is well-developed. She is not diaphoretic.  HENT:     Head: Normocephalic and atraumatic.     Comments: Short gray hair.    Mouth/Throat:     Mouth: Mucous membranes are moist.     Pharynx: Oropharynx is clear. No oropharyngeal exudate.  Eyes:     General: No scleral icterus.    Conjunctiva/sclera: Conjunctivae normal.     Pupils: Pupils are equal, round, and reactive to light.     Comments: Glasses.  Blue eyes.  Neck:     Vascular: No JVD.  Cardiovascular:     Rate and Rhythm: Normal rate.     Heart sounds: Normal heart sounds. No murmur heard.  No gallop.   Pulmonary:     Effort: Pulmonary effort is normal. No respiratory distress.     Breath sounds: Normal breath sounds. No wheezing or rales.  Abdominal:     General: Bowel sounds are normal. There is no distension.     Palpations: Abdomen is soft. There is no hepatomegaly, splenomegaly or mass.     Tenderness: There is no abdominal tenderness. There is no guarding or rebound.  Musculoskeletal:        General: Normal range of motion.     Cervical back: Normal range of motion and neck supple.  Lymphadenopathy:     Head:     Right side of head: No preauricular, posterior auricular or occipital adenopathy.     Left side  of head: No preauricular, posterior auricular or occipital adenopathy.     Cervical: No cervical adenopathy.     Upper Body:     Right upper body: No supraclavicular or axillary adenopathy.  Left upper body: No supraclavicular or axillary adenopathy.     Lower Body: No right inguinal adenopathy. No left inguinal adenopathy.  Skin:    General: Skin is warm and dry.     Coloration: Skin is not pale.     Findings: No erythema or rash.     Comments: Barely pink bug bite on right hip. No visible bite mark.  Neurological:     Mental Status: She is alert and oriented to person, place, and time.  Psychiatric:        Behavior: Behavior normal.        Thought Content: Thought content normal.        Judgment: Judgment normal.     12/06/2019: bug bite on right hip     Appointment on 12/06/2019  Component Date Value Ref Range Status  . LDH 12/06/2019 164  98 - 192 U/L Final   Performed at [facility], [address], [place], [place] [address]  . Sodium 12/06/2019 140  135 - 145 mmol/L Final  . Potassium 12/06/2019 3.9  3.5 - 5.1 mmol/L Final  . Chloride 12/06/2019 103  98 - 111 mmol/L Final  . CO2 12/06/2019 29  22 - 32 mmol/L Final  . Glucose, Bld 12/06/2019 105* 70 - 99 mg/dL Final   Glucose reference range applies only to samples taken after fasting for at least 8 hours.  . BUN 12/06/2019 13  8 - 23 mg/dL Final  . Creatinine, Ser 12/06/2019 0.84  0.44 - 1.00 mg/dL Final  . Calcium 12/06/2019 9.6  8.9 - 10.3 mg/dL Final  . Total Protein 12/06/2019 6.4* 6.5 - 8.1 g/dL Final  . Albumin 12/06/2019 3.9  3.5 - 5.0 g/dL Final  . AST 12/06/2019 23  15 - 41 U/L Final  . ALT 12/06/2019 13  0 - 44 U/L Final  . Alkaline Phosphatase 12/06/2019 56  38 - 126 U/L Final  . Total Bilirubin 12/06/2019 0.8  0.3 - 1.2 mg/dL Final  . GFR calc non Af Amer 12/06/2019 >60  >60 mL/min Final  . GFR calc Af Amer 12/06/2019 >60  >60 mL/min Final  . Anion gap 12/06/2019 8  5 - 15 Final    Performed at [facility], [address], [place], [place] [address]  . WBC 12/06/2019 4.6  4.0 - 10.5 K/uL Final  . RBC 12/06/2019 3.93  3.87 - 5.11 MIL/uL Final  . Hemoglobin 12/06/2019 12.5  12.0 - 15.0 g/dL Final  . HCT 12/06/2019 36.9  36 - 46 % Final  . MCV 12/06/2019 93.9  80.0 - 100.0 fL Final  . MCH 12/06/2019 31.8  26.0 - 34.0 pg Final  . MCHC 12/06/2019 33.9  30.0 - 36.0 g/dL Final  . RDW 12/06/2019 13.4  11.5 - 15.5 % Final  . Platelets 12/06/2019 91* 150 - 400 K/uL Final   Comment: Immature Platelet Fraction may be clinically indicated, consider ordering this additional test [id number]   . nRBC 12/06/2019 0.0  0.0 - 0.2 % Final  . Neutrophils Relative % 12/06/2019 69  % Final  . Neutro Abs 12/06/2019 3.1  1.7 - 7.7 K/uL Final  . Lymphocytes Relative 12/06/2019 21  % Final  . Lymphs Abs 12/06/2019 1.0  0.7 - 4.0 K/uL Final  . Monocytes Relative 12/06/2019 7  % Final  . Monocytes Absolute 12/06/2019 0.3  0 - 1 K/uL Final  . Eosinophils Relative 12/06/2019 3  % Final  . Eosinophils Absolute 12/06/2019 0.1  0 - 0 K/uL Final  . Basophils Relative 12/06/2019 0  % Final  . Basophils Absolute 12/06/2019 0.0  0 - 0 K/uL Final  . Immature Granulocytes 12/06/2019 0  % Final  . Abs Immature Granulocytes 12/06/2019 0.01  0.00 - 0.07 K/uL Final   Performed at [facility], [address] [place], [place] [address]    Assessment:  Teresa Franklin is a 86 y.o. female with chronic immune mediated thrombocytopenic purpura (ITP).  She has had intermittent thrombocytopenia since at least 2014.  Platelet count has typically ranged between 98,000 - 116,000.  Platelet count has dipped below 90,000 on 3 occasions (08/2015, 06/2016, and 11/2018).  Abdomen and pelvis CT on 09/05/2015 revealed no evidence of splenomegaly.   Work-up on 11/12/2015 revealed the following normal studies:  B12, hepatitis B core antibody total, hepatitis B surface antigen, hepatitis C  antibody, and HIV antibody.  Immunofixation revealed a IgG monoclonal protein with lambda light chain specificity.  Work-up on 12/02/2018 included hematocrit 41.3, hemoglobin 14.4, platelets 106,000, WBC 4,900 with ANC 3600.  Differential was unremarkable.  Platelet count in a citrate tube was 89,000.  Normal studies included:  CMP, ANA, free light chain ratio, folate, PT, PTT, and TSH.  Peripheral smear revealed persistent and stable thrombocytopenia for over a year. RBC, WBC, and platelet morphology was unremarkable.   Platelet count has been followed: 116,000 on 09/05/2015, 116,000 on 09/06/2015, 75,000 on 09/07/2015, 69,000 on 09/08/2015, 116,000 on 09/09/2015, 108,000 on 09/10/2015, 98,000 on 11/12/2015, 94,000 on 12/03/2015, 98,000 on 12/24/2015, 86,000 on 02/04/2016, 112,000 on 05/12/2016, 102,000 on 07/07/2016, 68,000 on 07/10/2016, 97,000 on 04/30/2017, 102,000 on 09/07/2017, 103,000 on 09/08/2017, 108,000 on 09/17/2017, 108,000 on 10/27/2018, 101,000 on 03/08/2018, 94,000 on 08/25/2018, 68,000 on 11/23/2018, 106,000 on 12/02/2018, 90,000 on 01/06/2019, 87,000 on 05/10/2019, 75,000 on 06/07/2019, and 86,000 on 10/05/2019.   She ha a monoclonal gammopathy of unknown significance (MGUS).  SPEP on 12/02/2018 revealed a 0.2 gm/dL IgG monoclonal protein with lambda light chain specificity.  SPEP has been followed (gm/dL): 0.2 on 12/02/2018, 0.3 on 06/07/2019, and 0.2 on 12/06/2019.  Symptomatically, she feels "fine".  Exam reveals no adenopathy or hepatosplenomegaly.  She has a tiny area of pinkness associated with a bug bite.  Plan: 1.   Labs today CBC with diff, CMP, LDH, SPEP. 2.   Chronic immune mediated thrombocytopenic purpura (ITP)             Platelet count is 91,000.  Baseline platelet count typically ranges between 98,000 - 116,000.  She denies any unusual bruising or bleeding.  Etiology may be idiopathic or possibly related to a low grade lymphoma given the monoclonal  gammopathy.  Treatment options include steroids, IVIG, Rituxan, N-plate, and Promacta.  Review plan for bone marrow aspirate and biopsy as well as CT scan if treatment required. 3.   Monoclonal gammopathy of unknown significance (MGUS)             Symptomatically, she denies any B symptoms.  Exam is unremarkable.  She has no CRAB criteria.  M spike is 0.2gm/dl (stable).  Continue to monitor every 6 months. 4.   Dysuria  UA and culture today. 5.   RTC in 6 months for MD assessment and labs (CBC with diff, CMP, LDH, SPEP).  Addendum:  Urine culture revealed rater than 100,000 colonies/mL Enterobacter aerogenes.  Bacteria is sensitive to ceftriaxone, ciprofloxacin, gentamicin, imipenem, piperacillin/tazobactam, sulfa and intermediately sensitive to nitrofurantoin and resistant to cefazolin.  Patient is  allergic to sulfa.  A prescription for ciprofloxacin x3 days was prescribed.  Dr. [name]' clinic was notified.  I discussed the assessment and treatment plan with the patient.  The patient was provided an opportunity to ask questions and all were answered.  The patient agreed with the plan and demonstrated an understanding of the instructions.  The patient was advised to call back if the symptoms worsen or if the condition fails to improve as anticipated.   Lequita Asal, MD, PhD    12/06/2019, 2:11 PM  I, Mirian Mo Tufford, am acting as a Education administrator for [employer]. Mike Gip, MD.   I, Zyanna Leisinger C. Mike Gip, MD, have reviewed the above documentation for accuracy and completeness, and I agree with the above.

## 2019-12-06 ENCOUNTER — Inpatient Hospital Stay (HOSPITAL_BASED_OUTPATIENT_CLINIC_OR_DEPARTMENT_OTHER): Payer: Medicare PPO | Admitting: Hematology and Oncology

## 2019-12-06 ENCOUNTER — Inpatient Hospital Stay: Payer: Medicare PPO | Attending: Hematology and Oncology

## 2019-12-06 ENCOUNTER — Ambulatory Visit: Payer: Medicare PPO | Admitting: Hematology and Oncology

## 2019-12-06 ENCOUNTER — Encounter: Payer: Self-pay | Admitting: Hematology and Oncology

## 2019-12-06 ENCOUNTER — Other Ambulatory Visit: Payer: Self-pay

## 2019-12-06 ENCOUNTER — Other Ambulatory Visit: Payer: Medicare PPO

## 2019-12-06 VITALS — BP 149/87 | HR 79 | Temp 97.9°F | Resp 18 | Ht 62.0 in | Wt 109.3 lb

## 2019-12-06 DIAGNOSIS — D472 Monoclonal gammopathy: Secondary | ICD-10-CM | POA: Diagnosis present

## 2019-12-06 DIAGNOSIS — R300 Dysuria: Secondary | ICD-10-CM | POA: Diagnosis not present

## 2019-12-06 DIAGNOSIS — D693 Immune thrombocytopenic purpura: Secondary | ICD-10-CM | POA: Diagnosis present

## 2019-12-06 LAB — CBC WITH DIFFERENTIAL/PLATELET
Abs Immature Granulocytes: 0.01 10*3/uL (ref 0.00–0.07)
Basophils Absolute: 0 10*3/uL (ref 0.0–0.1)
Basophils Relative: 0 %
Eosinophils Absolute: 0.1 10*3/uL (ref 0.0–0.5)
Eosinophils Relative: 3 %
HCT: 36.9 % (ref 36.0–46.0)
Hemoglobin: 12.5 g/dL (ref 12.0–15.0)
Immature Granulocytes: 0 %
Lymphocytes Relative: 21 %
Lymphs Abs: 1 10*3/uL (ref 0.7–4.0)
MCH: 31.8 pg (ref 26.0–34.0)
MCHC: 33.9 g/dL (ref 30.0–36.0)
MCV: 93.9 fL (ref 80.0–100.0)
Monocytes Absolute: 0.3 10*3/uL (ref 0.1–1.0)
Monocytes Relative: 7 %
Neutro Abs: 3.1 10*3/uL (ref 1.7–7.7)
Neutrophils Relative %: 69 %
Platelets: 91 10*3/uL — ABNORMAL LOW (ref 150–400)
RBC: 3.93 MIL/uL (ref 3.87–5.11)
RDW: 13.4 % (ref 11.5–15.5)
WBC: 4.6 10*3/uL (ref 4.0–10.5)
nRBC: 0 % (ref 0.0–0.2)

## 2019-12-06 LAB — URINALYSIS, COMPLETE (UACMP) WITH MICROSCOPIC
Bilirubin Urine: NEGATIVE
Glucose, UA: NEGATIVE mg/dL
Ketones, ur: NEGATIVE mg/dL
Nitrite: POSITIVE — AB
Protein, ur: NEGATIVE mg/dL
Specific Gravity, Urine: 1.01 (ref 1.005–1.030)
WBC, UA: >50 WBC/hpf (ref 0–5)
pH: 5.5 (ref 5.0–8.0)

## 2019-12-06 LAB — COMPREHENSIVE METABOLIC PANEL
ALT: 13 U/L (ref 0–44)
AST: 23 U/L (ref 15–41)
Albumin: 3.9 g/dL (ref 3.5–5.0)
Alkaline Phosphatase: 56 U/L (ref 38–126)
Anion gap: 8 (ref 5–15)
BUN: 13 mg/dL (ref 8–23)
CO2: 29 mmol/L (ref 22–32)
Calcium: 9.6 mg/dL (ref 8.9–10.3)
Chloride: 103 mmol/L (ref 98–111)
Creatinine, Ser: 0.84 mg/dL (ref 0.44–1.00)
GFR calc Af Amer: >60 mL/min (ref >60)
GFR calc non Af Amer: >60 mL/min (ref >60)
Glucose, Bld: 105 mg/dL — ABNORMAL HIGH (ref 70–99)
Potassium: 3.9 mmol/L (ref 3.5–5.1)
Sodium: 140 mmol/L (ref 135–145)
Total Bilirubin: 0.8 mg/dL (ref 0.3–1.2)
Total Protein: 6.4 g/dL — ABNORMAL LOW (ref 6.5–8.1)

## 2019-12-06 LAB — LACTATE DEHYDROGENASE: LDH: 164 U/L (ref 98–192)

## 2019-12-06 NOTE — Progress Notes (Signed)
The patient c/o burning with urination x 1 day and she has a new bite to her right hip.

## 2019-12-07 ENCOUNTER — Telehealth: Payer: Self-pay | Admitting: *Deleted

## 2019-12-07 ENCOUNTER — Telehealth: Payer: Self-pay

## 2019-12-07 LAB — PROTEIN ELECTROPHORESIS, SERUM
A/G Ratio: 1.4 (ref 0.7–1.7)
Albumin ELP: 3.4 g/dL (ref 2.9–4.4)
Alpha-1-Globulin: 0.2 g/dL (ref 0.0–0.4)
Alpha-2-Globulin: 0.6 g/dL (ref 0.4–1.0)
Beta Globulin: 0.8 g/dL (ref 0.7–1.3)
Gamma Globulin: 0.9 g/dL (ref 0.4–1.8)
Globulin, Total: 2.5 g/dL (ref 2.2–3.9)
M-Spike, %: 0.2 g/dL — ABNORMAL HIGH
Total Protein ELP: 5.9 g/dL — ABNORMAL LOW (ref 6.0–8.5)

## 2019-12-07 NOTE — Telephone Encounter (Signed)
Spoke with the patient to see if i could with her status, The patient was informed about her urine results. The patient wanted to know what will we treat her with. I informed her we are waiting on the culture to result then Dr Mike Gip will let her know what she need to be treated with. I also informed her that Dr Mike Gip will be able to see results and I will also be on the look out tomorrow for her results and we will get something ready before the weekend if results are in. I informed the patient I will give her a call back tomorrow.The patient was understanding and agreeable.

## 2019-12-07 NOTE — Telephone Encounter (Signed)
  Please call patient.  We are waiting for culture data.  M

## 2019-12-07 NOTE — Telephone Encounter (Signed)
Patient called asking about results of UA yesterday and if she needs to be on medicine for UTI as she is having burning. She requests a return call before the end of today  Urinalysis, Complete w Microscopic Order: [id number] Status:  Final result Visible to patient:  No (inaccessible in [place]) Next appt:  12/12/2019 at 11:40 AM in Radiology [facility] DEXA) Dx:  Dysuria  0 Result Notes  Ref Range & Units 1 d ago 2 mo ago  Color, Urine YELLOW YELLOW  YELLOW   APPearance CLEAR CLOUDYAbnormal  HAZYAbnormal   Specific Gravity, Urine 1.005 - 1.030 1.010  1.025   pH 5.0 - 8.0 5.5  5.0   Glucose, UA NEGATIVE mg/dL NEGATIVE  NEGATIVE   Hgb urine dipstick NEGATIVE MODERATEAbnormal  SMALLAbnormal   Bilirubin Urine NEGATIVE NEGATIVE  NEGATIVE   Ketones, ur NEGATIVE mg/dL NEGATIVE  TRACEAbnormal   Protein, ur NEGATIVE mg/dL NEGATIVE  30Abnormal   Nitrite NEGATIVE POSITIVEAbnormal  NEGATIVE   Leukocytes,Ua NEGATIVE MODERATEAbnormal  NEGATIVE   Squamous Epithelial / LPF 0 - 5 6-10  11-20   WBC, UA 0 - 5 WBC/hpf >50  6-10   RBC / HPF 0 - 5 RBC/hpf 6-10  0-5   Bacteria, UA NONE SEEN MANYAbnormal  FEWAbnormal   Comment: Performed at [facility], [address], [place], [place] [address]  Resulting Agency  [facility]      Specimen Collected: 12/06/19 14:31 Last Resulted: 12/06/19 14:47

## 2019-12-08 ENCOUNTER — Telehealth: Payer: Self-pay

## 2019-12-08 LAB — URINE CULTURE: Culture: >=100000 — AB

## 2019-12-08 MED ORDER — CIPROFLOXACIN HCL 250 MG PO TABS: 250.0000 mg | ORAL_TABLET | Freq: Two times a day (BID) | ORAL | 0 refills | Status: DC

## 2019-12-08 NOTE — Telephone Encounter (Signed)
The patient labs has been sent to the patient PCP office. I have informed the patient that Dr Mike Gip he called in a 3 days supply for Cipro. The patient was understanding and agreeable.

## 2019-12-12 ENCOUNTER — Inpatient Hospital Stay: Admission: RE | Admit: 2019-12-12 | Payer: Medicare PPO | Source: Ambulatory Visit

## 2019-12-19 ENCOUNTER — Ambulatory Visit
Admission: RE | Admit: 2019-12-19 | Discharge: 2019-12-19 | Disposition: A | Discharge status: Home / Self Care | Payer: Medicare PPO | Source: Ambulatory Visit | Attending: Family Medicine | Admitting: Family Medicine

## 2019-12-19 ENCOUNTER — Other Ambulatory Visit: Payer: Self-pay

## 2019-12-19 DIAGNOSIS — Z1231 Encounter for screening mammogram for malignant neoplasm of breast: Secondary | ICD-10-CM | POA: Diagnosis not present

## 2020-02-07 ENCOUNTER — Ambulatory Visit: Payer: Medicare PPO | Admitting: Family

## 2020-02-07 ENCOUNTER — Encounter: Payer: Self-pay | Admitting: Family

## 2020-02-07 ENCOUNTER — Other Ambulatory Visit: Payer: Self-pay

## 2020-02-07 VITALS — BP 134/88 | HR 75 | Ht 62.0 in | Wt 109.0 lb

## 2020-02-07 DIAGNOSIS — I10 Essential (primary) hypertension: Secondary | ICD-10-CM | POA: Diagnosis not present

## 2020-02-07 DIAGNOSIS — E782 Mixed hyperlipidemia: Secondary | ICD-10-CM

## 2020-02-07 DIAGNOSIS — I25118 Atherosclerotic heart disease of native coronary artery with other forms of angina pectoris: Secondary | ICD-10-CM | POA: Diagnosis not present

## 2020-02-07 DIAGNOSIS — I5022 Chronic systolic (congestive) heart failure: Secondary | ICD-10-CM | POA: Diagnosis not present

## 2020-02-07 DIAGNOSIS — Z8673 Personal history of transient ischemic attack (TIA), and cerebral infarction without residual deficits: Secondary | ICD-10-CM | POA: Diagnosis not present

## 2020-02-07 NOTE — Patient Instructions (Signed)
Medication Instructions:  - Your physician recommends that you continue on your current medications as directed. Please refer to the Current Medication list given to you today.  *If you need a refill on your cardiac medications before your next appointment, please call your pharmacy*   Lab Work: - none ordered  If you have labs (blood work) drawn today and your tests are completely normal, you will receive your results only by: . MyChart Message (if you have MyChart) OR . A paper copy in the mail If you have any lab test that is abnormal or we need to change your treatment, we will call you to review the results.   Testing/Procedures: - none ordered   Follow-Up: At [facility], you and your health needs are our priority.  As part of our continuing mission to provide you with exceptional heart care, we have created designated Provider Care Teams.  These Care Teams include your primary Cardiologist (physician) and Advanced Practice Providers (APPs -  Physician Assistants and Nurse Practitioners) who all work together to provide you with the care you need, when you need it.  We recommend signing up for the patient portal called "MyChart".  Sign up information is provided on this After Visit Summary.  MyChart is used to connect with patients for Virtual Visits (Telemedicine).  Patients are able to view lab/test results, encounter notes, upcoming appointments, etc.  Non-urgent messages can be sent to your provider as well.   To learn more about what you can do with MyChart, go to [email or web address].    Your next appointment:   6 month(s)  The format for your next appointment:   In Person  Provider:   You may see Muhammad Arida, MD or one of the following Advanced Practice Providers on your designated Care Team:    Christopher Berge, NP  Ryan Dunn, PA-C  Jacquelyn Visser, PA-C  Cadence Furth, PA-C  Caitlin Walker, NP    Other Instructions n/a  

## 2020-02-07 NOTE — Progress Notes (Signed)
Office Visit    Patient Name: Teresa Franklin Date of Encounter: 02/07/2020  Primary Care Provider:  [name], MD Primary Cardiologist:  [name], MD Electrophysiologist:  None   Chief Complaint    Teresa Franklin is a 86 y.o. female with a hx of CAD, CVA, HTN, HLD, HFrEF presents today for follow-up of CAD  Past Medical History    Past Medical History:  Diagnosis Date  . Anxiety   . AV block    a. 08/2015 in setting of NSTEMI and RPAV intervention-->required temp wire but not PPM.  . Back pain   . Chronic combined systolic (congestive) and diastolic (congestive) heart failure ([place])    a. 08/2015  Echo: EF 55-60%, no rwma, mod MR, mildly dil LA/RA, mod TR, PASP 4mmHg; b. 05/2016 Echo: EF 30-35%, inflat/inf sev HK, Gr1 DD; c. 08/2017 Echo: EF 30-35%, mild LVH. Sev inf/infseptal HK. Gr1 DD. Mod MR. mildly dil LA. Mildly reduced RV fxn.  . Coronary artery disease    a. 08/2015 NSTEMI/PCI: LM nl, LAD 75p (med rx), 20 diffuse, 50d, LCX nl, OM1/2 nl, RCA 72m, RPAV 100 (2.25x12 Promus Premier DES) - complicated by 2:1 HB req temp wire post-PCI; b. 04/2016 MV: EF 34%, lateral HK/infarct. No ischemia.  . Diverticulitis   . Generalized anxiety disorder   . Hepatic steatosis   . HLD (hyperlipidemia)   . Hypertension   . Hypokalemia   . Hypomagnesemia   . Hypothyroidism   . Ischemic cardiomyopathy    a. 08/2015  Echo: EF 55-60%; b. 05/2016 Echo: EF 30-35%; c. 08/2017 Echo: EF 30-35%.  . OA (osteoarthritis)   . Osteopenia   . Sleep apnea   . Stroke [facility])    a. 08/2015 Right PCA territory infarct/temoral lobe cerebral infarction-->MRA showed severe flow liminting stenosis of R P2 cerebral artery;  b. 08/2015 Carotid U/S: 1-39% bilat ICA stenosis.  . Subdural hemorrhage ([place])    a. 05/2013 in setting of fall.  . Thrombocytopenia ([place])   . Weakness    Past Surgical History:  Procedure Laterality Date  . CARDIAC CATHETERIZATION N/A 09/08/2015   Procedure: Temporary Pacemaker;  Surgeon:  Leonie Man, MD;  Location: [facility];  Service: Cardiovascular;  Laterality: N/A;  . CARDIAC CATHETERIZATION N/A 09/08/2015   Procedure: Left Heart Cath and Coronary Angiography;  Surgeon: Leonie Man, MD;  Location: [place] [facility];  Service: Cardiovascular;  Laterality: N/A;  . CARDIAC CATHETERIZATION N/A 09/08/2015   Procedure: Coronary Stent Intervention;  Surgeon: Leonie Man, MD;  Location: [place] [facility];  Service: Cardiovascular;  Laterality: N/A;  . CARDIAC CATHETERIZATION  09/08/2015   Procedure: Central Line Insertion;  Surgeon: Leonie Man, MD;  Location: [place] [facility];  Service: Cardiovascular;;  . CARDIAC CATHETERIZATION  09/08/2015   Procedure: Arterial Line Insertion;  Surgeon: Leonie Man, MD;  Location: [place];  Service: Cardiovascular;;  . CARDIAC CATHETERIZATION N/A 09/09/2015   Procedure: Temporary Wire;  Surgeon: Lorretta Harp, MD;  Location: [place];  Service: Cardiovascular;  Laterality: N/A;  . ELBOW SURGERY    . LEFT HEART CATH AND CORONARY ANGIOGRAPHY N/A 09/22/2017   Procedure: LEFT HEART CATH AND CORONARY ANGIOGRAPHY;  Surgeon: [name], MD;  Location: [facility];  Service: Cardiovascular;  Laterality: N/A;    Allergies  Allergies  Allergen Reactions  . Codeine Nausea And Vomiting  . Flomax [Tamsulosin] Other (See Comments)  Pt states that this medication gave her a kidney infection.    Lebron Quam [Hydrocodone-Acetaminophen] Nausea And Vomiting  . Sulfa Antibiotics Nausea And Vomiting    History of Present Illness    Teresa Franklin is a 86 y.o. female with a hx of CAD, CVA, HTN, HLD, HFrEF, hypothyroidism, GERD chronic ITP last seen 11/17/2018 by Dr. Fletcher Anon.  She presented in June 2017 with NSTEMI complicated by hypotension and high-grade AV block.  Cardiac catheterization showed occluded right posterior AV groove artery treated with PCI and DES.  She did not require PPM.  EF was  normal at that time.  Also noted significant disease affecting proximal and mid LAD.  Recommended for medical therapy considering fetal status and involvement of the large diagonal branch at site of stenosis.  She also had CVA during the same hospitalization though no residual neurological deficits.  Due to worsening exertional dyspnea 2018 underwent nuclear stress test with no evidence of ischemia.  Evidence of prior infarct in lateral wall with EF 34%.  Followed by echo with LVEF 30-35% with severe hypokinesis of inferolateral and inferior wall with moderate mitral regurgitation no evidence of pulmonary hypertension.  LHC 09/2017 with widely patent RCA stent and no significant restenosis.  Moderate proximal to mid LAD disease estimated to be 60% which was actually improved compared to prior.  LVEDP normal.  She was seen last 11/17/2018 by Dr. Fletcher Anon.  It was noted that her Coreg dose has been reduced due to hypotension.  She had also lost her husband since last seen and was continuing to grieve.  She presents today for follow-up.  Tells me she understandably continues to grieve the loss of her husband.  She does have a strong community that has supported her.  She reports no chest pain, pressure, tightness.  Dyspnea on exertion is stable at her baseline.  She enjoys walking for exercise and notices remarkable difference when she walks regularly.  She endorses eating a low-sodium, heart healthy diet.  Denies orthopnea, PND, edema.  EKGs/Labs/Other Studies Reviewed:   The following studies were reviewed today:  EKG:  EKG is ordered today.  The ekg ordered today demonstrates NSR 75 bpm with no acute St/T wave changes.   Recent Labs: 12/06/2019: ALT 13; BUN 13; Creatinine, Ser 0.84; Hemoglobin 12.5; Platelets 91; Potassium 3.9; Sodium 140  Recent Lipid Panel    Component Value Date/Time   CHOL 114 09/09/2015 0350   TRIG 111 09/09/2015 0350   HDL 38 (L) 09/09/2015 0350   CHOLHDL 3.0 09/09/2015 0350    VLDL 22 09/09/2015 0350   LDLCALC 54 09/09/2015 0350    Home Medications   Current Meds  Medication Sig  . acetaminophen (TYLENOL) 325 MG tablet Take 650 mg by mouth every 6 (six) hours as needed for mild pain, fever or headache.   . ALPRAZolam (XANAX) 0.25 MG tablet Take 1 tablet (0.25 mg total) by mouth at bedtime as needed for anxiety.  Marland Kitchen aspirin 81 MG chewable tablet Chew 1 tablet (81 mg total) by mouth daily.  Marland Kitchen atorvastatin (LIPITOR) 20 MG tablet Take 1 tablet (20 mg total) by mouth daily.  . calcium-vitamin D 250-100 MG-UNIT tablet Take 1 tablet by mouth 2 (two) times daily.  . carvedilol (COREG) 6.25 MG tablet TAKE (1) TABLET BY MOUTH TWICE DAILY  . Coenzyme Q10 (CO Q 10 PO) Take by mouth daily.  . folic acid-pyridoxine-cyancobalamin (FOLBIC) 2.5-25-2 MG TABS tablet Take 1 tablet by mouth daily.   . hydroxypropyl  methylcellulose / hypromellose (ISOPTO TEARS / GONIOVISC) 2.5 % ophthalmic solution Place 1 drop into both eyes 4 (four) times daily as needed for dry eyes.  Marland Kitchen levothyroxine (SYNTHROID, LEVOTHROID) 25 MCG tablet Take 25 mcg by mouth daily before breakfast.   . lisinopril (PRINIVIL,ZESTRIL) 40 MG tablet Take 20 mg by mouth daily.   . Multiple Vitamins-Minerals (MULTIVITAMIN GUMMIES ADULT) CHEW Chew 2 each by mouth at bedtime.  . Multiple Vitamins-Minerals (PRESERVISION AREDS 2) CAPS Take 1 capsule by mouth daily.  Marland Kitchen omeprazole (PRILOSEC) 20 MG capsule Take 20 mg by mouth daily before breakfast.   . oxybutynin (DITROPAN) 5 MG tablet Take 1 tablet by mouth 2 (two) times daily.  . polyethylene glycol (MIRALAX / GLYCOLAX) packet Take 17 g by mouth daily as needed for mild constipation.  . Simethicone 80 MG TABS Take 80 mg by mouth daily as needed (gas).      Review of Systems  All other systems reviewed and are otherwise negative except as noted above.  Physical Exam    VS:  BP 134/88   Pulse 75   Ht 5\' 2"  (1.575 m)   Wt 109 lb (49.4 kg)   BMI 19.94 kg/m  , BMI Body  mass index is 19.94 kg/m.  Wt Readings from Last 3 Encounters:  02/07/20 109 lb (49.4 kg)  12/06/19 109 lb 5.6 oz (49.6 kg)  10/05/19 110 lb 0.2 oz (49.9 kg)    GEN: Well nourished, well developed, in no acute distress. HEENT: normal. Neck: Supple, no JVD, carotid bruits, or masses. Cardiac: RRR, no murmurs, rubs, or gallops. No clubbing, cyanosis, edema.  Radials/DP/PT 2+ and equal bilaterally.  Respiratory:  Respirations regular and unlabored, clear to auscultation bilaterally. GI: Soft, nontender, nondistended. MS: No deformity or atrophy. Skin: Warm and dry, no rash. Neuro:  Strength and sensation are intact. Psych: Normal affect.  Assessment & Plan    1. CAD-stable with no anginal symptoms.  Her angina equivalent is nausea.  GDMT includes aspirin, beta-blocker, statin.  Continue regular cardiovascular exercise.  2. Chronic systolic heart failure- Euvolemic and well compensated.  NYHA I-II. we discussed updating echocardiogram as her last EF was 30 to 35% by echo 08/2017.  She prefers to defer until her next follow-up.  She has no new or worsening shortness of breath nor orthopnea nor edema.  GDMT includes Coreg, lisinopril.  No indication for loop diuretic at this time.  Future considerations include addition of low-dose spironolactone.  3. HLD- Continue to follow with primary care provider. Continue Atorvastatin 20mg  daily.   4. HTN- BP well controlled. Continue current antihypertensive regimen.   5. History of CVA - No residual defecits. Continue aspirin and statin.   Disposition: Follow up in 6 month(s) with Dr. Fletcher Anon or APP  Signed, Loel Dubonnet, NP 02/07/2020, 1:48 PM [place]

## 2020-06-03 NOTE — Progress Notes (Signed)
[facility]  [address] [place], [place] [address] Phone: [phone]  Fax: [phone]   Clinic Day:  06/04/2020  Referring physician: [name], MD  Chief Complaint: Teresa Franklin is a 87 y.o. female with chronic immune mediated thrombocytopenic purpura (ITP) and an IgG monoclonal gammopathy who is seen for 6 month assessment.  HPI: The patient was last seen in the hematology clinic on 12/06/2019. At that time, she felt "fine".  Exam revealed no adenopathy or hepatosplenomegaly.  Hematocrit was 36.9, hemoglobin 12.5, platelets 91,000, WBC 4,600. Total protein was 6.4. LDH was 164. M spike was 0.2 gm/dL. Urine culture revealed > 100,000 colonies/mL Enterobacter aerogenes; she was prescribed ciprofloxacin x 3 days.  Bilateral screening mammogram on 12/19/2019 revealed no evidence of malignancy.  During the interim, she has been "alright." Her UTI cleared up. She is not having recurrent infections. She takes medication for diarrhea and has not had any in a while. She denies lumps and bumps. She has lost 4 lbs. She does not get very hungry because she does not do a lot of physical activity. She walks everyday.   Her mood is not as good as it has been in the past due to COVID-19. Her husband died a little over a year ago and she is "going through the grieving process."   Past Medical History:  Diagnosis Date  . Anxiety   . AV block    a. 08/2015 in setting of NSTEMI and RPAV intervention-->required temp wire but not PPM.  . Back pain   . Chronic combined systolic (congestive) and diastolic (congestive) heart failure ([place])    a. 08/2015  Echo: EF 55-60%, no rwma, mod MR, mildly dil LA/RA, mod TR, PASP 25mmHg; b. 05/2016 Echo: EF 30-35%, inflat/inf sev HK, Gr1 DD; c. 08/2017 Echo: EF 30-35%, mild LVH. Sev inf/infseptal HK. Gr1 DD. Mod MR. mildly dil LA. Mildly reduced RV fxn.  . Coronary artery disease    a. 08/2015 NSTEMI/PCI: LM nl, LAD 75p (med rx),  20 diffuse, 50d, LCX nl, OM1/2 nl, RCA 34m, RPAV 100 (2.25x12 Promus Premier DES) - complicated by 2:1 HB req temp wire post-PCI; b. 04/2016 MV: EF 34%, lateral HK/infarct. No ischemia.  . Diverticulitis   . Generalized anxiety disorder   . Hepatic steatosis   . HLD (hyperlipidemia)   . Hypertension   . Hypokalemia   . Hypomagnesemia   . Hypothyroidism   . Ischemic cardiomyopathy    a. 08/2015  Echo: EF 55-60%; b. 05/2016 Echo: EF 30-35%; c. 08/2017 Echo: EF 30-35%.  . OA (osteoarthritis)   . Osteopenia   . Sleep apnea   . Stroke [facility])    a. 08/2015 Right PCA territory infarct/temoral lobe cerebral infarction-->MRA showed severe flow liminting stenosis of R P2 cerebral artery;  b. 08/2015 Carotid U/S: 1-39% bilat ICA stenosis.  . Subdural hemorrhage ([place])    a. 05/2013 in setting of fall.  . Thrombocytopenia ([place])   . Weakness     Past Surgical History:  Procedure Laterality Date  . CARDIAC CATHETERIZATION N/A 09/08/2015   Procedure: Temporary Pacemaker;  Surgeon: Leonie Man, MD;  Location: [place] [facility];  Service: Cardiovascular;  Laterality: N/A;  . CARDIAC CATHETERIZATION N/A 09/08/2015   Procedure: Left Heart Cath and Coronary Angiography;  Surgeon: Leonie Man, MD;  Location: [place] [facility];  Service: Cardiovascular;  Laterality: N/A;  . CARDIAC CATHETERIZATION N/A 09/08/2015   Procedure: Coronary Stent Intervention;  Surgeon: Shanon Brow  Loren Racer, MD;  Location: [place] [facility];  Service: Cardiovascular;  Laterality: N/A;  . CARDIAC CATHETERIZATION  09/08/2015   Procedure: Central Line Insertion;  Surgeon: Leonie Man, MD;  Location: [place] [facility];  Service: Cardiovascular;;  . CARDIAC CATHETERIZATION  09/08/2015   Procedure: Arterial Line Insertion;  Surgeon: Leonie Man, MD;  Location: [place] [facility];  Service: Cardiovascular;;  . CARDIAC CATHETERIZATION N/A 09/09/2015   Procedure: Temporary Wire;  Surgeon: Lorretta Harp, MD;  Location: [place] [facility]  [facility];  Service: Cardiovascular;  Laterality: N/A;  . ELBOW SURGERY    . LEFT HEART CATH AND CORONARY ANGIOGRAPHY N/A 09/22/2017   Procedure: LEFT HEART CATH AND CORONARY ANGIOGRAPHY;  Surgeon: [name], MD;  Location: [place] [facility];  Service: Cardiovascular;  Laterality: N/A;    Family History  Problem Relation Age of Onset  . Hypertension Mother   . Hyperlipidemia Mother   . Stroke Mother   . Diabetes Sister   . Hypertension Sister   . Hyperlipidemia Sister   . Bipolar disorder Sister   . Hyperlipidemia Sister   . Stroke Sister   . Prostate cancer Brother   . Breast cancer Neg Hx     Social History:  reports that she has never smoked. She has never used smokeless tobacco. She reports that she does not drink alcohol and does not use drugs. She is widowed.  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  She lives in [place]. The patient is alone today.  Allergies:  Allergies  Allergen Reactions  . Codeine Nausea And Vomiting  . Flomax [Tamsulosin] Other (See Comments)    Pt states that this medication gave her a kidney infection.    Lebron Quam [Hydrocodone-Acetaminophen] Nausea And Vomiting  . Sulfa Antibiotics Nausea And Vomiting    Current Medications: Current Outpatient Medications  Medication Sig Dispense Refill  . acetaminophen (TYLENOL) 325 MG tablet Take 650 mg by mouth every 6 (six) hours as needed for mild pain, fever or headache.     . ALPRAZolam (XANAX) 0.25 MG tablet Take 1 tablet (0.25 mg total) by mouth at bedtime as needed for anxiety. 15 tablet 0  . aspirin 81 MG chewable tablet Chew 1 tablet (81 mg total) by mouth daily.    Marland Kitchen atorvastatin (LIPITOR) 20 MG tablet Take 1 tablet (20 mg total) by mouth daily. 30 tablet 5  . calcium-vitamin D 250-100 MG-UNIT tablet Take 1 tablet by mouth 2 (two) times daily.    . carvedilol (COREG) 6.25 MG tablet TAKE (1) TABLET BY MOUTH TWICE DAILY 180 tablet 3  . Coenzyme Q10 (CO Q 10 PO) Take by mouth daily.    .  hydroxypropyl methylcellulose / hypromellose (ISOPTO TEARS / GONIOVISC) 2.5 % ophthalmic solution Place 1 drop into both eyes 4 (four) times daily as needed for dry eyes.    Marland Kitchen levothyroxine (SYNTHROID, LEVOTHROID) 25 MCG tablet Take 25 mcg by mouth daily before breakfast.     . lisinopril (PRINIVIL,ZESTRIL) 40 MG tablet Take 20 mg by mouth daily.     . Multiple Vitamins-Minerals (MULTIVITAMIN GUMMIES ADULT) CHEW Chew 2 each by mouth at bedtime.    . Multiple Vitamins-Minerals (PRESERVISION AREDS 2) CAPS Take 1 capsule by mouth daily.    Marland Kitchen omeprazole (PRILOSEC) 20 MG capsule Take 20 mg by mouth daily before breakfast.     . oxybutynin (DITROPAN) 5 MG tablet Take 1 tablet by mouth 2 (two) times daily.    Marland Kitchen  polyethylene glycol (MIRALAX / GLYCOLAX) packet Take 17 g by mouth daily as needed for mild constipation. 14 each 0  . Simethicone 80 MG TABS Take 80 mg by mouth daily as needed (gas).     . folic acid-pyridoxine-cyancobalamin (FOLBIC) 2.5-25-2 MG TABS tablet Take 1 tablet by mouth daily.  (Patient not taking: Reported on 06/04/2020)     No current facility-administered medications for this visit.    Review of Systems  Constitutional: Positive for weight loss (4 lbs). Negative for chills, diaphoresis, fever and malaise/fatigue.       Feels "alright."  HENT: Negative.  Negative for congestion, ear discharge, ear pain, hearing loss, nosebleeds, sinus pain and sore throat.   Eyes: Negative.  Negative for blurred vision and double vision.  Respiratory: Negative.  Negative for cough, hemoptysis, sputum production and shortness of breath.   Cardiovascular: Negative.  Negative for chest pain, palpitations and leg swelling.  Gastrointestinal: Negative.  Negative for abdominal pain, blood in stool, constipation, diarrhea, heartburn, melena, nausea and vomiting.  Genitourinary: Negative.  Negative for dysuria, frequency, hematuria and urgency.  Musculoskeletal: Negative.  Negative for back pain, joint  pain, myalgias and neck pain.  Skin: Negative.  Negative for itching and rash.  Neurological: Negative.  Negative for dizziness, tingling, sensory change, speech change, focal weakness, weakness and headaches.  Endo/Heme/Allergies: Negative.  Does not bruise/bleed easily.  Psychiatric/Behavioral: Negative for depression. The patient is not nervous/anxious and does not have insomnia.        Mood is not as good as usual due to COVID-19. Husband died a little over a year ago.  All other systems reviewed and are negative.  Performance status (ECOG): 1  Vitals Blood pressure (!) 145/72, pulse 71, temperature (!) 96.2 F (35.7 C), temperature source Tympanic, weight 105 lb 13.1 oz (48 kg), SpO2 100 %.  Physical Exam Vitals and nursing note reviewed.  Constitutional:      Appearance: She is not diaphoretic.     Comments: Thin elderly woman sitting comfortably in the exam room in no acute distress.  HENT:     Head: Normocephalic and atraumatic.     Mouth/Throat:     Pharynx: No oropharyngeal exudate.  Eyes:     Conjunctiva/sclera: Conjunctivae normal.     Pupils: Pupils are equal, round, and reactive to light.     Comments: Glasses.  Blue eyes.  Neck:     Vascular: No JVD.  Cardiovascular:     Rate and Rhythm: Normal rate and regular rhythm.     Heart sounds: Normal heart sounds. No murmur heard.   Pulmonary:     Effort: Pulmonary effort is normal. No respiratory distress.     Breath sounds: Normal breath sounds. No wheezing or rales.  Chest:  Breasts:     Right: No supraclavicular adenopathy.     Left: No supraclavicular adenopathy.    Abdominal:     General: Bowel sounds are normal. There is no distension.     Palpations: Abdomen is soft. There is no mass.     Tenderness: There is no abdominal tenderness. There is no guarding or rebound.  Musculoskeletal:        General: No tenderness. Normal range of motion.     Cervical back: Normal range of motion and neck supple.   Lymphadenopathy:     Cervical: No cervical adenopathy.     Upper Body:     Right upper body: No supraclavicular adenopathy.     Left upper body: No  supraclavicular adenopathy.  Skin:    General: Skin is warm and dry.     Coloration: Skin is not pale.     Findings: No erythema or rash.     Comments: No petechiae.  Neurological:     Mental Status: She is alert and oriented to person, place, and time.  Psychiatric:        Behavior: Behavior normal.        Thought Content: Thought content normal.        Judgment: Judgment normal.    Appointment on 06/04/2020  Component Date Value Ref Range Status  . LDH 06/04/2020 162  98 - 192 U/L Final   Performed at [facility], [address], [place], [place] [address]  . Sodium 06/04/2020 143  135 - 145 mmol/L Final  . Potassium 06/04/2020 4.3  3.5 - 5.1 mmol/L Final  . Chloride 06/04/2020 102  98 - 111 mmol/L Final  . CO2 06/04/2020 29  22 - 32 mmol/L Final  . Glucose, Bld 06/04/2020 101* 70 - 99 mg/dL Final   Glucose reference range applies only to samples taken after fasting for at least 8 hours.  . BUN 06/04/2020 14  8 - 23 mg/dL Final  . Creatinine, Ser 06/04/2020 0.81  0.44 - 1.00 mg/dL Final  . Calcium 06/04/2020 10.2  8.9 - 10.3 mg/dL Final  . Total Protein 06/04/2020 7.2  6.5 - 8.1 g/dL Final  . Albumin 06/04/2020 4.2  3.5 - 5.0 g/dL Final  . AST 06/04/2020 22  15 - 41 U/L Final  . ALT 06/04/2020 13  0 - 44 U/L Final  . Alkaline Phosphatase 06/04/2020 58  38 - 126 U/L Final  . Total Bilirubin 06/04/2020 1.0  0.3 - 1.2 mg/dL Final  . GFR, Estimated 06/04/2020 >60  >60 mL/min Final   Comment: (NOTE) Calculated using the CKD-EPI Creatinine Equation (2021)   . Anion gap 06/04/2020 12  5 - 15 Final   Performed at [facility], [address], [place], [place] [address]  . WBC 06/04/2020 4.3  4.0 - 10.5 K/uL Final  . RBC 06/04/2020 4.37  3.87 - 5.11 MIL/uL Final  . Hemoglobin 06/04/2020 13.9  12.0 - 15.0  g/dL Final  . HCT 06/04/2020 41.0  36.0 - 46.0 % Final  . MCV 06/04/2020 93.8  80.0 - 100.0 fL Final  . MCH 06/04/2020 31.8  26.0 - 34.0 pg Final  . MCHC 06/04/2020 33.9  30.0 - 36.0 g/dL Final  . RDW 06/04/2020 13.4  11.5 - 15.5 % Final  . Platelets 06/04/2020 90* 150 - 400 K/uL Final   Comment: Immature Platelet Fraction may be clinically indicated, consider ordering this additional test [id number] CONSISTENT WITH PREVIOUS RESULT   . nRBC 06/04/2020 0.0  0.0 - 0.2 % Final  . Neutrophils Relative % 06/04/2020 63  % Final  . Neutro Abs 06/04/2020 2.7  1.7 - 7.7 K/uL Final  . Lymphocytes Relative 06/04/2020 26  % Final  . Lymphs Abs 06/04/2020 1.1  0.7 - 4.0 K/uL Final  . Monocytes Relative 06/04/2020 8  % Final  . Monocytes Absolute 06/04/2020 0.3  0.1 - 1.0 K/uL Final  . Eosinophils Relative 06/04/2020 2  % Final  . Eosinophils Absolute 06/04/2020 0.1  0.0 - 0.5 K/uL Final  . Basophils Relative 06/04/2020 1  % Final  . Basophils Absolute 06/04/2020 0.0  0.0 - 0.1 K/uL Final  . Immature Granulocytes 06/04/2020 0  % Final  .  Abs Immature Granulocytes 06/04/2020 0.01  0.00 - 0.07 K/uL Final   Performed at [facility], [address] [place], [place] [address]    Assessment:  Teresa Franklin is a 87 y.o. female with chronic immune mediated thrombocytopenic purpura (ITP).  She has had intermittent thrombocytopenia since at least 2014.  Platelet count has typically ranged between 98,000 - 116,000.  Platelet count has dipped below 90,000 on 3 occasions (08/2015, 06/2016, and 11/2018).  Abdomen and pelvis CT on 09/05/2015 revealed no evidence of splenomegaly.   Work-up on 11/12/2015 revealed the following normal studies:  B12, hepatitis B core antibody total, hepatitis B surface antigen, hepatitis C antibody, and HIV antibody.  Immunofixation revealed a IgG monoclonal protein with lambda light chain specificity.  Work-up on 12/02/2018 included hematocrit 41.3, hemoglobin  14.4, platelets 106,000, WBC 4,900 with ANC 3600.  Differential was unremarkable.  Platelet count in a citrate tube was 89,000.  Normal studies included:  CMP, ANA, free light chain ratio, folate, PT, PTT, and TSH.  Peripheral smear revealed persistent and stable thrombocytopenia for over a year. RBC, WBC, and platelet morphology was unremarkable.   Platelet count has been followed: 116,000 on 09/05/2015, 116,000 on 09/06/2015, 75,000 on 09/07/2015, 69,000 on 09/08/2015, 116,000 on 09/09/2015, 108,000 on 09/10/2015, 98,000 on 11/12/2015, 94,000 on 12/03/2015, 98,000 on 12/24/2015, 86,000 on 02/04/2016, 112,000 on 05/12/2016, 102,000 on 07/07/2016, 68,000 on 07/10/2016, 97,000 on 04/30/2017, 102,000 on 09/07/2017, 103,000 on 09/08/2017, 108,000 on 09/17/2017, 108,000 on 10/27/2018, 101,000 on 03/08/2018, 94,000 on 08/25/2018, 68,000 on 11/23/2018, 106,000 on 12/02/2018, 90,000 on 01/06/2019, 87,000 on 05/10/2019, 75,000 on 06/07/2019, and 86,000 on 10/05/2019.   She ha a monoclonal gammopathy of unknown significance (MGUS).  SPEP on 12/02/2018 revealed a 0.2 gm/dL IgG monoclonal protein with lambda light chain specificity.  SPEP has been followed (gm/dL): 0.2 on 12/02/2018, 0.3 on 06/07/2019, 0.2 on 12/06/2019, and 0.3 on 06/04/2020.  Symptomatically, she feels "alright."  She has not had any recurrent infections. She has lost 4 pounds.  Exam reveals no adenopathy or hepatosplenomegaly.  Plan: 1.   Labs today: CBC with diff, CMP, LDH, SPEP 2.   Chronic immune mediated thrombocytopenic purpura (ITP)             Platelet count is 90,000.  Baseline platelet count typically ranges between 98,000 - 116,000.  She denies any excess bruising or bleeding.  Etiology may be idiopathic or possibly related to a low grade lymphoma given the monoclonal gammopathy.  Treatment options include steroids, IVIG, Rituxan, N-plate, and Promacta.  Discuss ongoing surveillance with plan for bone marrow aspirate and biopsy  as well as CT scans if any concerning symptoms or exam. 3.   Monoclonal gammopathy of unknown significance (MGUS)             Symptomatically, she denies any fevers or sweats.  She has had no recurrent infections.  Exam reveals no adenopathy or hepatosplenomegaly  She has no CRAB criteria.  M spike is 0.3 gm/dl (stable).  Continue annual surveillance every 6 months. 4.   RTC in 6 months for labs (CBC with diff, CMP, LDH, SPEP). 5.   RTC in 12 months for MD assess and labs (CBC with diff, CMP, LDH, SPEP).  I discussed the assessment and treatment plan with the patient.  The patient was provided an opportunity to ask questions and all were answered.  The patient agreed with the plan and demonstrated an understanding of the instructions.  The patient was advised to  call back if the symptoms worsen or if the condition fails to improve as anticipated.  I provided 15 minutes of face-to-face time during this this encounter and > 50% was spent counseling as documented under my assessment and plan.  An additional 5 minutes were spent reviewing her chart (Epic and Care Everywhere) including notes, labs, and imaging studies.    Lequita Asal, MD, PhD    06/04/2020, 1:52 PM  I, Mirian Mo Tufford, am acting as a Education administrator for [employer]. Mike Gip, MD.   I, Calandria Mullings C. Mike Gip, MD, have reviewed the above documentation for accuracy and completeness, and I agree with the above.

## 2020-06-04 ENCOUNTER — Inpatient Hospital Stay: Payer: Medicare PPO | Attending: Hematology and Oncology | Admitting: Hematology and Oncology

## 2020-06-04 ENCOUNTER — Encounter: Payer: Self-pay | Admitting: Hematology and Oncology

## 2020-06-04 ENCOUNTER — Other Ambulatory Visit: Payer: Self-pay

## 2020-06-04 ENCOUNTER — Inpatient Hospital Stay: Payer: Medicare PPO

## 2020-06-04 VITALS — BP 145/72 | HR 71 | Temp 96.2°F | Wt 105.8 lb

## 2020-06-04 DIAGNOSIS — R300 Dysuria: Secondary | ICD-10-CM

## 2020-06-04 DIAGNOSIS — M8580 Other specified disorders of bone density and structure, unspecified site: Secondary | ICD-10-CM | POA: Diagnosis not present

## 2020-06-04 DIAGNOSIS — D472 Monoclonal gammopathy: Secondary | ICD-10-CM

## 2020-06-04 DIAGNOSIS — I5042 Chronic combined systolic (congestive) and diastolic (congestive) heart failure: Secondary | ICD-10-CM | POA: Diagnosis not present

## 2020-06-04 DIAGNOSIS — D693 Immune thrombocytopenic purpura: Secondary | ICD-10-CM

## 2020-06-04 DIAGNOSIS — Z7982 Long term (current) use of aspirin: Secondary | ICD-10-CM | POA: Diagnosis not present

## 2020-06-04 DIAGNOSIS — Z8349 Family history of other endocrine, nutritional and metabolic diseases: Secondary | ICD-10-CM | POA: Diagnosis not present

## 2020-06-04 DIAGNOSIS — Z833 Family history of diabetes mellitus: Secondary | ICD-10-CM | POA: Diagnosis not present

## 2020-06-04 DIAGNOSIS — Z8042 Family history of malignant neoplasm of prostate: Secondary | ICD-10-CM | POA: Insufficient documentation

## 2020-06-04 DIAGNOSIS — I252 Old myocardial infarction: Secondary | ICD-10-CM | POA: Diagnosis not present

## 2020-06-04 DIAGNOSIS — Z8249 Family history of ischemic heart disease and other diseases of the circulatory system: Secondary | ICD-10-CM | POA: Insufficient documentation

## 2020-06-04 DIAGNOSIS — Z8673 Personal history of transient ischemic attack (TIA), and cerebral infarction without residual deficits: Secondary | ICD-10-CM | POA: Insufficient documentation

## 2020-06-04 DIAGNOSIS — I110 Hypertensive heart disease with heart failure: Secondary | ICD-10-CM | POA: Insufficient documentation

## 2020-06-04 DIAGNOSIS — E039 Hypothyroidism, unspecified: Secondary | ICD-10-CM | POA: Diagnosis not present

## 2020-06-04 DIAGNOSIS — Z79899 Other long term (current) drug therapy: Secondary | ICD-10-CM | POA: Diagnosis not present

## 2020-06-04 LAB — CBC WITH DIFFERENTIAL/PLATELET
Abs Immature Granulocytes: 0.01 10*3/uL (ref 0.00–0.07)
Basophils Absolute: 0 10*3/uL (ref 0.0–0.1)
Basophils Relative: 1 %
Eosinophils Absolute: 0.1 10*3/uL (ref 0.0–0.5)
Eosinophils Relative: 2 %
HCT: 41 % (ref 36.0–46.0)
Hemoglobin: 13.9 g/dL (ref 12.0–15.0)
Immature Granulocytes: 0 %
Lymphocytes Relative: 26 %
Lymphs Abs: 1.1 10*3/uL (ref 0.7–4.0)
MCH: 31.8 pg (ref 26.0–34.0)
MCHC: 33.9 g/dL (ref 30.0–36.0)
MCV: 93.8 fL (ref 80.0–100.0)
Monocytes Absolute: 0.3 10*3/uL (ref 0.1–1.0)
Monocytes Relative: 8 %
Neutro Abs: 2.7 10*3/uL (ref 1.7–7.7)
Neutrophils Relative %: 63 %
Platelets: 90 10*3/uL — ABNORMAL LOW (ref 150–400)
RBC: 4.37 MIL/uL (ref 3.87–5.11)
RDW: 13.4 % (ref 11.5–15.5)
WBC: 4.3 10*3/uL (ref 4.0–10.5)
nRBC: 0 % (ref 0.0–0.2)

## 2020-06-04 LAB — COMPREHENSIVE METABOLIC PANEL
ALT: 13 U/L (ref 0–44)
AST: 22 U/L (ref 15–41)
Albumin: 4.2 g/dL (ref 3.5–5.0)
Alkaline Phosphatase: 58 U/L (ref 38–126)
Anion gap: 12 (ref 5–15)
BUN: 14 mg/dL (ref 8–23)
CO2: 29 mmol/L (ref 22–32)
Calcium: 10.2 mg/dL (ref 8.9–10.3)
Chloride: 102 mmol/L (ref 98–111)
Creatinine, Ser: 0.81 mg/dL (ref 0.44–1.00)
GFR, Estimated: >60 mL/min (ref >60)
Glucose, Bld: 101 mg/dL — ABNORMAL HIGH (ref 70–99)
Potassium: 4.3 mmol/L (ref 3.5–5.1)
Sodium: 143 mmol/L (ref 135–145)
Total Bilirubin: 1 mg/dL (ref 0.3–1.2)
Total Protein: 7.2 g/dL (ref 6.5–8.1)

## 2020-06-04 LAB — LACTATE DEHYDROGENASE: LDH: 162 U/L (ref 98–192)

## 2020-06-04 NOTE — Progress Notes (Signed)
Patient wants to know why she has to keep coming for these appointments. She is having trouble keeping balance.

## 2020-06-06 LAB — PROTEIN ELECTROPHORESIS, SERUM
A/G Ratio: 1.5 (ref 0.7–1.7)
Albumin ELP: 3.9 g/dL (ref 2.9–4.4)
Alpha-1-Globulin: 0.2 g/dL (ref 0.0–0.4)
Alpha-2-Globulin: 0.7 g/dL (ref 0.4–1.0)
Beta Globulin: 0.9 g/dL (ref 0.7–1.3)
Gamma Globulin: 0.9 g/dL (ref 0.4–1.8)
Globulin, Total: 2.6 g/dL (ref 2.2–3.9)
M-Spike, %: 0.3 g/dL — ABNORMAL HIGH
Total Protein ELP: 6.5 g/dL (ref 6.0–8.5)

## 2020-07-13 ENCOUNTER — Emergency Department: Payer: Medicare PPO

## 2020-07-13 ENCOUNTER — Other Ambulatory Visit: Payer: Self-pay

## 2020-07-13 ENCOUNTER — Inpatient Hospital Stay
Admission: EM | Admit: 2020-07-13 | Discharge: 2020-07-16 | DRG: 392 | Disposition: A | Discharge status: Home / Self Care | Payer: Medicare PPO | Attending: Internal Medicine | Admitting: Internal Medicine

## 2020-07-13 DIAGNOSIS — I110 Hypertensive heart disease with heart failure: Secondary | ICD-10-CM | POA: Diagnosis present

## 2020-07-13 DIAGNOSIS — Z20822 Contact with and (suspected) exposure to covid-19: Secondary | ICD-10-CM | POA: Diagnosis present

## 2020-07-13 DIAGNOSIS — Z79890 Hormone replacement therapy (postmenopausal): Secondary | ICD-10-CM

## 2020-07-13 DIAGNOSIS — Z955 Presence of coronary angioplasty implant and graft: Secondary | ICD-10-CM

## 2020-07-13 DIAGNOSIS — I5042 Chronic combined systolic (congestive) and diastolic (congestive) heart failure: Secondary | ICD-10-CM | POA: Diagnosis present

## 2020-07-13 DIAGNOSIS — Z888 Allergy status to other drugs, medicaments and biological substances status: Secondary | ICD-10-CM

## 2020-07-13 DIAGNOSIS — Z8673 Personal history of transient ischemic attack (TIA), and cerebral infarction without residual deficits: Secondary | ICD-10-CM

## 2020-07-13 DIAGNOSIS — I700 Atherosclerosis of aorta: Secondary | ICD-10-CM | POA: Diagnosis present

## 2020-07-13 DIAGNOSIS — I5022 Chronic systolic (congestive) heart failure: Secondary | ICD-10-CM

## 2020-07-13 DIAGNOSIS — Z823 Family history of stroke: Secondary | ICD-10-CM

## 2020-07-13 DIAGNOSIS — I25119 Atherosclerotic heart disease of native coronary artery with unspecified angina pectoris: Secondary | ICD-10-CM | POA: Diagnosis present

## 2020-07-13 DIAGNOSIS — K529 Noninfective gastroenteritis and colitis, unspecified: Principal | ICD-10-CM

## 2020-07-13 DIAGNOSIS — Z8249 Family history of ischemic heart disease and other diseases of the circulatory system: Secondary | ICD-10-CM

## 2020-07-13 DIAGNOSIS — Z83438 Family history of other disorder of lipoprotein metabolism and other lipidemia: Secondary | ICD-10-CM

## 2020-07-13 DIAGNOSIS — M8580 Other specified disorders of bone density and structure, unspecified site: Secondary | ICD-10-CM | POA: Diagnosis present

## 2020-07-13 DIAGNOSIS — Z833 Family history of diabetes mellitus: Secondary | ICD-10-CM

## 2020-07-13 DIAGNOSIS — F411 Generalized anxiety disorder: Secondary | ICD-10-CM | POA: Diagnosis present

## 2020-07-13 DIAGNOSIS — D693 Immune thrombocytopenic purpura: Secondary | ICD-10-CM | POA: Diagnosis present

## 2020-07-13 DIAGNOSIS — E039 Hypothyroidism, unspecified: Secondary | ICD-10-CM | POA: Diagnosis present

## 2020-07-13 DIAGNOSIS — Z8042 Family history of malignant neoplasm of prostate: Secondary | ICD-10-CM

## 2020-07-13 DIAGNOSIS — I10 Essential (primary) hypertension: Secondary | ICD-10-CM | POA: Diagnosis present

## 2020-07-13 DIAGNOSIS — I252 Old myocardial infarction: Secondary | ICD-10-CM

## 2020-07-13 DIAGNOSIS — Z885 Allergy status to narcotic agent status: Secondary | ICD-10-CM

## 2020-07-13 DIAGNOSIS — E872 Acidosis: Secondary | ICD-10-CM | POA: Diagnosis present

## 2020-07-13 DIAGNOSIS — E785 Hyperlipidemia, unspecified: Secondary | ICD-10-CM | POA: Diagnosis present

## 2020-07-13 DIAGNOSIS — Z79899 Other long term (current) drug therapy: Secondary | ICD-10-CM

## 2020-07-13 DIAGNOSIS — Z7982 Long term (current) use of aspirin: Secondary | ICD-10-CM

## 2020-07-13 DIAGNOSIS — Z882 Allergy status to sulfonamides status: Secondary | ICD-10-CM

## 2020-07-13 HISTORY — DX: Noninfective gastroenteritis and colitis, unspecified: K52.9

## 2020-07-13 LAB — CBC WITH DIFFERENTIAL/PLATELET
Abs Immature Granulocytes: 0.04 10*3/uL (ref 0.00–0.07)
Basophils Absolute: 0 10*3/uL (ref 0.0–0.1)
Basophils Relative: 0 %
Eosinophils Absolute: 0.1 10*3/uL (ref 0.0–0.5)
Eosinophils Relative: 1 %
HCT: 43.4 % (ref 36.0–46.0)
Hemoglobin: 15 g/dL (ref 12.0–15.0)
Immature Granulocytes: 0 %
Lymphocytes Relative: 6 %
Lymphs Abs: 0.6 10*3/uL — ABNORMAL LOW (ref 0.7–4.0)
MCH: 32.1 pg (ref 26.0–34.0)
MCHC: 34.6 g/dL (ref 30.0–36.0)
MCV: 92.9 fL (ref 80.0–100.0)
Monocytes Absolute: 0.4 10*3/uL (ref 0.1–1.0)
Monocytes Relative: 4 %
Neutro Abs: 8.8 10*3/uL — ABNORMAL HIGH (ref 1.7–7.7)
Neutrophils Relative %: 89 %
Platelets: 103 10*3/uL — ABNORMAL LOW (ref 150–400)
RBC: 4.67 MIL/uL (ref 3.87–5.11)
RDW: 13.8 % (ref 11.5–15.5)
WBC: 10 10*3/uL (ref 4.0–10.5)
nRBC: 0 % (ref 0.0–0.2)

## 2020-07-13 LAB — COMPREHENSIVE METABOLIC PANEL
ALT: 18 U/L (ref 0–44)
AST: 37 U/L (ref 15–41)
Albumin: 3.9 g/dL (ref 3.5–5.0)
Alkaline Phosphatase: 63 U/L (ref 38–126)
Anion gap: 9 (ref 5–15)
BUN: 16 mg/dL (ref 8–23)
CO2: 26 mmol/L (ref 22–32)
Calcium: 9.7 mg/dL (ref 8.9–10.3)
Chloride: 104 mmol/L (ref 98–111)
Creatinine, Ser: 0.91 mg/dL (ref 0.44–1.00)
GFR, Estimated: >60 mL/min (ref >60)
Glucose, Bld: 160 mg/dL — ABNORMAL HIGH (ref 70–99)
Potassium: 3.4 mmol/L — ABNORMAL LOW (ref 3.5–5.1)
Sodium: 139 mmol/L (ref 135–145)
Total Bilirubin: 1.6 mg/dL — ABNORMAL HIGH (ref 0.3–1.2)
Total Protein: 6.5 g/dL (ref 6.5–8.1)

## 2020-07-13 LAB — LIPASE, BLOOD: Lipase: 38 U/L (ref 11–51)

## 2020-07-13 LAB — TROPONIN I (HIGH SENSITIVITY): Troponin I (High Sensitivity): 8 ng/L (ref <18)

## 2020-07-13 MED ORDER — ONDANSETRON HCL 4 MG/2ML IJ SOLN
4.0000 mg | Freq: Once | INTRAMUSCULAR | Status: AC
  Administered 2020-07-13: 4 mg via INTRAVENOUS
  Filled 2020-07-13: qty 2

## 2020-07-13 MED ORDER — IOHEXOL 300 MG/ML  SOLN
75.0000 mL | Freq: Once | INTRAMUSCULAR | Status: AC | PRN
  Administered 2020-07-13: 75 mL via INTRAVENOUS

## 2020-07-13 MED ORDER — HYDROMORPHONE HCL 1 MG/ML IJ SOLN
0.5000 mg | Freq: Once | INTRAMUSCULAR | Status: AC
  Administered 2020-07-13: 0.5 mg via INTRAVENOUS
  Filled 2020-07-13: qty 1

## 2020-07-13 MED ORDER — FENTANYL CITRATE (PF) 100 MCG/2ML IJ SOLN
50.0000 ug | Freq: Once | INTRAMUSCULAR | Status: AC
  Administered 2020-07-13: 50 ug via INTRAVENOUS
  Filled 2020-07-13: qty 2

## 2020-07-13 NOTE — ED Provider Notes (Addendum)
[facility] Emergency Department Provider Note  ____________________________________________   Event Date/Time   First MD Initiated Contact with Patient 07/13/20 2033     (approximate)  I have reviewed the triage vital signs and the nursing notes.   HISTORY  Chief Complaint Abdominal Pain (Pt arrives from home via [place] --- cc: generalized abd pain which pt attributes to eating seafood approx 1400hrs -- also reports vomiting and dizziness.  4mg  IVP Zofran given by ems with 20G L FA.  BGL 155.  Hypertensive sbp initially 180 - most current sbp per ems 201.  Pt also points out bruising to underside R breast and lower R FA/hand secondary to mech fall one week pta.  )    HPI Teresa [place] is a 87 y.o. female with heart failure, coronary disease, hypertension, hyperlipidemia, stroke, history of subdural hemorrhage who comes in for abdominal pain.  Patient reports having sudden onset of severe abdominal pain throughout her abdomen, constant, nothing makes it better, nothing makes it worse.'s been associate with some nausea and vomiting.  Patient does report a fall a week ago but states that she did not hit her head.  Did not lose consciousness.  Denies any urinary symptoms          Past Medical History:  Diagnosis Date  . Anxiety   . AV block    a. 08/2015 in setting of NSTEMI and RPAV intervention-->required temp wire but not PPM.  . Back pain   . Chronic combined systolic (congestive) and diastolic (congestive) heart failure ([place])    a. 08/2015  Echo: EF 55-60%, no rwma, mod MR, mildly dil LA/RA, mod TR, PASP 59mmHg; b. 05/2016 Echo: EF 30-35%, inflat/inf sev HK, Gr1 DD; c. 08/2017 Echo: EF 30-35%, mild LVH. Sev inf/infseptal HK. Gr1 DD. Mod MR. mildly dil LA. Mildly reduced RV fxn.  . Coronary artery disease    a. 08/2015 NSTEMI/PCI: LM nl, LAD 75p (med rx), 20 diffuse, 50d, LCX nl, OM1/2 nl, RCA 8m, RPAV 100 (2.25x12 Promus Premier DES) - complicated by  2:1 HB req temp wire post-PCI; b. 04/2016 MV: EF 34%, lateral HK/infarct. No ischemia.  . Diverticulitis   . Generalized anxiety disorder   . Hepatic steatosis   . HLD (hyperlipidemia)   . Hypertension   . Hypokalemia   . Hypomagnesemia   . Hypothyroidism   . Ischemic cardiomyopathy    a. 08/2015  Echo: EF 55-60%; b. 05/2016 Echo: EF 30-35%; c. 08/2017 Echo: EF 30-35%.  . OA (osteoarthritis)   . Osteopenia   . Sleep apnea   . Stroke [facility])    a. 08/2015 Right PCA territory infarct/temoral lobe cerebral infarction-->MRA showed severe flow liminting stenosis of R P2 cerebral artery;  b. 08/2015 Carotid U/S: 1-39% bilat ICA stenosis.  . Subdural hemorrhage ([place])    a. 05/2013 in setting of fall.  . Thrombocytopenia ([place])   . Weakness     Patient Active Problem List   Diagnosis Date Noted  . Chronic ITP (idiopathic thrombocytopenic purpura) (HCC) 12/06/2019  . Monoclonal gammopathy 12/02/2018  . Coronary artery disease involving native coronary artery of native heart with angina pectoris ([place])   . Anxiety disorder 08/05/2016  . Preop cardiovascular exam   . Elbow fracture, right 07/07/2016  . Thrombocytopenia ([place]) 11/12/2015  . Diastolic CHF, acute on chronic ([place]) 09/17/2015  . SOB (shortness of breath)   . UTI (urinary tract infection) 09/16/2015  . Acute right MCA stroke ([place])   .  Fall   . Hypokalemia 09/13/2015  . Hypophosphatemia 09/12/2015  . Encounter for intubation   . Acute respiratory failure ([place])   . Complete heart block ([place]) 09/08/2015  . Acute blood loss anemia -thought to be related to in her muscular injection of Phenergan 09/08/2015  . Acute respiratory failure with hypoxia ([place]) 09/08/2015  . Heart block AV second degree   . Essential hypertension   . Arterial hypotension   . Cardiogenic shock ([place])   . NSTEMI (non-ST elevated myocardial infarction) ([place]) 09/06/2015  . Subdural hemorrhage (HCC) -history of     Class: History of  . Hyperlipidemia with target  LDL less than 70   . Hepatic steatosis     Past Surgical History:  Procedure Laterality Date  . CARDIAC CATHETERIZATION N/A 09/08/2015   Procedure: Temporary Pacemaker;  Surgeon: Leonie Man, MD;  Location: [place] [facility];  Service: Cardiovascular;  Laterality: N/A;  . CARDIAC CATHETERIZATION N/A 09/08/2015   Procedure: Left Heart Cath and Coronary Angiography;  Surgeon: Leonie Man, MD;  Location: [place] [facility];  Service: Cardiovascular;  Laterality: N/A;  . CARDIAC CATHETERIZATION N/A 09/08/2015   Procedure: Coronary Stent Intervention;  Surgeon: Leonie Man, MD;  Location: [place] [facility];  Service: Cardiovascular;  Laterality: N/A;  . CARDIAC CATHETERIZATION  09/08/2015   Procedure: Central Line Insertion;  Surgeon: Leonie Man, MD;  Location: [place] [facility];  Service: Cardiovascular;;  . CARDIAC CATHETERIZATION  09/08/2015   Procedure: Arterial Line Insertion;  Surgeon: Leonie Man, MD;  Location: [place] [facility];  Service: Cardiovascular;;  . CARDIAC CATHETERIZATION N/A 09/09/2015   Procedure: Temporary Wire;  Surgeon: Lorretta Harp, MD;  Location: [place] [facility];  Service: Cardiovascular;  Laterality: N/A;  . ELBOW SURGERY    . LEFT HEART CATH AND CORONARY ANGIOGRAPHY N/A 09/22/2017   Procedure: LEFT HEART CATH AND CORONARY ANGIOGRAPHY;  Surgeon: [name], MD;  Location: [place] [facility];  Service: Cardiovascular;  Laterality: N/A;    Prior to Admission medications   Medication Sig Start Date End Date Taking? Authorizing Provider  acetaminophen (TYLENOL) 325 MG tablet Take 650 mg by mouth every 6 (six) hours as needed for mild pain, fever or headache.     [provider]  ALPRAZolam Duanne Moron) 0.25 MG tablet Take 1 tablet (0.25 mg total) by mouth at bedtime as needed for anxiety. 07/10/16   Gladstone Lighter, MD  aspirin 81 MG chewable tablet Chew 1 tablet (81 mg total) by mouth daily. 09/13/15   Lyda Jester M, PA-C   atorvastatin (LIPITOR) 20 MG tablet Take 1 tablet (20 mg total) by mouth daily. 09/30/15   [name], MD  calcium-vitamin D 250-100 MG-UNIT tablet Take 1 tablet by mouth 2 (two) times daily.    [provider]  carvedilol (COREG) 6.25 MG tablet TAKE (1) TABLET BY MOUTH TWICE DAILY 10/17/18   Dunn, Areta Haber, PA-C  Coenzyme Q10 (CO Q 10 PO) Take by mouth daily.    [provider]  folic acid-pyridoxine-cyancobalamin (FOLBIC) 2.5-25-2 MG TABS tablet Take 1 tablet by mouth daily.  Patient not taking: Reported on 06/04/2020 12/13/18   [provider]  hydroxypropyl methylcellulose / hypromellose (ISOPTO TEARS / GONIOVISC) 2.5 % ophthalmic solution Place 1 drop into both eyes 4 (four) times daily as needed for dry eyes.    [provider]  levothyroxine (SYNTHROID, LEVOTHROID) 25 MCG tablet Take 25 mcg by mouth daily before  breakfast.     [provider]  lisinopril (PRINIVIL,ZESTRIL) 40 MG tablet Take 20 mg by mouth daily.     [provider]  Multiple Vitamins-Minerals (MULTIVITAMIN GUMMIES ADULT) CHEW Chew 2 each by mouth at bedtime.    [provider]  Multiple Vitamins-Minerals (PRESERVISION AREDS 2) CAPS Take 1 capsule by mouth daily.    [provider]  omeprazole (PRILOSEC) 20 MG capsule Take 20 mg by mouth daily before breakfast.     [provider]  oxybutynin (DITROPAN) 5 MG tablet Take 1 tablet by mouth 2 (two) times daily. 05/23/19   [provider]  polyethylene glycol (MIRALAX / GLYCOLAX) packet Take 17 g by mouth daily as needed for mild constipation. 07/10/16   Gladstone Lighter, MD  Simethicone 80 MG TABS Take 80 mg by mouth daily as needed (gas).     [provider]  nitroGLYCERIN (NITROSTAT) 0.4 MG SL tablet Place 1 tablet (0.4 mg total) under the tongue every 5 (five) minutes x 3 doses as needed for chest pain. Patient not taking: Reported on 12/02/2018 09/13/15 01/09/19  Lyda Jester M, PA-C    Allergies Codeine, Flomax [tamsulosin], Norco [hydrocodone-acetaminophen], and Sulfa antibiotics  Family History  Problem Relation Age of Onset  . Hypertension Mother   . Hyperlipidemia Mother   . Stroke Mother   . Diabetes Sister   . Hypertension Sister   . Hyperlipidemia Sister   . Bipolar disorder Sister   . Hyperlipidemia Sister   . Stroke Sister   . Prostate cancer Brother   . Breast cancer Neg Hx     Social History Social History   Tobacco Use  . Smoking status: Never Smoker  . Smokeless tobacco: Never Used  Substance Use Topics  . Alcohol use: No    Alcohol/week: 0.0 standard drinks  . Drug use: No      Review of Systems Constitutional: No fever/chills, positive for, + fall  Eyes: No visual changes. ENT: No sore throat. Cardiovascular: Denies chest pain. Respiratory: Denies shortness of breath. Gastrointestinal: Positive abdominal pain Genitourinary: Negative for dysuria. Musculoskeletal: Negative for back pain. Skin: Negative for rash. Old bruising  Neurological: Negative for headaches, focal weakness or numbness. All other ROS negative ____________________________________________   PHYSICAL EXAM:  VITAL SIGNS: Blood pressure (!) 169/82, pulse 84, temperature 98.2 F (36.8 C), temperature source Oral, resp. rate 14, height 5\' 2"  (1.575 m), weight 49.9 kg, SpO2 100 %.  Constitutional: Alert and oriented. Well appearing and in no acute distress. Eyes: Conjunctivae are normal. EOMI. Head: Atraumatic. Nose: No congestion/rhinnorhea. Mouth/Throat: Mucous membranes are moist.   Neck: No stridor. Trachea Midline. FROM Cardiovascular: Normal rate, regular rhythm. Grossly normal heart sounds.  Good peripheral circulation.  Old bruising on her right chest wall without any tenderness Respiratory: Normal respiratory effort.  No retractions. Lungs CTAB. Gastrointestinal: , No distention. Tender throughout her abdomen. Musculoskeletal:  No lower extremity tenderness nor edema.  No joint effusions. Neurologic:  Normal speech and language. No gross focal neurologic deficits are appreciated.  Skin:  Skin is warm, dry and intact. No rash noted. Psychiatric: Mood and affect are normal. Speech and behavior are normal. GU: Deferred   ____________________________________________   LABS (all labs ordered are listed, but only abnormal results are displayed)  Labs Reviewed  CBC WITH DIFFERENTIAL/PLATELET - Abnormal; Notable for the following components:      Result Value   Platelets 103 (*)    Neutro Abs 8.8 (*)    Lymphs  Abs 0.6 (*)    All other components within normal limits  COMPREHENSIVE METABOLIC PANEL - Abnormal; Notable for the following components:   Potassium 3.4 (*)    Glucose, Bld 160 (*)    Total Bilirubin 1.6 (*)    All other components within normal limits  GASTROINTESTINAL PANEL BY PCR, STOOL (REPLACES STOOL CULTURE)  C DIFFICILE QUICK SCREEN W PCR REFLEX  LIPASE, BLOOD  URINALYSIS, COMPLETE (UACMP) WITH MICROSCOPIC  LACTIC ACID, PLASMA  LACTIC ACID, PLASMA  TROPONIN I (HIGH SENSITIVITY)  TROPONIN I (HIGH SENSITIVITY)   ____________________________________________   ED ECG REPORT I, Vanessa [place], the attending physician, personally viewed and interpreted this ECG.  Normal sinus rate of 84, no ST elevation, T wave inversion in V6, normal intervals ____________________________________________  RADIOLOGY  Official radiology report(s): CT Head Wo Contrast  Result Date: 07/13/2020 CLINICAL DATA:  Minor head trauma. EXAM: CT HEAD WITHOUT CONTRAST TECHNIQUE: Contiguous axial images were obtained from the base of the skull through the vertex without intravenous contrast. COMPARISON:  CT head 09/13/2015, MRI head 09/15/2015 FINDINGS: Brain: Patchy and confluent areas of decreased attenuation are noted throughout the deep and periventricular white matter of the cerebral hemispheres bilaterally, compatible  with chronic microvascular ischemic disease. Similar-appearing right temporal lobe encephalomalacia. No evidence of large-territorial acute infarction. No parenchymal hemorrhage. No mass lesion. No extra-axial collection. No mass effect or midline shift. No hydrocephalus. Basilar cisterns are patent. Vascular: No hyperdense vessel. Skull: No acute fracture or focal lesion. Sinuses/Orbits: Paranasal sinuses and mastoid air cells are clear. The orbits are unremarkable. Other: None. IMPRESSION: No acute intracranial abnormality. Electronically Signed   By: Iven Finn M.D.   On: 07/13/2020 22:46   CT ABDOMEN PELVIS W CONTRAST  Result Date: 07/13/2020 CLINICAL DATA:  Abdominal distension. EXAM: CT ABDOMEN AND PELVIS WITH CONTRAST TECHNIQUE: Multidetector CT imaging of the abdomen and pelvis was performed using the standard protocol following bolus administration of intravenous contrast. CONTRAST:  21mL OMNIPAQUE IOHEXOL 300 MG/ML  SOLN COMPARISON:  None. FINDINGS: Lower chest: Linear atelectasis versus scarring within the left lower lobe. Coronary artery calcifications. Hepatobiliary: No focal liver abnormality. No gallstones, gallbladder wall thickening, or pericholecystic fluid. No biliary dilatation. Pancreas: No focal lesion. Normal pancreatic contour. No surrounding inflammatory changes. No main pancreatic ductal dilatation. Spleen: Borderline enlarged spleen measuring up to 13 cm. Couple of subcentimeter hypodensities too small to characterize (2:24, 27). Adrenals/Urinary Tract: No adrenal nodule bilaterally. Bilateral kidneys enhance symmetrically. Several subcentimeter hypodensities are too small to characterize. A 1.5 cm fluid density lesion within the right kidney likely represents a simple renal cyst. No hydronephrosis. No hydroureter. The urinary bladder is unremarkable. Stomach/Bowel: Stomach is within normal limits. No evidence of bowel wall thickening or dilatation. Fluid density within the lumen  of the large bowel. Mild large bowel wall thickening and haziness of the distal transverse colon and left colon. Diffuse colonic diverticulosis. The appendix is not definitely identified. Vascular/Lymphatic: Main portal, splenic, superior mesenteric veins are patent. No abdominal aorta or iliac aneurysm. Severe calcified and noncalcified atherosclerotic plaque of the aorta and its branches. No abdominal, pelvic, or inguinal lymphadenopathy. Reproductive: Not well visualized due to streak artifact originating from the right femoral surgical hardware. Uterus and bilateral adnexal regions are grossly unremarkable. Other: Trace free fluid within the pelvis. No intraperitoneal free gas. No organized fluid collection. Musculoskeletal: No abdominal wall hernia or abnormality. No suspicious lytic or blastic osseous lesions. No acute displaced fracture. Old healed right pelvic fracture. Multilevel degenerative  changes of the spine in a patient with this extra scoliosis centered at the L2-L3 level. Total right hip arthroplasty partially visualized. IMPRESSION: 1. Colitis of the distal transverse colon and left colon. Differential diagnosis for etiology include ischemia, infection, inflammation. 2. Fast transition state. 3. Diffuse colonic diverticulosis with no acute diverticulitis. 4. Mild splenomegaly. 5.  Aortic Atherosclerosis (ICD10-I70.0). Electronically Signed   By: Iven Finn M.D.   On: 07/13/2020 23:07    ____________________________________________   PROCEDURES  Procedure(s) performed (including Critical Care):  .1-3 Lead EKG Interpretation Performed by: Vanessa [place], MD Authorized by: Vanessa [place], MD     Interpretation: normal     ECG rate:  80s    ECG rate assessment: normal     Rhythm: sinus rhythm     Ectopy: none     Conduction: normal       ____________________________________________   INITIAL IMPRESSION / ASSESSMENT AND PLAN / ED COURSE  Teresa Franklin was evaluated in  Emergency Department on 07/13/2020 for the symptoms described in the history of present illness. She was evaluated in the context of the global COVID-19 pandemic, which necessitated consideration that the patient might be at risk for infection with the SARS-CoV-2 virus that causes COVID-19. Institutional protocols and algorithms that pertain to the evaluation of patients at risk for COVID-19 are in a state of rapid change based on information released by regulatory bodies including the CDC and federal and state organizations. These policies and algorithms were followed during the patient's care in the ED.    Patient is an 87 year old who comes in with abdominal pain.  Patient diffusely tender on exam.  Will get CT scan to evaluate for obstruction, colitis, diverticulitis, appendicitis or other acute pathology.  Will get EKG and keep patient the cardiac monitor to evaluate for cardiopulmonary decompensation.  Patient has a little bit of bruising on her right chest wall but no tenderness at this time it was a fall a week ago.  We will get a CT head just to make sure no evidence of delayed subdural hematoma given prior history of this.  Labs are reassuring at her baseline for her.  CT head was negative however CT scanning of the abdomen and pelvis was concerning for colitis.  Added on lactate to evaluate for ischemic colitis.  Patient on repeat evaluation continues to have some discomfort so given some Dilaudid.  There is no bloody stools to suggest ischemic colitis but again will evaluate with the lactate.  Patient did just have a very watery bowel movement so I added on stool studies as well as C. difficile to further evaluate for infectious process.  We will hold off on antibiotics at this time.  Given patient's continued pain and CT imaging will discuss possible team for admission.  Lactate was 2.1.  Discussed with Dr. Hampton Abbot who recommended hydration and he reviewed the CT scan and did not see any signs of a  clot.  Recommended trending out the lactates and stool studies but this time a low suspicion for ischemic colitis.  He recommend admitting to the hospital team        ____________________________________________   FINAL CLINICAL IMPRESSION(S) / ED DIAGNOSES   Final diagnoses:  Colitis      MEDICATIONS GIVEN DURING THIS VISIT:  Medications  HYDROmorphone (DILAUDID) injection 0.5 mg (has no administration in time range)  fentaNYL (SUBLIMAZE) injection 50 mcg (50 mcg Intravenous Given 07/13/20 2059)  ondansetron (ZOFRAN) injection 4 mg (4 mg  Intravenous Given 07/13/20 2059)  iohexol (OMNIPAQUE) 300 MG/ML solution 75 mL (75 mLs Intravenous Contrast Given 07/13/20 2226)     ED Discharge Orders    None       Note:  This document was prepared using Dragon voice recognition software and may include unintentional dictation errors.   Vanessa [place], MD 07/13/20 2340    Vanessa [place], MD 07/14/20 [phone]

## 2020-07-14 ENCOUNTER — Encounter: Payer: Self-pay | Admitting: Internal Medicine

## 2020-07-14 DIAGNOSIS — K529 Noninfective gastroenteritis and colitis, unspecified: Principal | ICD-10-CM

## 2020-07-14 DIAGNOSIS — Z888 Allergy status to other drugs, medicaments and biological substances status: Secondary | ICD-10-CM | POA: Diagnosis not present

## 2020-07-14 DIAGNOSIS — E785 Hyperlipidemia, unspecified: Secondary | ICD-10-CM | POA: Diagnosis present

## 2020-07-14 DIAGNOSIS — Z955 Presence of coronary angioplasty implant and graft: Secondary | ICD-10-CM | POA: Diagnosis not present

## 2020-07-14 DIAGNOSIS — E872 Acidosis: Secondary | ICD-10-CM | POA: Diagnosis present

## 2020-07-14 DIAGNOSIS — I10 Essential (primary) hypertension: Secondary | ICD-10-CM | POA: Diagnosis not present

## 2020-07-14 DIAGNOSIS — Z7982 Long term (current) use of aspirin: Secondary | ICD-10-CM | POA: Diagnosis not present

## 2020-07-14 DIAGNOSIS — Z20822 Contact with and (suspected) exposure to covid-19: Secondary | ICD-10-CM | POA: Diagnosis present

## 2020-07-14 DIAGNOSIS — I25119 Atherosclerotic heart disease of native coronary artery with unspecified angina pectoris: Secondary | ICD-10-CM | POA: Diagnosis not present

## 2020-07-14 DIAGNOSIS — Z833 Family history of diabetes mellitus: Secondary | ICD-10-CM | POA: Diagnosis not present

## 2020-07-14 DIAGNOSIS — E039 Hypothyroidism, unspecified: Secondary | ICD-10-CM | POA: Diagnosis not present

## 2020-07-14 DIAGNOSIS — Z8249 Family history of ischemic heart disease and other diseases of the circulatory system: Secondary | ICD-10-CM | POA: Diagnosis not present

## 2020-07-14 DIAGNOSIS — Z79899 Other long term (current) drug therapy: Secondary | ICD-10-CM | POA: Diagnosis not present

## 2020-07-14 DIAGNOSIS — I5042 Chronic combined systolic (congestive) and diastolic (congestive) heart failure: Secondary | ICD-10-CM | POA: Diagnosis present

## 2020-07-14 DIAGNOSIS — Z8673 Personal history of transient ischemic attack (TIA), and cerebral infarction without residual deficits: Secondary | ICD-10-CM | POA: Diagnosis not present

## 2020-07-14 DIAGNOSIS — Z882 Allergy status to sulfonamides status: Secondary | ICD-10-CM | POA: Diagnosis not present

## 2020-07-14 DIAGNOSIS — I700 Atherosclerosis of aorta: Secondary | ICD-10-CM | POA: Diagnosis present

## 2020-07-14 DIAGNOSIS — Z823 Family history of stroke: Secondary | ICD-10-CM | POA: Diagnosis not present

## 2020-07-14 DIAGNOSIS — D693 Immune thrombocytopenic purpura: Secondary | ICD-10-CM | POA: Diagnosis not present

## 2020-07-14 DIAGNOSIS — Z885 Allergy status to narcotic agent status: Secondary | ICD-10-CM | POA: Diagnosis not present

## 2020-07-14 DIAGNOSIS — M8580 Other specified disorders of bone density and structure, unspecified site: Secondary | ICD-10-CM | POA: Diagnosis present

## 2020-07-14 DIAGNOSIS — I110 Hypertensive heart disease with heart failure: Secondary | ICD-10-CM | POA: Diagnosis present

## 2020-07-14 DIAGNOSIS — Z79890 Hormone replacement therapy (postmenopausal): Secondary | ICD-10-CM | POA: Diagnosis not present

## 2020-07-14 DIAGNOSIS — F411 Generalized anxiety disorder: Secondary | ICD-10-CM | POA: Diagnosis present

## 2020-07-14 DIAGNOSIS — I252 Old myocardial infarction: Secondary | ICD-10-CM | POA: Diagnosis not present

## 2020-07-14 LAB — URINALYSIS, COMPLETE (UACMP) WITH MICROSCOPIC
Bacteria, UA: NONE SEEN
Bilirubin Urine: NEGATIVE
Glucose, UA: NEGATIVE mg/dL
Ketones, ur: NEGATIVE mg/dL
Leukocytes,Ua: NEGATIVE
Nitrite: NEGATIVE
Protein, ur: NEGATIVE mg/dL
Specific Gravity, Urine: 1.032 — ABNORMAL HIGH (ref 1.005–1.030)
pH: 6 (ref 5.0–8.0)

## 2020-07-14 LAB — TROPONIN I (HIGH SENSITIVITY)
Troponin I (High Sensitivity): 8 ng/L (ref <18)
Troponin I (High Sensitivity): 8 ng/L (ref <18)
Troponin I (High Sensitivity): 9 ng/L (ref <18)

## 2020-07-14 LAB — GASTROINTESTINAL PANEL BY PCR, STOOL (REPLACES STOOL CULTURE)

## 2020-07-14 LAB — C DIFFICILE QUICK SCREEN W PCR REFLEX
C Diff antigen: NEGATIVE
C Diff interpretation: NOT DETECTED
C Diff toxin: NEGATIVE

## 2020-07-14 LAB — CBC
HCT: 40.3 % (ref 36.0–46.0)
Hemoglobin: 13.8 g/dL (ref 12.0–15.0)
MCH: 31.8 pg (ref 26.0–34.0)
MCHC: 34.2 g/dL (ref 30.0–36.0)
MCV: 92.9 fL (ref 80.0–100.0)
Platelets: 86 10*3/uL — ABNORMAL LOW (ref 150–400)
RBC: 4.34 MIL/uL (ref 3.87–5.11)
RDW: 13.6 % (ref 11.5–15.5)
WBC: 9.1 10*3/uL (ref 4.0–10.5)
nRBC: 0 % (ref 0.0–0.2)

## 2020-07-14 LAB — BASIC METABOLIC PANEL
Anion gap: 8 (ref 5–15)
BUN: 16 mg/dL (ref 8–23)
CO2: 27 mmol/L (ref 22–32)
Calcium: 9.3 mg/dL (ref 8.9–10.3)
Chloride: 106 mmol/L (ref 98–111)
Creatinine, Ser: 0.82 mg/dL (ref 0.44–1.00)
GFR, Estimated: >60 mL/min (ref >60)
Glucose, Bld: 137 mg/dL — ABNORMAL HIGH (ref 70–99)
Potassium: 4.8 mmol/L (ref 3.5–5.1)
Sodium: 141 mmol/L (ref 135–145)

## 2020-07-14 LAB — LACTIC ACID, PLASMA
Lactic Acid, Venous: 1.7 mmol/L (ref 0.5–1.9)
Lactic Acid, Venous: 2.1 mmol/L (ref 0.5–1.9)
Lactic Acid, Venous: 2.2 mmol/L (ref 0.5–1.9)
Lactic Acid, Venous: 3.2 mmol/L (ref 0.5–1.9)

## 2020-07-14 LAB — RESP PANEL BY RT-PCR (FLU A&B, COVID) ARPGX2
Influenza A by PCR: NEGATIVE
Influenza B by PCR: NEGATIVE
SARS Coronavirus 2 by RT PCR: NEGATIVE

## 2020-07-14 LAB — TSH: TSH: 0.593 u[IU]/mL (ref 0.350–4.500)

## 2020-07-14 MED ORDER — ACETAMINOPHEN 650 MG RE SUPP: 650.0000 mg | Freq: Four times a day (QID) | RECTAL | Status: DC | PRN

## 2020-07-14 MED ORDER — LACTATED RINGERS IV SOLN: INTRAVENOUS | Status: DC

## 2020-07-14 MED ORDER — CARVEDILOL 6.25 MG PO TABS
3.1250 mg | ORAL_TABLET | Freq: Two times a day (BID) | ORAL | Status: DC
  Administered 2020-07-14 – 2020-07-16 (×5): 3.125 mg via ORAL
  Filled 2020-07-14 (×5): qty 1

## 2020-07-14 MED ORDER — SODIUM CHLORIDE 0.9 % IV BOLUS
500.0000 mL | Freq: Once | INTRAVENOUS | Status: AC
  Administered 2020-07-14: 500 mL via INTRAVENOUS

## 2020-07-14 MED ORDER — ENOXAPARIN SODIUM 40 MG/0.4ML ~~LOC~~ SOLN
40.0000 mg | SUBCUTANEOUS | Status: DC
  Administered 2020-07-14 – 2020-07-15 (×2): 40 mg via SUBCUTANEOUS
  Filled 2020-07-14 (×2): qty 0.4

## 2020-07-14 MED ORDER — MORPHINE SULFATE (PF) 2 MG/ML IV SOLN: 2.0000 mg | INTRAVENOUS | Status: DC | PRN

## 2020-07-14 MED ORDER — ONDANSETRON HCL 4 MG PO TABS: 4.0000 mg | ORAL_TABLET | Freq: Four times a day (QID) | ORAL | Status: DC | PRN

## 2020-07-14 MED ORDER — LABETALOL HCL 5 MG/ML IV SOLN
5.0000 mg | Freq: Once | INTRAVENOUS | Status: AC
  Administered 2020-07-14: 5 mg via INTRAVENOUS
  Filled 2020-07-14: qty 4

## 2020-07-14 MED ORDER — ACETAMINOPHEN 325 MG PO TABS: 650.0000 mg | ORAL_TABLET | Freq: Four times a day (QID) | ORAL | Status: DC | PRN

## 2020-07-14 MED ORDER — ONDANSETRON HCL 4 MG/2ML IJ SOLN: 4.0000 mg | Freq: Four times a day (QID) | INTRAMUSCULAR | Status: DC | PRN

## 2020-07-14 NOTE — ED Notes (Signed)
Pt sons Austynn Prid[place] number is [phone].

## 2020-07-14 NOTE — Consult Note (Signed)
Date of Consultation:  07/14/2020  Requesting Physician:  Marjean Donna, MD  Reason for Consultation:  Colitis  History of Present Illness: Teresa Franklin is a 87 y.o. female admitted overnight with sudden onset of abdominal pain, nausea, vomiting, and diarrhea yesterday afternoon.  The patient reports that yesterday she went to a restaurant and had fish for lunch.  She usually walks around her neighborhood every day and yesterday during her walk, she started getting nauseous.  Shortly after, she started having vomiting, and abdominal pain in the left side with associated diarrhea and pressure.  She presented to the hospital last night.  In the emergency room, her work-up showed a white blood cell count of 10.0, with lactic acid of 2.1.  She also had a CT scan of the abdomen and pelvis which showed thickening of the distal transverse colon and descending colon consistent with colitis.  Differential included inflammatory, infectious, or ischemic in nature.  Given the potential for ischemic colitis, general surgery was consulted for further evaluation.  Overall she had no fevers, chills, chest pain, shortness of breath.  Denies any blood in her stool.  This was tested for GI panel which was negative as well as C. difficile which was negative.  Overnight, she was given IV fluids and this morning her lactic acid is improved to 1.7 and she denies any further abdominal pain.  She continues to have loose stools but otherwise has no pain or nausea at this point.  Past Medical History: Past Medical History:  Diagnosis Date  . Anxiety   . AV block    a. 08/2015 in setting of NSTEMI and RPAV intervention-->required temp wire but not PPM.  . Back pain   . Chronic combined systolic (congestive) and diastolic (congestive) heart failure ([place])    a. 08/2015  Echo: EF 55-60%, no rwma, mod MR, mildly dil LA/RA, mod TR, PASP 45mmHg; b. 05/2016 Echo: EF 30-35%, inflat/inf sev HK, Gr1 DD; c. 08/2017 Echo: EF 30-35%, mild LVH.  Sev inf/infseptal HK. Gr1 DD. Mod MR. mildly dil LA. Mildly reduced RV fxn.  . Coronary artery disease    a. 08/2015 NSTEMI/PCI: LM nl, LAD 75p (med rx), 20 diffuse, 50d, LCX nl, OM1/2 nl, RCA 71m, RPAV 100 (2.25x12 Promus Premier DES) - complicated by 2:1 HB req temp wire post-PCI; b. 04/2016 MV: EF 34%, lateral HK/infarct. No ischemia.  . Diverticulitis   . Generalized anxiety disorder   . Hepatic steatosis   . HLD (hyperlipidemia)   . Hypertension   . Hypokalemia   . Hypomagnesemia   . Hypothyroidism   . Ischemic cardiomyopathy    a. 08/2015  Echo: EF 55-60%; b. 05/2016 Echo: EF 30-35%; c. 08/2017 Echo: EF 30-35%.  . OA (osteoarthritis)   . Osteopenia   . Sleep apnea   . Stroke [facility])    a. 08/2015 Right PCA territory infarct/temoral lobe cerebral infarction-->MRA showed severe flow liminting stenosis of R P2 cerebral artery;  b. 08/2015 Carotid U/S: 1-39% bilat ICA stenosis.  . Subdural hemorrhage ([place])    a. 05/2013 in setting of fall.  . Thrombocytopenia ([place])   . Weakness      Past Surgical History: Past Surgical History:  Procedure Laterality Date  . CARDIAC CATHETERIZATION N/A 09/08/2015   Procedure: Temporary Pacemaker;  Surgeon: Leonie Man, MD;  Location: [place] [facility];  Service: Cardiovascular;  Laterality: N/A;  . CARDIAC CATHETERIZATION N/A 09/08/2015   Procedure: Left Heart Cath and Coronary Angiography;  Surgeon: Leonie Green  Ellyn Hack, MD;  Location: [place] [facility];  Service: Cardiovascular;  Laterality: N/A;  . CARDIAC CATHETERIZATION N/A 09/08/2015   Procedure: Coronary Stent Intervention;  Surgeon: Leonie Man, MD;  Location: [place] [facility];  Service: Cardiovascular;  Laterality: N/A;  . CARDIAC CATHETERIZATION  09/08/2015   Procedure: Central Line Insertion;  Surgeon: Leonie Man, MD;  Location: [place] [facility];  Service: Cardiovascular;;  . CARDIAC CATHETERIZATION  09/08/2015   Procedure: Arterial Line Insertion;  Surgeon: Leonie Man, MD;   Location: [place] [facility];  Service: Cardiovascular;;  . CARDIAC CATHETERIZATION N/A 09/09/2015   Procedure: Temporary Wire;  Surgeon: Lorretta Harp, MD;  Location: [place] [facility];  Service: Cardiovascular;  Laterality: N/A;  . ELBOW SURGERY    . LEFT HEART CATH AND CORONARY ANGIOGRAPHY N/A 09/22/2017   Procedure: LEFT HEART CATH AND CORONARY ANGIOGRAPHY;  Surgeon: [name], MD;  Location: [place] [facility];  Service: Cardiovascular;  Laterality: N/A;    Home Medications: Prior to Admission medications   Medication Sig Start Date End Date Taking? Authorizing Provider  ALPRAZolam (XANAX) 0.25 MG tablet Take 1 tablet (0.25 mg total) by mouth at bedtime as needed for anxiety. 07/10/16  Yes Gladstone Lighter, MD  aspirin 81 MG chewable tablet Chew 1 tablet (81 mg total) by mouth daily. 09/13/15  Yes Simmons, Brittainy M, PA-C  carvedilol (COREG) 6.25 MG tablet TAKE (1) TABLET BY MOUTH TWICE DAILY 10/17/18  Yes Dunn, Areta Haber, PA-C  Coenzyme Q10 (CO Q 10 PO) Take by mouth daily.   Yes [provider]  folic acid-pyridoxine-cyancobalamin (FOLBIC) 2.5-25-2 MG TABS tablet Take 1 tablet by mouth daily. 12/13/18  Yes [provider]  hydroxypropyl methylcellulose / hypromellose (ISOPTO TEARS / GONIOVISC) 2.5 % ophthalmic solution Place 1 drop into both eyes 4 (four) times daily as needed for dry eyes.   Yes [provider]  levothyroxine (SYNTHROID, LEVOTHROID) 25 MCG tablet Take 25 mcg by mouth daily before breakfast.    Yes [provider]  lisinopril (PRINIVIL,ZESTRIL) 40 MG tablet Take 20 mg by mouth daily.    Yes [provider]  Multiple Vitamins-Minerals (MULTIVITAMIN GUMMIES ADULT) CHEW Chew 2 each by mouth at bedtime.   Yes [provider]  Multiple Vitamins-Minerals (PRESERVISION AREDS 2) CAPS Take 1 capsule by mouth daily.   Yes [provider]  omeprazole (PRILOSEC) 20 MG capsule Take 20 mg by mouth daily before  breakfast.    Yes [provider]  oxybutynin (DITROPAN) 5 MG tablet Take 1 tablet by mouth 2 (two) times daily. 05/23/19  Yes [provider]  acetaminophen (TYLENOL) 325 MG tablet Take 650 mg by mouth every 6 (six) hours as needed for mild pain, fever or headache.     [provider]  atorvastatin (LIPITOR) 20 MG tablet Take 1 tablet (20 mg total) by mouth daily. Patient not taking: Reported on 07/14/2020 09/30/15   [name], MD  calcium-vitamin D 250-100 MG-UNIT tablet Take 1 tablet by mouth 2 (two) times daily. Patient not taking: No sig reported    [provider]  polyethylene glycol (MIRALAX / GLYCOLAX) packet Take 17 g by mouth daily as needed for mild constipation. 07/10/16   Gladstone Lighter, MD  Simethicone 80 MG TABS Take 80 mg by mouth daily as needed (gas).     [provider]  nitroGLYCERIN (NITROSTAT) 0.4 MG SL tablet Place 1 tablet (0.4 mg total) under the tongue every  5 (five) minutes x 3 doses as needed for chest pain. Patient not taking: Reported on 12/02/2018 09/13/15 01/09/19  Consuelo Pandy, PA-C    Allergies: Allergies  Allergen Reactions  . Codeine Nausea And Vomiting  . Flomax [Tamsulosin] Other (See Comments)    Pt states that this medication gave her a kidney infection.    Lebron Quam [Hydrocodone-Acetaminophen] Nausea And Vomiting  . Sulfa Antibiotics Nausea And Vomiting  . Atorvastatin Other (See Comments)    Leg pain, leg weakness    Social History:  reports that she has never smoked. She has never used smokeless tobacco. She reports that she does not drink alcohol and does not use drugs.   Family History: Family History  Problem Relation Age of Onset  . Hypertension Mother   . Hyperlipidemia Mother   . Stroke Mother   . Diabetes Sister   . Hypertension Sister   . Hyperlipidemia Sister   . Bipolar disorder Sister   . Hyperlipidemia Sister   . Stroke Sister   . Prostate cancer Brother   . Breast  cancer Neg Hx     Review of Systems: Review of Systems  Constitutional: Negative for chills and fever.  HENT: Negative for hearing loss.   Respiratory: Negative for shortness of breath.   Cardiovascular: Negative for chest pain.  Gastrointestinal: Positive for abdominal pain, diarrhea, nausea and vomiting. Negative for blood in stool.  Genitourinary: Negative for dysuria.  Musculoskeletal: Negative for myalgias.  Skin: Negative for rash.  Neurological: Negative for dizziness.  Psychiatric/Behavioral: Negative for depression.    Physical Exam BP (!) 174/64 (BP Location: Left Arm) Comment: MD aware, will evaluate with noon vitals- Coreg was reordered and given  Pulse 70   Temp 98.4 F (36.9 C) (Oral)   Resp 16   Ht 5\' 2"  (1.575 m)   Wt 49.9 kg   SpO2 98%   BMI 20.12 kg/m  CONSTITUTIONAL: No acute distress HEENT:  Normocephalic, atraumatic, extraocular motion intact. NECK: Trachea is midline, and there is no jugular venous distension. RESPIRATORY:  Lungs are clear, and breath sounds are equal bilaterally. Normal respiratory effort without pathologic use of accessory muscles. CARDIOVASCULAR: Heart is regular without murmurs, gallops, or rubs. GI: The abdomen is soft, nondistended, currently nontender to palpation. MUSCULOSKELETAL:  Normal muscle strength and tone in all four extremities.  No peripheral edema or cyanosis. SKIN: Skin turgor is normal. There are no pathologic skin lesions.  NEUROLOGIC:  Motor and sensation is grossly normal.  Cranial nerves are grossly intact. PSYCH:  Alert and oriented to person, place and time. Affect is normal.  Laboratory Analysis: Results for orders placed or performed during the hospital encounter of 07/13/20 (from the past 24 hour(s))  CBC with Differential     Status: Abnormal   Collection Time: 07/13/20  8:45 PM  Result Value Ref Range   WBC 10.0 4.0 - 10.5 K/uL   RBC 4.67 3.87 - 5.11 MIL/uL   Hemoglobin 15.0 12.0 - 15.0 g/dL   HCT  43.4 36.0 - 46.0 %   MCV 92.9 80.0 - 100.0 fL   MCH 32.1 26.0 - 34.0 pg   MCHC 34.6 30.0 - 36.0 g/dL   RDW 13.8 11.5 - 15.5 %   Platelets 103 (L) 150 - 400 K/uL   nRBC 0.0 0.0 - 0.2 %   Neutrophils Relative % 89 %   Neutro Abs 8.8 (H) 1.7 - 7.7 K/uL   Lymphocytes Relative 6 %   Lymphs Abs 0.6 (L)  0.7 - 4.0 K/uL   Monocytes Relative 4 %   Monocytes Absolute 0.4 0.1 - 1.0 K/uL   Eosinophils Relative 1 %   Eosinophils Absolute 0.1 0.0 - 0.5 K/uL   Basophils Relative 0 %   Basophils Absolute 0.0 0.0 - 0.1 K/uL   Immature Granulocytes 0 %   Abs Immature Granulocytes 0.04 0.00 - 0.07 K/uL  Comprehensive metabolic panel     Status: Abnormal   Collection Time: 07/13/20  8:45 PM  Result Value Ref Range   Sodium 139 135 - 145 mmol/L   Potassium 3.4 (L) 3.5 - 5.1 mmol/L   Chloride 104 98 - 111 mmol/L   CO2 26 22 - 32 mmol/L   Glucose, Bld 160 (H) 70 - 99 mg/dL   BUN 16 8 - 23 mg/dL   Creatinine, Ser 0.91 0.44 - 1.00 mg/dL   Calcium 9.7 8.9 - 10.3 mg/dL   Total Protein 6.5 6.5 - 8.1 g/dL   Albumin 3.9 3.5 - 5.0 g/dL   AST 37 15 - 41 U/L   ALT 18 0 - 44 U/L   Alkaline Phosphatase 63 38 - 126 U/L   Total Bilirubin 1.6 (H) 0.3 - 1.2 mg/dL   GFR, Estimated >60 >60 mL/min   Anion gap 9 5 - 15  Lipase, blood     Status: None   Collection Time: 07/13/20  8:45 PM  Result Value Ref Range   Lipase 38 11 - 51 U/L  Troponin I (High Sensitivity)     Status: None   Collection Time: 07/13/20  8:45 PM  Result Value Ref Range   Troponin I (High Sensitivity) 8 <18 ng/L  Urinalysis, Complete w Microscopic Urine, Random     Status: Abnormal   Collection Time: 07/13/20  8:57 PM  Result Value Ref Range   Color, Urine YELLOW (A) YELLOW   APPearance CLEAR (A) CLEAR   Specific Gravity, Urine 1.032 (H) 1.005 - 1.030   pH 6.0 5.0 - 8.0   Glucose, UA NEGATIVE NEGATIVE mg/dL   Hgb urine dipstick SMALL (A) NEGATIVE   Bilirubin Urine NEGATIVE NEGATIVE   Ketones, ur NEGATIVE NEGATIVE mg/dL   Protein, ur  NEGATIVE NEGATIVE mg/dL   Nitrite NEGATIVE NEGATIVE   Leukocytes,Ua NEGATIVE NEGATIVE   RBC / HPF 0-5 0 - 5 RBC/hpf   WBC, UA 0-5 0 - 5 WBC/hpf   Bacteria, UA NONE SEEN NONE SEEN   Squamous Epithelial / LPF 0-5 0 - 5  Troponin I (High Sensitivity)     Status: None   Collection Time: 07/13/20 11:41 PM  Result Value Ref Range   Troponin I (High Sensitivity) 8 <18 ng/L  Lactic acid, plasma     Status: Abnormal   Collection Time: 07/13/20 11:41 PM  Result Value Ref Range   Lactic Acid, Venous 2.1 (HH) 0.5 - 1.9 mmol/L  Gastrointestinal Panel by PCR , Stool     Status: None   Collection Time: 07/13/20 11:42 PM   Specimen: Stool  Result Value Ref Range   Campylobacter species NOT DETECTED NOT DETECTED   Plesimonas shigelloides NOT DETECTED NOT DETECTED   Salmonella species NOT DETECTED NOT DETECTED   Yersinia enterocolitica NOT DETECTED NOT DETECTED   Vibrio species NOT DETECTED NOT DETECTED   Vibrio cholerae NOT DETECTED NOT DETECTED   Enteroaggregative E coli (EAEC) NOT DETECTED NOT DETECTED   Enteropathogenic E coli (EPEC) NOT DETECTED NOT DETECTED   Enterotoxigenic E coli (ETEC) NOT DETECTED NOT DETECTED   Shiga  like toxin producing E coli (STEC) NOT DETECTED NOT DETECTED   Shigella/Enteroinvasive E coli (EIEC) NOT DETECTED NOT DETECTED   Cryptosporidium NOT DETECTED NOT DETECTED   Cyclospora cayetanensis NOT DETECTED NOT DETECTED   Entamoeba histolytica NOT DETECTED NOT DETECTED   Giardia lamblia NOT DETECTED NOT DETECTED   Adenovirus F40/41 NOT DETECTED NOT DETECTED   Astrovirus NOT DETECTED NOT DETECTED   Norovirus GI/GII NOT DETECTED NOT DETECTED   Rotavirus A NOT DETECTED NOT DETECTED   Sapovirus (I, II, IV, and V) NOT DETECTED NOT DETECTED  C Difficile Quick Screen w PCR reflex     Status: None   Collection Time: 07/13/20 11:42 PM   Specimen: Stool  Result Value Ref Range   C Diff antigen NEGATIVE NEGATIVE   C Diff toxin NEGATIVE NEGATIVE   C Diff interpretation No C.  difficile detected.   Resp Panel by RT-PCR (Flu A&B, Covid)     Status: None   Collection Time: 07/13/20 11:48 PM   Specimen: Nasopharyngeal(NP) swabs in vial transport medium  Result Value Ref Range   SARS Coronavirus 2 by RT PCR NEGATIVE NEGATIVE   Influenza A by PCR NEGATIVE NEGATIVE   Influenza B by PCR NEGATIVE NEGATIVE  Lactic acid, plasma     Status: Abnormal   Collection Time: 07/14/20  2:23 AM  Result Value Ref Range   Lactic Acid, Venous 2.2 (HH) 0.5 - 1.9 mmol/L  Basic metabolic panel     Status: Abnormal   Collection Time: 07/14/20  2:23 AM  Result Value Ref Range   Sodium 141 135 - 145 mmol/L   Potassium 4.8 3.5 - 5.1 mmol/L   Chloride 106 98 - 111 mmol/L   CO2 27 22 - 32 mmol/L   Glucose, Bld 137 (H) 70 - 99 mg/dL   BUN 16 8 - 23 mg/dL   Creatinine, Ser 0.82 0.44 - 1.00 mg/dL   Calcium 9.3 8.9 - 10.3 mg/dL   GFR, Estimated >60 >60 mL/min   Anion gap 8 5 - 15  CBC     Status: Abnormal   Collection Time: 07/14/20  2:23 AM  Result Value Ref Range   WBC 9.1 4.0 - 10.5 K/uL   RBC 4.34 3.87 - 5.11 MIL/uL   Hemoglobin 13.8 12.0 - 15.0 g/dL   HCT 40.3 36.0 - 46.0 %   MCV 92.9 80.0 - 100.0 fL   MCH 31.8 26.0 - 34.0 pg   MCHC 34.2 30.0 - 36.0 g/dL   RDW 13.6 11.5 - 15.5 %   Platelets 86 (L) 150 - 400 K/uL   nRBC 0.0 0.0 - 0.2 %  Lactic acid, plasma     Status: None   Collection Time: 07/14/20  8:30 AM  Result Value Ref Range   Lactic Acid, Venous 1.7 0.5 - 1.9 mmol/L    Imaging: CT Head Wo Contrast  Result Date: 07/13/2020 CLINICAL DATA:  Minor head trauma. EXAM: CT HEAD WITHOUT CONTRAST TECHNIQUE: Contiguous axial images were obtained from the base of the skull through the vertex without intravenous contrast. COMPARISON:  CT head 09/13/2015, MRI head 09/15/2015 FINDINGS: Brain: Patchy and confluent areas of decreased attenuation are noted throughout the deep and periventricular white matter of the cerebral hemispheres bilaterally, compatible with chronic  microvascular ischemic disease. Similar-appearing right temporal lobe encephalomalacia. No evidence of large-territorial acute infarction. No parenchymal hemorrhage. No mass lesion. No extra-axial collection. No mass effect or midline shift. No hydrocephalus. Basilar cisterns are patent. Vascular: No hyperdense vessel. Skull:  No acute fracture or focal lesion. Sinuses/Orbits: Paranasal sinuses and mastoid air cells are clear. The orbits are unremarkable. Other: None. IMPRESSION: No acute intracranial abnormality. Electronically Signed   By: Iven Finn M.D.   On: 07/13/2020 22:46   CT ABDOMEN PELVIS W CONTRAST  Result Date: 07/13/2020 CLINICAL DATA:  Abdominal distension. EXAM: CT ABDOMEN AND PELVIS WITH CONTRAST TECHNIQUE: Multidetector CT imaging of the abdomen and pelvis was performed using the standard protocol following bolus administration of intravenous contrast. CONTRAST:  62mL OMNIPAQUE IOHEXOL 300 MG/ML  SOLN COMPARISON:  None. FINDINGS: Lower chest: Linear atelectasis versus scarring within the left lower lobe. Coronary artery calcifications. Hepatobiliary: No focal liver abnormality. No gallstones, gallbladder wall thickening, or pericholecystic fluid. No biliary dilatation. Pancreas: No focal lesion. Normal pancreatic contour. No surrounding inflammatory changes. No main pancreatic ductal dilatation. Spleen: Borderline enlarged spleen measuring up to 13 cm. Couple of subcentimeter hypodensities too small to characterize (2:24, 27). Adrenals/Urinary Tract: No adrenal nodule bilaterally. Bilateral kidneys enhance symmetrically. Several subcentimeter hypodensities are too small to characterize. A 1.5 cm fluid density lesion within the right kidney likely represents a simple renal cyst. No hydronephrosis. No hydroureter. The urinary bladder is unremarkable. Stomach/Bowel: Stomach is within normal limits. No evidence of bowel wall thickening or dilatation. Fluid density within the lumen of the large  bowel. Mild large bowel wall thickening and haziness of the distal transverse colon and left colon. Diffuse colonic diverticulosis. The appendix is not definitely identified. Vascular/Lymphatic: Main portal, splenic, superior mesenteric veins are patent. No abdominal aorta or iliac aneurysm. Severe calcified and noncalcified atherosclerotic plaque of the aorta and its branches. No abdominal, pelvic, or inguinal lymphadenopathy. Reproductive: Not well visualized due to streak artifact originating from the right femoral surgical hardware. Uterus and bilateral adnexal regions are grossly unremarkable. Other: Trace free fluid within the pelvis. No intraperitoneal free gas. No organized fluid collection. Musculoskeletal: No abdominal wall hernia or abnormality. No suspicious lytic or blastic osseous lesions. No acute displaced fracture. Old healed right pelvic fracture. Multilevel degenerative changes of the spine in a patient with this extra scoliosis centered at the L2-L3 level. Total right hip arthroplasty partially visualized. IMPRESSION: 1. Colitis of the distal transverse colon and left colon. Differential diagnosis for etiology include ischemia, infection, inflammation. 2. Fast transition state. 3. Diffuse colonic diverticulosis with no acute diverticulitis. 4. Mild splenomegaly. 5.  Aortic Atherosclerosis (ICD10-I70.0). Electronically Signed   By: Iven Finn M.D.   On: 07/13/2020 23:07    Assessment and Plan: This is a 87 y.o. female with colitis of the distal transverse colon and descending colon.  - On personal review of the CT scan, the patient does have significant atherosclerosis of the aorta and vessels, but I do not see any blockages in the main mesenteric vessels, considering the limitations of the CT scan she had.  Her lactic acid has improved and currently she is pain-free.  This would be unlikely to be ischemic colitis in nature as this would potentially be getting worse instead.  I think  based on the patient's history with her pain and diarrhea and nausea and vomiting starting shortly after eating seafood at a restaurant, that this is most likely gastroenteritis in nature.  For now, I think is reasonable to advance the patient's diet as tolerated, continue IV fluids for hydration given her vomiting and diarrhea, and potentially discharge her home tomorrow. -If there is any worsening episodes with diet advancement, then potentially would consider obtaining a CTA of the abdomen  pelvis to further evaluate the flow of the mesenteric vessels.  If there is any concerns with these, then she would benefit from a vascular surgery consult for potential revascularization. - We will continue to follow along with you.  Face-to-face time spent with the patient and care providers was 80 minutes, with more than 50% of the time spent counseling, educating, and coordinating care of the patient.     Melvyn Neth, MD [facility] Pg:  [phone]

## 2020-07-14 NOTE — Plan of Care (Signed)
Patient is alert and oriented but very weak.  Will use bed pan until strength is regained.  Will continue to monitor.  Christene Slates

## 2020-07-14 NOTE — H&P (Signed)
History and Physical    Teresa Franklin:[id number] DOB: December 12, 1930 DOA: 07/13/2020  PCP: [name], MD   Patient coming from: Home  I have personally briefly reviewed patient's old medical records in [place]  Chief Complaint: Abdominal pain, vomiting, diarrhea  HPI: Teresa Franklin is a 87 y.o. female with medical history significant for CAD, HTN, anxiety, hypothyroidism and stroke presenting to the emergency room with sudden onset crampy abdominal pain, nonbloody nonbilious vomiting and an episode of diarrhea starting a few hours after eating a fish dinner at a restaurant.  Pain is crampy with no aggravating or alleviating factors, in the mid to lower abdomen, nonradiating and of severe intensity.  Had a single episode of watery stool but has a sensation to go to the bathroom.  Denies fever or chills.  No chest pain or shortness of breath ED course: On arrival, afebrile, BP 169/82, pulse 84, O2 sat 100% on room air blood work, CBC with normal WBC and platelets 103,000.  And CMP for the most part unremarkable except for potassium of 3.4.  WBC normal but lactic acid 2.1.  Lipase 38.  Troponin was 8/8. EKG, personally viewed and interpreted: Normal sinus rhythm at 84 with no acute ST-T wave changes Imaging CT abdomen and pelvis: Colitis of the distal transverse and left colon with differential including ischemia, infection and inflammation among other findings  The emergency room provider spoke with surgeon, Dr. Hampton Abbot who reviewed CT abdomen and had low suspicion for ischemic colitis recommended and admitting to hospitalist team, trending lactates.  Hospitalist consulted for admission.   Review of Systems: As per HPI otherwise all other systems on review of systems negative.    Past Medical History:  Diagnosis Date  . Anxiety   . AV block    a. 08/2015 in setting of NSTEMI and RPAV intervention-->required temp wire but not PPM.  . Back pain   . Chronic combined systolic  (congestive) and diastolic (congestive) heart failure ([place])    a. 08/2015  Echo: EF 55-60%, no rwma, mod MR, mildly dil LA/RA, mod TR, PASP 36mmHg; b. 05/2016 Echo: EF 30-35%, inflat/inf sev HK, Gr1 DD; c. 08/2017 Echo: EF 30-35%, mild LVH. Sev inf/infseptal HK. Gr1 DD. Mod MR. mildly dil LA. Mildly reduced RV fxn.  . Coronary artery disease    a. 08/2015 NSTEMI/PCI: LM nl, LAD 75p (med rx), 20 diffuse, 50d, LCX nl, OM1/2 nl, RCA 47m, RPAV 100 (2.25x12 Promus Premier DES) - complicated by 2:1 HB req temp wire post-PCI; b. 04/2016 MV: EF 34%, lateral HK/infarct. No ischemia.  . Diverticulitis   . Generalized anxiety disorder   . Hepatic steatosis   . HLD (hyperlipidemia)   . Hypertension   . Hypokalemia   . Hypomagnesemia   . Hypothyroidism   . Ischemic cardiomyopathy    a. 08/2015  Echo: EF 55-60%; b. 05/2016 Echo: EF 30-35%; c. 08/2017 Echo: EF 30-35%.  . OA (osteoarthritis)   . Osteopenia   . Sleep apnea   . Stroke [facility])    a. 08/2015 Right PCA territory infarct/temoral lobe cerebral infarction-->MRA showed severe flow liminting stenosis of R P2 cerebral artery;  b. 08/2015 Carotid U/S: 1-39% bilat ICA stenosis.  . Subdural hemorrhage ([place])    a. 05/2013 in setting of fall.  . Thrombocytopenia ([place])   . Weakness     Past Surgical History:  Procedure Laterality Date  . CARDIAC CATHETERIZATION N/A 09/08/2015   Procedure: Temporary Pacemaker;  Surgeon: Leonie Green  Ellyn Hack, MD;  Location: [place] [facility];  Service: Cardiovascular;  Laterality: N/A;  . CARDIAC CATHETERIZATION N/A 09/08/2015   Procedure: Left Heart Cath and Coronary Angiography;  Surgeon: Leonie Man, MD;  Location: [place] [facility];  Service: Cardiovascular;  Laterality: N/A;  . CARDIAC CATHETERIZATION N/A 09/08/2015   Procedure: Coronary Stent Intervention;  Surgeon: Leonie Man, MD;  Location: [place] [facility];  Service: Cardiovascular;  Laterality: N/A;  . CARDIAC CATHETERIZATION  09/08/2015   Procedure: Central Line  Insertion;  Surgeon: Leonie Man, MD;  Location: [place] [facility];  Service: Cardiovascular;;  . CARDIAC CATHETERIZATION  09/08/2015   Procedure: Arterial Line Insertion;  Surgeon: Leonie Man, MD;  Location: [place] [facility];  Service: Cardiovascular;;  . CARDIAC CATHETERIZATION N/A 09/09/2015   Procedure: Temporary Wire;  Surgeon: Lorretta Harp, MD;  Location: [place] [facility];  Service: Cardiovascular;  Laterality: N/A;  . ELBOW SURGERY    . LEFT HEART CATH AND CORONARY ANGIOGRAPHY N/A 09/22/2017   Procedure: LEFT HEART CATH AND CORONARY ANGIOGRAPHY;  Surgeon: [name], MD;  Location: [place] [facility];  Service: Cardiovascular;  Laterality: N/A;     reports that she has never smoked. She has never used smokeless tobacco. She reports that she does not drink alcohol and does not use drugs.  Allergies  Allergen Reactions  . Codeine Nausea And Vomiting  . Flomax [Tamsulosin] Other (See Comments)    Pt states that this medication gave her a kidney infection.    Lebron Quam [Hydrocodone-Acetaminophen] Nausea And Vomiting  . Sulfa Antibiotics Nausea And Vomiting    Family History  Problem Relation Age of Onset  . Hypertension Mother   . Hyperlipidemia Mother   . Stroke Mother   . Diabetes Sister   . Hypertension Sister   . Hyperlipidemia Sister   . Bipolar disorder Sister   . Hyperlipidemia Sister   . Stroke Sister   . Prostate cancer Brother   . Breast cancer Neg Hx       Prior to Admission medications   Medication Sig Start Date End Date Taking? Authorizing Provider  acetaminophen (TYLENOL) 325 MG tablet Take 650 mg by mouth every 6 (six) hours as needed for mild pain, fever or headache.     [provider]  ALPRAZolam Duanne Moron) 0.25 MG tablet Take 1 tablet (0.25 mg total) by mouth at bedtime as needed for anxiety. 07/10/16   Gladstone Lighter, MD  aspirin 81 MG chewable tablet Chew 1 tablet (81 mg total) by mouth daily. 09/13/15   Lyda Jester  M, PA-C  atorvastatin (LIPITOR) 20 MG tablet Take 1 tablet (20 mg total) by mouth daily. 09/30/15   [name], MD  calcium-vitamin D 250-100 MG-UNIT tablet Take 1 tablet by mouth 2 (two) times daily.    [provider]  carvedilol (COREG) 6.25 MG tablet TAKE (1) TABLET BY MOUTH TWICE DAILY 10/17/18   Dunn, Areta Haber, PA-C  Coenzyme Q10 (CO Q 10 PO) Take by mouth daily.    [provider]  folic acid-pyridoxine-cyancobalamin (FOLBIC) 2.5-25-2 MG TABS tablet Take 1 tablet by mouth daily.  Patient not taking: Reported on 06/04/2020 12/13/18   [provider]  hydroxypropyl methylcellulose / hypromellose (ISOPTO TEARS / GONIOVISC) 2.5 % ophthalmic solution Place 1 drop into both eyes 4 (four) times daily as needed for dry eyes.    [provider]  levothyroxine (SYNTHROID, LEVOTHROID) 25 MCG tablet Take 25 mcg  by mouth daily before breakfast.     [provider]  lisinopril (PRINIVIL,ZESTRIL) 40 MG tablet Take 20 mg by mouth daily.     [provider]  Multiple Vitamins-Minerals (MULTIVITAMIN GUMMIES ADULT) CHEW Chew 2 each by mouth at bedtime.    [provider]  Multiple Vitamins-Minerals (PRESERVISION AREDS 2) CAPS Take 1 capsule by mouth daily.    [provider]  omeprazole (PRILOSEC) 20 MG capsule Take 20 mg by mouth daily before breakfast.     [provider]  oxybutynin (DITROPAN) 5 MG tablet Take 1 tablet by mouth 2 (two) times daily. 05/23/19   [provider]  polyethylene glycol (MIRALAX / GLYCOLAX) packet Take 17 g by mouth daily as needed for mild constipation. 07/10/16   Gladstone Lighter, MD  Simethicone 80 MG TABS Take 80 mg by mouth daily as needed (gas).     [provider]  nitroGLYCERIN (NITROSTAT) 0.4 MG SL tablet Place 1 tablet (0.4 mg total) under the tongue every 5 (five) minutes x 3 doses as needed for chest pain. Patient not taking: Reported on 12/02/2018 09/13/15 01/09/19  Consuelo Pandy, PA-C    Physical Exam: Vitals:   07/13/20 2051 07/13/20 2100 07/13/20 2200 07/13/20 2350  BP: (!) 182/91 (!) 185/88 (!) 169/82 (!) 174/123  Pulse:  83 84 91  Resp: 16 18 14 18   Temp: 98.2 F (36.8 C)   98.6 F (37 C)  TempSrc: Oral   Oral  SpO2: 99% 100% 100% 100%  Weight: 49.9 kg     Height: 5\' 2"  (1.575 m)        Vitals:   07/13/20 2051 07/13/20 2100 07/13/20 2200 07/13/20 2350  BP: (!) 182/91 (!) 185/88 (!) 169/82 (!) 174/123  Pulse:  83 84 91  Resp: 16 18 14 18   Temp: 98.2 F (36.8 C)   98.6 F (37 C)  TempSrc: Oral   Oral  SpO2: 99% 100% 100% 100%  Weight: 49.9 kg     Height: 5\' 2"  (1.575 m)         Constitutional:  Frail-appearing elderly female.  Alert and oriented x 3 . Not in any apparent distress HEENT:      Head: Normocephalic and atraumatic.         Eyes: PERLA, EOMI, Conjunctivae are normal. Sclera is non-icteric.       Mouth/Throat: Mucous membranes are moist.       Neck: Supple with no signs of meningismus. Cardiovascular: Regular rate and rhythm. No murmurs, gallops, or rubs. 2+ symmetrical distal pulses are present . No JVD. No LE edema Respiratory: Respiratory effort normal .Lungs sounds clear bilaterally. No wheezes, crackles, or rhonchi.  Gastrointestinal: Soft, diffuse tenderness, and non distended with positive bowel sounds.  Genitourinary: No CVA tenderness. Musculoskeletal: Nontender with normal range of motion in all extremities. No cyanosis, or erythema of extremities. Neurologic:  Face is symmetric. Moving all extremities. No gross focal neurologic deficits . Skin: Skin is warm, dry.  No rash or ulcers Psychiatric: Mood and affect are normal    Labs on Admission: I have personally reviewed following labs and imaging studies  CBC: Recent Labs  Lab 07/13/20 2045  WBC 10.0  NEUTROABS 8.8*  HGB 15.0  HCT 43.4  MCV 92.9  PLT 782*   Basic Metabolic Panel: Recent Labs  Lab 07/13/20 2045  NA 139  K 3.4*  CL 104   CO2 26  GLUCOSE 160*  BUN 16  CREATININE 0.91  CALCIUM 9.7   GFR: Estimated Creatinine Clearance: 34.3 mL/min (by C-G formula based on SCr of 0.91 mg/dL). Liver Function Tests: Recent Labs  Lab 07/13/20 2045  AST 37  ALT 18  ALKPHOS 63  BILITOT 1.6*  PROT 6.5  ALBUMIN 3.9   Recent Labs  Lab 07/13/20 2045  LIPASE 38   No results for input(s): AMMONIA in the last 168 hours. Coagulation Profile: No results for input(s): INR, PROTIME in the last 168 hours. Cardiac Enzymes: No results for input(s): CKTOTAL, CKMB, CKMBINDEX, TROPONINI in the last 168 hours. BNP (last 3 results) No results for input(s): PROBNP in the last 8760 hours. HbA1C: No results for input(s): HGBA1C in the last 72 hours. CBG: No results for input(s): GLUCAP in the last 168 hours. Lipid Profile: No results for input(s): CHOL, HDL, LDLCALC, TRIG, CHOLHDL, LDLDIRECT in the last 72 hours. Thyroid Function Tests: No results for input(s): TSH, T4TOTAL, FREET4, T3FREE, THYROIDAB in the last 72 hours. Anemia Panel: No results for input(s): VITAMINB12, FOLATE, FERRITIN, TIBC, IRON, RETICCTPCT in the last 72 hours. Urine analysis:    Component Value Date/Time   COLORURINE YELLOW 12/06/2019 1431   APPEARANCEUR CLOUDY (A) 12/06/2019 1431   APPEARANCEUR Clear 03/26/2013 1808   LABSPEC 1.010 12/06/2019 1431   LABSPEC 1.015 03/26/2013 1808   PHURINE 5.5 12/06/2019 1431   GLUCOSEU NEGATIVE 12/06/2019 1431   GLUCOSEU Negative 03/26/2013 1808   HGBUR MODERATE (A) 12/06/2019 1431   BILIRUBINUR NEGATIVE 12/06/2019 1431   BILIRUBINUR Negative 03/26/2013 1808   KETONESUR NEGATIVE 12/06/2019 1431   PROTEINUR NEGATIVE 12/06/2019 1431   NITRITE POSITIVE (A) 12/06/2019 1431   LEUKOCYTESUR MODERATE (A) 12/06/2019 1431   LEUKOCYTESUR Negative 03/26/2013 1808    Radiological Exams on Admission: CT Head Wo Contrast  Result Date: 07/13/2020 CLINICAL DATA:  Minor head trauma. EXAM: CT HEAD WITHOUT CONTRAST  TECHNIQUE: Contiguous axial images were obtained from the base of the skull through the vertex without intravenous contrast. COMPARISON:  CT head 09/13/2015, MRI head 09/15/2015 FINDINGS: Brain: Patchy and confluent areas of decreased attenuation are noted throughout the deep and periventricular white matter of the cerebral hemispheres bilaterally, compatible with chronic microvascular ischemic disease. Similar-appearing right temporal lobe encephalomalacia. No evidence of large-territorial acute infarction. No parenchymal hemorrhage. No mass lesion. No extra-axial collection. No mass effect or midline shift. No hydrocephalus. Basilar cisterns are patent. Vascular: No hyperdense vessel. Skull: No acute fracture or focal lesion. Sinuses/Orbits: Paranasal sinuses and mastoid air cells are clear. The orbits are unremarkable. Other: None. IMPRESSION: No acute intracranial abnormality. Electronically Signed   By: Iven Finn M.D.   On: 07/13/2020 22:46   CT ABDOMEN PELVIS W CONTRAST  Result Date: 07/13/2020 CLINICAL DATA:  Abdominal distension. EXAM: CT ABDOMEN AND PELVIS WITH CONTRAST TECHNIQUE: Multidetector CT imaging of the abdomen and pelvis was performed using the standard protocol following bolus administration of intravenous contrast. CONTRAST:  23mL OMNIPAQUE IOHEXOL 300 MG/ML  SOLN COMPARISON:  None. FINDINGS: Lower chest: Linear atelectasis versus scarring within the left lower lobe. Coronary artery calcifications. Hepatobiliary: No focal liver abnormality. No gallstones, gallbladder wall thickening, or pericholecystic fluid. No biliary dilatation. Pancreas: No focal lesion. Normal pancreatic contour. No surrounding inflammatory changes. No main pancreatic ductal dilatation. Spleen: Borderline enlarged spleen measuring up to 13 cm. Couple of subcentimeter hypodensities too small to characterize (2:24, 27). Adrenals/Urinary Tract: No adrenal nodule bilaterally. Bilateral kidneys enhance symmetrically.  Several subcentimeter hypodensities are too small to characterize. A 1.5 cm fluid density lesion within the  right kidney likely represents a simple renal cyst. No hydronephrosis. No hydroureter. The urinary bladder is unremarkable. Stomach/Bowel: Stomach is within normal limits. No evidence of bowel wall thickening or dilatation. Fluid density within the lumen of the large bowel. Mild large bowel wall thickening and haziness of the distal transverse colon and left colon. Diffuse colonic diverticulosis. The appendix is not definitely identified. Vascular/Lymphatic: Main portal, splenic, superior mesenteric veins are patent. No abdominal aorta or iliac aneurysm. Severe calcified and noncalcified atherosclerotic plaque of the aorta and its branches. No abdominal, pelvic, or inguinal lymphadenopathy. Reproductive: Not well visualized due to streak artifact originating from the right femoral surgical hardware. Uterus and bilateral adnexal regions are grossly unremarkable. Other: Trace free fluid within the pelvis. No intraperitoneal free gas. No organized fluid collection. Musculoskeletal: No abdominal wall hernia or abnormality. No suspicious lytic or blastic osseous lesions. No acute displaced fracture. Old healed right pelvic fracture. Multilevel degenerative changes of the spine in a patient with this extra scoliosis centered at the L2-L3 level. Total right hip arthroplasty partially visualized. IMPRESSION: 1. Colitis of the distal transverse colon and left colon. Differential diagnosis for etiology include ischemia, infection, inflammation. 2. Fast transition state. 3. Diffuse colonic diverticulosis with no acute diverticulitis. 4. Mild splenomegaly. 5.  Aortic Atherosclerosis (ICD10-I70.0). Electronically Signed   By: Iven Finn M.D.   On: 07/13/2020 23:07     Assessment/Plan 87 year old female with history of CAD, HTN, anxiety, hypothyroidism and stroke presenting with abdominal pain, vomiting and  diarrhea starting 3 hours after a fish dinner at a restaurant    Acute colitis - Acute onset abdominal pain, diarrhea, vomiting, normal WBC but lactic acid 2.1 - CT abdomen and pelvis: Colitis of the distal transverse and left colon with differential including ischemia, infection and inflammation among other findings - Follow stool studies - Clear liquid diet as tolerated -The emergency room provider spoke with surgeon, Dr. Hampton Abbot who reviewed CT abdomen and had low suspicion for ischemic colitis recommended hydration and, trending lactates.    Essential hypertension - Continue home meds    Coronary artery disease involving native coronary artery of native heart with angina pectoris ([place]) - Continue home meds    Chronic ITP (idiopathic thrombocytopenic purpura) ([facility]) - Platelet count 103,000 - No evidence of bleeding    Acquired hypothyroidism - Continue levothyroxine    DVT prophylaxis: Lovenox  Code Status: full code  Family Communication:  none  Disposition Plan: Back to previous home environment Consults called: piscoya  Status:At the time of admission, it appears that the appropriate admission status for this patient is INPATIENT. This is judged to be reasonable and necessary in order to provide the required intensity of service to ensure the patient's safety given the presenting symptoms, physical exam findings, and initial radiographic and laboratory data in the context of their  Comorbid conditions.   Patient requires inpatient status due to high intensity of service, high risk for further deterioration and high frequency of surveillance required.   I certify that at the point of admission it is my clinical judgment that the patient will require inpatient hospital care spanning beyond [place] MD [facility]     07/14/2020, 1:09 AM

## 2020-07-14 NOTE — Progress Notes (Signed)
Patient's BP is 194/91. Got a verbal order from Dr. Damita Dunnings for one time dose of 5mg  labetalol.  Will recheck in an hour.  Christene Slates

## 2020-07-14 NOTE — Progress Notes (Signed)
177/72 map 100, pulse 62. A&O, abdominal pain is stable and intermittent at 2/10. MD notified no new orders.

## 2020-07-14 NOTE — Progress Notes (Signed)
Same day rounding progress note  Patient seen and examined.  Please see Dr. Josefine Class dictated history and physical for further details.  I agree with her management plans  Her symptoms are improving but lactate trending up from 1.7->3.2  Will monitor her overnight to make sure there is no clinical worsening.  Surgery is following and is in agreement with management plans.  Plans discussed with patient's son over phone who is in agreement and appreciative of her care.  He shares some concern for cardiac history 4 years ago with vague symptoms.  I have assured him and ordered few more sets of troponin although her initial troponins were negative.  Time spent: 20 minutes

## 2020-07-15 DIAGNOSIS — E872 Acidosis: Secondary | ICD-10-CM

## 2020-07-15 LAB — CBC
HCT: 34.1 % — ABNORMAL LOW (ref 36.0–46.0)
Hemoglobin: 11.6 g/dL — ABNORMAL LOW (ref 12.0–15.0)
MCH: 31.5 pg (ref 26.0–34.0)
MCHC: 34 g/dL (ref 30.0–36.0)
MCV: 92.7 fL (ref 80.0–100.0)
Platelets: 71 10*3/uL — ABNORMAL LOW (ref 150–400)
RBC: 3.68 MIL/uL — ABNORMAL LOW (ref 3.87–5.11)
RDW: 13.7 % (ref 11.5–15.5)
WBC: 4 10*3/uL (ref 4.0–10.5)
nRBC: 0 % (ref 0.0–0.2)

## 2020-07-15 LAB — LACTIC ACID, PLASMA: Lactic Acid, Venous: 1.1 mmol/L (ref 0.5–1.9)

## 2020-07-15 MED ORDER — HYDRALAZINE HCL 20 MG/ML IJ SOLN
10.0000 mg | Freq: Four times a day (QID) | INTRAMUSCULAR | Status: DC | PRN
  Administered 2020-07-15 – 2020-07-16 (×3): 10 mg via INTRAVENOUS
  Filled 2020-07-15 (×3): qty 1

## 2020-07-15 MED ORDER — ENSURE ENLIVE PO LIQD
237.0000 mL | Freq: Three times a day (TID) | ORAL | Status: DC
  Administered 2020-07-15 – 2020-07-16 (×2): 237 mL via ORAL

## 2020-07-15 MED ORDER — LEVOTHYROXINE SODIUM 50 MCG PO TABS
25.0000 ug | ORAL_TABLET | Freq: Every day | ORAL | Status: DC
  Administered 2020-07-16: 25 ug via ORAL
  Filled 2020-07-15: qty 1

## 2020-07-15 MED ORDER — ADULT MULTIVITAMIN W/MINERALS CH
1.0000 | ORAL_TABLET | Freq: Every day | ORAL | Status: DC
  Administered 2020-07-15 – 2020-07-16 (×2): 1 via ORAL
  Filled 2020-07-15 (×2): qty 1

## 2020-07-15 MED ORDER — LORATADINE 10 MG PO TABS
10.0000 mg | ORAL_TABLET | Freq: Every day | ORAL | Status: DC
  Administered 2020-07-15 – 2020-07-16 (×2): 10 mg via ORAL
  Filled 2020-07-15 (×2): qty 1

## 2020-07-15 NOTE — Progress Notes (Signed)
[facility] SURGICAL PROGRESS NOTE (cpt [phone])  Hospital Day(s): 1.   Interval History: Patient seen and examined, no acute events or new complaints overnight. Patient reports she still has abdomen soreness but overall improved. She denies fever, chills, nausea, emesis. She remains without leukocytosis with WBC of 4.0K this morning. Her lactic acidosis has resolved again, now 1.1. No new imaging this morning. She has been on a full liquid diet this morning and is tolerating this well.   Review of Systems:  Constitutional: denies fever, chills  HEENT: denies cough or congestion  Respiratory: denies any shortness of breath  Cardiovascular: denies chest pain or palpitations  Gastrointestinal: + abdominal pain (improved), denied N/V, or diarrhea/and bowel function as per interval history Genitourinary: denies burning with urination or urinary frequency   Vital signs in last 24 hours: [min-max] current  Temp:  [98 F (36.7 C)-98.9 F (37.2 C)] 98.2 F (36.8 C) (04/25 0346) Pulse Rate:  [59-70] 66 (04/25 0346) Resp:  [14-18] 18 (04/25 0346) BP: (144-198)/(57-93) 166/93 (04/25 0430) SpO2:  [98 %-100 %] 99 % (04/25 0346)     Height: 5\' 2"  (157.5 cm) Weight: 49.9 kg BMI (Calculated): 20.11   Intake/Output last 2 shifts:  04/24 0701 - 04/25 0700 In: 0.3 [I.V.:0.3] Out: 1600 [Urine:1600]   Physical Exam:  Constitutional: alert, cooperative and no distress  HENT: normocephalic without obvious abnormality  Eyes: PERRL, EOM's grossly intact and symmetric  Respiratory: breathing non-labored at rest  Cardiovascular: regular rate and sinus rhythm  Gastrointestinal: Soft, left sided abdominal soreness, and non-distended, no rebound/guarding Musculoskeletal: no edema or wounds, motor and sensation grossly intact, NT    Labs:  CBC Latest Ref Rng & Units 07/15/2020 07/14/2020 07/13/2020  WBC 4.0 - 10.5 K/uL 4.0 9.1 10.0  Hemoglobin 12.0 - 15.0 g/dL 11.6(L) 13.8 15.0  Hematocrit  36.0 - 46.0 % 34.1(L) 40.3 43.4  Platelets 150 - 400 K/uL 71(L) 86(L) 103(L)   CMP Latest Ref Rng & Units 07/14/2020 07/13/2020 06/04/2020  Glucose 70 - 99 mg/dL 137(H) 160(H) 101(H)  BUN 8 - 23 mg/dL 16 16 14   Creatinine 0.44 - 1.00 mg/dL 0.82 0.91 0.81  Sodium 135 - 145 mmol/L 141 139 143  Potassium 3.5 - 5.1 mmol/L 4.8 3.4(L) 4.3  Chloride 98 - 111 mmol/L 106 104 102  CO2 22 - 32 mmol/L 27 26 29   Calcium 8.9 - 10.3 mg/dL 9.3 9.7 10.2  Total Protein 6.5 - 8.1 g/dL - 6.5 7.2  Total Bilirubin 0.3 - 1.2 mg/dL - 1.6(H) 1.0  Alkaline Phos 38 - 126 U/L - 63 58  AST 15 - 41 U/L - 37 22  ALT 0 - 44 U/L - 18 13     Imaging studies: No new pertinent imaging studies   Assessment/Plan: (ICD-10's: K52.9) 87 y.o. female with improvement in abdominal pain and resolution in lactic acidosis admitted with colitis of the distal transverse colon and descending colon.   - Okay for trial of regular diet; order placed  - Wean from IVF support  - Given transient elevation in lactic acidosis, there may be some question of intermittent ischemia. No signs of ischemia or bowel compromise on examination. May benefit from vascular surgery consult, which could be outpatient, to evaluate this.   - Monitor abdominal examination; on-going bowel function  - Pain control prn; antiemetics prn   - Mobilization as tolerated  - No emergent surgical interventions at this time  - Further management per primary service   All of  the above findings and recommendations were discussed with the patient (attempted to call son x2 but no answer), and the medical team, and all of patient's questions were answered to her expressed satisfaction.  -- Edison Simon, PA-C [facility] 07/15/2020, 7:15 AM [phone] M-F: 7am - 4pm

## 2020-07-15 NOTE — Progress Notes (Addendum)
1        [facility] [place] NAME: Teresa Franklin    MR#:  [id number]  DATE OF BIRTH:  1930/10/27  SUBJECTIVE:  CHIEF COMPLAINT:   Chief Complaint  Patient presents with  . Abdominal Pain    Pt arrives from home via [place] --- cc: generalized abd pain which pt attributes to eating seafood approx 1400hrs -- also reports vomiting and dizziness.  4mg  IVP Zofran given by ems with 20G L FA.  BGL 155.  Hypertensive sbp initially 180 - most current sbp per ems 201.  Pt also points out bruising to underside R breast and lower R FA/hand secondary to mech fall one week pta.    Feeling more anxious today requesting to stay 1 more night.  Waiting to get her diet advanced as well.  Still has some loose stool but not watery and less frequency than before REVIEW OF SYSTEMS:  Review of Systems  Constitutional: Positive for malaise/fatigue. Negative for diaphoresis, fever and weight loss.  HENT: Negative for ear discharge, ear pain, hearing loss, nosebleeds, sore throat and tinnitus.   Eyes: Negative for blurred vision and pain.  Respiratory: Negative for cough, hemoptysis, shortness of breath and wheezing.   Cardiovascular: Negative for chest pain, palpitations, orthopnea and leg swelling.  Gastrointestinal: Positive for diarrhea and nausea. Negative for abdominal pain, blood in stool, constipation, heartburn and vomiting.  Genitourinary: Negative for dysuria, frequency and urgency.  Musculoskeletal: Negative for back pain and myalgias.  Skin: Negative for itching and rash.  Neurological: Negative for dizziness, tingling, tremors, focal weakness, seizures, weakness and headaches.  Psychiatric/Behavioral: Negative for depression. The patient is not nervous/anxious.    DRUG ALLERGIES:   Allergies  Allergen Reactions  . Codeine Nausea And Vomiting  . Flomax [Tamsulosin] Other (See Comments)    Pt states that this medication gave her a kidney infection.    Lebron Quam  [Hydrocodone-Acetaminophen] Nausea And Vomiting  . Sulfa Antibiotics Nausea And Vomiting  . Atorvastatin Other (See Comments)    Leg pain, leg weakness   VITALS:  Blood pressure (!) 148/78, pulse 77, temperature 99.1 F (37.3 C), temperature source Oral, resp. rate 18, height 5\' 2"  (1.575 m), weight 49.9 kg, SpO2 99 %. PHYSICAL EXAMINATION:  Physical Exam  87 year old female lying in the bed comfortably without any acute distress Eyes pupils equal round reactive to light and accommodation Lungs clear to auscultation bilaterally no wheezing rales rhonchi or crepitation Cardiovascular S1-S2 normal no murmur rales or gallop Abdomen soft benign Neurologically alert and oriented, nonfocal exam Skin no rash or lesion Psychiatry: Seems anxious LABORATORY PANEL:  Female CBC Recent Labs  Lab 07/15/20 0336  WBC 4.0  HGB 11.6*  HCT 34.1*  PLT 71*   ------------------------------------------------------------------------------------------------------------------ Chemistries  Recent Labs  Lab 07/13/20 2045 07/14/20 0223  NA 139 141  K 3.4* 4.8  CL 104 106  CO2 26 27  GLUCOSE 160* 137*  BUN 16 16  CREATININE 0.91 0.82  CALCIUM 9.7 9.3  AST 37  --   ALT 18  --   ALKPHOS 63  --   BILITOT 1.6*  --    RADIOLOGY:  No results found. ASSESSMENT AND PLAN:  87 year old female with history of CAD, HTN, anxiety, hypothyroidism and stroke presenting with abdominal pain, vomiting and diarrhea starting 3 hours after a fish dinner at a restaurant    Acute colitis - Seen on CT scan.  Case discussed with vascular surgery who recommends  outpatient clinic follow-up for evaluation of ischemia -She is tolerating liquids I have advance to soft diet today    Essential hypertension - Continue home meds    Coronary artery disease involving native coronary artery of native heart with angina pectoris ([place]) - Continue home meds    Chronic ITP (idiopathic thrombocytopenic purpura) (HCC) -  Platelet count 71 - No evidence of bleeding.  We will stop Lovenox    Acquired hypothyroidism - Continue levothyroxine   Body mass index is 20.12 kg/m.  Net IO Since Admission: -1,369.68 mL [07/15/20 1432]      Status is: Inpatient  Remains inpatient appropriate because:IV treatments appropriate due to intensity of illness or inability to take PO   Dispo: The patient is from: Home              Anticipated d/c is to: Home with home health              Patient currently is not medically stable to d/c.   Difficult to place patient No   DVT prophylaxis:        SCDs    Family Communication: Updated son on phone on 4/25   All the records are reviewed and case discussed with Care Management/Social Worker. Management plans discussed with the patient, family and they are in agreement.  CODE STATUS: Full Code Level of care: Med-Surg  TOTAL TIME TAKING CARE OF THIS PATIENT: 35 minutes.   More than 50% of the time was spent in counseling/coordination of care: YES  POSSIBLE D/C IN 1 DAYS, DEPENDING ON CLINICAL CONDITION.   Max Sane M.D on 07/15/2020 at 2:32 PM  [facility]   CC: Primary care physician; [name], MD  Note: This dictation was prepared with Dragon dictation along with smaller phrase technology. Any transcriptional errors that result from this process are unintentional.

## 2020-07-15 NOTE — Progress Notes (Signed)
Initial Nutrition Assessment  DOCUMENTATION CODES:   Not applicable  INTERVENTION:   -MVI with minerals daily -Ensure Enlive po BID, each supplement provides 350 kcal and 20 grams of protein  NUTRITION DIAGNOSIS:   Inadequate oral intake related to altered GI function as evidenced by per patient/family report,meal completion < 25%.  GOAL:   Patient will meet greater than or equal to 90% of their needs  MONITOR:   PO intake,Supplement acceptance,Diet advancement,Labs,Weight trends,Skin,I & O's  REASON FOR ASSESSMENT:   Malnutrition Screening Tool    ASSESSMENT:   87 year old female with history of CAD, HTN, anxiety, hypothyroidism and stroke presenting with abdominal pain, vomiting and diarrhea starting 3 hours after a fish dinner at a restaurant  Pt admitted with acute colitis.   Reviewed I/O's: -1.6 L x 24 hours and -1.8 L since admission  UOP: 1.6 L x 24 hours  Spoke with pt at bedside, who reports a decreased appetite secondary to grief of her husband's passing approximately 2 years ago. Per pt, she often consumes 2-3 meals per day (Breakfast: oatmeal or cereal; Lunch: mac and cheese OR sandwich; Dinner: meat, starch, and vegetable). Pt shares that she tolerated some solid food today and is happy about this, but afraid to eat large quantities. She ate "a few bites" of her lunch without difficulty.   Pt shares her UBW is around 120#. She estimates she has lost about 10 pounds within the past 2 years. She started drinking Boost 1-2 times per day in attempt to regain her lost weight.   Discussed importance of good meal and supplement intake to promote healing. Pt amenable to Ensure Enlive supplements. She suspects she will be discharge home tomorrow.   Labs reviewed.   NUTRITION - FOCUSED PHYSICAL EXAM:  Flowsheet Row Most Recent Value  Orbital Region Mild depletion  Upper Arm Region Mild depletion  Thoracic and Lumbar Region No depletion  Buccal Region No  depletion  Temple Region Mild depletion  Clavicle Bone Region No depletion  Clavicle and Acromion Bone Region No depletion  Scapular Bone Region No depletion  Dorsal Hand Mild depletion  Patellar Region No depletion  Anterior Thigh Region No depletion  Posterior Calf Region No depletion  Edema (RD Assessment) Mild  Hair Reviewed  Eyes Reviewed  Mouth Reviewed  Skin Reviewed  Nails Reviewed       Diet Order:   Diet Order            Diet regular Room service appropriate? Yes; Fluid consistency: Thin  Diet effective now                 EDUCATION NEEDS:   Education needs have been addressed  Skin:  Skin Assessment: Reviewed RN Assessment  Last BM:  07/14/20  Height:   Ht Readings from Last 1 Encounters:  07/13/20 _0  (1.575 m)    Weight:   Wt Readings from Last 1 Encounters:  07/13/20 49.9 kg    Ideal Body Weight:  50 kg  BMI:  Body mass index is 20.12 kg/m.  Estimated Nutritional Needs:   Kcal:  1300-1500  Protein:  55-70 grams  Fluid:  > 1.3 L    Loistine Chance, RD, LDN, [place] Registered Dietitian II Certified Diabetes Care and Education Specialist Please refer to [facility] for RD and/or RD on-call/weekend/after hours pager

## 2020-07-16 DIAGNOSIS — I10 Essential (primary) hypertension: Secondary | ICD-10-CM

## 2020-07-16 NOTE — Progress Notes (Addendum)
[facility] SURGICAL PROGRESS NOTE (cpt [phone])  Hospital Day(s): 2.   Interval History: Patient seen and examined, no acute events or new complaints overnight. Patient reports she is feeling much better and "more like herself." She denies abdominal pain, nausea, emesis. No new labs or imaging this morning. She was able to tolerate a regular diet without issues. She continues to have bowel function.   Review of Systems:  Constitutional: denies fever, chills  HEENT: denies cough or congestion  Respiratory: denies any shortness of breath  Cardiovascular: denies chest pain or palpitations  Gastrointestinal: denied abdominal pain, N/V, or diarrhea/and bowel function as per interval history Genitourinary: denies burning with urination or urinary frequency  Vital signs in last 24 hours: [min-max] current  Temp:  [98 F (36.7 C)-99.1 F (37.3 C)] 98.4 F (36.9 C) (04/26 0726) Pulse Rate:  [67-95] 95 (04/26 0726) Resp:  [18-20] 18 (04/26 0726) BP: (139-192)/(64-86) 150/84 (04/26 0726) SpO2:  [95 %-99 %] 97 % (04/26 0726)     Height: 5\' 2"  (157.5 cm) Weight: 49.9 kg BMI (Calculated): 20.11   Intake/Output last 2 shifts:  04/25 0701 - 04/26 0700 In: 480 [P.O.:480] Out: -    Physical Exam:  Constitutional: alert, cooperative and no distress  HENT: normocephalic without obvious abnormality  Eyes: PERRL, EOM's grossly intact and symmetric  Respiratory: breathing non-labored at rest  Cardiovascular: regular rate and sinus rhythm  Gastrointestinal: Soft, non-tender, and non-distended, no rebound/guarding Musculoskeletal: no edema or wounds, motor and sensation grossly intact, NT    Labs:  CBC Latest Ref Rng & Units 07/15/2020 07/14/2020 07/13/2020  WBC 4.0 - 10.5 K/uL 4.0 9.1 10.0  Hemoglobin 12.0 - 15.0 g/dL 11.6(L) 13.8 15.0  Hematocrit 36.0 - 46.0 % 34.1(L) 40.3 43.4  Platelets 150 - 400 K/uL 71(L) 86(L) 103(L)   CMP Latest Ref Rng & Units 07/14/2020 07/13/2020 06/04/2020   Glucose 70 - 99 mg/dL 137(H) 160(H) 101(H)  BUN 8 - 23 mg/dL 16 16 14   Creatinine 0.44 - 1.00 mg/dL 0.82 0.91 0.81  Sodium 135 - 145 mmol/L 141 139 143  Potassium 3.5 - 5.1 mmol/L 4.8 3.4(L) 4.3  Chloride 98 - 111 mmol/L 106 104 102  CO2 22 - 32 mmol/L 27 26 29   Calcium 8.9 - 10.3 mg/dL 9.3 9.7 10.2  Total Protein 6.5 - 8.1 g/dL - 6.5 7.2  Total Bilirubin 0.3 - 1.2 mg/dL - 1.6(H) 1.0  Alkaline Phos 38 - 126 U/L - 63 58  AST 15 - 41 U/L - 37 22  ALT 0 - 44 U/L - 18 13     Imaging studies: No new pertinent imaging studies   Assessment/Plan: (ICD-10's: K52.9) 87 y.o. female with improvement in abdominal pain and resolution in lactic acidosis admitted with colitis of the distal transverse colon and descending colon.   - Okay to continue regular diet  - Again, may need to consider outpatient vascular surgery evaluation for transient episodes of lactic acidosis and whether or not they feel this could represent transient ischemic episodes.   - Monitor abdominal examination; on-going bowel function             - Pain control prn; antiemetics prn              - Mobilization as tolerated             - No emergent surgical interventions at this time             - Further management per  primary service     - Okay for discharge from surgical perspective; She does NOT need surgical follow up    All of the above findings and recommendations were discussed with the patient, and the medical team, and all of patient's questions were answered to her expressed satisfaction.  -- Edison Simon, PA-C [facility] 07/16/2020, 7:43 AM [phone] M-F: 7am - 4pm

## 2020-07-16 NOTE — Discharge Summary (Signed)
[facility] [place] NAME: Teresa Franklin    MR#:  [id number]  DATE OF BIRTH:  05/22/1930  DATE OF ADMISSION:  07/13/2020   ADMITTING PHYSICIAN: Athena Masse, MD  DATE OF DISCHARGE: 07/16/2020 11:28 AM  PRIMARY CARE PHYSICIAN: [name], MD   ADMISSION DIAGNOSIS:  Colitis [K52.9] Acute colitis [K52.9] DISCHARGE DIAGNOSIS:  Principal Problem:   Acute colitis Active Problems:   Essential hypertension   Coronary artery disease involving native coronary artery of native heart with angina pectoris (HCC)   Chronic ITP (idiopathic thrombocytopenic purpura) (HCC)   Acquired hypothyroidism  SECONDARY DIAGNOSIS:   Past Medical History:  Diagnosis Date  . Anxiety   . AV block    a. 08/2015 in setting of NSTEMI and RPAV intervention-->required temp wire but not PPM.  . Back pain   . Chronic combined systolic (congestive) and diastolic (congestive) heart failure ([place])    a. 08/2015  Echo: EF 55-60%, no rwma, mod MR, mildly dil LA/RA, mod TR, PASP 53mmHg; b. 05/2016 Echo: EF 30-35%, inflat/inf sev HK, Gr1 DD; c. 08/2017 Echo: EF 30-35%, mild LVH. Sev inf/infseptal HK. Gr1 DD. Mod MR. mildly dil LA. Mildly reduced RV fxn.  . Coronary artery disease    a. 08/2015 NSTEMI/PCI: LM nl, LAD 75p (med rx), 20 diffuse, 50d, LCX nl, OM1/2 nl, RCA 72m, RPAV 100 (2.25x12 Promus Premier DES) - complicated by 2:1 HB req temp wire post-PCI; b. 04/2016 MV: EF 34%, lateral HK/infarct. No ischemia.  . Diverticulitis   . Generalized anxiety disorder   . Hepatic steatosis   . HLD (hyperlipidemia)   . Hypertension   . Hypokalemia   . Hypomagnesemia   . Hypothyroidism   . Ischemic cardiomyopathy    a. 08/2015  Echo: EF 55-60%; b. 05/2016 Echo: EF 30-35%; c. 08/2017 Echo: EF 30-35%.  . OA (osteoarthritis)   . Osteopenia   . Sleep apnea   . Stroke [facility])    a. 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 08/2015 Carotid U/S: 1-39% bilat ICA stenosis.  . Subdural hemorrhage ([place])    a. 05/2013 in setting of fall.  . Thrombocytopenia ([place])   . Weakness    HOSPITAL COURSE:  87 year old female with history ofCAD, HTN, anxiety, hypothyroidism and strokepresenting with abdominal pain,vomiting and diarrhea starting 3 hours after a fish dinner at a restaurant  Acute colitis - Seen on CT scan.  Case discussed with vascular surgery who recommends outpatient clinic follow-up for evaluation of ischemia -She is tolerating diet  Essential hypertension - Controlled on home meds  Coronary artery disease involving native coronary artery of native heart with angina pectoris (HCC) -Stable on home meds  Chronic ITP (idiopathic thrombocytopenic purpura) (HCC) -Platelet count remained stable  Acquired hypothyroidism -Continue levothyroxine   DISCHARGE CONDITIONS:  Stable CONSULTS OBTAINED:  Treatment Team:  Olean Ree, MD DRUG ALLERGIES:   Allergies  Allergen Reactions  . Codeine Nausea And Vomiting  . Flomax [Tamsulosin] Other (See Comments)    Pt states that this medication gave her a kidney infection.    Lebron Quam [Hydrocodone-Acetaminophen] Nausea And Vomiting  . Sulfa Antibiotics Nausea And Vomiting  . Atorvastatin Other (See Comments)    Leg pain, leg weakness   DISCHARGE MEDICATIONS:   Allergies as of 07/16/2020      Reactions   Codeine Nausea And Vomiting  Flomax [tamsulosin] Other (See Comments)   Pt states that this medication gave her a kidney infection.     Norco [hydrocodone-acetaminophen] Nausea And Vomiting   Sulfa Antibiotics Nausea And Vomiting   Atorvastatin Other (See Comments)   Leg pain, leg weakness      Medication List    STOP taking these medications   atorvastatin 20 MG tablet Commonly known as: LIPITOR   calcium-vitamin D 250-100 MG-UNIT tablet     TAKE these medications   acetaminophen 325 MG  tablet Commonly known as: TYLENOL Take 650 mg by mouth every 6 (six) hours as needed for mild pain, fever or headache.   ALPRAZolam 0.25 MG tablet Commonly known as: XANAX Take 1 tablet (0.25 mg total) by mouth at bedtime as needed for anxiety.   aspirin 81 MG chewable tablet Chew 1 tablet (81 mg total) by mouth daily.   carvedilol 6.25 MG tablet Commonly known as: COREG TAKE (1) TABLET BY MOUTH TWICE DAILY   CO Q 10 PO Take by mouth daily.   Folbic 2.5-25-2 MG Tabs tablet Generic drug: folic acid-pyridoxine-cyancobalamin Take 1 tablet by mouth daily.   hydroxypropyl methylcellulose / hypromellose 2.5 % ophthalmic solution Commonly known as: ISOPTO TEARS / GONIOVISC Place 1 drop into both eyes 4 (four) times daily as needed for dry eyes.   levothyroxine 25 MCG tablet Commonly known as: SYNTHROID Take 25 mcg by mouth daily before breakfast.   lisinopril 40 MG tablet Commonly known as: ZESTRIL Take 20 mg by mouth daily.   Multivitamin Gummies Adult Chew Chew 2 each by mouth at bedtime.   PreserVision AREDS 2 Caps Take 1 capsule by mouth daily.   omeprazole 20 MG capsule Commonly known as: PRILOSEC Take 20 mg by mouth daily before breakfast.   oxybutynin 5 MG tablet Commonly known as: DITROPAN Take 1 tablet by mouth 2 (two) times daily.   polyethylene glycol 17 g packet Commonly known as: MIRALAX / GLYCOLAX Take 17 g by mouth daily as needed for mild constipation.   Simethicone 80 MG Tabs Take 80 mg by mouth daily as needed (gas).      DISCHARGE INSTRUCTIONS:   DIET:  Regular diet DISCHARGE CONDITION:  Good ACTIVITY:  Activity as tolerated OXYGEN:  Home Oxygen: No.  Oxygen Delivery: room air DISCHARGE LOCATION:  home   If you experience worsening of your admission symptoms, develop shortness of breath, life threatening emergency, suicidal or homicidal thoughts you must seek medical attention immediately by calling 911 or calling your MD immediately   if symptoms less severe.  You Must read complete instructions/literature along with all the possible adverse reactions/side effects for all the Medicines you take and that have been prescribed to you. Take any new Medicines after you have completely understood and accpet all the possible adverse reactions/side effects.   Please note  You were cared for by a hospitalist during your hospital stay. If you have any questions about your discharge medications or the care you received while you were in the hospital after you are discharged, you can call the unit and asked to speak with the hospitalist on call if the hospitalist that took care of you is not available. Once you are discharged, your primary care physician will handle any further medical issues. Please note that NO REFILLS for any discharge medications will be authorized once you are discharged, as it is imperative that you return to your primary care physician (or establish a relationship with a primary care physician if  you do not have one) for your aftercare needs so that they can reassess your need for medications and monitor your lab values.    On the day of Discharge:  VITAL SIGNS:  Blood pressure (!) 150/84, pulse 95, temperature 98.4 F (36.9 C), temperature source Oral, resp. rate 18, height 5\' 2"  (1.575 m), weight 49.9 kg, SpO2 97 %. PHYSICAL EXAMINATION:  GENERAL:  87 y.o.-year-old patient lying in the bed with no acute distress.  EYES: Pupils equal, round, reactive to light and accommodation. No scleral icterus. Extraocular muscles intact.  HEENT: Head atraumatic, normocephalic. Oropharynx and nasopharynx clear.  NECK:  Supple, no jugular venous distention. No thyroid enlargement, no tenderness.  LUNGS: Normal breath sounds bilaterally, no wheezing, rales,rhonchi or crepitation. No use of accessory muscles of respiration.  CARDIOVASCULAR: S1, S2 normal. No murmurs, rubs, or gallops.  ABDOMEN: Soft, non-tender, non-distended.  Bowel sounds present. No organomegaly or mass.  EXTREMITIES: No pedal edema, cyanosis, or clubbing.  NEUROLOGIC: Cranial nerves II through XII are intact. Muscle strength 5/5 in all extremities. Sensation intact. Gait not checked.  PSYCHIATRIC: The patient is alert and oriented x 3.  SKIN: No obvious rash, lesion, or ulcer.  DATA REVIEW:   CBC Recent Labs  Lab 07/15/20 0336  WBC 4.0  HGB 11.6*  HCT 34.1*  PLT 71*    Chemistries  Recent Labs  Lab 07/13/20 2045 07/14/20 0223  NA 139 141  K 3.4* 4.8  CL 104 106  CO2 26 27  GLUCOSE 160* 137*  BUN 16 16  CREATININE 0.91 0.82  CALCIUM 9.7 9.3  AST 37  --   ALT 18  --   ALKPHOS 63  --   BILITOT 1.6*  --      Outpatient follow-up  Follow-up Information    [name], MD. Go on 07/23/2020.   Specialty: Family Medicine Why: 1pm appointment Contact information: [place] [address] [phone]        [name], MD. Go on 07/24/2020.   Specialty: Cardiology Why: 9am appointment  Contact information: [address] [place] [address] [phone]        Delana Meyer, Dolores Lory, MD. Go on 08/05/2020.   Specialties: Vascular Surgery, Cardiology, Radiology, Vascular Surgery Why: 2:30 Contact information: [place] [address] [phone]        Lin Landsman, MD. Go on 07/30/2020.   Specialty: Gastroenterology Why: they will call the patient Contact information: [place] [address] [phone]               30 Day Unplanned Readmission Risk Score   Flowsheet Row ED to Hosp-Admission (Discharged) from 07/13/2020 in [facility]  30 Day Unplanned Readmission Risk Score (%) 12.32 Filed at 07/16/2020 0801     This score is the patient's risk of an unplanned readmission within 30 days of being discharged (0 -100%). The score is based on dignosis, age, lab data, medications, orders,  and past utilization.   Low:  0-14.9   Medium: 15-21.9   High: 22-29.9   Extreme: 30 and above         Management plans discussed with the patient, family and they are in agreement.  CODE STATUS: Prior   TOTAL TIME TAKING CARE OF THIS PATIENT: 45 minutes.    Max Sane M.D on 07/16/2020 at 5:16 PM  [facility]   CC: Primary care physician; [name], MD  Note: This dictation was prepared with Dragon dictation along with smaller phrase technology. Any transcriptional errors that result from this process are unintentional.

## 2020-07-16 NOTE — Plan of Care (Signed)
Patient discharged to home. Patient states ready to go- has had normal bowel movement and is ambulating in room without assistance. Reviewed discharge instructions as well as appointments, medication changes, and instructions with patient who verbalized understanding.  Awaiting family to transport.

## 2020-07-16 NOTE — Discharge Instructions (Signed)
Colitis  Colitis is a condition in which the colon is inflamed. It can cause diarrhea, blood in the stool, and abdominal pain. Colitis can last a short time (be acute), or it may last a long time (become chronic). What are the causes? This condition may be caused by:  Infections from viruses or bacteria.  A reaction to medicine.  Certain autoimmune diseases, such as Crohn's disease or ulcerative colitis.  Radiation treatment.  Decreased blood flow to the bowel (ischemia). What are the signs or symptoms? Symptoms of this condition include:  Diarrhea, blood in the stool, or black, tarry stool.  Pain in the joints or abdominal pain.  Fever or fatigue.  Vomiting.  Weight loss.  Bloating.  Having fewer bowel movements than usual.  A strong and sudden urge to have a bowel movement.  Feeling like the bowel is not empty after a bowel movement. How is this diagnosed? This condition may be diagnosed based on a stool test and a blood test. You may also have other tests, such as:  X-rays.  CT scan.  Colonoscopy.  Endoscopy.  Biopsy. How is this treated? Treatment for this condition depends on the cause. This condition may be treated with:  Steps to rest the bowel, such as not eating or drinking for a period of time.  Fluids that are given through an IV.  Medicine for pain and diarrhea.  Antibiotic medicines.  Cortisone medicines.  Surgery. Follow these instructions at home: Eating and drinking  Follow instructions from your health care provider about eating or drinking restrictions.  Drink enough fluid to keep your urine pale yellow.  Work with a dietitian to determine whether certain foods cause your condition to flare up.  Avoid foods or drinks that cause flare-ups.  Eat a well-balanced diet.   General instructions  If you were prescribed an antibiotic medicine, take it as told by your health care provider. Do not stop taking the antibiotic even if you  start to feel better.  Take over-the-counter and prescription medicines only as told by your health care provider.  Keep all follow-up visits. This is important. Contact a health care provider if:  Your symptoms do not go away.  You develop new symptoms. Get help right away if:  You have a fever that does not go away with treatment.  You develop chills.  You have extreme weakness, fainting, or dehydration.  You vomit repeatedly.  You develop severe pain in your abdomen.  You pass bloody or tarry stool. Summary  Colitis is a condition in which the colon is inflamed. Colitis can last a short time (be acute), or it may last a long time (become chronic).  Treatment for this condition depends on the cause and may include resting the bowel, taking medicines, or having surgery.  If you were prescribed an antibiotic medicine, take it as told by your health care provider. Do not stop taking the antibiotic even if you start to feel better.  Get help right away if you develop severe pain in your abdomen.  Keep all follow-up visits. This is important. This information is not intended to replace advice given to you by your health care provider. Make sure you discuss any questions you have with your health care provider. Document Revised: 11/14/2019 Document Reviewed: 11/14/2019 Elsevier Patient Education  2021 Elsevier Inc.  

## 2020-07-17 ENCOUNTER — Observation Stay
Admit: 2020-07-17 | Discharge: 2020-07-17 | Disposition: A | Discharge status: Home / Self Care | Payer: Medicare PPO | Attending: Internal Medicine | Admitting: Internal Medicine

## 2020-07-17 ENCOUNTER — Observation Stay
Admission: EM | Admit: 2020-07-17 | Discharge: 2020-07-20 | Disposition: A | Discharge status: Home / Self Care | Payer: Medicare PPO | Attending: Internal Medicine | Admitting: Internal Medicine

## 2020-07-17 ENCOUNTER — Encounter: Payer: Self-pay | Admitting: Emergency Medicine

## 2020-07-17 ENCOUNTER — Other Ambulatory Visit: Payer: Self-pay

## 2020-07-17 ENCOUNTER — Emergency Department: Payer: Medicare PPO

## 2020-07-17 DIAGNOSIS — K5730 Diverticulosis of large intestine without perforation or abscess without bleeding: Secondary | ICD-10-CM | POA: Diagnosis not present

## 2020-07-17 DIAGNOSIS — Z79899 Other long term (current) drug therapy: Secondary | ICD-10-CM | POA: Diagnosis not present

## 2020-07-17 DIAGNOSIS — I110 Hypertensive heart disease with heart failure: Secondary | ICD-10-CM | POA: Insufficient documentation

## 2020-07-17 DIAGNOSIS — D123 Benign neoplasm of transverse colon: Principal | ICD-10-CM | POA: Insufficient documentation

## 2020-07-17 DIAGNOSIS — R778 Other specified abnormalities of plasma proteins: Secondary | ICD-10-CM

## 2020-07-17 DIAGNOSIS — Z7982 Long term (current) use of aspirin: Secondary | ICD-10-CM | POA: Insufficient documentation

## 2020-07-17 DIAGNOSIS — I2510 Atherosclerotic heart disease of native coronary artery without angina pectoris: Secondary | ICD-10-CM | POA: Diagnosis not present

## 2020-07-17 DIAGNOSIS — K529 Noninfective gastroenteritis and colitis, unspecified: Secondary | ICD-10-CM

## 2020-07-17 DIAGNOSIS — D124 Benign neoplasm of descending colon: Secondary | ICD-10-CM | POA: Insufficient documentation

## 2020-07-17 DIAGNOSIS — Z20822 Contact with and (suspected) exposure to covid-19: Secondary | ICD-10-CM | POA: Insufficient documentation

## 2020-07-17 DIAGNOSIS — I5042 Chronic combined systolic (congestive) and diastolic (congestive) heart failure: Secondary | ICD-10-CM | POA: Insufficient documentation

## 2020-07-17 DIAGNOSIS — E039 Hypothyroidism, unspecified: Secondary | ICD-10-CM | POA: Diagnosis not present

## 2020-07-17 DIAGNOSIS — R2689 Other abnormalities of gait and mobility: Secondary | ICD-10-CM | POA: Insufficient documentation

## 2020-07-17 DIAGNOSIS — R109 Unspecified abdominal pain: Secondary | ICD-10-CM | POA: Diagnosis present

## 2020-07-17 DIAGNOSIS — I5022 Chronic systolic (congestive) heart failure: Secondary | ICD-10-CM

## 2020-07-17 HISTORY — DX: Noninfective gastroenteritis and colitis, unspecified: K52.9

## 2020-07-17 LAB — URINALYSIS, COMPLETE (UACMP) WITH MICROSCOPIC
Bilirubin Urine: NEGATIVE
Glucose, UA: NEGATIVE mg/dL
Ketones, ur: NEGATIVE mg/dL
Leukocytes,Ua: NEGATIVE
Nitrite: NEGATIVE
Protein, ur: NEGATIVE mg/dL
Specific Gravity, Urine: 1.011 (ref 1.005–1.030)
pH: 6 (ref 5.0–8.0)

## 2020-07-17 LAB — CBC WITH DIFFERENTIAL/PLATELET
Abs Immature Granulocytes: 0.02 10*3/uL (ref 0.00–0.07)
Basophils Absolute: 0 10*3/uL (ref 0.0–0.1)
Basophils Relative: 0 %
Eosinophils Absolute: 0.1 10*3/uL (ref 0.0–0.5)
Eosinophils Relative: 2 %
HCT: 41 % (ref 36.0–46.0)
Hemoglobin: 14.2 g/dL (ref 12.0–15.0)
Immature Granulocytes: 0 %
Lymphocytes Relative: 18 %
Lymphs Abs: 0.9 10*3/uL (ref 0.7–4.0)
MCH: 32.1 pg (ref 26.0–34.0)
MCHC: 34.6 g/dL (ref 30.0–36.0)
MCV: 92.8 fL (ref 80.0–100.0)
Monocytes Absolute: 0.3 10*3/uL (ref 0.1–1.0)
Monocytes Relative: 6 %
Neutro Abs: 3.6 10*3/uL (ref 1.7–7.7)
Neutrophils Relative %: 74 %
Platelets: 100 10*3/uL — ABNORMAL LOW (ref 150–400)
RBC: 4.42 MIL/uL (ref 3.87–5.11)
RDW: 13.9 % (ref 11.5–15.5)
WBC: 5 10*3/uL (ref 4.0–10.5)
nRBC: 0 % (ref 0.0–0.2)

## 2020-07-17 LAB — CREATININE, SERUM
Creatinine, Ser: 0.74 mg/dL (ref 0.44–1.00)
GFR, Estimated: >60 mL/min (ref >60)

## 2020-07-17 LAB — TROPONIN I (HIGH SENSITIVITY)
Troponin I (High Sensitivity): 33 ng/L — ABNORMAL HIGH (ref <18)
Troponin I (High Sensitivity): 41 ng/L — ABNORMAL HIGH (ref <18)
Troponin I (High Sensitivity): 36 ng/L — ABNORMAL HIGH (ref <18)
Troponin I (High Sensitivity): 35 ng/L — ABNORMAL HIGH (ref <18)

## 2020-07-17 LAB — COMPREHENSIVE METABOLIC PANEL
ALT: 14 U/L (ref 0–44)
AST: 21 U/L (ref 15–41)
Albumin: 3.9 g/dL (ref 3.5–5.0)
Alkaline Phosphatase: 66 U/L (ref 38–126)
Anion gap: 8 (ref 5–15)
BUN: 21 mg/dL (ref 8–23)
CO2: 28 mmol/L (ref 22–32)
Calcium: 10.2 mg/dL (ref 8.9–10.3)
Chloride: 103 mmol/L (ref 98–111)
Creatinine, Ser: 0.87 mg/dL (ref 0.44–1.00)
GFR, Estimated: >60 mL/min (ref >60)
Glucose, Bld: 113 mg/dL — ABNORMAL HIGH (ref 70–99)
Potassium: 3.6 mmol/L (ref 3.5–5.1)
Sodium: 139 mmol/L (ref 135–145)
Total Bilirubin: 0.9 mg/dL (ref 0.3–1.2)
Total Protein: 6.5 g/dL (ref 6.5–8.1)

## 2020-07-17 LAB — LIPASE, BLOOD: Lipase: 37 U/L (ref 11–51)

## 2020-07-17 LAB — LACTIC ACID, PLASMA
Lactic Acid, Venous: 1.2 mmol/L (ref 0.5–1.9)
Lactic Acid, Venous: 1 mmol/L (ref 0.5–1.9)

## 2020-07-17 LAB — RESP PANEL BY RT-PCR (FLU A&B, COVID) ARPGX2
Influenza A by PCR: NEGATIVE
Influenza B by PCR: NEGATIVE
SARS Coronavirus 2 by RT PCR: NEGATIVE

## 2020-07-17 MED ORDER — POLYETHYLENE GLYCOL 3350 17 G PO PACK: 17.0000 g | PACK | Freq: Every day | ORAL | Status: DC | PRN

## 2020-07-17 MED ORDER — HYPROMELLOSE (GONIOSCOPIC) 2.5 % OP SOLN: 1.0000 [drp] | Freq: Four times a day (QID) | OPHTHALMIC | Status: DC | PRN

## 2020-07-17 MED ORDER — POLYVINYL ALCOHOL 1.4 % OP SOLN
1.0000 [drp] | OPHTHALMIC | Status: DC | PRN
  Administered 2020-07-18 – 2020-07-19 (×2): 1 [drp] via OPHTHALMIC
  Filled 2020-07-17 (×2): qty 15

## 2020-07-17 MED ORDER — ONDANSETRON HCL 4 MG/2ML IJ SOLN
4.0000 mg | Freq: Four times a day (QID) | INTRAMUSCULAR | Status: DC | PRN
  Administered 2020-07-19: 4 mg via INTRAVENOUS
  Filled 2020-07-17: qty 2

## 2020-07-17 MED ORDER — ENOXAPARIN SODIUM 40 MG/0.4ML ~~LOC~~ SOLN
40.0000 mg | SUBCUTANEOUS | Status: DC
  Administered 2020-07-17 – 2020-07-19 (×3): 40 mg via SUBCUTANEOUS
  Filled 2020-07-17 (×3): qty 0.4

## 2020-07-17 MED ORDER — SODIUM CHLORIDE 0.9 % IV SOLN
3.0000 g | Freq: Four times a day (QID) | INTRAVENOUS | Status: DC
  Administered 2020-07-17 – 2020-07-19 (×7): 3 g via INTRAVENOUS
  Filled 2020-07-17 (×4): qty 3
  Filled 2020-07-17 (×2): qty 8
  Filled 2020-07-17 (×3): qty 3
  Filled 2020-07-17: qty 8

## 2020-07-17 MED ORDER — LEVOTHYROXINE SODIUM 50 MCG PO TABS
25.0000 ug | ORAL_TABLET | Freq: Every day | ORAL | Status: DC
  Administered 2020-07-18 – 2020-07-20 (×3): 25 ug via ORAL
  Filled 2020-07-17 (×3): qty 1

## 2020-07-17 MED ORDER — LISINOPRIL 20 MG PO TABS
20.0000 mg | ORAL_TABLET | Freq: Every day | ORAL | Status: DC
  Administered 2020-07-17 – 2020-07-20 (×4): 20 mg via ORAL
  Filled 2020-07-17 (×4): qty 1

## 2020-07-17 MED ORDER — SODIUM CHLORIDE 0.9 % IV SOLN
3.0000 g | Freq: Once | INTRAVENOUS | Status: AC
  Administered 2020-07-17: 3 g via INTRAVENOUS
  Filled 2020-07-17: qty 8

## 2020-07-17 MED ORDER — MELATONIN 5 MG PO TABS: 5.0000 mg | ORAL_TABLET | Freq: Every evening | ORAL | Status: DC | PRN

## 2020-07-17 MED ORDER — PANTOPRAZOLE SODIUM 40 MG PO TBEC
40.0000 mg | DELAYED_RELEASE_TABLET | Freq: Every day | ORAL | Status: DC
  Administered 2020-07-18 – 2020-07-20 (×3): 40 mg via ORAL
  Filled 2020-07-17 (×3): qty 1

## 2020-07-17 MED ORDER — FA-PYRIDOXINE-CYANOCOBALAMIN 2.5-25-2 MG PO TABS: 1.0000 | ORAL_TABLET | Freq: Every day | ORAL | Status: DC

## 2020-07-17 MED ORDER — ADULT MULTIVITAMIN W/MINERALS CH
1.0000 | ORAL_TABLET | Freq: Every day | ORAL | Status: DC
  Administered 2020-07-19: 1 via ORAL
  Filled 2020-07-17 (×3): qty 1

## 2020-07-17 MED ORDER — TRAMADOL HCL 50 MG PO TABS: 50.0000 mg | ORAL_TABLET | Freq: Four times a day (QID) | ORAL | Status: DC | PRN

## 2020-07-17 MED ORDER — MULTIVITAMIN GUMMIES ADULT PO CHEW: 2.0000 | CHEWABLE_TABLET | Freq: Every day | ORAL | Status: DC

## 2020-07-17 MED ORDER — ACETAMINOPHEN 325 MG PO TABS: 650.0000 mg | ORAL_TABLET | Freq: Four times a day (QID) | ORAL | Status: DC | PRN

## 2020-07-17 MED ORDER — IOHEXOL 300 MG/ML  SOLN
75.0000 mL | Freq: Once | INTRAMUSCULAR | Status: AC | PRN
  Administered 2020-07-17: 75 mL via INTRAVENOUS

## 2020-07-17 MED ORDER — CARVEDILOL 6.25 MG PO TABS
3.1250 mg | ORAL_TABLET | Freq: Two times a day (BID) | ORAL | Status: DC
  Administered 2020-07-17 – 2020-07-20 (×6): 3.125 mg via ORAL
  Filled 2020-07-17 (×6): qty 1

## 2020-07-17 MED ORDER — OXYBUTYNIN CHLORIDE 5 MG PO TABS
5.0000 mg | ORAL_TABLET | Freq: Two times a day (BID) | ORAL | Status: DC
  Administered 2020-07-17 – 2020-07-20 (×6): 5 mg via ORAL
  Filled 2020-07-17 (×7): qty 1

## 2020-07-17 MED ORDER — HYDRALAZINE HCL 20 MG/ML IJ SOLN
5.0000 mg | Freq: Four times a day (QID) | INTRAMUSCULAR | Status: DC | PRN
  Administered 2020-07-18: 5 mg via INTRAVENOUS
  Filled 2020-07-17: qty 1

## 2020-07-17 MED ORDER — ASPIRIN 81 MG PO CHEW
81.0000 mg | CHEWABLE_TABLET | Freq: Every day | ORAL | Status: DC
  Administered 2020-07-18 – 2020-07-20 (×3): 81 mg via ORAL
  Filled 2020-07-17 (×3): qty 1

## 2020-07-17 MED ORDER — ALPRAZOLAM 0.5 MG PO TABS
0.2500 mg | ORAL_TABLET | Freq: Every evening | ORAL | Status: DC | PRN
  Administered 2020-07-19: 0.25 mg via ORAL
  Filled 2020-07-17: qty 1

## 2020-07-17 MED ORDER — SIMETHICONE 80 MG PO CHEW
80.0000 mg | CHEWABLE_TABLET | Freq: Every day | ORAL | Status: DC | PRN
  Filled 2020-07-17: qty 1

## 2020-07-17 MED ORDER — B COMPLEX-C PO TABS
1.0000 | ORAL_TABLET | Freq: Every day | ORAL | Status: DC
  Administered 2020-07-18 – 2020-07-20 (×3): 1 via ORAL
  Filled 2020-07-17 (×3): qty 1

## 2020-07-17 NOTE — H&P (Signed)
History and Physical  Teresa Franklin [id number] DOB: November 23, 1930 DOA: 07/17/2020  Referring physician: Dr. Charna Archer, EDP PCP: [name], MD  Outpatient Specialists: Cardiology, oncology. Patient coming from: Home.  Chief Complaint: Abdominal pain.  HPI: Teresa Franklin is a 87 y.o. female with medical history significant for coronary artery disease, hypertension, chronic anxiety, hypothyroidism, CVA, presents with recurrent lower abdominal pain.  She was discharged from the hospital the day prior to presentation for the same.  States her pain is periumbilical.  Denies vomiting or diarrhea.  Repeated CT abdomen and pelvis with contrast done in the ED revealed progressive pancolonic wall thickening most pronounced within the transverse colon, consistent with a nonspecific infectious or inflammatory colitis.  EDP started Unasyn and requested admission.  ED Course:  Afebrile.  BP 187/90, pulse 76, respiratory rate 18, O2 saturation 99% room air.  Lab studies essentially unremarkable except for elevated troponin 33.  Twelve-lead EKG normal sinus rhythm with no evidence of acute ischemia.  Review of Systems: Review of systems as noted in the HPI. All other systems reviewed and are negative.   Past Medical History:  Diagnosis Date  . Anxiety   . AV block    a. 08/2015 in setting of NSTEMI and RPAV intervention-->required temp wire but not PPM.  . Back pain   . Chronic combined systolic (congestive) and diastolic (congestive) heart failure ([place])    a. 08/2015  Echo: EF 55-60%, no rwma, mod MR, mildly dil LA/RA, mod TR, PASP 104mmHg; b. 05/2016 Echo: EF 30-35%, inflat/inf sev HK, Gr1 DD; c. 08/2017 Echo: EF 30-35%, mild LVH. Sev inf/infseptal HK. Gr1 DD. Mod MR. mildly dil LA. Mildly reduced RV fxn.  . Coronary artery disease    a. 08/2015 NSTEMI/PCI: LM nl, LAD 75p (med rx), 20 diffuse, 50d, LCX nl, OM1/2 nl, RCA 63m, RPAV 100 (2.25x12 Promus Premier DES) - complicated by 2:1 HB req temp wire  post-PCI; b. 04/2016 MV: EF 34%, lateral HK/infarct. No ischemia.  . Diverticulitis   . Generalized anxiety disorder   . Hepatic steatosis   . HLD (hyperlipidemia)   . Hypertension   . Hypokalemia   . Hypomagnesemia   . Hypothyroidism   . Ischemic cardiomyopathy    a. 08/2015  Echo: EF 55-60%; b. 05/2016 Echo: EF 30-35%; c. 08/2017 Echo: EF 30-35%.  . OA (osteoarthritis)   . Osteopenia   . Sleep apnea   . Stroke [facility])    a. 08/2015 Right PCA territory infarct/temoral lobe cerebral infarction-->MRA showed severe flow liminting stenosis of R P2 cerebral artery;  b. 08/2015 Carotid U/S: 1-39% bilat ICA stenosis.  . Subdural hemorrhage ([place])    a. 05/2013 in setting of fall.  . Thrombocytopenia ([place])   . Weakness    Past Surgical History:  Procedure Laterality Date  . CARDIAC CATHETERIZATION N/A 09/08/2015   Procedure: Temporary Pacemaker;  Surgeon: Leonie Man, MD;  Location: [place] [facility];  Service: Cardiovascular;  Laterality: N/A;  . CARDIAC CATHETERIZATION N/A 09/08/2015   Procedure: Left Heart Cath and Coronary Angiography;  Surgeon: Leonie Man, MD;  Location: [place] [facility];  Service: Cardiovascular;  Laterality: N/A;  . CARDIAC CATHETERIZATION N/A 09/08/2015   Procedure: Coronary Stent Intervention;  Surgeon: Leonie Man, MD;  Location: [place] [facility];  Service: Cardiovascular;  Laterality: N/A;  . CARDIAC CATHETERIZATION  09/08/2015   Procedure: Central Line Insertion;  Surgeon: Leonie Man, MD;  Location: [place] [facility];  Service: Cardiovascular;;  .  CARDIAC CATHETERIZATION  09/08/2015   Procedure: Arterial Line Insertion;  Surgeon: Leonie Man, MD;  Location: [place] [facility];  Service: Cardiovascular;;  . CARDIAC CATHETERIZATION N/A 09/09/2015   Procedure: Temporary Wire;  Surgeon: Lorretta Harp, MD;  Location: [place] [facility];  Service: Cardiovascular;  Laterality: N/A;  . ELBOW SURGERY    . LEFT HEART CATH AND CORONARY ANGIOGRAPHY N/A  09/22/2017   Procedure: LEFT HEART CATH AND CORONARY ANGIOGRAPHY;  Surgeon: [name], MD;  Location: [place] [facility];  Service: Cardiovascular;  Laterality: N/A;    Social History:  reports that she has never smoked. She has never used smokeless tobacco. She reports that she does not drink alcohol and does not use drugs.   Allergies  Allergen Reactions  . Codeine Nausea And Vomiting  . Flomax [Tamsulosin] Other (See Comments)    Pt states that this medication gave her a kidney infection.    Lebron Quam [Hydrocodone-Acetaminophen] Nausea And Vomiting  . Sulfa Antibiotics Nausea And Vomiting  . Atorvastatin Other (See Comments)    Leg pain, leg weakness    Family History  Problem Relation Age of Onset  . Hypertension Mother   . Hyperlipidemia Mother   . Stroke Mother   . Diabetes Sister   . Hypertension Sister   . Hyperlipidemia Sister   . Bipolar disorder Sister   . Hyperlipidemia Sister   . Stroke Sister   . Prostate cancer Brother   . Breast cancer Neg Hx       Prior to Admission medications   Medication Sig Start Date End Date Taking? Authorizing Provider  acetaminophen (TYLENOL) 325 MG tablet Take 650 mg by mouth every 6 (six) hours as needed for mild pain, fever or headache.     [provider]  ALPRAZolam Duanne Moron) 0.25 MG tablet Take 1 tablet (0.25 mg total) by mouth at bedtime as needed for anxiety. 07/10/16   Gladstone Lighter, MD  aspirin 81 MG chewable tablet Chew 1 tablet (81 mg total) by mouth daily. 09/13/15   Lyda Jester M, PA-C  carvedilol (COREG) 6.25 MG tablet TAKE (1) TABLET BY MOUTH TWICE DAILY 10/17/18   Rise Mu, PA-C  Coenzyme Q10 (CO Q 10 PO) Take by mouth daily.    [provider]  folic acid-pyridoxine-cyancobalamin (FOLBIC) 2.5-25-2 MG TABS tablet Take 1 tablet by mouth daily. 12/13/18   [provider]  hydroxypropyl methylcellulose / hypromellose (ISOPTO TEARS / GONIOVISC) 2.5 % ophthalmic solution Place 1  drop into both eyes 4 (four) times daily as needed for dry eyes.    [provider]  levothyroxine (SYNTHROID, LEVOTHROID) 25 MCG tablet Take 25 mcg by mouth daily before breakfast.     [provider]  lisinopril (PRINIVIL,ZESTRIL) 40 MG tablet Take 20 mg by mouth daily.     [provider]  Multiple Vitamins-Minerals (MULTIVITAMIN GUMMIES ADULT) CHEW Chew 2 each by mouth at bedtime.    [provider]  Multiple Vitamins-Minerals (PRESERVISION AREDS 2) CAPS Take 1 capsule by mouth daily.    [provider]  omeprazole (PRILOSEC) 20 MG capsule Take 20 mg by mouth daily before breakfast.     [provider]  oxybutynin (DITROPAN) 5 MG tablet Take 1 tablet by mouth 2 (two) times daily. 05/23/19   [provider]  polyethylene glycol (MIRALAX / GLYCOLAX) packet Take 17 g by mouth daily as needed for mild constipation. 07/10/16   Gladstone Lighter, MD  Simethicone 80 MG  TABS Take 80 mg by mouth daily as needed (gas).     [provider]  nitroGLYCERIN (NITROSTAT) 0.4 MG SL tablet Place 1 tablet (0.4 mg total) under the tongue every 5 (five) minutes x 3 doses as needed for chest pain. Patient not taking: Reported on 12/02/2018 09/13/15 01/09/19  Consuelo Pandy, PA-C    Physical Exam: BP (!) 169/73   Pulse 66   Temp 97.6 F (36.4 C) (Oral)   Resp 18   Ht 5\' 2"  (1.575 m)   Wt 49.9 kg   SpO2 97%   BMI 20.12 kg/m   . General: 87 y.o. year-old female well developed well nourished in no acute distress.  Alert and oriented x3. . Cardiovascular: Regular rate and rhythm with no rubs or gallops.  No thyromegaly or JVD noted.  No lower extremity edema. 2/4 pulses in all 4 extremities. Marland Kitchen Respiratory: Clear to auscultation with no wheezes or rales. Good inspiratory effort. . Abdomen: Soft periumbilical tenderness nondistended with normal bowel sounds x4 quadrants. . Muskuloskeletal: No cyanosis, clubbing or edema noted  bilaterally . Neuro: CN II-XII intact, strength, sensation, reflexes . Skin: No ulcerative lesions noted or rashes . Psychiatry: Judgement and insight appear normal. Mood is appropriate for condition and setting          Labs on Admission:  Basic Metabolic Panel: Recent Labs  Lab 07/13/20 2045 07/14/20 0223 07/17/20 1049  NA 139 141 139  K 3.4* 4.8 3.6  CL 104 106 103  CO2 26 27 28   GLUCOSE 160* 137* 113*  BUN 16 16 21   CREATININE 0.91 0.82 0.87  CALCIUM 9.7 9.3 10.2   Liver Function Tests: Recent Labs  Lab 07/13/20 2045 07/17/20 1049  AST 37 21  ALT 18 14  ALKPHOS 63 66  BILITOT 1.6* 0.9  PROT 6.5 6.5  ALBUMIN 3.9 3.9   Recent Labs  Lab 07/13/20 2045 07/17/20 1049  LIPASE 38 37   No results for input(s): AMMONIA in the last 168 hours. CBC: Recent Labs  Lab 07/13/20 2045 07/14/20 0223 07/15/20 0336 07/17/20 1049  WBC 10.0 9.1 4.0 5.0  NEUTROABS 8.8*  --   --  3.6  HGB 15.0 13.8 11.6* 14.2  HCT 43.4 40.3 34.1* 41.0  MCV 92.9 92.9 92.7 92.8  PLT 103* 86* 71* 100*   Cardiac Enzymes: No results for input(s): CKTOTAL, CKMB, CKMBINDEX, TROPONINI in the last 168 hours.  BNP (last 3 results) No results for input(s): BNP in the last 8760 hours.  ProBNP (last 3 results) No results for input(s): PROBNP in the last 8760 hours.  CBG: No results for input(s): GLUCAP in the last 168 hours.  Radiological Exams on Admission: CT Abdomen Pelvis W Contrast  Result Date: 07/17/2020 CLINICAL DATA:  Abdominal pain EXAM: CT ABDOMEN AND PELVIS WITH CONTRAST TECHNIQUE: Multidetector CT imaging of the abdomen and pelvis was performed using the standard protocol following bolus administration of intravenous contrast. CONTRAST:  30mL OMNIPAQUE IOHEXOL 300 MG/ML  SOLN COMPARISON:  07/13/2020 FINDINGS: Lower chest: Left basilar scarring or atelectasis. Coronary artery calcification. Hepatobiliary: No focal liver abnormality is seen. No gallstones, gallbladder wall thickening,  or biliary dilatation. Pancreas: Unremarkable. No pancreatic ductal dilatation or surrounding inflammatory changes. Spleen: Mild splenomegaly. Unchanged subcentimeter hypodensities within the spleen. Adrenals/Urinary Tract: Unremarkable adrenal glands. Stable bilateral renal cysts. Suspicious renal lesion. No stone or hydronephrosis. Urinary bladder is largely obscured by streak artifact within the pelvis. Stomach/Bowel: Progressive pancolonic wall thickening most pronounced within the transverse  colon (series 2, images 38-48). Scattered diverticulosis, most pronounced within the sigmoid colon. No dilated loops of bowel to suggest obstruction. Stomach appears within normal limits. Vascular/Lymphatic: Extensive atherosclerosis throughout the aortoiliac axis. No aneurysm. No large vessel occlusion evident on portal venous phase imaging. No abdominopelvic lymphadenopathy. Reproductive: No definite abnormality. Intrapelvic structures largely obscured by streak artifact. Other: No free fluid. No abdominopelvic fluid collection. No pneumoperitoneum. No abdominal wall hernia. Musculoskeletal: Previous right hip arthroplasty. Thoracolumbar dextroscoliotic curvature. Advanced thoracolumbar degenerative disc disease and degenerative facet arthropathy. No new or acute osseous findings. IMPRESSION: 1. Progressive pancolonic wall thickening most pronounced within the transverse colon, consistent with a nonspecific infectious or inflammatory colitis. 2. Sigmoid diverticulosis. 3. Mild splenomegaly. 4. Aortic atherosclerosis (ICD10-I70.0). Electronically Signed   By: Davina Poke D.O.   On: 07/17/2020 14:11    EKG: I independently viewed the EKG done and my findings are as followed: Sinus rhythm rate of 71, nonspecific ST-T changes.  QTc 431.  Assessment/Plan Present on Admission: . Colitis  Active Problems:   Colitis  Nonspecific colitis, possibly infectious Discharged from the hospital on 07/16/2020, day prior to  presentation, for the same Presents with lower abdominal pain. No reported vomiting and no diarrhea. CT abdomen and pelvis with contrast done on 07/17/2020 revealed:progressive pancolonic wall thickening most pronounced within the transverse colon, consistent with a nonspecific infectious or inflammatory colitis.   EDP started Unasyn, continue. Currently afebrile with no leukocytosis. Lipase level normal Liver chemistries levels normal Lactic acid level normal  Elevated troponin likely demand ischemia in setting of acute illness Denies any anginal symptoms Troponin 32 on presentation Obtain 2D echo Cycle troponin x2 Admission twelve-lead EKG in sinus rhythm with no evidence of acute ischemia  CAD/Chronic systolic CHF Euvolemic on exam No anginal symptoms No evidence of acute ischemia on 12 lead EKG Last 2D echo done on 09/14/2017 showed LVEF of 30 to 35%. Follow 2D echo Resume home regimen At discharge recommend close follow-up with cardiology.  Hypothyroidism Resume home regimen  Chronic anxiety Resume home regimen.   DVT prophylaxis: Subcu Lovenox daily.  Code Status: DNR.  Family Communication: Son at bedside.  Disposition Plan: Admit to MedSurg unit with remote telemetry.  Consults called: None.  Admission status: Observation status.   Status is: Observation    Dispo:  Patient From: Home  Planned Disposition: Home, likely tomorrow 07/18/2020 with oral antibiotics with close follow-up appointment with GI.  Medically stable for discharge: No ongoing management of lower abdominal pain secondary to unspecified colitis.      Kayleen Memos MD [facility] Pager [phone]  If 7PM-7AM, please contact night-coverage [email or web address] Password [facility]  07/17/2020, 3:18 PM

## 2020-07-17 NOTE — ED Notes (Signed)
Pt back from CT at this time. Resent lactic acid tube at this time

## 2020-07-17 NOTE — ED Triage Notes (Addendum)
Pt arrived via [facility] from home with c/o abdominal pain. Per EMS, pt states she feels like she cannot have a BM but did have one this AM.   Per EMS, pt ambulatory to stretcher, NAD noted, VSS  Per EMS, pt seen at [facility] Friday for emesis.   Pt A&Ox4, able to answer questions appropriately and follow commands. Pt gives verbal consent for MSE waiver at this time, unable to reach e-sign pad due to pt condition at this time

## 2020-07-17 NOTE — ED Notes (Signed)
Called lab to verify that this RN sent gray top tube down for lactic acid. Per lab, this RN did send and they will run it.

## 2020-07-17 NOTE — ED Provider Notes (Signed)
[facility] Emergency Department Provider Note   ____________________________________________   Event Date/Time   First MD Initiated Contact with Patient 07/17/20 1040     (approximate)  I have reviewed the triage vital signs and the nursing notes.   HISTORY  Chief Complaint Abdominal Pain    HPI Teresa Franklin is a 87 y.o. female with past medical history of hypertension, CAD, and chronic ITP who presents to the ED for abdominal pain.  Patient was discharged from the hospital yesterday for abdominal pain secondary to nonspecific colitis.  Patient states that she has not been feeling well since then, continues to have flareups of pain along with poor appetite.  She has been nauseous but has not vomited, does state that she had a normal bowel movement earlier today.  She denies any fevers, dysuria, or flank pain.  She has not taken any medications for this at home.  Her son states that "last time she had pain like this she had a heart attack."        Past Medical History:  Diagnosis Date  . Anxiety   . AV block    a. 08/2015 in setting of NSTEMI and RPAV intervention-->required temp wire but not PPM.  . Back pain   . Chronic combined systolic (congestive) and diastolic (congestive) heart failure ([place])    a. 08/2015  Echo: EF 55-60%, no rwma, mod MR, mildly dil LA/RA, mod TR, PASP 50mmHg; b. 05/2016 Echo: EF 30-35%, inflat/inf sev HK, Gr1 DD; c. 08/2017 Echo: EF 30-35%, mild LVH. Sev inf/infseptal HK. Gr1 DD. Mod MR. mildly dil LA. Mildly reduced RV fxn.  . Coronary artery disease    a. 08/2015 NSTEMI/PCI: LM nl, LAD 75p (med rx), 20 diffuse, 50d, LCX nl, OM1/2 nl, RCA 18m, RPAV 100 (2.25x12 Promus Premier DES) - complicated by 2:1 HB req temp wire post-PCI; b. 04/2016 MV: EF 34%, lateral HK/infarct. No ischemia.  . Diverticulitis   . Generalized anxiety disorder   . Hepatic steatosis   . HLD (hyperlipidemia)   . Hypertension   . Hypokalemia   . Hypomagnesemia    . Hypothyroidism   . Ischemic cardiomyopathy    a. 08/2015  Echo: EF 55-60%; b. 05/2016 Echo: EF 30-35%; c. 08/2017 Echo: EF 30-35%.  . OA (osteoarthritis)   . Osteopenia   . Sleep apnea   . Stroke [facility])    a. 08/2015 Right PCA territory infarct/temoral lobe cerebral infarction-->MRA showed severe flow liminting stenosis of R P2 cerebral artery;  b. 08/2015 Carotid U/S: 1-39% bilat ICA stenosis.  . Subdural hemorrhage ([place])    a. 05/2013 in setting of fall.  . Thrombocytopenia ([place])   . Weakness     Patient Active Problem List   Diagnosis Date Noted  . Colitis 07/17/2020  . Acute colitis 07/13/2020  . Chronic ITP (idiopathic thrombocytopenic purpura) (HCC) 12/06/2019  . Monoclonal gammopathy 12/02/2018  . Coronary artery disease involving native coronary artery of native heart with angina pectoris ([place])   . Anxiety disorder 08/05/2016  . Preop cardiovascular exam   . Elbow fracture, right 07/07/2016  . Thrombocytopenia ([place]) 11/12/2015  . Systolic congestive heart failure, NYHA class 3 ([place]) 10/18/2015  . Diastolic CHF, acute on chronic ([place]) 09/17/2015  . SOB (shortness of breath)   . UTI (urinary tract infection) 09/16/2015  . Acute right MCA stroke ([place])   . Fall   . Hypokalemia 09/13/2015  . Hypophosphatemia 09/12/2015  . Encounter for intubation   . Acute  respiratory failure ([place])   . Complete heart block ([place]) 09/08/2015  . Acute blood loss anemia -thought to be related to in her muscular injection of Phenergan 09/08/2015  . Acute respiratory failure with hypoxia ([place]) 09/08/2015  . Heart block AV second degree   . Essential hypertension   . Arterial hypotension   . Cardiogenic shock ([place])   . NSTEMI (non-ST elevated myocardial infarction) ([place]) 09/06/2015  . Subdural hemorrhage (HCC) -history of     Class: History of  . Hyperlipidemia with target LDL less than 70   . Hepatic steatosis   . Acquired hypothyroidism 10/14/2010    Past Surgical History:  Procedure  Laterality Date  . CARDIAC CATHETERIZATION N/A 09/08/2015   Procedure: Temporary Pacemaker;  Surgeon: Leonie Man, MD;  Location: [place] [facility];  Service: Cardiovascular;  Laterality: N/A;  . CARDIAC CATHETERIZATION N/A 09/08/2015   Procedure: Left Heart Cath and Coronary Angiography;  Surgeon: Leonie Man, MD;  Location: [place] [facility];  Service: Cardiovascular;  Laterality: N/A;  . CARDIAC CATHETERIZATION N/A 09/08/2015   Procedure: Coronary Stent Intervention;  Surgeon: Leonie Man, MD;  Location: [place] [facility];  Service: Cardiovascular;  Laterality: N/A;  . CARDIAC CATHETERIZATION  09/08/2015   Procedure: Central Line Insertion;  Surgeon: Leonie Man, MD;  Location: [place] [facility];  Service: Cardiovascular;;  . CARDIAC CATHETERIZATION  09/08/2015   Procedure: Arterial Line Insertion;  Surgeon: Leonie Man, MD;  Location: [place] [facility];  Service: Cardiovascular;;  . CARDIAC CATHETERIZATION N/A 09/09/2015   Procedure: Temporary Wire;  Surgeon: Lorretta Harp, MD;  Location: [place] [facility];  Service: Cardiovascular;  Laterality: N/A;  . ELBOW SURGERY    . LEFT HEART CATH AND CORONARY ANGIOGRAPHY N/A 09/22/2017   Procedure: LEFT HEART CATH AND CORONARY ANGIOGRAPHY;  Surgeon: [name], MD;  Location: [facility];  Service: Cardiovascular;  Laterality: N/A;    Prior to Admission medications   Medication Sig Start Date End Date Taking? Authorizing Provider  acetaminophen (TYLENOL) 325 MG tablet Take 650 mg by mouth every 6 (six) hours as needed for mild pain, fever or headache.     [provider]  ALPRAZolam Duanne Moron) 0.25 MG tablet Take 1 tablet (0.25 mg total) by mouth at bedtime as needed for anxiety. 07/10/16   Gladstone Lighter, MD  aspirin 81 MG chewable tablet Chew 1 tablet (81 mg total) by mouth daily. 09/13/15   Lyda Jester M, PA-C  carvedilol (COREG) 6.25 MG tablet TAKE (1) TABLET BY MOUTH TWICE DAILY 10/17/18   Rise Mu, PA-C  Coenzyme Q10 (CO Q 10 PO) Take by mouth daily.    [provider]  folic acid-pyridoxine-cyancobalamin (FOLBIC) 2.5-25-2 MG TABS tablet Take 1 tablet by mouth daily. 12/13/18   [provider]  hydroxypropyl methylcellulose / hypromellose (ISOPTO TEARS / GONIOVISC) 2.5 % ophthalmic solution Place 1 drop into both eyes 4 (four) times daily as needed for dry eyes.    [provider]  levothyroxine (SYNTHROID, LEVOTHROID) 25 MCG tablet Take 25 mcg by mouth daily before breakfast.     [provider]  lisinopril (PRINIVIL,ZESTRIL) 40 MG tablet Take 20 mg by mouth daily.     [provider]  Multiple Vitamins-Minerals (MULTIVITAMIN GUMMIES ADULT) CHEW Chew 2 each by mouth at bedtime.    [provider]  Multiple Vitamins-Minerals (PRESERVISION AREDS 2) CAPS Take 1 capsule by mouth daily.    [provider]  omeprazole (PRILOSEC) 20 MG capsule Take 20 mg by mouth daily before breakfast.     [provider]  oxybutynin (DITROPAN) 5 MG tablet Take 1 tablet by mouth 2 (two) times daily. 05/23/19   [provider]  polyethylene glycol (MIRALAX / GLYCOLAX) packet Take 17 g by mouth daily as needed for mild constipation. 07/10/16   Gladstone Lighter, MD  Simethicone 80 MG TABS Take 80 mg by mouth daily as needed (gas).     [provider]  nitroGLYCERIN (NITROSTAT) 0.4 MG SL tablet Place 1 tablet (0.4 mg total) under the tongue every 5 (five) minutes x 3 doses as needed for chest pain. Patient not taking: Reported on 12/02/2018 09/13/15 01/09/19  Lyda Jester M, PA-C    Allergies Codeine, Flomax [tamsulosin], Norco [hydrocodone-acetaminophen], Sulfa antibiotics, and Atorvastatin  Family History  Problem Relation Age of Onset  . Hypertension Mother   . Hyperlipidemia Mother   . Stroke Mother   . Diabetes Sister   . Hypertension Sister   . Hyperlipidemia Sister   . Bipolar disorder Sister   .  Hyperlipidemia Sister   . Stroke Sister   . Prostate cancer Brother   . Breast cancer Neg Hx     Social History Social History   Tobacco Use  . Smoking status: Never Smoker  . Smokeless tobacco: Never Used  Substance Use Topics  . Alcohol use: No    Alcohol/week: 0.0 standard drinks  . Drug use: No    Review of Systems  Constitutional: No fever/chills Eyes: No visual changes. ENT: No sore throat. Cardiovascular: Denies chest pain. Respiratory: Denies shortness of breath. Gastrointestinal: Positive for abdominal pain and nausea, no vomiting.  No diarrhea.  No constipation. Genitourinary: Negative for dysuria. Musculoskeletal: Negative for back pain. Skin: Negative for rash. Neurological: Negative for headaches, focal weakness or numbness.  ____________________________________________   PHYSICAL EXAM:  VITAL SIGNS: ED Triage Vitals  Enc Vitals Group     BP 07/17/20 1045 (!) 158/84     Pulse Rate 07/17/20 1045 74     Resp 07/17/20 1045 18     Temp 07/17/20 1045 97.6 F (36.4 C)     Temp Source 07/17/20 1045 Oral     SpO2 07/17/20 1045 100 %     Weight 07/17/20 1046 110 lb (49.9 kg)     Height 07/17/20 1046 5\' 2"  (1.575 m)     Head Circumference --      Peak Flow --      Pain Score 07/17/20 1046 8     Pain Loc --      Pain Edu? --      Excl. in [place]? --     Constitutional: Alert and oriented. Eyes: Conjunctivae are normal. Head: Atraumatic. Nose: No congestion/rhinnorhea. Mouth/Throat: Mucous membranes are moist. Neck: Normal ROM Cardiovascular: Normal rate, regular rhythm. Grossly normal heart sounds. Respiratory: Normal respiratory effort.  No retractions. Lungs CTAB. Gastrointestinal: Soft and tender to palpation bilateral lower quadrants with no rebound or guarding.  No distention. Genitourinary: deferred Musculoskeletal: No lower extremity tenderness nor edema. Neurologic:  Normal speech and language. No gross focal neurologic deficits are  appreciated. Skin:  Skin is warm, dry and intact. No rash noted. Psychiatric: Mood and affect are normal. Speech and behavior are normal.  ____________________________________________   LABS (all labs ordered are listed, but only abnormal results are displayed)  Labs Reviewed  CBC WITH DIFFERENTIAL/PLATELET - Abnormal; Notable for the following components:  Result Value   Platelets 100 (*)    All other components within normal limits  COMPREHENSIVE METABOLIC PANEL - Abnormal; Notable for the following components:   Glucose, Bld 113 (*)    All other components within normal limits  URINALYSIS, COMPLETE (UACMP) WITH MICROSCOPIC - Abnormal; Notable for the following components:   Color, Urine YELLOW (*)    APPearance CLEAR (*)    Hgb urine dipstick SMALL (*)    Bacteria, UA RARE (*)    All other components within normal limits  TROPONIN I (HIGH SENSITIVITY) - Abnormal; Notable for the following components:   Troponin I (High Sensitivity) 33 (*)    All other components within normal limits  RESP PANEL BY RT-PCR (FLU A&B, COVID) ARPGX2  LIPASE, BLOOD  LACTIC ACID, PLASMA  LACTIC ACID, PLASMA  CBC  CREATININE, SERUM  TROPONIN I (HIGH SENSITIVITY)   ____________________________________________  EKG  ED ECG REPORT I, Blake Divine, the attending physician, personally viewed and interpreted this ECG.   Date: 07/17/2020  EKG Time: 10:50  Rate: 71  Rhythm: normal sinus rhythm  Axis: Normal  Intervals:none  ST&T Change: None   PROCEDURES  Procedure(s) performed (including Critical Care):  Procedures   ____________________________________________   INITIAL IMPRESSION / ASSESSMENT AND PLAN / ED COURSE       87 year old female with past medical history of hypertension, CAD, and chronic ITP who presents to the ED for ongoing abdominal pain with poor p.o. intake following recent surgery for colitis.  Her son reports concern for MI but I have low suspicion for this  as EKG shows no evidence of arrhythmia or ischemia and patient denies any anginal symptoms.  Troponin mildly elevated and we will trend, but low suspicion for ACS at this time.  Remainder of labs are reassuring, UA shows no signs of infection.  CT scan does show worsening colitis compared to previous suspect this is infectious in etiology and will treat with antibiotics.  Given her worsening pain with poor p.o. intake, case discussed with hospitalist for admission.      ____________________________________________   FINAL CLINICAL IMPRESSION(S) / ED DIAGNOSES  Final diagnoses:  Colitis  Elevated troponin     ED Discharge Orders    None       Note:  This document was prepared using Dragon voice recognition software and may include unintentional dictation errors.   Blake Divine, MD 07/17/20 [phone]

## 2020-07-18 DIAGNOSIS — I5022 Chronic systolic (congestive) heart failure: Secondary | ICD-10-CM | POA: Diagnosis not present

## 2020-07-18 DIAGNOSIS — R778 Other specified abnormalities of plasma proteins: Secondary | ICD-10-CM

## 2020-07-18 DIAGNOSIS — K529 Noninfective gastroenteritis and colitis, unspecified: Secondary | ICD-10-CM

## 2020-07-18 LAB — ECHOCARDIOGRAM LIMITED
AR max vel: 1.69 cm2
AV Area VTI: 1.79 cm2
AV Area mean vel: 1.59 cm2
AV Mean grad: 7 mmHg
AV Peak grad: 11.9 mmHg
Ao pk vel: 1.72 m/s
Area-P 1/2: 2.8 cm2
Height: 62 in
S' Lateral: 3.5 cm
Weight: 1760.01 oz

## 2020-07-18 LAB — BASIC METABOLIC PANEL
Anion gap: 8 (ref 5–15)
BUN: 22 mg/dL (ref 8–23)
CO2: 26 mmol/L (ref 22–32)
Calcium: 9.7 mg/dL (ref 8.9–10.3)
Chloride: 105 mmol/L (ref 98–111)
Creatinine, Ser: 0.66 mg/dL (ref 0.44–1.00)
GFR, Estimated: >60 mL/min (ref >60)
Glucose, Bld: 113 mg/dL — ABNORMAL HIGH (ref 70–99)
Potassium: 3.6 mmol/L (ref 3.5–5.1)
Sodium: 139 mmol/L (ref 135–145)

## 2020-07-18 LAB — CBC
HCT: 35.2 % — ABNORMAL LOW (ref 36.0–46.0)
Hemoglobin: 12 g/dL (ref 12.0–15.0)
MCH: 32.3 pg (ref 26.0–34.0)
MCHC: 34.1 g/dL (ref 30.0–36.0)
MCV: 94.9 fL (ref 80.0–100.0)
Platelets: 98 10*3/uL — ABNORMAL LOW (ref 150–400)
RBC: 3.71 MIL/uL — ABNORMAL LOW (ref 3.87–5.11)
RDW: 13.7 % (ref 11.5–15.5)
WBC: 4.9 10*3/uL (ref 4.0–10.5)
nRBC: 0 % (ref 0.0–0.2)

## 2020-07-18 MED ORDER — POLYETHYLENE GLYCOL 3350 17 GM/SCOOP PO POWD
1.0000 | Freq: Once | ORAL | Status: AC
  Administered 2020-07-18: 255 g via ORAL
  Filled 2020-07-18: qty 255

## 2020-07-18 MED ORDER — BOOST / RESOURCE BREEZE PO LIQD CUSTOM
1.0000 | Freq: Three times a day (TID) | ORAL | Status: DC
  Administered 2020-07-19 – 2020-07-20 (×2): 1 via ORAL

## 2020-07-18 MED ORDER — HYDROCORTISONE 1 % EX CREA
1.0000 "application " | TOPICAL_CREAM | Freq: Three times a day (TID) | CUTANEOUS | Status: DC | PRN
  Administered 2020-07-18: 1 via TOPICAL
  Filled 2020-07-18: qty 28

## 2020-07-18 NOTE — Evaluation (Signed)
Physical Therapy Evaluation Patient Details Name: Teresa Franklin MRN: [id number] DOB: 19-May-1930 Today's Date: 07/18/2020   History of Present Illness  Teresa Franklin is an 37yoF who comes to [facility] on 4/27 with lower ABD pain, recently DC from here on 4/26 for same. PMH: CAD, HTN, GAD, hypoTSH, CVA. Pt admitted for suspected collitis. Pt seen by surgery services on 4/24 who reported high suspicioun for gatroenteritis s/p [employer].  Clinical Impression  Pt admitted with above diagnosis. Pt currently with functional limitations due to the deficits listed below (see "PT Problem List"). Patient agreeable to PT evaluation. Patient provides detailed description of PLOF and home environment. Patient's assessment this date reveals the patient requires an additional person present for safety to complete their typical ADL, bu tonly because of IV line- pt otherwise is at baseline. Patient is at baseline, all education completed, and time is given to address all questions/concerns. No additional skilled PT services needed at this time, PT signing off. PT recommends daily ambulation ad lib or with nursing staff as needed to prevent deconditioning.      Follow Up Recommendations No PT follow up    Equipment Recommendations  None recommended by PT    Recommendations for Other Services       Precautions / Restrictions Precautions Precautions: None Restrictions Weight Bearing Restrictions: No      Mobility  Bed Mobility Overal bed mobility: Independent                  Transfers Overall transfer level: Independent Equipment used: None                Ambulation/Gait Ambulation/Gait assistance: Modified independent (Device/Increase time) Gait Distance (Feet): 460 Feet Assistive device: None Gait Pattern/deviations: WFL(Within Functional Limits) Gait velocity: 0.42m/s   General Gait Details: author drives IV pole  Stairs            Wheelchair Mobility    Modified  [place] (Stroke Patients Only)       Balance Overall balance assessment: Independent                                           Pertinent Vitals/Pain Pain Assessment: 0-10 Pain Score: 0-No pain (non-unless having a  BM.) Pain Location: ABD pain    Home Living Family/patient expects to be discharged to:: Private residence Living Arrangements: Alone Available Help at Discharge: Family (so about 20 min away) Type of Home: House Home Access: Stairs to enter   [employer] of Steps: 1 Home Layout: One level Home Equipment: Walker - 2 wheels;Bedside commode      Prior Function Level of Independence: Independent         Comments: walks daily for exercise, drives, groceries.     Hand Dominance   Dominant Hand: Right    Extremity/Trunk Assessment   Upper Extremity Assessment Upper Extremity Assessment: Overall WFL for tasks assessed    Lower Extremity Assessment Lower Extremity Assessment: Overall WFL for tasks assessed    Cervical / Trunk Assessment Cervical / Trunk Assessment: Normal  Communication      Cognition Arousal/Alertness: Awake/alert Behavior During Therapy: WFL for tasks assessed/performed Overall Cognitive Status: Within Functional Limits for tasks assessed  General Comments      Exercises     Assessment/Plan    PT Assessment Teresa Franklin does not need any further PT services  PT Problem List         PT Treatment Interventions      PT Goals (Current goals can be found in the Care Plan section)  Acute Rehab PT Goals PT Goal Formulation: All assessment and education complete, DC therapy    Frequency     Barriers to discharge        Co-evaluation               AM-PAC PT "6 Clicks" Mobility  Outcome Measure Help needed turning from your back to your side while in a flat bed without using bedrails?: None Help needed moving from lying on your back to  sitting on the side of a flat bed without using bedrails?: None Help needed moving to and from a bed to a chair (including a wheelchair)?: None Help needed standing up from a chair using your arms (e.g., wheelchair or bedside chair)?: None Help needed to walk in hospital room?: A Little Help needed climbing 3-5 steps with a railing? : A Little 6 Click Score: 22    End of Session   Activity Tolerance: Patient tolerated treatment well;No increased pain Patient left: in bed;with family/visitor present;with call bell/phone within reach Nurse Communication: Mobility status      Time: 9379-0240 PT Time Calculation (min) (ACUTE ONLY): 21 min   Charges:   PT Evaluation $PT Eval Low Complexity: 1 Low          12:11 PM, 07/18/20 Etta Grandchild, PT, DPT Physical Therapist - [facility]  [phone] ([place])    Barlow C 07/18/2020, 12:02 PM

## 2020-07-18 NOTE — Care Management Obs Status (Signed)
[facility] NOTIFICATION   Patient Details  Name: Teresa Franklin MRN: [id number] Date of Birth: 11/14/1930   Medicare Observation Status Notification Given:  Yes    Candie Chroman, LCSW 07/18/2020, 3:28 PM

## 2020-07-18 NOTE — Consult Note (Signed)
Cephas Darby, MD [address]  [place]  [place], [place] [address]  Main: [phone]  Fax: [phone] Pager: [phone]   Consultation  Referring Provider:     No ref. provider found Primary Care Physician:  [name], MD Primary Gastroenterologist: Althia Forts         Reason for Consultation:     Colitis, abdominal pain  Date of Admission:  07/17/2020 Date of Consultation:  07/18/2020         HPI:   Teresa Franklin is a 87 y.o. female with history of coronary artery artery disease, hypertension, hypothyroidism, history of CVA admitted with recurrent lower abdominal pain.  Patient was recently discharged on 4/26 after she was managed for lower abdominal pain and CT revealed transverse colitis.  General surgery was on board, thought to be infectious etiology.  Her symptoms improved with conservative management and antibiotics.  She had a repeat CT scan during this admission which revealed progressive pancolonic wall thickening more pronounced within the transverse colon.  She had stool studies which were negative for infectious etiology including C. difficile.  No evidence of leukocytosis, normal serum lactic acid levels.  She is on empiric antibiotics.  GI is consulted for further evaluation  When I interviewed the patient, she denied abdominal pain, nausea or vomiting which she experienced during initial admission.  Her son was bedside.  Patient reports decreased appetite. Patient does not smoke or drink alcohol  Patient is fairly independent and lives alone  NSAIDs: None  Antiplts/Anticoagulants/Anti thrombotics: Aspirin 81  GI Procedures: Does not recall when she had her last colonoscopy  Past Medical History:  Diagnosis Date  . Anxiety   . AV block    a. 08/2015 in setting of NSTEMI and RPAV intervention-->required temp wire but not PPM.  . Back pain   . Chronic combined systolic (congestive) and diastolic (congestive) heart failure ([place])    a. 08/2015   Echo: EF 55-60%, no rwma, mod MR, mildly dil LA/RA, mod TR, PASP 28mHg; b. 05/2016 Echo: EF 30-35%, inflat/inf sev HK, Gr1 DD; c. 08/2017 Echo: EF 30-35%, mild LVH. Sev inf/infseptal HK. Gr1 DD. Mod MR. mildly dil LA. Mildly reduced RV fxn.  . Coronary artery disease    a. 08/2015 NSTEMI/PCI: LM nl, LAD 75p (med rx), 20 diffuse, 50d, LCX nl, OM1/2 nl, RCA 269mRPAV 100 (2.25x12 Promus Premier DES) - complicated by 2:1 HB req temp wire post-PCI; b. 04/2016 MV: EF 34%, lateral HK/infarct. No ischemia.  . Diverticulitis   . Generalized anxiety disorder   . Hepatic steatosis   . HLD (hyperlipidemia)   . Hypertension   . Hypokalemia   . Hypomagnesemia   . Hypothyroidism   . Ischemic cardiomyopathy    a. 08/2015  Echo: EF 55-60%; b. 05/2016 Echo: EF 30-35%; c. 08/2017 Echo: EF 30-35%.  . OA (osteoarthritis)   . Osteopenia   . Sleep apnea   . [facility]   a. 08/2015 Right PCA territory infarct/temoral lobe cerebral infarction-->MRA showed severe flow liminting stenosis of R P2 cerebral artery;  b. 08/2015 Carotid U/S: 1-39% bilat ICA stenosis.  . Subdural hemorrhage ([facility]   a. 05/2013 in setting of fall.  . [place]rombocytopenia ([facility]  . Weakness     Past Surgical History:  Procedure Laterality Date  . CARDIAC CATHETERIZATION N/A 09/08/2015   Procedure: Temporary Pacemaker;  Surgeon: DaLeonie ManMD;  [facility];  Service: Cardiovascular;  Laterality: N/A;  .  CARDIAC CATHETERIZATION N/A 09/08/2015   Procedure: Left Heart Cath and Coronary Angiography;  Surgeon: Leonie Man, MD;  Location: [place] [facility];  Service: Cardiovascular;  Laterality: N/A;  . CARDIAC CATHETERIZATION N/A 09/08/2015   Procedure: Coronary Stent Intervention;  Surgeon: Leonie Man, MD;  Location: [place] [facility];  Service: Cardiovascular;  Laterality: N/A;  . CARDIAC CATHETERIZATION  09/08/2015   Procedure: Central Line Insertion;  Surgeon: Leonie Man, MD;  Location: [place] [facility];   Service: Cardiovascular;;  . CARDIAC CATHETERIZATION  09/08/2015   Procedure: Arterial Line Insertion;  Surgeon: Leonie Man, MD;  Location: [place] [facility];  Service: Cardiovascular;;  . CARDIAC CATHETERIZATION N/A 09/09/2015   Procedure: Temporary Wire;  Surgeon: Lorretta Harp, MD;  Location: [place] [facility];  Service: Cardiovascular;  Laterality: N/A;  . ELBOW SURGERY    . LEFT HEART CATH AND CORONARY ANGIOGRAPHY N/A 09/22/2017   Procedure: LEFT HEART CATH AND CORONARY ANGIOGRAPHY;  Surgeon: [name], MD;  Location: [place] [facility];  Service: Cardiovascular;  Laterality: N/A;    Prior to Admission medications   Medication Sig Start Date End Date Taking? Authorizing Provider  acetaminophen (TYLENOL) 325 MG tablet Take 650 mg by mouth every 6 (six) hours as needed for mild pain, fever or headache.     [provider]  ALPRAZolam Duanne Moron) 0.25 MG tablet Take 1 tablet (0.25 mg total) by mouth at bedtime as needed for anxiety. 07/10/16   Gladstone Lighter, MD  aspirin 81 MG chewable tablet Chew 1 tablet (81 mg total) by mouth daily. 09/13/15   Lyda Jester M, PA-C  carvedilol (COREG) 6.25 MG tablet TAKE (1) TABLET BY MOUTH TWICE DAILY 10/17/18   Rise Mu, PA-C  Coenzyme Q10 (CO Q 10 PO) Take by mouth daily.    [provider]  folic acid-pyridoxine-cyancobalamin (FOLBIC) 2.5-25-2 MG TABS tablet Take 1 tablet by mouth daily. 12/13/18   [provider]  hydroxypropyl methylcellulose / hypromellose (ISOPTO TEARS / GONIOVISC) 2.5 % ophthalmic solution Place 1 drop into both eyes 4 (four) times daily as needed for dry eyes.    [provider]  levothyroxine (SYNTHROID, LEVOTHROID) 25 MCG tablet Take 25 mcg by mouth daily before breakfast.     [provider]  lisinopril (PRINIVIL,ZESTRIL) 40 MG tablet Take 20 mg by mouth daily.     [provider]  Multiple Vitamins-Minerals (MULTIVITAMIN GUMMIES ADULT) CHEW Chew 2 each by  mouth at bedtime.    [provider]  Multiple Vitamins-Minerals (PRESERVISION AREDS 2) CAPS Take 1 capsule by mouth daily.    [provider]  omeprazole (PRILOSEC) 20 MG capsule Take 20 mg by mouth daily before breakfast.     [provider]  oxybutynin (DITROPAN) 5 MG tablet Take 1 tablet by mouth 2 (two) times daily. 05/23/19   [provider]  polyethylene glycol (MIRALAX / GLYCOLAX) packet Take 17 g by mouth daily as needed for mild constipation. 07/10/16   Gladstone Lighter, MD  Simethicone 80 MG TABS Take 80 mg by mouth daily as needed (gas).     [provider]  nitroGLYCERIN (NITROSTAT) 0.4 MG SL tablet Place 1 tablet (0.4 mg total) under the tongue every 5 (five) minutes x 3 doses as needed for chest pain. Patient not taking: Reported on 12/02/2018 09/13/15 01/09/19  Lyda Jester M, PA-C    Current Facility-Administered Medications:  .  acetaminophen (TYLENOL) tablet 650 mg, 650 mg,  Oral, Q6H PRN, Kayleen Memos, DO .  ALPRAZolam Duanne Moron) tablet 0.25 mg, 0.25 mg, Oral, QHS PRN, Kayleen Memos, DO .  Ampicillin-Sulbactam (UNASYN) 3 g in sodium chloride 0.9 % 100 mL IVPB, 3 g, Intravenous, Q6H, Hall, Carole N, DO, Last Rate: 200 mL/hr at 07/18/20 1431, 3 g at 07/18/20 1431 .  aspirin chewable tablet 81 mg, 81 mg, Oral, Daily, Irene Pap N, DO, 81 mg at 07/18/20 0940 .  B-complex with vitamin C tablet 1 tablet, 1 tablet, Oral, Daily, Dorothe Pea, RPH, 1 tablet at 07/18/20 7078 .  carvedilol (COREG) tablet 3.125 mg, 3.125 mg, Oral, BID WC, Hall, Carole N, DO, 3.125 mg at 07/18/20 0940 .  enoxaparin (LOVENOX) injection 40 mg, 40 mg, Subcutaneous, Q24H, Hall, Carole N, DO, 40 mg at 07/17/20 2147 .  hydrALAZINE (APRESOLINE) injection 5 mg, 5 mg, Intravenous, Q6H PRN, [place] Crane, Carole N, DO .  levothyroxine (SYNTHROID) tablet 25 mcg, 25 mcg, Oral, QAC breakfast, Irene Pap N, DO, 25 mcg at 07/18/20 0522 .  lisinopril (ZESTRIL) tablet 20 mg, 20  mg, Oral, Daily, Hall, Carole N, DO, 20 mg at 07/18/20 0940 .  melatonin tablet 5 mg, 5 mg, Oral, QHS PRN, Irene Pap N, DO .  multivitamin with minerals tablet 1 tablet, 1 tablet, Oral, QHS, Dolan, Carissa E, RPH .  ondansetron (ZOFRAN) injection 4 mg, 4 mg, Intravenous, Q6H PRN, Hall, Carole N, DO .  oxybutynin (DITROPAN) tablet 5 mg, 5 mg, Oral, BID, Hall, Carole N, DO, 5 mg at 07/18/20 0940 .  pantoprazole (PROTONIX) EC tablet 40 mg, 40 mg, Oral, Daily, Hall, Carole N, DO, 40 mg at 07/18/20 0940 .  polyethylene glycol (MIRALAX / GLYCOLAX) packet 17 g, 17 g, Oral, Daily PRN, Hall, Carole N, DO .  polyethylene glycol powder (GLYCOLAX/MIRALAX) container 255 g, 1 Container, Oral, Once, Laurren Lepkowski, Tally Due, MD .  polyvinyl alcohol (LIQUIFILM TEARS) 1.4 % ophthalmic solution 1 drop, 1 drop, Both Eyes, PRN, Dorothe Pea, RPH .  simethicone (MYLICON) chewable tablet 80 mg, 80 mg, Oral, Daily PRN, Hall, Carole N, DO .  traMADol (ULTRAM) tablet 50 mg, 50 mg, Oral, Q6H PRN, Kayleen Memos, DO  Family History  Problem Relation Age of Onset  . Hypertension Mother   . Hyperlipidemia Mother   . Stroke Mother   . Diabetes Sister   . Hypertension Sister   . Hyperlipidemia Sister   . Bipolar disorder Sister   . Hyperlipidemia Sister   . Stroke Sister   . Prostate cancer Brother   . Breast cancer Neg Hx      Social History   Tobacco Use  . Smoking status: Never Smoker  . Smokeless tobacco: Never Used  Substance Use Topics  . Alcohol use: No    Alcohol/week: 0.0 standard drinks  . Drug use: No    Allergies as of 07/17/2020 - Review Complete 07/17/2020  Allergen Reaction Noted  . Codeine Nausea And Vomiting 09/05/2015  . Flomax [tamsulosin] Other (See Comments) 09/05/2015  . Norco [hydrocodone-acetaminophen] Nausea And Vomiting 09/05/2015  . Sulfa antibiotics Nausea And Vomiting 09/05/2015  . Atorvastatin Other (See Comments) 07/14/2020    Review of Systems:    All systems  reviewed and negative except where noted in HPI.   Physical Exam:  Vital signs in last 24 hours: Temp:  [97.7 F (36.5 C)-98.4 F (36.9 C)] 97.9 F (36.6 C) (04/28 0814) Pulse Rate:  [67-86] 86 (04/28 0814) Resp:  [16-20] 18 (04/28 0814) BP: (  129-194)/(58-100) 176/100 (04/28 0814) SpO2:  [97 %-100 %] 100 % (04/28 0814) Last BM Date: 07/17/20 General:   Pleasant, cooperative in NAD Head:  Normocephalic and atraumatic. Eyes:   No icterus.   Conjunctiva pink. PERRLA. Ears:  Normal auditory acuity. Neck:  Supple; no masses or thyroidomegaly Lungs: Respirations even and unlabored. Lungs clear to auscultation bilaterally.   No wheezes, crackles, or rhonchi.  Heart:  Regular rate and rhythm;  Without murmur, clicks, rubs or gallops Abdomen:  Soft, mildly distended, nontender. Normal bowel sounds. No appreciable masses or hepatomegaly.  No rebound or guarding.  Rectal:  Not performed. Msk:  Symmetrical without gross deformities.  Strength generalized weakness Extremities:  Without edema, cyanosis or clubbing. Neurologic:  Alert and oriented x3;  grossly normal neurologically. Skin:  Intact without significant lesions or rashes. Cervical Nodes:  No significant cervical adenopathy. Psych:  Alert and cooperative. Normal affect.  LAB RESULTS: CBC Latest Ref Rng & Units 07/18/2020 07/17/2020 07/15/2020  WBC 4.0 - 10.5 K/uL 4.9 5.0 4.0  Hemoglobin 12.0 - 15.0 g/dL 12.0 14.2 11.6(L)  Hematocrit 36.0 - 46.0 % 35.2(L) 41.0 34.1(L)  Platelets 150 - 400 K/uL 98(L) 100(L) 71(L)    BMET BMP Latest Ref Rng & Units 07/18/2020 07/17/2020 07/17/2020  Glucose 70 - 99 mg/dL 113(H) - 113(H)  BUN 8 - 23 mg/dL 22 - 21  Creatinine 0.44 - 1.00 mg/dL 0.66 0.74 0.87  BUN/Creat Ratio 12 - 28 - - -  Sodium 135 - 145 mmol/L 139 - 139  Potassium 3.5 - 5.1 mmol/L 3.6 - 3.6  Chloride 98 - 111 mmol/L 105 - 103  CO2 22 - 32 mmol/L 26 - 28  Calcium 8.9 - 10.3 mg/dL 9.7 - 10.2    LFT Hepatic Function Latest Ref Rng  & Units 07/17/2020 07/13/2020 06/04/2020  Total Protein 6.5 - 8.1 g/dL 6.5 6.5 7.2  Albumin 3.5 - 5.0 g/dL 3.9 3.9 4.2  AST 15 - 41 U/L 21 37 22  ALT 0 - 44 U/L _0 Alk Phosphatase 38 - 126 U/L 66 63 58  Total Bilirubin 0.3 - 1.2 mg/dL 0.9 1.6(H) 1.0     STUDIES: CT Abdomen Pelvis W Contrast  Result Date: 07/17/2020 CLINICAL DATA:  Abdominal pain EXAM: CT ABDOMEN AND PELVIS WITH CONTRAST TECHNIQUE: Multidetector CT imaging of the abdomen and pelvis was performed using the standard protocol following bolus administration of intravenous contrast. CONTRAST:  88m OMNIPAQUE IOHEXOL 300 MG/ML  SOLN COMPARISON:  07/13/2020 FINDINGS: Lower chest: Left basilar scarring or atelectasis. Coronary artery calcification. Hepatobiliary: No focal liver abnormality is seen. No gallstones, gallbladder wall thickening, or biliary dilatation. Pancreas: Unremarkable. No pancreatic ductal dilatation or surrounding inflammatory changes. Spleen: Mild splenomegaly. Unchanged subcentimeter hypodensities within the spleen. Adrenals/Urinary Tract: Unremarkable adrenal glands. Stable bilateral renal cysts. Suspicious renal lesion. No stone or hydronephrosis. Urinary bladder is largely obscured by streak artifact within the pelvis. Stomach/Bowel: Progressive pancolonic wall thickening most pronounced within the transverse colon (series 2, images 38-48). Scattered diverticulosis, most pronounced within the sigmoid colon. No dilated loops of bowel to suggest obstruction. Stomach appears within normal limits. Vascular/Lymphatic: Extensive atherosclerosis throughout the aortoiliac axis. No aneurysm. No large vessel occlusion evident on portal venous phase imaging. No abdominopelvic lymphadenopathy. Reproductive: No definite abnormality. Intrapelvic structures largely obscured by streak artifact. Other: No free fluid. No abdominopelvic fluid collection. No pneumoperitoneum. No abdominal wall hernia. Musculoskeletal: Previous right hip  arthroplasty. Thoracolumbar dextroscoliotic curvature. Advanced thoracolumbar degenerative disc disease and degenerative facet  arthropathy. No new or acute osseous findings. IMPRESSION: 1. Progressive pancolonic wall thickening most pronounced within the transverse colon, consistent with a nonspecific infectious or inflammatory colitis. 2. Sigmoid diverticulosis. 3. Mild splenomegaly. 4. Aortic atherosclerosis (ICD10-I70.0). Electronically Signed   By: Davina Poke D.O.   On: 07/17/2020 14:11   ECHOCARDIOGRAM LIMITED  Result Date: 07/18/2020    ECHOCARDIOGRAM LIMITED REPORT   Patient Name:   MELYSA SCHROYER Date of Exam: 07/17/2020 Medical Rec #:  [id number]    Height:       62.0 in Accession #:    [id number]   Weight:       110.0 lb Date of Birth:  [birth year]   BSA:          1.483 m Patient Age:    82 years     BP:           168/77 mmHg Patient Gender: F            HR:           91 bpm. Exam Location:  [facility] Procedure: 2D Echo, Cardiac Doppler and Color Doppler Indications:     Elevated troponin  History:         Patient has prior history of Echocardiogram examinations, most                  recent 09/14/2017. Risk Factors:Hypertension and Dyslipidemia.                  Stroke. Sleep apnea. Ischemic cardiomyopathy. Hypothyroidism.                  Coronary artery disease. Chronic systolic heart failure.                  NSTEMI.  Sonographer:     Wilford Sports Rodgers-Jones Referring Phys:  [id number] [place] Diagnosing Phys: Kate Sable MD IMPRESSIONS  1. Left ventricular ejection fraction, by estimation, is 40 to 45%. The left ventricle has mild to moderately decreased function. The left ventricle demonstrates regional wall motion abnormalities (see scoring diagram/findings for description). There is  mild left ventricular hypertrophy. Left ventricular diastolic parameters are consistent with Grade I diastolic dysfunction (impaired relaxation). There is severe hypokinesis of the left ventricular, basal-mid  inferolateral wall and inferior wall.  2. Mild mitral valve regurgitation.  3. The aortic valve was not well visualized. Aortic valve regurgitation is mild. Mild aortic valve sclerosis is present, with no evidence of aortic valve stenosis. FINDINGS  Left Ventricle: Left ventricular ejection fraction, by estimation, is 40 to 45%. The left ventricle has mild to moderately decreased function. The left ventricle demonstrates regional wall motion abnormalities. Severe hypokinesis of the left ventricular, basal-mid inferolateral wall and inferior wall. There is mild left ventricular hypertrophy. Left ventricular diastolic parameters are consistent with Grade I diastolic dysfunction (impaired relaxation). Left Atrium: Left atrial size was normal in size. Right Atrium: Right atrial size was normal in size. Mitral Valve: Mild mitral valve regurgitation. Aortic Valve: The aortic valve was not well visualized. Aortic valve regurgitation is mild. Mild aortic valve sclerosis is present, with no evidence of aortic valve stenosis. Aortic valve mean gradient measures 7.0 mmHg. Aortic valve peak gradient measures 11.9 mmHg. Aortic valve area, by VTI measures 1.79 cm. LEFT VENTRICLE PLAX 2D LVIDd:         4.29 cm  Diastology LVIDs:         3.50 cm  LV e' medial:  5.33 cm/s LV PW:         1.20 cm  LV E/e' medial:  8.3 LV IVS:        1.27 cm  LV e' lateral:   7.83 cm/s LVOT diam:     1.90 cm  LV E/e' lateral: 5.6 LV SV:         55 LV SV Index:   37 LVOT Area:     2.84 cm  RIGHT VENTRICLE RV Basal diam:  3.32 cm RV S prime:     13.40 cm/s TAPSE (M-mode): 1.7 cm LEFT ATRIUM             Index       RIGHT ATRIUM           Index LA diam:        4.30 cm 2.90 cm/m  RA Area:     11.20 cm LA Vol (A2C):   44.0 ml 29.67 ml/m RA Volume:   26.40 ml  17.80 ml/m LA Vol (A4C):   39.8 ml 26.84 ml/m LA Biplane Vol: 43.5 ml 29.34 ml/m  AORTIC VALVE AV Area (Vmax):    1.69 cm AV Area (Vmean):   1.59 cm AV Area (VTI):     1.79 cm AV Vmax:            172.33 cm/s AV Vmean:          127.333 cm/s AV VTI:            0.306 m AV Peak Grad:      11.9 mmHg AV Mean Grad:      7.0 mmHg LVOT Vmax:         103.00 cm/s LVOT Vmean:        71.400 cm/s LVOT VTI:          0.193 m LVOT/AV VTI ratio: 0.63  AORTA Ao Root diam: 3.00 cm MITRAL VALVE                TRICUSPID VALVE MV Area (PHT): 2.80 cm     TR Peak grad:   16.2 mmHg MV Decel Time: 271 msec     TR Vmax:        201.00 cm/s MV E velocity: 44.10 cm/s MV A velocity: 103.00 cm/s  SHUNTS MV E/A ratio:  0.43         Systemic VTI:  0.19 m                             Systemic Diam: 1.90 cm Kate Sable MD Electronically signed by Kate Sable MD Signature Date/Time: 07/18/2020/7:20:35 AM    Final       Impression / Plan:   EDYE HAINLINE is a 87 y.o. female with history of coronary artery disease, EF of 45 to 50%, atherosclerosis who is admitted with abdominal pain, nausea and vomiting, CT revealed colitis predominantly in the transverse colon.  Infectious etiology was ruled out.  Patient is currently on.  Most likely ischemic colitis.  CT abdomen and pelvis with contrast did not reveal any mesenteric vascular narrowing.  Given recurrent admission, recommend colonoscopy to rule out any other alternative pathology  Clear liquid diet Bowel prep ordered N.p.o. effective 5 AM Tomorrow Tentative plan for colonoscopy with pediatric colonoscope tomorrow  I have discussed alternative options, risks & benefits,  which include, but are not limited to, bleeding, infection, perforation,respiratory complication & drug reaction.  The patient agrees with  this plan & written consent will be obtained.     Thank you for involving me in the care of this patient.      LOS: 0 days   Sherri Sear, MD  07/18/2020, 2:44 PM   Note: This dictation was prepared with Dragon dictation along with smaller phrase technology. Any transcriptional errors that result from this process are unintentional.

## 2020-07-18 NOTE — Progress Notes (Signed)
PROGRESS NOTE    Teresa Franklin  DJM:[id number] DOB: [birth year] DOA: 07/17/2020 PCP: [name], MD    Chief Complaint  Patient presents with  . Abdominal Pain    Brief Narrative:  Teresa Franklin is a 87 y.o. female with medical history significant for coronary artery disease, hypertension, chronic anxiety, hypothyroidism, CVA, presents with recurrent lower abdominal pain  Subjective:  No n/v, no diarrhea, no fever, tolerated breakfast continue to have ab pain Son at bedside She wants me to update his HPOA ( mr [place])  Assessment & Plan:   Active Problems:   Colitis  Progressive colitis: -Was in the hospital from 4/23-4/26, C. difficile testing negative, GI panel negative last hospitalization, general surgery consulted during last admission, it did not appear she received antibiotics, there was a concern of ischemic colitis, she was eventually discharged with improvement of symptoms and outpatient vascular surgery follow-up -However she returned to the hospital with continued abdominal pain, CT abdomen showed " Progressive pancolonic wall thickening most pronounced within the transverse colon, consistent with a nonspecific infectious or inflammatory colitis." -She is hemodynamically stable, no fever, no leukocytosis, she does not appear septic -She is started on Unasyn since admission, will continue for now -consult GI for possible colonoscopy  Mild elevation of troponin -High-sensitivity troponin mild and flat, not consistent with ACS, likely demand ischemia  -Denies chest pain -EKG no interval changes compared to one from 4/23, no acute ST-T changes -Echocardiogram left ventricular EF 40-45 %, ( improved from 2019)   CAD reported history of stent placement, chronic systolic CHF Euvolemic to dry on exam, repeat echo EF 40 to 45% -Continue home regimen, aspirin, Coreg, lisinopril -There is no history of atorvastatin allergy Follow-up with cardiology  outpatient  Hypertension Continue Coreg and lisinopril  ITP/thrombocytopenia Platelet appears at baseline  Hypothyroidism Continue Synthroid Nutritional Assessment:  The patient's BMI is: Body mass index is 20.12 kg/m.Marland Kitchen    Unresulted Labs (From admission, onward)          Start     Ordered   07/24/20 0500  Creatinine, serum  (enoxaparin (LOVENOX)    CrCl >/= 30 ml/min)  Weekly,   STAT     Comments: while on enoxaparin therapy    07/17/20 1502   07/19/20 0500  CBC  Tomorrow morning,   R        07/18/20 1406   07/19/20 2229  Basic metabolic panel  Tomorrow morning,   R        07/18/20 1406   07/19/20 0500  Magnesium  Tomorrow morning,   R        07/18/20 1406   07/19/20 0500  ESR  Tomorrow morning,   R        07/18/20 1406   07/19/20 0500  C-reactive protein  Tomorrow morning,   R        07/18/20 1406   07/18/20 0758  C Difficile Quick Screen w PCR reflex  (C Difficile quick screen w PCR reflex panel)  Once, for 24 hours,   TIMED       References:    CDiff Information Tool   07/18/20 0757            DVT prophylaxis: enoxaparin (LOVENOX) injection 40 mg Start: 07/17/20 2200 SCDs Start: 07/17/20 1502   Code Status:DNR Family Communication: son at bedside, [place] over the phone Disposition:   Status is: Observation   Dispo: The patient is from: home  Anticipated d/c is to: home              Anticipated d/c date is: TBD, to have colonoscopy tomorrow                Consultants:   GI  Procedures:   Colonoscopy on 4/29  Antimicrobials:   Anti-infectives (From admission, onward)   Start     Dose/Rate Route Frequency Ordered Stop   07/17/20 2000  Ampicillin-Sulbactam (UNASYN) 3 g in sodium chloride 0.9 % 100 mL IVPB        3 g 200 mL/hr over 30 Minutes Intravenous Every 6 hours 07/17/20 1547     07/17/20 1500  Ampicillin-Sulbactam (UNASYN) 3 g in sodium chloride 0.9 % 100 mL IVPB        3 g 200 mL/hr over 30 Minutes Intravenous  Once  07/17/20 1452 07/17/20 1612          Objective: Vitals:   07/17/20 2025 07/17/20 2250 07/18/20 0426 07/18/20 0814  BP: (!) 135/59 (!) 129/58 (!) 158/80 (!) 176/100  Pulse: 67 75 73 86  Resp: _0 Temp: 98.4 F (36.9 C) 98.1 F (36.7 C) 98.3 F (36.8 C) 97.9 F (36.6 C)  TempSrc: Oral Oral Oral   SpO2: 98% 97% 99% 100%  Weight:      Height:        Intake/Output Summary (Last 24 hours) at 07/18/2020 1413 Last data filed at 07/18/2020 0958 Gross per 24 hour  Intake 606.35 ml  Output --  Net 606.35 ml   Filed Weights   07/17/20 1046  Weight: 49.9 kg    Examination:  General exam: calm, NAD Respiratory system: Clear to auscultation. Respiratory effort normal. Cardiovascular system: S1 & S2 heard, RRR. No JVD, no murmur, No pedal edema. Gastrointestinal system: diffuse mild tender, no guarding ,  Normal bowel sounds heard. Central nervous system: Alert and oriented. No focal neurological deficits. Extremities: Symmetric 5 x 5 power. Skin: No rashes, lesions or ulcers Psychiatry: Judgement and insight appear normal. Mood & affect appropriate.     Data Reviewed: I have personally reviewed following labs and imaging studies  CBC: Recent Labs  Lab 07/13/20 2045 07/14/20 0223 07/15/20 0336 07/17/20 1049 07/18/20 0333  WBC 10.0 9.1 4.0 5.0 4.9  NEUTROABS 8.8*  --   --  3.6  --   HGB 15.0 13.8 11.6* 14.2 12.0  HCT 43.4 40.3 34.1* 41.0 35.2*  MCV 92.9 92.9 92.7 92.8 94.9  PLT 103* 86* 71* 100* 98*    Basic Metabolic Panel: Recent Labs  Lab 07/13/20 2045 07/14/20 0223 07/17/20 1049 07/17/20 1502 07/18/20 0333  NA 139 141 139  --  139  K 3.4* 4.8 3.6  --  3.6  CL 104 106 103  --  105  CO2 _1 --  26  GLUCOSE 160* 137* 113*  --  113*  BUN _2 --  22  CREATININE 0.91 0.82 0.87 0.74 0.66  CALCIUM 9.7 9.3 10.2  --  9.7    GFR: Estimated Creatinine Clearance: 39 mL/min (by C-G formula based on SCr of 0.66 mg/dL).  Liver Function  Tests: Recent Labs  Lab 07/13/20 2045 07/17/20 1049  AST 37 21  ALT 18 14  ALKPHOS 63 66  BILITOT 1.6* 0.9  PROT 6.5 6.5  ALBUMIN 3.9 3.9    CBG: No results for input(s): GLUCAP in the last 168 hours.   Recent Results (from the  past 240 hour(s))  Gastrointestinal Panel by PCR , Stool     Status: None   Collection Time: 07/13/20 11:42 PM   Specimen: Stool  Result Value Ref Range Status   Campylobacter species NOT DETECTED NOT DETECTED Final   Plesimonas shigelloides NOT DETECTED NOT DETECTED Final   Salmonella species NOT DETECTED NOT DETECTED Final   Yersinia enterocolitica NOT DETECTED NOT DETECTED Final   Vibrio species NOT DETECTED NOT DETECTED Final   Vibrio cholerae NOT DETECTED NOT DETECTED Final   Enteroaggregative E coli (EAEC) NOT DETECTED NOT DETECTED Final   Enteropathogenic E coli (EPEC) NOT DETECTED NOT DETECTED Final   Enterotoxigenic E coli (ETEC) NOT DETECTED NOT DETECTED Final   Shiga like toxin producing E coli (STEC) NOT DETECTED NOT DETECTED Final   Shigella/Enteroinvasive E coli (EIEC) NOT DETECTED NOT DETECTED Final   Cryptosporidium NOT DETECTED NOT DETECTED Final   Cyclospora cayetanensis NOT DETECTED NOT DETECTED Final   Entamoeba histolytica NOT DETECTED NOT DETECTED Final   Giardia lamblia NOT DETECTED NOT DETECTED Final   Adenovirus F40/41 NOT DETECTED NOT DETECTED Final   Astrovirus NOT DETECTED NOT DETECTED Final   Norovirus GI/GII NOT DETECTED NOT DETECTED Final   Rotavirus A NOT DETECTED NOT DETECTED Final   Sapovirus (I, II, IV, and V) NOT DETECTED NOT DETECTED Final    Comment: Performed at [facility], [address] [place], [place] [address]  C Difficile Quick Screen w PCR reflex     Status: None   Collection Time: 07/13/20 11:42 PM   Specimen: Stool  Result Value Ref Range Status   C Diff antigen NEGATIVE NEGATIVE Final   C Diff toxin NEGATIVE NEGATIVE Final   C Diff interpretation No C. difficile detected.  Final     Comment: Performed at [facility], [address] [place], [place] [address]  Resp Panel by RT-PCR (Flu A&B, Covid)     Status: None   Collection Time: 07/13/20 11:48 PM   Specimen: Nasopharyngeal(NP) swabs in vial transport medium  Result Value Ref Range Status   SARS Coronavirus 2 by RT PCR NEGATIVE NEGATIVE Final    Comment: (NOTE) SARS-CoV-2 target nucleic acids are NOT DETECTED.  The SARS-CoV-2 RNA is generally detectable in upper respiratory specimens during the acute phase of infection. The lowest concentration of SARS-CoV-2 viral copies this assay can detect is 138 copies/mL. A negative result does not preclude SARS-Cov-2 infection and should not be used as the sole basis for treatment or other patient management decisions. A negative result may occur with  improper specimen collection/handling, submission of specimen other than nasopharyngeal swab, presence of viral mutation(s) within the areas targeted by this assay, and inadequate number of viral copies(<138 copies/mL). A negative result must be combined with clinical observations, patient history, and epidemiological information. The expected result is Negative.  Fact Sheet for Patients:  EntrepreneurPulse.com.au  Fact Sheet for Healthcare Providers:  IncredibleEmployment.be  This test is no t yet approved or cleared by the [place] FDA and  has been authorized for detection and/or diagnosis of SARS-CoV-2 by FDA under an Emergency Use Authorization (EUA). This EUA will remain  in effect (meaning this test can be used) for the duration of the COVID-19 declaration under Section 564(b)(1) of the Act, 21 U.S.C.section 360bbb-3(b)(1), unless the authorization is terminated  or revoked sooner.       Influenza A by PCR NEGATIVE NEGATIVE Final   Influenza B by PCR NEGATIVE NEGATIVE Final    Comment: (  NOTE) The Xpert Xpress SARS-CoV-2/FLU/RSV plus assay is intended as an  aid in the diagnosis of influenza from Nasopharyngeal swab specimens and should not be used as a sole basis for treatment. Nasal washings and aspirates are unacceptable for Xpert Xpress SARS-CoV-2/FLU/RSV testing.  Fact Sheet for Patients: EntrepreneurPulse.com.au  Fact Sheet for Healthcare Providers: IncredibleEmployment.be  This test is not yet approved or cleared by the [place] FDA and has been authorized for detection and/or diagnosis of SARS-CoV-2 by FDA under an Emergency Use Authorization (EUA). This EUA will remain in effect (meaning this test can be used) for the duration of the COVID-19 declaration under Section 564(b)(1) of the Act, 21 U.S.C. section 360bbb-3(b)(1), unless the authorization is terminated or revoked.  Performed at [facility], [address] [place], [place] [address]   Resp Panel by RT-PCR (Flu A&B, Covid) Nasopharyngeal Swab     Status: None   Collection Time: 07/17/20  2:44 PM   Specimen: Nasopharyngeal Swab; Nasopharyngeal(NP) swabs in vial transport medium  Result Value Ref Range Status   SARS Coronavirus 2 by RT PCR NEGATIVE NEGATIVE Final    Comment: (NOTE) SARS-CoV-2 target nucleic acids are NOT DETECTED.  The SARS-CoV-2 RNA is generally detectable in upper respiratory specimens during the acute phase of infection. The lowest concentration of SARS-CoV-2 viral copies this assay can detect is 138 copies/mL. A negative result does not preclude SARS-Cov-2 infection and should not be used as the sole basis for treatment or other patient management decisions. A negative result may occur with  improper specimen collection/handling, submission of specimen other than nasopharyngeal swab, presence of viral mutation(s) within the areas targeted by this assay, and inadequate number of viral copies(<138 copies/mL). A negative result must be combined with clinical observations, patient history, and  epidemiological information. The expected result is Negative.  Fact Sheet for Patients:  EntrepreneurPulse.com.au  Fact Sheet for Healthcare Providers:  IncredibleEmployment.be  This test is no t yet approved or cleared by the [place] FDA and  has been authorized for detection and/or diagnosis of SARS-CoV-2 by FDA under an Emergency Use Authorization (EUA). This EUA will remain  in effect (meaning this test can be used) for the duration of the COVID-19 declaration under Section 564(b)(1) of the Act, 21 U.S.C.section 360bbb-3(b)(1), unless the authorization is terminated  or revoked sooner.       Influenza A by PCR NEGATIVE NEGATIVE Final   Influenza B by PCR NEGATIVE NEGATIVE Final    Comment: (NOTE) The Xpert Xpress SARS-CoV-2/FLU/RSV plus assay is intended as an aid in the diagnosis of influenza from Nasopharyngeal swab specimens and should not be used as a sole basis for treatment. Nasal washings and aspirates are unacceptable for Xpert Xpress SARS-CoV-2/FLU/RSV testing.  Fact Sheet for Patients: EntrepreneurPulse.com.au  Fact Sheet for Healthcare Providers: IncredibleEmployment.be  This test is not yet approved or cleared by the [place] FDA and has been authorized for detection and/or diagnosis of SARS-CoV-2 by FDA under an Emergency Use Authorization (EUA). This EUA will remain in effect (meaning this test can be used) for the duration of the COVID-19 declaration under Section 564(b)(1) of the Act, 21 U.S.C. section 360bbb-3(b)(1), unless the authorization is terminated or revoked.  Performed at [facility], [address], [place], [place] [address]          Radiology Studies: CT Abdomen Pelvis W Contrast  Result Date: 07/17/2020 CLINICAL DATA:  Abdominal pain EXAM: CT ABDOMEN AND PELVIS WITH CONTRAST TECHNIQUE: Multidetector CT imaging of  the abdomen and pelvis was  performed using the standard protocol following bolus administration of intravenous contrast. CONTRAST:  70mL OMNIPAQUE IOHEXOL 300 MG/ML  SOLN COMPARISON:  07/13/2020 FINDINGS: Lower chest: Left basilar scarring or atelectasis. Coronary artery calcification. Hepatobiliary: No focal liver abnormality is seen. No gallstones, gallbladder wall thickening, or biliary dilatation. Pancreas: Unremarkable. No pancreatic ductal dilatation or surrounding inflammatory changes. Spleen: Mild splenomegaly. Unchanged subcentimeter hypodensities within the spleen. Adrenals/Urinary Tract: Unremarkable adrenal glands. Stable bilateral renal cysts. Suspicious renal lesion. No stone or hydronephrosis. Urinary bladder is largely obscured by streak artifact within the pelvis. Stomach/Bowel: Progressive pancolonic wall thickening most pronounced within the transverse colon (series 2, images 38-48). Scattered diverticulosis, most pronounced within the sigmoid colon. No dilated loops of bowel to suggest obstruction. Stomach appears within normal limits. Vascular/Lymphatic: Extensive atherosclerosis throughout the aortoiliac axis. No aneurysm. No large vessel occlusion evident on portal venous phase imaging. No abdominopelvic lymphadenopathy. Reproductive: No definite abnormality. Intrapelvic structures largely obscured by streak artifact. Other: No free fluid. No abdominopelvic fluid collection. No pneumoperitoneum. No abdominal wall hernia. Musculoskeletal: Previous right hip arthroplasty. Thoracolumbar dextroscoliotic curvature. Advanced thoracolumbar degenerative disc disease and degenerative facet arthropathy. No new or acute osseous findings. IMPRESSION: 1. Progressive pancolonic wall thickening most pronounced within the transverse colon, consistent with a nonspecific infectious or inflammatory colitis. 2. Sigmoid diverticulosis. 3. Mild splenomegaly. 4. Aortic atherosclerosis (ICD10-I70.0). Electronically Signed   By: Davina Poke D.O.   On: 07/17/2020 14:11   ECHOCARDIOGRAM LIMITED  Result Date: 07/18/2020    ECHOCARDIOGRAM LIMITED REPORT   Patient Name:   MATRACA HUNKINS Date of Exam: 07/17/2020 Medical Rec #:  [id number]    Height:       62.0 in Accession #:    [id number]   Weight:       110.0 lb Date of Birth:  02-03-1931   BSA:          1.483 m Patient Age:    38 years     BP:           168/77 mmHg Patient Gender: F            HR:           91 bpm. Exam Location:  [facility] Procedure: 2D Echo, Cardiac Doppler and Color Doppler Indications:     Elevated troponin  History:         Patient has prior history of Echocardiogram examinations, most                  recent 09/14/2017. Risk Factors:Hypertension and Dyslipidemia.                  Stroke. Sleep apnea. Ischemic cardiomyopathy. Hypothyroidism.                  Coronary artery disease. Chronic systolic heart failure.                  NSTEMI.  Sonographer:     Wilford Sports Rodgers-Jones Referring Phys:  [id number] [place] Diagnosing Phys: Kate Sable MD IMPRESSIONS  1. Left ventricular ejection fraction, by estimation, is 40 to 45%. The left ventricle has mild to moderately decreased function. The left ventricle demonstrates regional wall motion abnormalities (see scoring diagram/findings for description). There is  mild left ventricular hypertrophy. Left ventricular diastolic parameters are consistent with Grade I diastolic dysfunction (impaired relaxation). There is severe hypokinesis of the left ventricular, basal-mid inferolateral wall and inferior wall.  2. Mild  mitral valve regurgitation.  3. The aortic valve was not well visualized. Aortic valve regurgitation is mild. Mild aortic valve sclerosis is present, with no evidence of aortic valve stenosis. FINDINGS  Left Ventricle: Left ventricular ejection fraction, by estimation, is 40 to 45%. The left ventricle has mild to moderately decreased function. The left ventricle demonstrates regional wall motion abnormalities. Severe  hypokinesis of the left ventricular, basal-mid inferolateral wall and inferior wall. There is mild left ventricular hypertrophy. Left ventricular diastolic parameters are consistent with Grade I diastolic dysfunction (impaired relaxation). Left Atrium: Left atrial size was normal in size. Right Atrium: Right atrial size was normal in size. Mitral Valve: Mild mitral valve regurgitation. Aortic Valve: The aortic valve was not well visualized. Aortic valve regurgitation is mild. Mild aortic valve sclerosis is present, with no evidence of aortic valve stenosis. Aortic valve mean gradient measures 7.0 mmHg. Aortic valve peak gradient measures 11.9 mmHg. Aortic valve area, by VTI measures 1.79 cm. LEFT VENTRICLE PLAX 2D LVIDd:         4.29 cm  Diastology LVIDs:         3.50 cm  LV e' medial:    5.33 cm/s LV PW:         1.20 cm  LV E/e' medial:  8.3 LV IVS:        1.27 cm  LV e' lateral:   7.83 cm/s LVOT diam:     1.90 cm  LV E/e' lateral: 5.6 LV SV:         55 LV SV Index:   37 LVOT Area:     2.84 cm  RIGHT VENTRICLE RV Basal diam:  3.32 cm RV S prime:     13.40 cm/s TAPSE (M-mode): 1.7 cm LEFT ATRIUM             Index       RIGHT ATRIUM           Index LA diam:        4.30 cm 2.90 cm/m  RA Area:     11.20 cm LA Vol (A2C):   44.0 ml 29.67 ml/m RA Volume:   26.40 ml  17.80 ml/m LA Vol (A4C):   39.8 ml 26.84 ml/m LA Biplane Vol: 43.5 ml 29.34 ml/m  AORTIC VALVE AV Area (Vmax):    1.69 cm AV Area (Vmean):   1.59 cm AV Area (VTI):     1.79 cm AV Vmax:           172.33 cm/s AV Vmean:          127.333 cm/s AV VTI:            0.306 m AV Peak Grad:      11.9 mmHg AV Mean Grad:      7.0 mmHg LVOT Vmax:         103.00 cm/s LVOT Vmean:        71.400 cm/s LVOT VTI:          0.193 m LVOT/AV VTI ratio: 0.63  AORTA Ao Root diam: 3.00 cm MITRAL VALVE                TRICUSPID VALVE MV Area (PHT): 2.80 cm     TR Peak grad:   16.2 mmHg MV Decel Time: 271 msec     TR Vmax:        201.00 cm/s MV E velocity: 44.10 cm/s MV A  velocity: 103.00 cm/s  SHUNTS MV E/A ratio:  0.43  Systemic VTI:  0.19 m                             Systemic Diam: 1.90 cm Kate Sable MD Electronically signed by Kate Sable MD Signature Date/Time: 07/18/2020/7:20:35 AM    Final         Scheduled Meds: . aspirin  81 mg Oral Daily  . B-complex with vitamin C  1 tablet Oral Daily  . carvedilol  3.125 mg Oral BID WC  . enoxaparin (LOVENOX) injection  40 mg Subcutaneous Q24H  . levothyroxine  25 mcg Oral QAC breakfast  . lisinopril  20 mg Oral Daily  . multivitamin with minerals  1 tablet Oral QHS  . oxybutynin  5 mg Oral BID  . pantoprazole  40 mg Oral Daily  . polyethylene glycol powder  1 Container Oral Once   Continuous Infusions: . ampicillin-sulbactam (UNASYN) IV 3 g (07/18/20 0949)     LOS: 0 days   Time spent: 28mns Greater than 50% of this time was spent in counseling, explanation of diagnosis, planning of further management, and coordination of care.   Voice Recognition /Viviann Sparedictation system was used to create this note, attempts have been made to correct errors. Please contact the author with questions and/or clarifications.   FFlorencia Reasons MD PhD FACP [facility]  Available via Epic secure chat 7am-7pm for nonurgent issues Please page for urgent issues To page the attending provider between 7A-7P or the covering provider during after hours 7P-7A, please log into the web site [email or web address] and access using universal [place] password for that web site. If you do not have the password, please call the hospital operator.    07/18/2020, 2:13 PM

## 2020-07-18 NOTE — Progress Notes (Signed)
Initial Nutrition Assessment  DOCUMENTATION CODES:   Not applicable  INTERVENTION:   Boost Breeze po TID, each supplement provides 250 kcal and 9 grams of protein  NUTRITION DIAGNOSIS:   Inadequate oral intake related to acute illness as evidenced by per patient/family report.  GOAL:   Patient will meet greater than or equal to 90% of their needs  MONITOR:   PO intake,Supplement acceptance,Labs,Weight trends,Skin,I & O's  REASON FOR ASSESSMENT:   Malnutrition Screening Tool    ASSESSMENT:   87 y.o. female with history of coronary artery artery disease, CHF, HLD, hypertension, hypothyroidism, ITP and CVA who was recently discharged on 4/26 after admission with colitis or returns with abdominal pain.   Met with pt in room today. Pt reports fair appetite and oral intake since her last discharge. Pt reports that she has continued to eat and drink Boost since discharge but reports that she is eating less than normal. Pt reports eating 50% of her breakfast in hospital this morning. Pt has now been downgraded to a clear liquid diet for planned colonoscopy tomorrow. Pt would like to have Ensure once her diet advances; will order Boost Breeze for now as pt on clear liquids. RD will monitor for diet advancement. Per chart, pt appears weight stable at baseline.   Medications reviewed and include: aspirin, B-complex with C, lovenox, synthroid, MVI, protonix, unasyn   Labs reviewed:   NUTRITION - FOCUSED PHYSICAL EXAM:  Flowsheet Row Most Recent Value  Orbital Region No depletion  Upper Arm Region Mild depletion  Thoracic and Lumbar Region No depletion  Buccal Region No depletion  Temple Region Mild depletion  Clavicle Bone Region Mild depletion  Clavicle and Acromion Bone Region Mild depletion  Scapular Bone Region No depletion  Dorsal Hand Mild depletion  Patellar Region Mild depletion  Anterior Thigh Region No depletion  Posterior Calf Region No depletion  Edema (RD  Assessment) None  Hair Reviewed  Eyes Reviewed  Mouth Reviewed  Skin Reviewed  Nails Reviewed     Diet Order:   Diet Order            Diet NPO time specified Except for: Ice Chips, Sips with Meds  Diet effective 0500 tomorrow           Diet clear liquid Room service appropriate? Yes; Fluid consistency: Thin  Diet effective now                EDUCATION NEEDS:   Education needs have been addressed  Skin:  Skin Assessment: Reviewed RN Assessment (ecchymosis)  Last BM:  4/27  Height:   Ht Readings from Last 1 Encounters:  07/17/20 '5\' 2"'  (1.575 m)    Weight:   Wt Readings from Last 1 Encounters:  07/17/20 49.9 kg    Ideal Body Weight:  50 kg  BMI:  Body mass index is 20.12 kg/m.  Estimated Nutritional Needs:   Kcal:  1300-1500kcal/day  Protein:  65-75g/day  Fluid:  1.3-1.5L/day  Koleen Distance MS, RD, LDN Please refer to [facility] for RD and/or RD on-call/weekend/after hours pager

## 2020-07-18 NOTE — Progress Notes (Signed)
[facility]) Hospital Liaison note:  This is a pending outpatient-based Palliative Care patient. Will continue to follow for disposition.  Please call with any outpatient palliative questions or concerns.  Thank you, Lorelee Market, LPN [facility] Liaison [phone]

## 2020-07-18 NOTE — Plan of Care (Signed)

## 2020-07-18 NOTE — Evaluation (Signed)
Occupational Therapy Evaluation Patient Details Name: Teresa Franklin MRN: [id number] DOB: [birth year] Today's Date: 07/18/2020    History of Present Illness Teresa Franklin is an 61yoF who comes to [facility] on 4/27 with lower ABD pain, recently DC from here on 4/26 for same. PMH: CAD, HTN, GAD, hypoTSH, CVA. Pt admitted for suspected collitis. Pt seen by surgery services on 4/24 who reported high suspicioun for gatroenteritis s/p [employer].   Clinical Impression   Teresa Franklin was seen for OT evaluation this date. Prior to hospital admission, pt was Independent in all I/ADLs, including driving to grocery store. Pt lives alone in 1 story home with family/friends available PRN. Pt required SUPERVISION for toilet t/f and perihygiene in sitting - assist for IV pole mgmt. SUPERVISION hand washing and grooming standing sinkside. MOD I for LB access seated EOB. Pt demonstrates near baseline independence to perform ADL and mobility tasks. No skilled OT needs identified. Will sign off. Please re-consult if additional OT needs arise.     Follow Up Recommendations  No OT follow up    Equipment Recommendations  None recommended by OT    Recommendations for Other Services       Precautions / Restrictions Precautions Precautions: None Restrictions Weight Bearing Restrictions: No      Mobility Bed Mobility Overal bed mobility: Independent                  Transfers Overall transfer level: Independent Equipment used: None                  Balance Overall balance assessment: Independent                                         ADL either performed or assessed with clinical judgement   ADL Overall ADL's : Needs assistance/impaired                                       General ADL Comments: SUPERVISION for toilet t/f and perihygiene in sitting - assist for IV pole mgmt. SUPERVISION hand washing and grooming standing sinkside. MOD I for LB access  seated EOB                  Pertinent Vitals/Pain Pain Assessment: No/denies pain Pain Score: 0-No pain (non-unless having a  BM.) Pain Location: ABD pain     Hand Dominance Right   Extremity/Trunk Assessment Upper Extremity Assessment Upper Extremity Assessment: Overall WFL for tasks assessed   Lower Extremity Assessment Lower Extremity Assessment: Overall WFL for tasks assessed   Cervical / Trunk Assessment Cervical / Trunk Assessment: Normal   Communication Communication Communication: No difficulties   Cognition Arousal/Alertness: Awake/alert Behavior During Therapy: WFL for tasks assessed/performed Overall Cognitive Status: Within Functional Limits for tasks assessed                                     General Comments       Exercises Exercises: Other exercises Other Exercises Other Exercises: Pt educated re; OT role, DME recs, d/c recs, falls prevention, ECS, home/routines modifications Other Exercises: LBD, toileting, sup<>sit, sit<>standx2, sitting/standing balance/tolerance   Shoulder Instructions      Home Living Family/patient expects to be  discharged to:: Private residence Living Arrangements: Alone Available Help at Discharge: Family;Friend(s);Available PRN/intermittently (friend nearby and 2 sons ~25 min away) Type of Home: House Home Access: Stairs to enter [employer] of Steps: 1   Home Layout: One level               Home Equipment: Walker - 2 wheels;Bedside commode          Prior Functioning/Environment Level of Independence: Independent        Comments: walks daily for exercise, drives [place], groceries.        OT Problem List: Decreased activity tolerance         OT Goals(Current goals can be found in the care plan section) Acute Rehab OT Goals Patient Stated Goal: to go home OT Goal Formulation: With patient/family Time For Goal Achievement: 08/01/20 Potential to Achieve Goals:  Good   AM-PAC OT "6 Clicks" Daily Activity     Outcome Measure Help from another person eating meals?: None Help from another person taking care of personal grooming?: None Help from another person toileting, which includes using toliet, bedpan, or urinal?: None Help from another person bathing (including washing, rinsing, drying)?: A Little Help from another person to put on and taking off regular upper body clothing?: None Help from another person to put on and taking off regular lower body clothing?: None 6 Click Score: 23   End of Session Nurse Communication: Mobility status  Activity Tolerance: Patient tolerated treatment well Patient left: in bed;with call bell/phone within reach;with family/visitor present  OT Visit Diagnosis: Other abnormalities of gait and mobility (R26.89)                Time: 2831-5176 OT Time Calculation (min): 34 min Charges:  OT General Charges $OT Visit: 1 Visit OT Evaluation $OT Eval Low Complexity: 1 Low OT Treatments $Self Care/Home Management : 23-37 mins  Dessie Coma, M.S. OTR/L  07/18/20, 1:12 PM  ascom [phone]

## 2020-07-19 ENCOUNTER — Observation Stay: Payer: Medicare PPO | Admitting: Anesthesiology

## 2020-07-19 ENCOUNTER — Encounter: Payer: Self-pay | Admitting: Internal Medicine

## 2020-07-19 ENCOUNTER — Encounter
Admission: EM | Disposition: A | Discharge status: Home / Self Care | Payer: Self-pay | Source: Home / Self Care | Attending: Emergency Medicine

## 2020-07-19 DIAGNOSIS — K635 Polyp of colon: Secondary | ICD-10-CM | POA: Diagnosis not present

## 2020-07-19 DIAGNOSIS — R109 Unspecified abdominal pain: Secondary | ICD-10-CM | POA: Diagnosis not present

## 2020-07-19 DIAGNOSIS — K529 Noninfective gastroenteritis and colitis, unspecified: Secondary | ICD-10-CM | POA: Diagnosis not present

## 2020-07-19 HISTORY — PX: COLONOSCOPY WITH PROPOFOL: SHX5780

## 2020-07-19 LAB — BASIC METABOLIC PANEL
Anion gap: 9 (ref 5–15)
BUN: 13 mg/dL (ref 8–23)
CO2: 26 mmol/L (ref 22–32)
Calcium: 9.4 mg/dL (ref 8.9–10.3)
Chloride: 106 mmol/L (ref 98–111)
Creatinine, Ser: 0.65 mg/dL (ref 0.44–1.00)
GFR, Estimated: >60 mL/min (ref >60)
Glucose, Bld: 107 mg/dL — ABNORMAL HIGH (ref 70–99)
Potassium: 3.6 mmol/L (ref 3.5–5.1)
Sodium: 141 mmol/L (ref 135–145)

## 2020-07-19 LAB — CBC
HCT: 37.8 % (ref 36.0–46.0)
Hemoglobin: 12.7 g/dL (ref 12.0–15.0)
MCH: 32.6 pg (ref 26.0–34.0)
MCHC: 33.6 g/dL (ref 30.0–36.0)
MCV: 96.9 fL (ref 80.0–100.0)
Platelets: 85 10*3/uL — ABNORMAL LOW (ref 150–400)
RBC: 3.9 MIL/uL (ref 3.87–5.11)
RDW: 13.8 % (ref 11.5–15.5)
WBC: 4.1 10*3/uL (ref 4.0–10.5)
nRBC: 0 % (ref 0.0–0.2)

## 2020-07-19 LAB — SEDIMENTATION RATE: Sed Rate: 12 mm/hr (ref 0–30)

## 2020-07-19 LAB — MAGNESIUM: Magnesium: 1.7 mg/dL (ref 1.7–2.4)

## 2020-07-19 LAB — C-REACTIVE PROTEIN: CRP: <0.5 mg/dL (ref <1.0)

## 2020-07-19 SURGERY — COLONOSCOPY WITH PROPOFOL
Anesthesia: General

## 2020-07-19 MED ORDER — LORATADINE 10 MG PO TABS
10.0000 mg | ORAL_TABLET | Freq: Every day | ORAL | Status: DC | PRN
  Administered 2020-07-19: 10 mg via ORAL
  Filled 2020-07-19: qty 1

## 2020-07-19 MED ORDER — PROPOFOL 10 MG/ML IV BOLUS
INTRAVENOUS | Status: DC | PRN
  Administered 2020-07-19: 50 mg via INTRAVENOUS

## 2020-07-19 MED ORDER — PROPOFOL 500 MG/50ML IV EMUL
INTRAVENOUS | Status: DC | PRN
  Administered 2020-07-19: 100 ug/kg/min via INTRAVENOUS

## 2020-07-19 MED ORDER — LIDOCAINE 2% (20 MG/ML) 5 ML SYRINGE
INTRAMUSCULAR | Status: DC | PRN
  Administered 2020-07-19: 25 mg via INTRAVENOUS

## 2020-07-19 MED ORDER — DICYCLOMINE HCL 10 MG PO CAPS
10.0000 mg | ORAL_CAPSULE | Freq: Three times a day (TID) | ORAL | Status: DC | PRN
  Filled 2020-07-19: qty 1

## 2020-07-19 MED ORDER — SODIUM CHLORIDE 0.9 % IV SOLN: INTRAVENOUS | Status: DC

## 2020-07-19 NOTE — Progress Notes (Signed)
Received a call from pt's POA, [employer]. Per POA, any patient information/decision making that needs to be discussed with both patient and family needs to be relayed to him, and not to pt's sons. He also stated that pt can be confused at times, and if any questions to reach out to him.

## 2020-07-19 NOTE — Plan of Care (Signed)
Continuing with plan of care. 

## 2020-07-19 NOTE — Op Note (Signed)
[facility] Gastroenterology Patient Name: Teresa Franklin Procedure Date: 07/19/2020 12:15 PM MRN: [id number] Account #: [id number] Date of Birth: 18-Mar-1931 Admit Type: Inpatient Age: 87 Room: [facility] ENDO ROOM 4 Gender: Female Note Status: Finalized Procedure:             Colonoscopy Indications:           Lower abdominal pain, Abnormal CT of the GI tract Providers:             Lin Landsman MD, MD Referring MD:          No Local Md, MD (Referring MD) Medicines:             General Anesthesia Complications:         No immediate complications. Estimated blood loss: None. Procedure:             Pre-Anesthesia Assessment:                        - Prior to the procedure, a History and Physical was                         performed, and patient medications and allergies were                         reviewed. The patient is competent. The risks and                         benefits of the procedure and the sedation options and                         risks were discussed with the patient. All questions                         were answered and informed consent was obtained.                         Patient identification and proposed procedure were                         verified by the physician, the nurse, the                         anesthesiologist, the anesthetist and the technician                         in the pre-procedure area in the procedure room in the                         endoscopy suite. Mental Status Examination: alert and                         oriented. Airway Examination: normal oropharyngeal                         airway and neck mobility. Respiratory Examination:                         clear to auscultation. CV Examination: normal.  Prophylactic Antibiotics: The patient does not require                         prophylactic antibiotics. Prior Anticoagulants: The                         patient has taken no previous  anticoagulant or                         antiplatelet agents. ASA Grade Assessment: III - A                         patient with severe systemic disease. After reviewing                         the risks and benefits, the patient was deemed in                         satisfactory condition to undergo the procedure. The                         anesthesia plan was to use general anesthesia.                         Immediately prior to administration of medications,                         the patient was re-assessed for adequacy to receive                         sedatives. The heart rate, respiratory rate, oxygen                         saturations, blood pressure, adequacy of pulmonary                         ventilation, and response to care were monitored                         throughout the procedure. The physical status of the                         patient was re-assessed after the procedure.                        After obtaining informed consent, the colonoscope was                         passed under direct vision. Throughout the procedure,                         the patient's blood pressure, pulse, and oxygen                         saturations were monitored continuously. The was                         introduced through the anus and advanced to the the  terminal ileum, with identification of the appendiceal                         orifice and IC valve. The colonoscopy was performed                         without difficulty. The patient tolerated the                         procedure well. The quality of the bowel preparation                         was evaluated using the BBPS [facility] Bowel Preparation                         Scale) with scores of: Right Colon = 3, Transverse                         Colon = 3 and Left Colon = 3 (entire mucosa seen well                         with no residual staining, small fragments of stool or                          opaque liquid). The total BBPS score equals 9. Findings:      The perianal and digital rectal examinations were normal. Pertinent       negatives include normal sphincter tone and no palpable rectal lesions.      The terminal ileum appeared normal.      Multiple diverticula were found in the sigmoid colon.      Two sessile polyps were found in the descending colon and transverse       colon. The polyps were 4 to 5 mm in size. These polyps were removed with       a cold snare. Resection and retrieval were complete.      A 8 mm polyp was found in the transverse colon. The polyp was sessile.       The polyp was removed with a hot snare. Resection and retrieval were       complete. Estimated blood loss: none.      The retroflexed view of the distal rectum and anal verge was normal and       showed no anal or rectal abnormalities.      Normal mucosa was found in the entire colon. Impression:            - The examined portion of the ileum was normal.                        - Diverticulosis in the sigmoid colon.                        - Two 4 to 5 mm polyps in the descending colon and in                         the transverse colon, removed with a cold snare.  Resected and retrieved.                        - One 8 mm polyp in the transverse colon, removed with                         a hot snare. Resected and retrieved.                        - The distal rectum and anal verge are normal on                         retroflexion view.                        - Normal mucosa in the entire examined colon, no                         evidence of colitis. Recommendation:        - Return patient to hospital ward for possible                         discharge same day.                        - Resume regular diet today.                        - Continue present medications.                        - Await pathology results.                        - D/C antibiotics Procedure  Code(s):     --- Professional ---                        [phone], Colonoscopy, flexible; with removal of                         tumor(s), polyp(s), or other lesion(s) by snare                         technique Diagnosis Code(s):     --- Professional ---                        K63.5, Polyp of colon                        R10.30, Lower abdominal pain, unspecified                        K57.30, Diverticulosis of large intestine without                         perforation or abscess without bleeding                        R93.3, Abnormal findings on diagnostic imaging of  other parts of digestive tract CPT copyright 2019 [employer]. All rights reserved. The codes documented in this report are preliminary and upon coder review may  be revised to meet current compliance requirements. Dr. Ulyess Mort Lin Landsman MD, MD 07/19/2020 1:40:53 PM This report has been signed electronically. Number of Addenda: 0 Note Initiated On: 07/19/2020 12:15 PM Scope Withdrawal Time: 0 hours 13 minutes 49 seconds  Total Procedure Duration: 0 hours 18 minutes 59 seconds  Estimated Blood Loss:  Estimated blood loss: none.      [facility]

## 2020-07-19 NOTE — Progress Notes (Signed)
PROGRESS NOTE    Teresa Franklin  BTD:[id number] DOB: 06-Sep-1930 DOA: 07/17/2020 PCP: [name], MD    Chief Complaint  Patient presents with  . Abdominal Pain    Brief Narrative:  Teresa Franklin is a 87 y.o. female with medical history significant for coronary artery disease, hypertension, chronic anxiety, hypothyroidism, CVA, presents with recurrent lower abdominal pain  Subjective:  Returned from colonoscopy , she is cleared to go home by GI, but  she does not feel comfortable going home today , she wants to eat and make sure ab pain does not come back Son at bedside   She wants me to update his HPOA ( mr Dearing)  Assessment & Plan:   Active Problems:   Colitis  Ab pain: -Was in the hospital from 4/23-4/26, C. difficile testing negative, GI panel negative last hospitalization, general surgery consulted during last admission, did not appear she received antibiotics, there was a concern of ischemic colitis, she was eventually discharged with improvement of symptoms and outpatient vascular surgery follow-up -However she returned to the hospital with continued abdominal pain, CT abdomen showed " Progressive pancolonic wall thickening most pronounced within the transverse colon, consistent with a nonspecific infectious or inflammatory colitis." -She is hemodynamically stable, no fever, no leukocytosis, she does not appear septic -She is started on Unasyn since admission, GI consulted, colonoscopy today did not show any colitis, no need of antibiotics ,GI cleared her to go home  Colon polyps S/p resection, pathology pending -Follow-up with GI   Mild elevation of troponin -High-sensitivity troponin mild and flat, not consistent with ACS, likely demand ischemia  -Denies chest pain -EKG no interval changes compared to one from 4/23, no acute ST-T changes -Echocardiogram left ventricular EF 40-45 %, ( improved from 2019)   CAD reported history of stent placement, chronic  systolic CHF Euvolemic to dry on exam, repeat echo EF 40 to 45% -Continue home regimen, aspirin, Coreg, lisinopril -There is no history of atorvastatin allergy Follow-up with cardiology outpatient  Hypertension Continue Coreg and lisinopril  ITP/thrombocytopenia Platelet appears at baseline  Hypothyroidism Continue Synthroid Nutritional Assessment:  The patient's BMI is: Body mass index is 20.12 kg/m.Marland Kitchen    Unresulted Labs (From admission, onward)          Start     Ordered   07/24/20 0500  Creatinine, serum  (enoxaparin (LOVENOX)    CrCl >/= 30 ml/min)  Weekly,   STAT     Comments: while on enoxaparin therapy    07/17/20 1502   07/20/20 0500  CBC with Differential/Platelet  Tomorrow morning,   R        07/19/20 1543   07/20/20 1062  Basic metabolic panel  Tomorrow morning,   R        07/19/20 1543   07/20/20 0500  Magnesium  Tomorrow morning,   R        07/19/20 1543   07/18/20 0758  C Difficile Quick Screen w PCR reflex  (C Difficile quick screen w PCR reflex panel)  Once, for 24 hours,   TIMED       References:    CDiff Information Tool   07/18/20 0757            DVT prophylaxis: enoxaparin (LOVENOX) injection 40 mg Start: 07/17/20 2200 SCDs Start: 07/17/20 1502   Code Status:DNR Family Communication: son at bedside, [place] over the phone Disposition:   Status is: Observation   Dispo: The patient is from: home  Anticipated d/c is to: home              Anticipated d/c date is: tomorrow                Consultants:   GI Dr Marius Ditch  Procedures:   Colonoscopy on 4/29  Antimicrobials:   Anti-infectives (From admission, onward)   Start     Dose/Rate Route Frequency Ordered Stop   07/17/20 2000  Ampicillin-Sulbactam (UNASYN) 3 g in sodium chloride 0.9 % 100 mL IVPB  Status:  Discontinued        3 g 200 mL/hr over 30 Minutes Intravenous Every 6 hours 07/17/20 1547 07/19/20 1356   07/17/20 1500  Ampicillin-Sulbactam (UNASYN) 3 g in sodium  chloride 0.9 % 100 mL IVPB        3 g 200 mL/hr over 30 Minutes Intravenous  Once 07/17/20 1452 07/17/20 1612          Objective: Vitals:   07/19/20 1257 07/19/20 1343 07/19/20 1353 07/19/20 1403  BP: (!) 169/85 (!) 87/44 (!) 130/56 (!) 148/75  Pulse: 67 71 68 68  Resp: 18 18 (!) 21 16  Temp: (!) 97.3 F (36.3 C)     TempSrc: Temporal     SpO2: 98% 98% 100% 100%  Weight: 49.9 kg     Height: 5\' 2"  (1.575 m)       Intake/Output Summary (Last 24 hours) at 07/19/2020 1545 Last data filed at 07/19/2020 1322 Gross per 24 hour  Intake 200 ml  Output --  Net 200 ml   Filed Weights   07/17/20 1046 07/19/20 1257  Weight: 49.9 kg 49.9 kg    Examination:  General exam: calm, NAD Respiratory system: Clear to auscultation. Respiratory effort normal. Cardiovascular system: S1 & S2 heard, RRR. No JVD, no murmur, No pedal edema. Gastrointestinal system: notender, soft,  no guarding ,  Normal bowel sounds heard. Central nervous system: Alert and oriented. No focal neurological deficits. Extremities: Symmetric 5 x 5 power. Skin: No rashes, lesions or ulcers Psychiatry: Judgement and insight appear normal. Mood & affect appropriate.     Data Reviewed: I have personally reviewed following labs and imaging studies  CBC: Recent Labs  Lab 07/13/20 2045 07/14/20 0223 07/15/20 0336 07/17/20 1049 07/18/20 0333 07/19/20 0413  WBC 10.0 9.1 4.0 5.0 4.9 4.1  NEUTROABS 8.8*  --   --  3.6  --   --   HGB 15.0 13.8 11.6* 14.2 12.0 12.7  HCT 43.4 40.3 34.1* 41.0 35.2* 37.8  MCV 92.9 92.9 92.7 92.8 94.9 96.9  PLT 103* 86* 71* 100* 98* 85*    Basic Metabolic Panel: Recent Labs  Lab 07/13/20 2045 07/14/20 0223 07/17/20 1049 07/17/20 1502 07/18/20 0333 07/19/20 0413  NA 139 141 139  --  139 141  K 3.4* 4.8 3.6  --  3.6 3.6  CL 104 106 103  --  105 106  CO2 26 27 28   --  26 26  GLUCOSE 160* 137* 113*  --  113* 107*  BUN 16 16 21   --  22 13  CREATININE 0.91 0.82 0.87 0.74 0.66  0.65  CALCIUM 9.7 9.3 10.2  --  9.7 9.4  MG  --   --   --   --   --  1.7    GFR: Estimated Creatinine Clearance: 39 mL/min (by C-G formula based on SCr of 0.65 mg/dL).  Liver Function Tests: Recent Labs  Lab 07/13/20 2045 07/17/20 1049  AST 37  21  ALT 18 14  ALKPHOS 63 66  BILITOT 1.6* 0.9  PROT 6.5 6.5  ALBUMIN 3.9 3.9    CBG: No results for input(s): GLUCAP in the last 168 hours.   Recent Results (from the past 240 hour(s))  Gastrointestinal Panel by PCR , Stool     Status: None   Collection Time: 07/13/20 11:42 PM   Specimen: Stool  Result Value Ref Range Status   Campylobacter species NOT DETECTED NOT DETECTED Final   Plesimonas shigelloides NOT DETECTED NOT DETECTED Final   Salmonella species NOT DETECTED NOT DETECTED Final   Yersinia enterocolitica NOT DETECTED NOT DETECTED Final   Vibrio species NOT DETECTED NOT DETECTED Final   Vibrio cholerae NOT DETECTED NOT DETECTED Final   Enteroaggregative E coli (EAEC) NOT DETECTED NOT DETECTED Final   Enteropathogenic E coli (EPEC) NOT DETECTED NOT DETECTED Final   Enterotoxigenic E coli (ETEC) NOT DETECTED NOT DETECTED Final   Shiga like toxin producing E coli (STEC) NOT DETECTED NOT DETECTED Final   Shigella/Enteroinvasive E coli (EIEC) NOT DETECTED NOT DETECTED Final   Cryptosporidium NOT DETECTED NOT DETECTED Final   Cyclospora cayetanensis NOT DETECTED NOT DETECTED Final   Entamoeba histolytica NOT DETECTED NOT DETECTED Final   Giardia lamblia NOT DETECTED NOT DETECTED Final   Adenovirus F40/41 NOT DETECTED NOT DETECTED Final   Astrovirus NOT DETECTED NOT DETECTED Final   Norovirus GI/GII NOT DETECTED NOT DETECTED Final   Rotavirus A NOT DETECTED NOT DETECTED Final   Sapovirus (I, II, IV, and V) NOT DETECTED NOT DETECTED Final    Comment: Performed at [facility], [address] [place], [place] [address]  C Difficile Quick Screen w PCR reflex     Status: None   Collection Time: 07/13/20 11:42 PM    Specimen: Stool  Result Value Ref Range Status   C Diff antigen NEGATIVE NEGATIVE Final   C Diff toxin NEGATIVE NEGATIVE Final   C Diff interpretation No C. difficile detected.  Final    Comment: Performed at [facility], [address] [place], [place] [address]  Resp Panel by RT-PCR (Flu A&B, Covid)     Status: None   Collection Time: 07/13/20 11:48 PM   Specimen: Nasopharyngeal(NP) swabs in vial transport medium  Result Value Ref Range Status   SARS Coronavirus 2 by RT PCR NEGATIVE NEGATIVE Final    Comment: (NOTE) SARS-CoV-2 target nucleic acids are NOT DETECTED.  The SARS-CoV-2 RNA is generally detectable in upper respiratory specimens during the acute phase of infection. The lowest concentration of SARS-CoV-2 viral copies this assay can detect is 138 copies/mL. A negative result does not preclude SARS-Cov-2 infection and should not be used as the sole basis for treatment or other patient management decisions. A negative result may occur with  improper specimen collection/handling, submission of specimen other than nasopharyngeal swab, presence of viral mutation(s) within the areas targeted by this assay, and inadequate number of viral copies(<138 copies/mL). A negative result must be combined with clinical observations, patient history, and epidemiological information. The expected result is Negative.  Fact Sheet for Patients:  EntrepreneurPulse.com.au  Fact Sheet for Healthcare Providers:  IncredibleEmployment.be  This test is no t yet approved or cleared by the [place] FDA and  has been authorized for detection and/or diagnosis of SARS-CoV-2 by FDA under an Emergency Use Authorization (EUA). This EUA will remain  in effect (meaning this test can be used) for the duration of the COVID-19 declaration under Section 564(b)(1)  of the Act, 21 U.S.C.section 360bbb-3(b)(1), unless the authorization is terminated  or revoked  sooner.       Influenza A by PCR NEGATIVE NEGATIVE Final   Influenza B by PCR NEGATIVE NEGATIVE Final    Comment: (NOTE) The Xpert Xpress SARS-CoV-2/FLU/RSV plus assay is intended as an aid in the diagnosis of influenza from Nasopharyngeal swab specimens and should not be used as a sole basis for treatment. Nasal washings and aspirates are unacceptable for Xpert Xpress SARS-CoV-2/FLU/RSV testing.  Fact Sheet for Patients: BloggerCourse.com  Fact Sheet for Healthcare Providers: SeriousBroker.it  This test is not yet approved or cleared by the [place] FDA and has been authorized for detection and/or diagnosis of SARS-CoV-2 by FDA under an Emergency Use Authorization (EUA). This EUA will remain in effect (meaning this test can be used) for the duration of the COVID-19 declaration under Section 564(b)(1) of the Act, 21 U.S.C. section 360bbb-3(b)(1), unless the authorization is terminated or revoked.  Performed at [facility], [address] [place], [place] [address]   Resp Panel by RT-PCR (Flu A&B, Covid) Nasopharyngeal Swab     Status: None   Collection Time: 07/17/20  2:44 PM   Specimen: Nasopharyngeal Swab; Nasopharyngeal(NP) swabs in vial transport medium  Result Value Ref Range Status   SARS Coronavirus 2 by RT PCR NEGATIVE NEGATIVE Final    Comment: (NOTE) SARS-CoV-2 target nucleic acids are NOT DETECTED.  The SARS-CoV-2 RNA is generally detectable in upper respiratory specimens during the acute phase of infection. The lowest concentration of SARS-CoV-2 viral copies this assay can detect is 138 copies/mL. A negative result does not preclude SARS-Cov-2 infection and should not be used as the sole basis for treatment or other patient management decisions. A negative result may occur with  improper specimen collection/handling, submission of specimen other than nasopharyngeal swab, presence of viral  mutation(s) within the areas targeted by this assay, and inadequate number of viral copies(<138 copies/mL). A negative result must be combined with clinical observations, patient history, and epidemiological information. The expected result is Negative.  Fact Sheet for Patients:  BloggerCourse.com  Fact Sheet for Healthcare Providers:  SeriousBroker.it  This test is no t yet approved or cleared by the [place] FDA and  has been authorized for detection and/or diagnosis of SARS-CoV-2 by FDA under an Emergency Use Authorization (EUA). This EUA will remain  in effect (meaning this test can be used) for the duration of the COVID-19 declaration under Section 564(b)(1) of the Act, 21 U.S.C.section 360bbb-3(b)(1), unless the authorization is terminated  or revoked sooner.       Influenza A by PCR NEGATIVE NEGATIVE Final   Influenza B by PCR NEGATIVE NEGATIVE Final    Comment: (NOTE) The Xpert Xpress SARS-CoV-2/FLU/RSV plus assay is intended as an aid in the diagnosis of influenza from Nasopharyngeal swab specimens and should not be used as a sole basis for treatment. Nasal washings and aspirates are unacceptable for Xpert Xpress SARS-CoV-2/FLU/RSV testing.  Fact Sheet for Patients: BloggerCourse.com  Fact Sheet for Healthcare Providers: SeriousBroker.it  This test is not yet approved or cleared by the [place] FDA and has been authorized for detection and/or diagnosis of SARS-CoV-2 by FDA under an Emergency Use Authorization (EUA). This EUA will remain in effect (meaning this test can be used) for the duration of the COVID-19 declaration under Section 564(b)(1) of the Act, 21 U.S.C. section 360bbb-3(b)(1), unless the authorization is terminated or revoked.  Performed at [facility], [address] [place], [place] [address]          Radiology  Studies: ECHOCARDIOGRAM LIMITED  Result Date: 07/18/2020    ECHOCARDIOGRAM LIMITED REPORT   Patient Name:   KALYSTA KNEISLEY Date of Exam: 07/17/2020 Medical Rec #:  [id number]    Height:       62.0 in Accession #:    [id number]   Weight:       110.0 lb Date of Birth:  1930/11/24   BSA:          1.483 m Patient Age:    8 years     BP:           168/77 mmHg Patient Gender: F            HR:           91 bpm. Exam Location:  [facility] Procedure: 2D Echo, Cardiac Doppler and Color Doppler Indications:     Elevated troponin  History:         Patient has prior history of Echocardiogram examinations, most                  recent 09/14/2017. Risk Factors:Hypertension and Dyslipidemia.                  Stroke. Sleep apnea. Ischemic cardiomyopathy. Hypothyroidism.                  Coronary artery disease. Chronic systolic heart failure.                  NSTEMI.  Sonographer:     Wilford Sports Rodgers-Jones Referring Phys:  [id number] [place] Diagnosing Phys: Kate Sable MD IMPRESSIONS  1. Left ventricular ejection fraction, by estimation, is 40 to 45%. The left ventricle has mild to moderately decreased function. The left ventricle demonstrates regional wall motion abnormalities (see scoring diagram/findings for description). There is  mild left ventricular hypertrophy. Left ventricular diastolic parameters are consistent with Grade I diastolic dysfunction (impaired relaxation). There is severe hypokinesis of the left ventricular, basal-mid inferolateral wall and inferior wall.  2. Mild mitral valve regurgitation.  3. The aortic valve was not well visualized. Aortic valve regurgitation is mild. Mild aortic valve sclerosis is present, with no evidence of aortic valve stenosis. FINDINGS  Left Ventricle: Left ventricular ejection fraction, by estimation, is 40 to 45%. The left ventricle has mild to moderately decreased function. The left ventricle demonstrates regional wall motion abnormalities. Severe hypokinesis of the left  ventricular, basal-mid inferolateral wall and inferior wall. There is mild left ventricular hypertrophy. Left ventricular diastolic parameters are consistent with Grade I diastolic dysfunction (impaired relaxation). Left Atrium: Left atrial size was normal in size. Right Atrium: Right atrial size was normal in size. Mitral Valve: Mild mitral valve regurgitation. Aortic Valve: The aortic valve was not well visualized. Aortic valve regurgitation is mild. Mild aortic valve sclerosis is present, with no evidence of aortic valve stenosis. Aortic valve mean gradient measures 7.0 mmHg. Aortic valve peak gradient measures 11.9 mmHg. Aortic valve area, by VTI measures 1.79 cm. LEFT VENTRICLE PLAX 2D LVIDd:         4.29 cm  Diastology LVIDs:         3.50 cm  LV e' medial:    5.33 cm/s LV PW:         1.20 cm  LV E/e' medial:  8.3 LV IVS:        1.27 cm  LV e' lateral:  7.83 cm/s LVOT diam:     1.90 cm  LV E/e' lateral: 5.6 LV SV:         55 LV SV Index:   37 LVOT Area:     2.84 cm  RIGHT VENTRICLE RV Basal diam:  3.32 cm RV S prime:     13.40 cm/s TAPSE (M-mode): 1.7 cm LEFT ATRIUM             Index       RIGHT ATRIUM           Index LA diam:        4.30 cm 2.90 cm/m  RA Area:     11.20 cm LA Vol (A2C):   44.0 ml 29.67 ml/m RA Volume:   26.40 ml  17.80 ml/m LA Vol (A4C):   39.8 ml 26.84 ml/m LA Biplane Vol: 43.5 ml 29.34 ml/m  AORTIC VALVE AV Area (Vmax):    1.69 cm AV Area (Vmean):   1.59 cm AV Area (VTI):     1.79 cm AV Vmax:           172.33 cm/s AV Vmean:          127.333 cm/s AV VTI:            0.306 m AV Peak Grad:      11.9 mmHg AV Mean Grad:      7.0 mmHg LVOT Vmax:         103.00 cm/s LVOT Vmean:        71.400 cm/s LVOT VTI:          0.193 m LVOT/AV VTI ratio: 0.63  AORTA Ao Root diam: 3.00 cm MITRAL VALVE                TRICUSPID VALVE MV Area (PHT): 2.80 cm     TR Peak grad:   16.2 mmHg MV Decel Time: 271 msec     TR Vmax:        201.00 cm/s MV E velocity: 44.10 cm/s MV A velocity: 103.00 cm/s  SHUNTS  MV E/A ratio:  0.43         Systemic VTI:  0.19 m                             Systemic Diam: 1.90 cm Kate Sable MD Electronically signed by Kate Sable MD Signature Date/Time: 07/18/2020/7:20:35 AM    Final         Scheduled Meds: . aspirin  81 mg Oral Daily  . B-complex with vitamin C  1 tablet Oral Daily  . carvedilol  3.125 mg Oral BID WC  . enoxaparin (LOVENOX) injection  40 mg Subcutaneous Q24H  . feeding supplement  1 Container Oral TID BM  . levothyroxine  25 mcg Oral QAC breakfast  . lisinopril  20 mg Oral Daily  . multivitamin with minerals  1 tablet Oral QHS  . oxybutynin  5 mg Oral BID  . pantoprazole  40 mg Oral Daily   Continuous Infusions:    LOS: 0 days   Time spent: 66mins Greater than 50% of this time was spent in counseling, explanation of diagnosis, planning of further management, and coordination of care.   Voice Recognition Viviann Spare dictation system was used to create this note, attempts have been made to correct errors. Please contact the author with questions and/or clarifications.   Florencia Reasons, MD PhD FACP [facility]  Available via Epic  secure chat 7am-7pm for nonurgent issues Please page for urgent issues To page the attending provider between 7A-7P or the covering provider during after hours 7P-7A, please log into the web site [email or web address] and access using universal [place] password for that web site. If you do not have the password, please call the hospital operator.    07/19/2020, 3:45 PM

## 2020-07-19 NOTE — Anesthesia Preprocedure Evaluation (Addendum)
Anesthesia Evaluation  Patient identified by MRN, date of birth, ID band Patient awake  General Assessment Comment:Patient AO x 3  Reviewed: Allergy & Precautions, NPO status , Patient's Chart, lab work & pertinent test results  History of Anesthesia Complications Negative for: history of anesthetic complications  Airway Mallampati: II  TM Distance: >3 FB Neck ROM: Full    Dental no notable dental hx. (+) Teeth Intact   Pulmonary sleep apnea , neg COPD, Patient abstained from smoking.Not current smoker,    Pulmonary exam normal breath sounds clear to auscultation       Cardiovascular Exercise Tolerance: Poor METShypertension, + CAD, + Past MI and +CHF  (-) dysrhythmias  Rhythm:Regular Rate:Normal - Systolic murmurs TTE 1245 (hypokineses unchanged from prior echos, 2/2 MI): 1. Left ventricular ejection fraction, by estimation, is 40 to 45%. The  left ventricle has mild to moderately decreased function. The left  ventricle demonstrates regional wall motion abnormalities (see scoring  diagram/findings for description). There is  mild left ventricular hypertrophy. Left ventricular diastolic parameters  are consistent with Grade I diastolic dysfunction (impaired relaxation).  There is severe hypokinesis of the left ventricular, basal-mid  inferolateral wall and inferior wall.  2. Mild mitral valve regurgitation.  3. The aortic valve was not well visualized. Aortic valve regurgitation  is mild. Mild aortic valve sclerosis is present, with no evidence of  aortic valve stenosis.    Neuro/Psych PSYCHIATRIC DISORDERS Anxiety CVA, No Residual Symptoms    GI/Hepatic neg GERD  ,(+)     (-) substance abuse  ,   Endo/Other  neg diabetesHypothyroidism   Renal/GU negative Renal ROS     Musculoskeletal   Abdominal   Peds  Hematology  (+) anemia ,   Anesthesia Other Findings Past Medical History: No date: Anxiety No date:  AV block     Comment:  a. 08/2015 in setting of NSTEMI and RPAV               intervention-->required temp wire but not PPM. No date: Back pain No date: Chronic combined systolic (congestive) and diastolic  (congestive) heart failure (HCC)     Comment:  a. 08/2015  Echo: EF 55-60%, no rwma, mod MR, mildly dil               LA/RA, mod TR, PASP 65mmHg; b. 05/2016 Echo: EF 30-35%,               inflat/inf sev HK, Gr1 DD; c. 08/2017 Echo: EF 30-35%,               mild LVH. Sev inf/infseptal HK. Gr1 DD. Mod MR. mildly               dil LA. Mildly reduced RV fxn. No date: Coronary artery disease     Comment:  a. 08/2015 NSTEMI/PCI: LM nl, LAD 75p (med rx), 20               diffuse, 50d, LCX nl, OM1/2 nl, RCA 32m, RPAV 100               (2.25x12 Promus Premier DES) - complicated by 2:1 HB req               temp wire post-PCI; b. 04/2016 MV: EF 34%, lateral               HK/infarct. No ischemia. No date: Diverticulitis No date: Generalized anxiety disorder No date: Hepatic steatosis No date: HLD (hyperlipidemia) No  date: Hypertension No date: Hypokalemia No date: Hypomagnesemia No date: Hypothyroidism No date: Ischemic cardiomyopathy     Comment:  a. 08/2015  Echo: EF 55-60%; b. 05/2016 Echo: EF 30-35%;               c. 08/2017 Echo: EF 30-35%. No date: OA (osteoarthritis) No date: Osteopenia No date: Sleep apnea No date: Stroke [facility])     Comment:  a. 08/2015 Right PCA territory infarct/temoral lobe               cerebral infarction-->MRA showed severe flow liminting               stenosis of R P2 cerebral artery;  b. 08/2015 Carotid U/S:              1-39% bilat ICA stenosis. No date: Subdural hemorrhage ([place])     Comment:  a. 05/2013 in setting of fall. No date: Thrombocytopenia (HCC) No date: Weakness  Reproductive/Obstetrics                            Anesthesia Physical Anesthesia Plan  ASA: III  Anesthesia Plan: General   Post-op Pain Management:     Induction: Intravenous  PONV Risk Score and Plan: 3 and Ondansetron, Propofol infusion and TIVA  Airway Management Planned: Nasal Cannula  Additional Equipment: None  Intra-op Plan:   Post-operative Plan:   Informed Consent: I have reviewed the patients History and Physical, chart, labs and discussed the procedure including the risks, benefits and alternatives for the proposed anesthesia with the patient or authorized representative who has indicated his/her understanding and acceptance.     Dental advisory given  Plan Discussed with: CRNA and Surgeon  Anesthesia Plan Comments: (Discussed risks of anesthesia with patient, including possibility of difficulty with spontaneous ventilation under anesthesia necessitating airway intervention, PONV, and rare risks such as cardiac or respiratory or neurological events. Patient understands.)        Anesthesia Quick Evaluation

## 2020-07-19 NOTE — Transfer of Care (Signed)
Immediate Anesthesia Transfer of Care Note  Patient: Teresa Franklin  Procedure(s) Performed: COLONOSCOPY WITH PROPOFOL (N/A )  Patient Location: Endoscopy Unit  Anesthesia Type:General  Level of Consciousness: drowsy  Airway & Oxygen Therapy: Patient Spontanous Breathing  Post-op Assessment: Report given to RN and Post -op Vital signs reviewed and stable  Post vital signs: Reviewed  Last Vitals:  Vitals Value Taken Time  BP 130/56 07/19/20 1353  Temp    Pulse 67 07/19/20 1359  Resp 13 07/19/20 1359  SpO2 99 % 07/19/20 1359  Vitals shown include unvalidated device data.  Last Pain:  Vitals:   07/19/20 1343  TempSrc:   PainSc: Asleep         Complications: No complications documented.

## 2020-07-19 NOTE — Discharge Summary (Deleted)
Discharge Summary  Teresa Franklin [id number] DOB: Oct 20, 1930  PCP: [name], MD  Admit date: 07/17/2020 Discharge date: 07/20/2020  Time spent:  4mins  Recommendations for Outpatient Follow-up:  1. F/u with PCP within a week  for hospital discharge follow up, repeat cbc/bmp at follow up 2. F/u with GI as scheduled  3. F/u with cardiology as scheduled    Discharge Diagnoses:  Active Hospital Problems   Diagnosis Date Noted  . Colitis 07/17/2020    Resolved Hospital Problems  No resolved problems to display.    Discharge Condition: stable  Diet recommendation: heart healthy  Filed Weights   07/17/20 1046 07/19/20 1257  Weight: 49.9 kg 49.9 kg    History of present illness: ( per admitting MD Dr Nevada Crane) Chief Complaint: Abdominal pain.  HPI: Teresa Franklin is a 87 y.o. female with medical history significant for coronary artery disease, hypertension, chronic anxiety, hypothyroidism, CVA, presents with recurrent lower abdominal pain.  She was discharged from the hospital the day prior to presentation for the same.  States her pain is periumbilical.  Denies vomiting or diarrhea.  Repeated CT abdomen and pelvis with contrast done in the ED revealed progressive pancolonic wall thickening most pronounced within the transverse colon, consistent with a nonspecific infectious or inflammatory colitis.  EDP started Unasyn and requested admission.  ED Course:  Afebrile.  BP 187/90, pulse 76, respiratory rate 18, O2 saturation 99% room air.  Lab studies essentially unremarkable except for elevated troponin 33.  Twelve-lead EKG normal sinus rhythm with no evidence of acute ischemia.  Hospital Course:  Active Problems:   Colitis  Ab pain ( reason for admission) -Was in the hospital for the same from 4/23-4/26, C. difficile testing negative, GI panel negative last hospitalization, general surgery consulted during last admission, did not appear she received antibiotics, there was a  concern of ischemic colitis, she was eventually discharged with improvement of symptoms and outpatient vascular surgery follow-up -However she returned to the hospital with continued abdominal pain, CT abdomen showed " Progressive pancolonic wall thickening most pronounced within the transverse colon, consistent with a nonspecific infectious or inflammatory colitis." -She is hemodynamically stable, no fever, no leukocytosis, she does not appear septic -She is started on Unasyn since admission, GI consulted, colonoscopy today did not show any colitis, no need of antibiotics ,GI cleared her to go home -she tolerated diet , no ab pain, no n/v on day of discharge  Colon polyps S/p resection, pathology pending -Follow-up with GI   Mild elevation of troponin -High-sensitivity troponin mild and flat, not consistent with ACS, likely demand ischemia  -Denies chest pain -EKG no interval changes compared to one from 4/23, no acute ST-T changes -Echocardiogram left ventricular EF 40-45 %, ( improved from 2019) -f/u with cardiology next week   CAD reported history of stent placement, chronic systolic CHF Euvolemic to dry on exam, repeat echo EF 40 to 45% -Continue home regimen, aspirin, Coreg, lisinopril -There is no history of atorvastatin allergy Follow-up with cardiology outpatient   Hypertension Continue Coreg and lisinopril  ITP/thrombocytopenia Platelet appears at baseline  Hypothyroidism Continue Synthroid Nutritional Assessment:  The patient's BMI is: Body mass index is 20.12 kg/m.Marland Kitchen  DVT prophylaxis while in the hospital: enoxaparin (LOVENOX) injection 40 mg Start: 07/17/20 2200 SCDs Start: 07/17/20 1502   Code Status:DNR Family Communication: son at bedside daily, HPOA over the phone on 4/28 and 4/29 Disposition: home     Consultants:   GI Dr Marius Ditch  Procedures:  Colonoscopy on 4/29  Antimicrobials:   Anti-infectives (From admission,  onward)   Start     Dose/Rate Route Frequency Ordered Stop   07/17/20 2000  Ampicillin-Sulbactam (UNASYN) 3 g in sodium chloride 0.9 % 100 mL IVPB  Status:  Discontinued        3 g 200 mL/hr over 30 Minutes Intravenous Every 6 hours 07/17/20 1547 07/19/20 1356   07/17/20 1500  Ampicillin-Sulbactam (UNASYN) 3 g in sodium chloride 0.9 % 100 mL IVPB        3 g 200 mL/hr over 30 Minutes Intravenous  Once 07/17/20 1452 07/17/20 1612       Discharge Exam: BP (!) 155/62 (BP Location: Right Arm)   Pulse 76   Temp 99.1 F (37.3 C) (Oral)   Resp 16   Ht 5\' 2"  (1.575 m)   Wt 49.9 kg   SpO2 98%   BMI 20.12 kg/m   General: NAD, very pleasant Cardiovascular: RRR Respiratory: normal respiratory effort  Discharge Instructions You were cared for by a hospitalist during your hospital stay. If you have any questions about your discharge medications or the care you received while you were in the hospital after you are discharged, you can call the unit and asked to speak with the hospitalist on call if the hospitalist that took care of you is not available. Once you are discharged, your primary care physician will handle any further medical issues. Please note that NO REFILLS for any discharge medications will be authorized once you are discharged, as it is imperative that you return to your primary care physician (or establish a relationship with a primary care physician if you do not have one) for your aftercare needs so that they can reassess your need for medications and monitor your lab values.  Discharge Instructions    Diet - low sodium heart healthy   Complete by: As directed    Increase activity slowly   Complete by: As directed      Allergies as of 07/20/2020      Reactions   Codeine Nausea And Vomiting   Flomax [tamsulosin] Other (See Comments)   Pt states that this medication gave her a kidney infection.     Norco [hydrocodone-acetaminophen] Nausea And Vomiting   Sulfa Antibiotics  Nausea And Vomiting   Atorvastatin Other (See Comments)   Leg pain, leg weakness      Medication List    TAKE these medications   acetaminophen 325 MG tablet Commonly known as: TYLENOL Take 650 mg by mouth every 6 (six) hours as needed for mild pain, fever or headache.   ALPRAZolam 0.25 MG tablet Commonly known as: XANAX Take 1 tablet (0.25 mg total) by mouth at bedtime as needed for anxiety.   aspirin 81 MG chewable tablet Chew 1 tablet (81 mg total) by mouth daily.   carvedilol 6.25 MG tablet Commonly known as: COREG TAKE (1) TABLET BY MOUTH TWICE DAILY   CO Q 10 PO Take by mouth daily.   Folbic 2.5-25-2 MG Tabs tablet Generic drug: folic acid-pyridoxine-cyancobalamin Take 1 tablet by mouth daily.   hydroxypropyl methylcellulose / hypromellose 2.5 % ophthalmic solution Commonly known as: ISOPTO TEARS / GONIOVISC Place 1 drop into both eyes 4 (four) times daily as needed for dry eyes.   levothyroxine 25 MCG tablet Commonly known as: SYNTHROID Take 25 mcg by mouth daily before breakfast.   lisinopril 40 MG tablet Commonly known as: ZESTRIL Take 20 mg by mouth daily.   Multivitamin Gummies  Adult Chew Chew 2 each by mouth at bedtime.   PreserVision AREDS 2 Caps Take 1 capsule by mouth daily.   omeprazole 20 MG capsule Commonly known as: PRILOSEC Take 20 mg by mouth daily before breakfast.   oxybutynin 5 MG tablet Commonly known as: DITROPAN Take 1 tablet by mouth 2 (two) times daily.   polyethylene glycol 17 g packet Commonly known as: MIRALAX / GLYCOLAX Take 17 g by mouth daily as needed for mild constipation.   Simethicone 80 MG Tabs Take 80 mg by mouth daily as needed (gas).      Allergies  Allergen Reactions  . Codeine Nausea And Vomiting  . Flomax [Tamsulosin] Other (See Comments)    Pt states that this medication gave her a kidney infection.    Lebron Quam [Hydrocodone-Acetaminophen] Nausea And Vomiting  . Sulfa Antibiotics Nausea And Vomiting   . Atorvastatin Other (See Comments)    Leg pain, leg weakness    Follow-up Information    Lisbeth Ply Kudumu, MD Follow up on 07/23/2020.   Specialty: Family Medicine Why: for hospital discharge follow up Contact information: [place] [address] [phone]                The results of significant diagnostics from this hospitalization (including imaging, microbiology, ancillary and laboratory) are listed below for reference.    Significant Diagnostic Studies: CT Head Wo Contrast  Result Date: 07/13/2020 CLINICAL DATA:  Minor head trauma. EXAM: CT HEAD WITHOUT CONTRAST TECHNIQUE: Contiguous axial images were obtained from the base of the skull through the vertex without intravenous contrast. COMPARISON:  CT head 09/13/2015, MRI head 09/15/2015 FINDINGS: Brain: Patchy and confluent areas of decreased attenuation are noted throughout the deep and periventricular white matter of the cerebral hemispheres bilaterally, compatible with chronic microvascular ischemic disease. Similar-appearing right temporal lobe encephalomalacia. No evidence of large-territorial acute infarction. No parenchymal hemorrhage. No mass lesion. No extra-axial collection. No mass effect or midline shift. No hydrocephalus. Basilar cisterns are patent. Vascular: No hyperdense vessel. Skull: No acute fracture or focal lesion. Sinuses/Orbits: Paranasal sinuses and mastoid air cells are clear. The orbits are unremarkable. Other: None. IMPRESSION: No acute intracranial abnormality. Electronically Signed   By: Iven Finn M.D.   On: 07/13/2020 22:46   CT Abdomen Pelvis W Contrast  Result Date: 07/17/2020 CLINICAL DATA:  Abdominal pain EXAM: CT ABDOMEN AND PELVIS WITH CONTRAST TECHNIQUE: Multidetector CT imaging of the abdomen and pelvis was performed using the standard protocol following bolus administration of intravenous contrast. CONTRAST:  18mL OMNIPAQUE IOHEXOL 300 MG/ML  SOLN  COMPARISON:  07/13/2020 FINDINGS: Lower chest: Left basilar scarring or atelectasis. Coronary artery calcification. Hepatobiliary: No focal liver abnormality is seen. No gallstones, gallbladder wall thickening, or biliary dilatation. Pancreas: Unremarkable. No pancreatic ductal dilatation or surrounding inflammatory changes. Spleen: Mild splenomegaly. Unchanged subcentimeter hypodensities within the spleen. Adrenals/Urinary Tract: Unremarkable adrenal glands. Stable bilateral renal cysts. Suspicious renal lesion. No stone or hydronephrosis. Urinary bladder is largely obscured by streak artifact within the pelvis. Stomach/Bowel: Progressive pancolonic wall thickening most pronounced within the transverse colon (series 2, images 38-48). Scattered diverticulosis, most pronounced within the sigmoid colon. No dilated loops of bowel to suggest obstruction. Stomach appears within normal limits. Vascular/Lymphatic: Extensive atherosclerosis throughout the aortoiliac axis. No aneurysm. No large vessel occlusion evident on portal venous phase imaging. No abdominopelvic lymphadenopathy. Reproductive: No definite abnormality. Intrapelvic structures largely obscured by streak artifact. Other: No free fluid. No abdominopelvic fluid collection. No pneumoperitoneum. No  abdominal wall hernia. Musculoskeletal: Previous right hip arthroplasty. Thoracolumbar dextroscoliotic curvature. Advanced thoracolumbar degenerative disc disease and degenerative facet arthropathy. No new or acute osseous findings. IMPRESSION: 1. Progressive pancolonic wall thickening most pronounced within the transverse colon, consistent with a nonspecific infectious or inflammatory colitis. 2. Sigmoid diverticulosis. 3. Mild splenomegaly. 4. Aortic atherosclerosis (ICD10-I70.0). Electronically Signed   By: Davina Poke D.O.   On: 07/17/2020 14:11   CT ABDOMEN PELVIS W CONTRAST  Result Date: 07/13/2020 CLINICAL DATA:  Abdominal distension. EXAM: CT ABDOMEN  AND PELVIS WITH CONTRAST TECHNIQUE: Multidetector CT imaging of the abdomen and pelvis was performed using the standard protocol following bolus administration of intravenous contrast. CONTRAST:  44mL OMNIPAQUE IOHEXOL 300 MG/ML  SOLN COMPARISON:  None. FINDINGS: Lower chest: Linear atelectasis versus scarring within the left lower lobe. Coronary artery calcifications. Hepatobiliary: No focal liver abnormality. No gallstones, gallbladder wall thickening, or pericholecystic fluid. No biliary dilatation. Pancreas: No focal lesion. Normal pancreatic contour. No surrounding inflammatory changes. No main pancreatic ductal dilatation. Spleen: Borderline enlarged spleen measuring up to 13 cm. Couple of subcentimeter hypodensities too small to characterize (2:24, 27). Adrenals/Urinary Tract: No adrenal nodule bilaterally. Bilateral kidneys enhance symmetrically. Several subcentimeter hypodensities are too small to characterize. A 1.5 cm fluid density lesion within the right kidney likely represents a simple renal cyst. No hydronephrosis. No hydroureter. The urinary bladder is unremarkable. Stomach/Bowel: Stomach is within normal limits. No evidence of bowel wall thickening or dilatation. Fluid density within the lumen of the large bowel. Mild large bowel wall thickening and haziness of the distal transverse colon and left colon. Diffuse colonic diverticulosis. The appendix is not definitely identified. Vascular/Lymphatic: Main portal, splenic, superior mesenteric veins are patent. No abdominal aorta or iliac aneurysm. Severe calcified and noncalcified atherosclerotic plaque of the aorta and its branches. No abdominal, pelvic, or inguinal lymphadenopathy. Reproductive: Not well visualized due to streak artifact originating from the right femoral surgical hardware. Uterus and bilateral adnexal regions are grossly unremarkable. Other: Trace free fluid within the pelvis. No intraperitoneal free gas. No organized fluid  collection. Musculoskeletal: No abdominal wall hernia or abnormality. No suspicious lytic or blastic osseous lesions. No acute displaced fracture. Old healed right pelvic fracture. Multilevel degenerative changes of the spine in a patient with this extra scoliosis centered at the L2-L3 level. Total right hip arthroplasty partially visualized. IMPRESSION: 1. Colitis of the distal transverse colon and left colon. Differential diagnosis for etiology include ischemia, infection, inflammation. 2. Fast transition state. 3. Diffuse colonic diverticulosis with no acute diverticulitis. 4. Mild splenomegaly. 5.  Aortic Atherosclerosis (ICD10-I70.0). Electronically Signed   By: Iven Finn M.D.   On: 07/13/2020 23:07   ECHOCARDIOGRAM LIMITED  Result Date: 07/18/2020    ECHOCARDIOGRAM LIMITED REPORT   Patient Name:   DELAYAH LOU Date of Exam: 07/17/2020 Medical Rec #:  [id number]    Height:       62.0 in Accession #:    [id number]   Weight:       110.0 lb Date of Birth:  1930/11/24   BSA:          1.483 m Patient Age:    64 years     BP:           168/77 mmHg Patient Gender: F            HR:           91 bpm. Exam Location:  [facility] Procedure: 2D Echo, Cardiac Doppler and Color Doppler Indications:  Elevated troponin  History:         Patient has prior history of Echocardiogram examinations, most                  recent 09/14/2017. Risk Factors:Hypertension and Dyslipidemia.                  Stroke. Sleep apnea. Ischemic cardiomyopathy. Hypothyroidism.                  Coronary artery disease. Chronic systolic heart failure.                  NSTEMI.  Sonographer:     Wilford Sports Rodgers-Jones Referring Phys:  [id number] [place] Diagnosing Phys: Kate Sable MD IMPRESSIONS  1. Left ventricular ejection fraction, by estimation, is 40 to 45%. The left ventricle has mild to moderately decreased function. The left ventricle demonstrates regional wall motion abnormalities (see scoring diagram/findings for description).  There is  mild left ventricular hypertrophy. Left ventricular diastolic parameters are consistent with Grade I diastolic dysfunction (impaired relaxation). There is severe hypokinesis of the left ventricular, basal-mid inferolateral wall and inferior wall.  2. Mild mitral valve regurgitation.  3. The aortic valve was not well visualized. Aortic valve regurgitation is mild. Mild aortic valve sclerosis is present, with no evidence of aortic valve stenosis. FINDINGS  Left Ventricle: Left ventricular ejection fraction, by estimation, is 40 to 45%. The left ventricle has mild to moderately decreased function. The left ventricle demonstrates regional wall motion abnormalities. Severe hypokinesis of the left ventricular, basal-mid inferolateral wall and inferior wall. There is mild left ventricular hypertrophy. Left ventricular diastolic parameters are consistent with Grade I diastolic dysfunction (impaired relaxation). Left Atrium: Left atrial size was normal in size. Right Atrium: Right atrial size was normal in size. Mitral Valve: Mild mitral valve regurgitation. Aortic Valve: The aortic valve was not well visualized. Aortic valve regurgitation is mild. Mild aortic valve sclerosis is present, with no evidence of aortic valve stenosis. Aortic valve mean gradient measures 7.0 mmHg. Aortic valve peak gradient measures 11.9 mmHg. Aortic valve area, by VTI measures 1.79 cm. LEFT VENTRICLE PLAX 2D LVIDd:         4.29 cm  Diastology LVIDs:         3.50 cm  LV e' medial:    5.33 cm/s LV PW:         1.20 cm  LV E/e' medial:  8.3 LV IVS:        1.27 cm  LV e' lateral:   7.83 cm/s LVOT diam:     1.90 cm  LV E/e' lateral: 5.6 LV SV:         55 LV SV Index:   37 LVOT Area:     2.84 cm  RIGHT VENTRICLE RV Basal diam:  3.32 cm RV S prime:     13.40 cm/s TAPSE (M-mode): 1.7 cm LEFT ATRIUM             Index       RIGHT ATRIUM           Index LA diam:        4.30 cm 2.90 cm/m  RA Area:     11.20 cm LA Vol (A2C):   44.0 ml 29.67 ml/m  RA Volume:   26.40 ml  17.80 ml/m LA Vol (A4C):   39.8 ml 26.84 ml/m LA Biplane Vol: 43.5 ml 29.34 ml/m  AORTIC VALVE AV Area (Vmax):  1.69 cm AV Area (Vmean):   1.59 cm AV Area (VTI):     1.79 cm AV Vmax:           172.33 cm/s AV Vmean:          127.333 cm/s AV VTI:            0.306 m AV Peak Grad:      11.9 mmHg AV Mean Grad:      7.0 mmHg LVOT Vmax:         103.00 cm/s LVOT Vmean:        71.400 cm/s LVOT VTI:          0.193 m LVOT/AV VTI ratio: 0.63  AORTA Ao Root diam: 3.00 cm MITRAL VALVE                TRICUSPID VALVE MV Area (PHT): 2.80 cm     TR Peak grad:   16.2 mmHg MV Decel Time: 271 msec     TR Vmax:        201.00 cm/s MV E velocity: 44.10 cm/s MV A velocity: 103.00 cm/s  SHUNTS MV E/A ratio:  0.43         Systemic VTI:  0.19 m                             Systemic Diam: 1.90 cm Kate Sable MD Electronically signed by Kate Sable MD Signature Date/Time: 07/18/2020/7:20:35 AM    Final     Microbiology: Recent Results (from the past 240 hour(s))  Gastrointestinal Panel by PCR , Stool     Status: None   Collection Time: 07/13/20 11:42 PM   Specimen: Stool  Result Value Ref Range Status   Campylobacter species NOT DETECTED NOT DETECTED Final   Plesimonas shigelloides NOT DETECTED NOT DETECTED Final   Salmonella species NOT DETECTED NOT DETECTED Final   Yersinia enterocolitica NOT DETECTED NOT DETECTED Final   Vibrio species NOT DETECTED NOT DETECTED Final   Vibrio cholerae NOT DETECTED NOT DETECTED Final   Enteroaggregative E coli (EAEC) NOT DETECTED NOT DETECTED Final   Enteropathogenic E coli (EPEC) NOT DETECTED NOT DETECTED Final   Enterotoxigenic E coli (ETEC) NOT DETECTED NOT DETECTED Final   Shiga like toxin producing E coli (STEC) NOT DETECTED NOT DETECTED Final   Shigella/Enteroinvasive E coli (EIEC) NOT DETECTED NOT DETECTED Final   Cryptosporidium NOT DETECTED NOT DETECTED Final   Cyclospora cayetanensis NOT DETECTED NOT DETECTED Final   Entamoeba  histolytica NOT DETECTED NOT DETECTED Final   Giardia lamblia NOT DETECTED NOT DETECTED Final   Adenovirus F40/41 NOT DETECTED NOT DETECTED Final   Astrovirus NOT DETECTED NOT DETECTED Final   Norovirus GI/GII NOT DETECTED NOT DETECTED Final   Rotavirus A NOT DETECTED NOT DETECTED Final   Sapovirus (I, II, IV, and V) NOT DETECTED NOT DETECTED Final    Comment: Performed at [facility], [address] [place], [place] [address]  C Difficile Quick Screen w PCR reflex     Status: None   Collection Time: 07/13/20 11:42 PM   Specimen: Stool  Result Value Ref Range Status   C Diff antigen NEGATIVE NEGATIVE Final   C Diff toxin NEGATIVE NEGATIVE Final   C Diff interpretation No C. difficile detected.  Final    Comment: Performed at [facility], [address] [place], [place] [address]  Resp Panel by RT-PCR (Flu A&B, Covid)  Status: None   Collection Time: 07/13/20 11:48 PM   Specimen: Nasopharyngeal(NP) swabs in vial transport medium  Result Value Ref Range Status   SARS Coronavirus 2 by RT PCR NEGATIVE NEGATIVE Final    Comment: (NOTE) SARS-CoV-2 target nucleic acids are NOT DETECTED.  The SARS-CoV-2 RNA is generally detectable in upper respiratory specimens during the acute phase of infection. The lowest concentration of SARS-CoV-2 viral copies this assay can detect is 138 copies/mL. A negative result does not preclude SARS-Cov-2 infection and should not be used as the sole basis for treatment or other patient management decisions. A negative result may occur with  improper specimen collection/handling, submission of specimen other than nasopharyngeal swab, presence of viral mutation(s) within the areas targeted by this assay, and inadequate number of viral copies(<138 copies/mL). A negative result must be combined with clinical observations, patient history, and epidemiological information. The expected result is Negative.  Fact Sheet for Patients:   EntrepreneurPulse.com.au  Fact Sheet for Healthcare Providers:  IncredibleEmployment.be  This test is no t yet approved or cleared by the [place] FDA and  has been authorized for detection and/or diagnosis of SARS-CoV-2 by FDA under an Emergency Use Authorization (EUA). This EUA will remain  in effect (meaning this test can be used) for the duration of the COVID-19 declaration under Section 564(b)(1) of the Act, 21 U.S.C.section 360bbb-3(b)(1), unless the authorization is terminated  or revoked sooner.       Influenza A by PCR NEGATIVE NEGATIVE Final   Influenza B by PCR NEGATIVE NEGATIVE Final    Comment: (NOTE) The Xpert Xpress SARS-CoV-2/FLU/RSV plus assay is intended as an aid in the diagnosis of influenza from Nasopharyngeal swab specimens and should not be used as a sole basis for treatment. Nasal washings and aspirates are unacceptable for Xpert Xpress SARS-CoV-2/FLU/RSV testing.  Fact Sheet for Patients: EntrepreneurPulse.com.au  Fact Sheet for Healthcare Providers: IncredibleEmployment.be  This test is not yet approved or cleared by the [place] FDA and has been authorized for detection and/or diagnosis of SARS-CoV-2 by FDA under an Emergency Use Authorization (EUA). This EUA will remain in effect (meaning this test can be used) for the duration of the COVID-19 declaration under Section 564(b)(1) of the Act, 21 U.S.C. section 360bbb-3(b)(1), unless the authorization is terminated or revoked.  Performed at [facility], [address] [place], [place] [address]   Resp Panel by RT-PCR (Flu A&B, Covid) Nasopharyngeal Swab     Status: None   Collection Time: 07/17/20  2:44 PM   Specimen: Nasopharyngeal Swab; Nasopharyngeal(NP) swabs in vial transport medium  Result Value Ref Range Status   SARS Coronavirus 2 by RT PCR NEGATIVE NEGATIVE Final    Comment: (NOTE) SARS-CoV-2  target nucleic acids are NOT DETECTED.  The SARS-CoV-2 RNA is generally detectable in upper respiratory specimens during the acute phase of infection. The lowest concentration of SARS-CoV-2 viral copies this assay can detect is 138 copies/mL. A negative result does not preclude SARS-Cov-2 infection and should not be used as the sole basis for treatment or other patient management decisions. A negative result may occur with  improper specimen collection/handling, submission of specimen other than nasopharyngeal swab, presence of viral mutation(s) within the areas targeted by this assay, and inadequate number of viral copies(<138 copies/mL). A negative result must be combined with clinical observations, patient history, and epidemiological information. The expected result is Negative.  Fact Sheet for Patients:  EntrepreneurPulse.com.au  Fact Sheet for Healthcare Providers:  IncredibleEmployment.be  This test  is no t yet approved or cleared by the [place] and  has been authorized for detection and/or diagnosis of SARS-CoV-2 by FDA under an Emergency Use Authorization (EUA). This EUA will remain  in effect (meaning this test can be used) for the duration of the COVID-19 declaration under Section 564(b)(1) of the Act, 21 U.S.C.section 360bbb-3(b)(1), unless the authorization is terminated  or revoked sooner.       Influenza A by PCR NEGATIVE NEGATIVE Final   Influenza B by PCR NEGATIVE NEGATIVE Final    Comment: (NOTE) The Xpert Xpress SARS-CoV-2/FLU/RSV plus assay is intended as an aid in the diagnosis of influenza from Nasopharyngeal swab specimens and should not be used as a sole basis for treatment. Nasal washings and aspirates are unacceptable for Xpert Xpress SARS-CoV-2/FLU/RSV testing.  Fact Sheet for Patients: EntrepreneurPulse.com.au  Fact Sheet for Healthcare  Providers: IncredibleEmployment.be  This test is not yet approved or cleared by the [place] FDA and has been authorized for detection and/or diagnosis of SARS-CoV-2 by FDA under an Emergency Use Authorization (EUA). This EUA will remain in effect (meaning this test can be used) for the duration of the COVID-19 declaration under Section 564(b)(1) of the Act, 21 U.S.C. section 360bbb-3(b)(1), unless the authorization is terminated or revoked.  Performed at [facility], [address] [place], [place] [address]      Labs: Basic Metabolic Panel: Recent Labs  Lab 07/14/20 0223 07/17/20 1049 07/17/20 1502 07/18/20 0333 07/19/20 0413 07/20/20 0354  NA 141 139  --  139 141 140  K 4.8 3.6  --  3.6 3.6 3.9  CL 106 103  --  105 106 106  CO2 27 28  --  26 26 28   GLUCOSE 137* 113*  --  113* 107* 104*  BUN 16 21  --  22 13 15   CREATININE 0.82 0.87 0.74 0.66 0.65 0.86  CALCIUM 9.3 10.2  --  9.7 9.4 9.7  MG  --   --   --   --  1.7 1.8   Liver Function Tests: Recent Labs  Lab 07/13/20 2045 07/17/20 1049  AST 37 21  ALT 18 14  ALKPHOS 63 66  BILITOT 1.6* 0.9  PROT 6.5 6.5  ALBUMIN 3.9 3.9   Recent Labs  Lab 07/13/20 2045 07/17/20 1049  LIPASE 38 37   No results for input(s): AMMONIA in the last 168 hours. CBC: Recent Labs  Lab 07/13/20 2045 07/14/20 0223 07/15/20 0336 07/17/20 1049 07/18/20 0333 07/19/20 0413 07/20/20 0354  WBC 10.0   < > 4.0 5.0 4.9 4.1 3.8*  NEUTROABS 8.8*  --   --  3.6  --   --  2.3  HGB 15.0   < > 11.6* 14.2 12.0 12.7 11.9*  HCT 43.4   < > 34.1* 41.0 35.2* 37.8 34.5*  MCV 92.9   < > 92.7 92.8 94.9 96.9 94.8  PLT 103*   < > 71* 100* 98* 85* 81*   < > = values in this interval not displayed.   Cardiac Enzymes: No results for input(s): CKTOTAL, CKMB, CKMBINDEX, TROPONINI in the last 168 hours. BNP: BNP (last 3 results) No results for input(s): BNP in the last 8760 hours.  ProBNP (last 3 results) No  results for input(s): PROBNP in the last 8760 hours.  CBG: No results for input(s): GLUCAP in the last 168 hours.     Signed:  Florencia Reasons MD, PhD, FACP  [facility] 07/20/2020, 10:52 AM

## 2020-07-20 DIAGNOSIS — R109 Unspecified abdominal pain: Secondary | ICD-10-CM | POA: Diagnosis not present

## 2020-07-20 LAB — BASIC METABOLIC PANEL
Anion gap: 6 (ref 5–15)
BUN: 15 mg/dL (ref 8–23)
CO2: 28 mmol/L (ref 22–32)
Calcium: 9.7 mg/dL (ref 8.9–10.3)
Chloride: 106 mmol/L (ref 98–111)
Creatinine, Ser: 0.86 mg/dL (ref 0.44–1.00)
GFR, Estimated: >60 mL/min (ref >60)
Glucose, Bld: 104 mg/dL — ABNORMAL HIGH (ref 70–99)
Potassium: 3.9 mmol/L (ref 3.5–5.1)
Sodium: 140 mmol/L (ref 135–145)

## 2020-07-20 LAB — CBC WITH DIFFERENTIAL/PLATELET
Abs Immature Granulocytes: 0.01 10*3/uL (ref 0.00–0.07)
Basophils Absolute: 0 10*3/uL (ref 0.0–0.1)
Basophils Relative: 1 %
Eosinophils Absolute: 0.2 10*3/uL (ref 0.0–0.5)
Eosinophils Relative: 4 %
HCT: 34.5 % — ABNORMAL LOW (ref 36.0–46.0)
Hemoglobin: 11.9 g/dL — ABNORMAL LOW (ref 12.0–15.0)
Immature Granulocytes: 0 %
Lymphocytes Relative: 27 %
Lymphs Abs: 1.1 10*3/uL (ref 0.7–4.0)
MCH: 32.7 pg (ref 26.0–34.0)
MCHC: 34.5 g/dL (ref 30.0–36.0)
MCV: 94.8 fL (ref 80.0–100.0)
Monocytes Absolute: 0.3 10*3/uL (ref 0.1–1.0)
Monocytes Relative: 9 %
Neutro Abs: 2.3 10*3/uL (ref 1.7–7.7)
Neutrophils Relative %: 59 %
Platelets: 81 10*3/uL — ABNORMAL LOW (ref 150–400)
RBC: 3.64 MIL/uL — ABNORMAL LOW (ref 3.87–5.11)
RDW: 13.7 % (ref 11.5–15.5)
WBC: 3.8 10*3/uL — ABNORMAL LOW (ref 4.0–10.5)
nRBC: 0 % (ref 0.0–0.2)

## 2020-07-20 LAB — MAGNESIUM: Magnesium: 1.8 mg/dL (ref 1.7–2.4)

## 2020-07-20 NOTE — Discharge Summary (Signed)
Discharge Summary  Teresa Franklin [id number] DOB: 12-15-1930  PCP: [name], MD  Admit date: 07/17/2020 Discharge date: 07/20/2020  Time spent:  38mins  Recommendations for Outpatient Follow-up:  1. F/u with PCP within a week  for hospital discharge follow up, repeat cbc/bmp at follow up 2. F/u with GI as scheduled  3. F/u with cardiology as scheduled    Discharge Diagnoses:  Active Hospital Problems   Diagnosis Date Noted  . Colitis 07/17/2020    Resolved Hospital Problems  No resolved problems to display.    Discharge Condition: stable  Diet recommendation: heart healthy  Filed Weights   07/17/20 1046 07/19/20 1257  Weight: 49.9 kg 49.9 kg    History of present illness: ( per admitting MD Dr Nevada Crane) Chief Complaint: Abdominal pain.  HPI: Teresa Franklin is a 87 y.o. female with medical history significant for coronary artery disease, hypertension, chronic anxiety, hypothyroidism, CVA, presents with recurrent lower abdominal pain.  She was discharged from the hospital the day prior to presentation for the same.  States her pain is periumbilical.  Denies vomiting or diarrhea.  Repeated CT abdomen and pelvis with contrast done in the ED revealed progressive pancolonic wall thickening most pronounced within the transverse colon, consistent with a nonspecific infectious or inflammatory colitis.  EDP started Unasyn and requested admission.  ED Course:  Afebrile.  BP 187/90, pulse 76, respiratory rate 18, O2 saturation 99% room air.  Lab studies essentially unremarkable except for elevated troponin 33.  Twelve-lead EKG normal sinus rhythm with no evidence of acute ischemia.  Hospital Course:  Active Problems:   Colitis  Ab pain ( reason for admission) -Was in the hospital for the same from 4/23-4/26, C. difficile testing negative, GI panel negative last hospitalization, general surgery consulted during last admission, did not appear she received antibiotics, there was a  concern of ischemic colitis, she was eventually discharged with improvement of symptoms and outpatient vascular surgery follow-up -However she returned to the hospital with continued abdominal pain, CT abdomen showed " Progressive pancolonic wall thickening most pronounced within the transverse colon, consistent with a nonspecific infectious or inflammatory colitis." -She is hemodynamically stable, no fever, no leukocytosis, she does not appear septic -She is started on Unasyn since admission, GI consulted, colonoscopy today did not show any colitis, no need of antibiotics ,GI cleared her to go home -she tolerated diet , no ab pain, no n/v on day of discharge  Colon polyps S/p resection, pathology pending -Follow-up with GI   Mild elevation of troponin -High-sensitivity troponin mild and flat, not consistent with ACS, likely demand ischemia  -Denies chest pain -EKG no interval changes compared to one from 4/23, no acute ST-T changes -Echocardiogram left ventricular EF 40-45 %, ( improved from 2019) -f/u with cardiology next week   CAD reported history of stent placement, chronic systolic CHF Euvolemic to dry on exam, repeat echo EF 40 to 45% -Continue home regimen, aspirin, Coreg, lisinopril -There is no history of atorvastatin allergy Follow-up with cardiology outpatient   Hypertension Continue Coreg and lisinopril  ITP/thrombocytopenia Platelet appears at baseline  Hypothyroidism Continue Synthroid Nutritional Assessment:  The patient's BMI is: Body mass index is 20.12 kg/m.Marland Kitchen  DVT prophylaxis while in the hospital: enoxaparin (LOVENOX) injection 40 mg Start: 07/17/20 2200 SCDs Start: 07/17/20 1502   Code Status:DNR Family Communication: son at bedside daily, HPOA over the phone on 4/28 and 4/29 Disposition: home     Consultants:   GI Dr Marius Ditch  Procedures:  Colonoscopy on 4/29  Antimicrobials:   Anti-infectives (From admission,  onward)   Start     Dose/Rate Route Frequency Ordered Stop   07/17/20 2000  Ampicillin-Sulbactam (UNASYN) 3 g in sodium chloride 0.9 % 100 mL IVPB  Status:  Discontinued        3 g 200 mL/hr over 30 Minutes Intravenous Every 6 hours 07/17/20 1547 07/19/20 1356   07/17/20 1500  Ampicillin-Sulbactam (UNASYN) 3 g in sodium chloride 0.9 % 100 mL IVPB        3 g 200 mL/hr over 30 Minutes Intravenous  Once 07/17/20 1452 07/17/20 1612       Discharge Exam: BP (!) 155/62 (BP Location: Right Arm)   Pulse 76   Temp 99.1 F (37.3 C) (Oral)   Resp 16   Ht 5\' 2"  (1.575 m)   Wt 49.9 kg   SpO2 98%   BMI 20.12 kg/m   General: NAD, very pleasant Cardiovascular: RRR Respiratory: normal respiratory effort  Discharge Instructions You were cared for by a hospitalist during your hospital stay. If you have any questions about your discharge medications or the care you received while you were in the hospital after you are discharged, you can call the unit and asked to speak with the hospitalist on call if the hospitalist that took care of you is not available. Once you are discharged, your primary care physician will handle any further medical issues. Please note that NO REFILLS for any discharge medications will be authorized once you are discharged, as it is imperative that you return to your primary care physician (or establish a relationship with a primary care physician if you do not have one) for your aftercare needs so that they can reassess your need for medications and monitor your lab values.  Discharge Instructions    Diet - low sodium heart healthy   Complete by: As directed    Increase activity slowly   Complete by: As directed      Allergies as of 07/20/2020      Reactions   Codeine Nausea And Vomiting   Flomax [tamsulosin] Other (See Comments)   Pt states that this medication gave her a kidney infection.     Norco [hydrocodone-acetaminophen] Nausea And Vomiting   Sulfa Antibiotics  Nausea And Vomiting   Atorvastatin Other (See Comments)   Leg pain, leg weakness      Medication List    TAKE these medications   acetaminophen 325 MG tablet Commonly known as: TYLENOL Take 650 mg by mouth every 6 (six) hours as needed for mild pain, fever or headache.   ALPRAZolam 0.25 MG tablet Commonly known as: XANAX Take 1 tablet (0.25 mg total) by mouth at bedtime as needed for anxiety.   aspirin 81 MG chewable tablet Chew 1 tablet (81 mg total) by mouth daily.   carvedilol 6.25 MG tablet Commonly known as: COREG TAKE (1) TABLET BY MOUTH TWICE DAILY   CO Q 10 PO Take by mouth daily.   Folbic 2.5-25-2 MG Tabs tablet Generic drug: folic acid-pyridoxine-cyancobalamin Take 1 tablet by mouth daily.   hydroxypropyl methylcellulose / hypromellose 2.5 % ophthalmic solution Commonly known as: ISOPTO TEARS / GONIOVISC Place 1 drop into both eyes 4 (four) times daily as needed for dry eyes.   levothyroxine 25 MCG tablet Commonly known as: SYNTHROID Take 25 mcg by mouth daily before breakfast.   lisinopril 40 MG tablet Commonly known as: ZESTRIL Take 20 mg by mouth daily.   Multivitamin Gummies  Adult Chew Chew 2 each by mouth at bedtime.   PreserVision AREDS 2 Caps Take 1 capsule by mouth daily.   omeprazole 20 MG capsule Commonly known as: PRILOSEC Take 20 mg by mouth daily before breakfast.   oxybutynin 5 MG tablet Commonly known as: DITROPAN Take 1 tablet by mouth 2 (two) times daily.   polyethylene glycol 17 g packet Commonly known as: MIRALAX / GLYCOLAX Take 17 g by mouth daily as needed for mild constipation.   Simethicone 80 MG Tabs Take 80 mg by mouth daily as needed (gas).      Allergies  Allergen Reactions  . Codeine Nausea And Vomiting  . Flomax [Tamsulosin] Other (See Comments)    Pt states that this medication gave her a kidney infection.    Lebron Quam [Hydrocodone-Acetaminophen] Nausea And Vomiting  . Sulfa Antibiotics Nausea And Vomiting   . Atorvastatin Other (See Comments)    Leg pain, leg weakness    Follow-up Information    Lisbeth Ply Kudumu, MD Follow up on 07/23/2020.   Specialty: Family Medicine Why: for hospital discharge follow up Contact information: [place] [address] [phone]                The results of significant diagnostics from this hospitalization (including imaging, microbiology, ancillary and laboratory) are listed below for reference.    Significant Diagnostic Studies: CT Head Wo Contrast  Result Date: 07/13/2020 CLINICAL DATA:  Minor head trauma. EXAM: CT HEAD WITHOUT CONTRAST TECHNIQUE: Contiguous axial images were obtained from the base of the skull through the vertex without intravenous contrast. COMPARISON:  CT head 09/13/2015, MRI head 09/15/2015 FINDINGS: Brain: Patchy and confluent areas of decreased attenuation are noted throughout the deep and periventricular white matter of the cerebral hemispheres bilaterally, compatible with chronic microvascular ischemic disease. Similar-appearing right temporal lobe encephalomalacia. No evidence of large-territorial acute infarction. No parenchymal hemorrhage. No mass lesion. No extra-axial collection. No mass effect or midline shift. No hydrocephalus. Basilar cisterns are patent. Vascular: No hyperdense vessel. Skull: No acute fracture or focal lesion. Sinuses/Orbits: Paranasal sinuses and mastoid air cells are clear. The orbits are unremarkable. Other: None. IMPRESSION: No acute intracranial abnormality. Electronically Signed   By: Iven Finn M.D.   On: 07/13/2020 22:46   CT Abdomen Pelvis W Contrast  Result Date: 07/17/2020 CLINICAL DATA:  Abdominal pain EXAM: CT ABDOMEN AND PELVIS WITH CONTRAST TECHNIQUE: Multidetector CT imaging of the abdomen and pelvis was performed using the standard protocol following bolus administration of intravenous contrast. CONTRAST:  18mL OMNIPAQUE IOHEXOL 300 MG/ML  SOLN  COMPARISON:  07/13/2020 FINDINGS: Lower chest: Left basilar scarring or atelectasis. Coronary artery calcification. Hepatobiliary: No focal liver abnormality is seen. No gallstones, gallbladder wall thickening, or biliary dilatation. Pancreas: Unremarkable. No pancreatic ductal dilatation or surrounding inflammatory changes. Spleen: Mild splenomegaly. Unchanged subcentimeter hypodensities within the spleen. Adrenals/Urinary Tract: Unremarkable adrenal glands. Stable bilateral renal cysts. Suspicious renal lesion. No stone or hydronephrosis. Urinary bladder is largely obscured by streak artifact within the pelvis. Stomach/Bowel: Progressive pancolonic wall thickening most pronounced within the transverse colon (series 2, images 38-48). Scattered diverticulosis, most pronounced within the sigmoid colon. No dilated loops of bowel to suggest obstruction. Stomach appears within normal limits. Vascular/Lymphatic: Extensive atherosclerosis throughout the aortoiliac axis. No aneurysm. No large vessel occlusion evident on portal venous phase imaging. No abdominopelvic lymphadenopathy. Reproductive: No definite abnormality. Intrapelvic structures largely obscured by streak artifact. Other: No free fluid. No abdominopelvic fluid collection. No pneumoperitoneum. No  abdominal wall hernia. Musculoskeletal: Previous right hip arthroplasty. Thoracolumbar dextroscoliotic curvature. Advanced thoracolumbar degenerative disc disease and degenerative facet arthropathy. No new or acute osseous findings. IMPRESSION: 1. Progressive pancolonic wall thickening most pronounced within the transverse colon, consistent with a nonspecific infectious or inflammatory colitis. 2. Sigmoid diverticulosis. 3. Mild splenomegaly. 4. Aortic atherosclerosis (ICD10-I70.0). Electronically Signed   By: Davina Poke D.O.   On: 07/17/2020 14:11   CT ABDOMEN PELVIS W CONTRAST  Result Date: 07/13/2020 CLINICAL DATA:  Abdominal distension. EXAM: CT ABDOMEN  AND PELVIS WITH CONTRAST TECHNIQUE: Multidetector CT imaging of the abdomen and pelvis was performed using the standard protocol following bolus administration of intravenous contrast. CONTRAST:  13mL OMNIPAQUE IOHEXOL 300 MG/ML  SOLN COMPARISON:  None. FINDINGS: Lower chest: Linear atelectasis versus scarring within the left lower lobe. Coronary artery calcifications. Hepatobiliary: No focal liver abnormality. No gallstones, gallbladder wall thickening, or pericholecystic fluid. No biliary dilatation. Pancreas: No focal lesion. Normal pancreatic contour. No surrounding inflammatory changes. No main pancreatic ductal dilatation. Spleen: Borderline enlarged spleen measuring up to 13 cm. Couple of subcentimeter hypodensities too small to characterize (2:24, 27). Adrenals/Urinary Tract: No adrenal nodule bilaterally. Bilateral kidneys enhance symmetrically. Several subcentimeter hypodensities are too small to characterize. A 1.5 cm fluid density lesion within the right kidney likely represents a simple renal cyst. No hydronephrosis. No hydroureter. The urinary bladder is unremarkable. Stomach/Bowel: Stomach is within normal limits. No evidence of bowel wall thickening or dilatation. Fluid density within the lumen of the large bowel. Mild large bowel wall thickening and haziness of the distal transverse colon and left colon. Diffuse colonic diverticulosis. The appendix is not definitely identified. Vascular/Lymphatic: Main portal, splenic, superior mesenteric veins are patent. No abdominal aorta or iliac aneurysm. Severe calcified and noncalcified atherosclerotic plaque of the aorta and its branches. No abdominal, pelvic, or inguinal lymphadenopathy. Reproductive: Not well visualized due to streak artifact originating from the right femoral surgical hardware. Uterus and bilateral adnexal regions are grossly unremarkable. Other: Trace free fluid within the pelvis. No intraperitoneal free gas. No organized fluid  collection. Musculoskeletal: No abdominal wall hernia or abnormality. No suspicious lytic or blastic osseous lesions. No acute displaced fracture. Old healed right pelvic fracture. Multilevel degenerative changes of the spine in a patient with this extra scoliosis centered at the L2-L3 level. Total right hip arthroplasty partially visualized. IMPRESSION: 1. Colitis of the distal transverse colon and left colon. Differential diagnosis for etiology include ischemia, infection, inflammation. 2. Fast transition state. 3. Diffuse colonic diverticulosis with no acute diverticulitis. 4. Mild splenomegaly. 5.  Aortic Atherosclerosis (ICD10-I70.0). Electronically Signed   By: Iven Finn M.D.   On: 07/13/2020 23:07   ECHOCARDIOGRAM LIMITED  Result Date: 07/18/2020    ECHOCARDIOGRAM LIMITED REPORT   Patient Name:   BRYNLYN BAFFORD Date of Exam: 07/17/2020 Medical Rec #:  [id number]    Height:       62.0 in Accession #:    [id number]   Weight:       110.0 lb Date of Birth:  [birth year]   BSA:          1.483 m Patient Age:    36 years     BP:           168/77 mmHg Patient Gender: F            HR:           91 bpm. Exam Location:  [facility] Procedure: 2D Echo, Cardiac Doppler and Color Doppler Indications:  Elevated troponin  History:         Patient has prior history of Echocardiogram examinations, most                  recent 09/14/2017. Risk Factors:Hypertension and Dyslipidemia.                  Stroke. Sleep apnea. Ischemic cardiomyopathy. Hypothyroidism.                  Coronary artery disease. Chronic systolic heart failure.                  NSTEMI.  Sonographer:     Wilford Sports Rodgers-Jones Referring Phys:  [id number] [place] Diagnosing Phys: Kate Sable MD IMPRESSIONS  1. Left ventricular ejection fraction, by estimation, is 40 to 45%. The left ventricle has mild to moderately decreased function. The left ventricle demonstrates regional wall motion abnormalities (see scoring diagram/findings for description).  There is  mild left ventricular hypertrophy. Left ventricular diastolic parameters are consistent with Grade I diastolic dysfunction (impaired relaxation). There is severe hypokinesis of the left ventricular, basal-mid inferolateral wall and inferior wall.  2. Mild mitral valve regurgitation.  3. The aortic valve was not well visualized. Aortic valve regurgitation is mild. Mild aortic valve sclerosis is present, with no evidence of aortic valve stenosis. FINDINGS  Left Ventricle: Left ventricular ejection fraction, by estimation, is 40 to 45%. The left ventricle has mild to moderately decreased function. The left ventricle demonstrates regional wall motion abnormalities. Severe hypokinesis of the left ventricular, basal-mid inferolateral wall and inferior wall. There is mild left ventricular hypertrophy. Left ventricular diastolic parameters are consistent with Grade I diastolic dysfunction (impaired relaxation). Left Atrium: Left atrial size was normal in size. Right Atrium: Right atrial size was normal in size. Mitral Valve: Mild mitral valve regurgitation. Aortic Valve: The aortic valve was not well visualized. Aortic valve regurgitation is mild. Mild aortic valve sclerosis is present, with no evidence of aortic valve stenosis. Aortic valve mean gradient measures 7.0 mmHg. Aortic valve peak gradient measures 11.9 mmHg. Aortic valve area, by VTI measures 1.79 cm. LEFT VENTRICLE PLAX 2D LVIDd:         4.29 cm  Diastology LVIDs:         3.50 cm  LV e' medial:    5.33 cm/s LV PW:         1.20 cm  LV E/e' medial:  8.3 LV IVS:        1.27 cm  LV e' lateral:   7.83 cm/s LVOT diam:     1.90 cm  LV E/e' lateral: 5.6 LV SV:         55 LV SV Index:   37 LVOT Area:     2.84 cm  RIGHT VENTRICLE RV Basal diam:  3.32 cm RV S prime:     13.40 cm/s TAPSE (M-mode): 1.7 cm LEFT ATRIUM             Index       RIGHT ATRIUM           Index LA diam:        4.30 cm 2.90 cm/m  RA Area:     11.20 cm LA Vol (A2C):   44.0 ml 29.67 ml/m  RA Volume:   26.40 ml  17.80 ml/m LA Vol (A4C):   39.8 ml 26.84 ml/m LA Biplane Vol: 43.5 ml 29.34 ml/m  AORTIC VALVE AV Area (Vmax):  1.69 cm AV Area (Vmean):   1.59 cm AV Area (VTI):     1.79 cm AV Vmax:           172.33 cm/s AV Vmean:          127.333 cm/s AV VTI:            0.306 m AV Peak Grad:      11.9 mmHg AV Mean Grad:      7.0 mmHg LVOT Vmax:         103.00 cm/s LVOT Vmean:        71.400 cm/s LVOT VTI:          0.193 m LVOT/AV VTI ratio: 0.63  AORTA Ao Root diam: 3.00 cm MITRAL VALVE                TRICUSPID VALVE MV Area (PHT): 2.80 cm     TR Peak grad:   16.2 mmHg MV Decel Time: 271 msec     TR Vmax:        201.00 cm/s MV E velocity: 44.10 cm/s MV A velocity: 103.00 cm/s  SHUNTS MV E/A ratio:  0.43         Systemic VTI:  0.19 m                             Systemic Diam: 1.90 cm Kate Sable MD Electronically signed by Kate Sable MD Signature Date/Time: 07/18/2020/7:20:35 AM    Final     Microbiology: Recent Results (from the past 240 hour(s))  Gastrointestinal Panel by PCR , Stool     Status: None   Collection Time: 07/13/20 11:42 PM   Specimen: Stool  Result Value Ref Range Status   Campylobacter species NOT DETECTED NOT DETECTED Final   Plesimonas shigelloides NOT DETECTED NOT DETECTED Final   Salmonella species NOT DETECTED NOT DETECTED Final   Yersinia enterocolitica NOT DETECTED NOT DETECTED Final   Vibrio species NOT DETECTED NOT DETECTED Final   Vibrio cholerae NOT DETECTED NOT DETECTED Final   Enteroaggregative E coli (EAEC) NOT DETECTED NOT DETECTED Final   Enteropathogenic E coli (EPEC) NOT DETECTED NOT DETECTED Final   Enterotoxigenic E coli (ETEC) NOT DETECTED NOT DETECTED Final   Shiga like toxin producing E coli (STEC) NOT DETECTED NOT DETECTED Final   Shigella/Enteroinvasive E coli (EIEC) NOT DETECTED NOT DETECTED Final   Cryptosporidium NOT DETECTED NOT DETECTED Final   Cyclospora cayetanensis NOT DETECTED NOT DETECTED Final   Entamoeba  histolytica NOT DETECTED NOT DETECTED Final   Giardia lamblia NOT DETECTED NOT DETECTED Final   Adenovirus F40/41 NOT DETECTED NOT DETECTED Final   Astrovirus NOT DETECTED NOT DETECTED Final   Norovirus GI/GII NOT DETECTED NOT DETECTED Final   Rotavirus A NOT DETECTED NOT DETECTED Final   Sapovirus (I, II, IV, and V) NOT DETECTED NOT DETECTED Final    Comment: Performed at [facility], [address] [place], [place] [address]  C Difficile Quick Screen w PCR reflex     Status: None   Collection Time: 07/13/20 11:42 PM   Specimen: Stool  Result Value Ref Range Status   C Diff antigen NEGATIVE NEGATIVE Final   C Diff toxin NEGATIVE NEGATIVE Final   C Diff interpretation No C. difficile detected.  Final    Comment: Performed at [facility], [address] [place], [place] [address]  Resp Panel by RT-PCR (Flu A&B, Covid)  Status: None   Collection Time: 07/13/20 11:48 PM   Specimen: Nasopharyngeal(NP) swabs in vial transport medium  Result Value Ref Range Status   SARS Coronavirus 2 by RT PCR NEGATIVE NEGATIVE Final    Comment: (NOTE) SARS-CoV-2 target nucleic acids are NOT DETECTED.  The SARS-CoV-2 RNA is generally detectable in upper respiratory specimens during the acute phase of infection. The lowest concentration of SARS-CoV-2 viral copies this assay can detect is 138 copies/mL. A negative result does not preclude SARS-Cov-2 infection and should not be used as the sole basis for treatment or other patient management decisions. A negative result may occur with  improper specimen collection/handling, submission of specimen other than nasopharyngeal swab, presence of viral mutation(s) within the areas targeted by this assay, and inadequate number of viral copies(<138 copies/mL). A negative result must be combined with clinical observations, patient history, and epidemiological information. The expected result is Negative.  Fact Sheet for Patients:   EntrepreneurPulse.com.au  Fact Sheet for Healthcare Providers:  IncredibleEmployment.be  This test is no t yet approved or cleared by the [place] FDA and  has been authorized for detection and/or diagnosis of SARS-CoV-2 by FDA under an Emergency Use Authorization (EUA). This EUA will remain  in effect (meaning this test can be used) for the duration of the COVID-19 declaration under Section 564(b)(1) of the Act, 21 U.S.C.section 360bbb-3(b)(1), unless the authorization is terminated  or revoked sooner.       Influenza A by PCR NEGATIVE NEGATIVE Final   Influenza B by PCR NEGATIVE NEGATIVE Final    Comment: (NOTE) The Xpert Xpress SARS-CoV-2/FLU/RSV plus assay is intended as an aid in the diagnosis of influenza from Nasopharyngeal swab specimens and should not be used as a sole basis for treatment. Nasal washings and aspirates are unacceptable for Xpert Xpress SARS-CoV-2/FLU/RSV testing.  Fact Sheet for Patients: EntrepreneurPulse.com.au  Fact Sheet for Healthcare Providers: IncredibleEmployment.be  This test is not yet approved or cleared by the [place] FDA and has been authorized for detection and/or diagnosis of SARS-CoV-2 by FDA under an Emergency Use Authorization (EUA). This EUA will remain in effect (meaning this test can be used) for the duration of the COVID-19 declaration under Section 564(b)(1) of the Act, 21 U.S.C. section 360bbb-3(b)(1), unless the authorization is terminated or revoked.  Performed at [facility], [address] [place], [place] [address]   Resp Panel by RT-PCR (Flu A&B, Covid) Nasopharyngeal Swab     Status: None   Collection Time: 07/17/20  2:44 PM   Specimen: Nasopharyngeal Swab; Nasopharyngeal(NP) swabs in vial transport medium  Result Value Ref Range Status   SARS Coronavirus 2 by RT PCR NEGATIVE NEGATIVE Final    Comment: (NOTE) SARS-CoV-2  target nucleic acids are NOT DETECTED.  The SARS-CoV-2 RNA is generally detectable in upper respiratory specimens during the acute phase of infection. The lowest concentration of SARS-CoV-2 viral copies this assay can detect is 138 copies/mL. A negative result does not preclude SARS-Cov-2 infection and should not be used as the sole basis for treatment or other patient management decisions. A negative result may occur with  improper specimen collection/handling, submission of specimen other than nasopharyngeal swab, presence of viral mutation(s) within the areas targeted by this assay, and inadequate number of viral copies(<138 copies/mL). A negative result must be combined with clinical observations, patient history, and epidemiological information. The expected result is Negative.  Fact Sheet for Patients:  EntrepreneurPulse.com.au  Fact Sheet for Healthcare Providers:  IncredibleEmployment.be  This test  is no t yet approved or cleared by the [place] and  has been authorized for detection and/or diagnosis of SARS-CoV-2 by FDA under an Emergency Use Authorization (EUA). This EUA will remain  in effect (meaning this test can be used) for the duration of the COVID-19 declaration under Section 564(b)(1) of the Act, 21 U.S.C.section 360bbb-3(b)(1), unless the authorization is terminated  or revoked sooner.       Influenza A by PCR NEGATIVE NEGATIVE Final   Influenza B by PCR NEGATIVE NEGATIVE Final    Comment: (NOTE) The Xpert Xpress SARS-CoV-2/FLU/RSV plus assay is intended as an aid in the diagnosis of influenza from Nasopharyngeal swab specimens and should not be used as a sole basis for treatment. Nasal washings and aspirates are unacceptable for Xpert Xpress SARS-CoV-2/FLU/RSV testing.  Fact Sheet for Patients: EntrepreneurPulse.com.au  Fact Sheet for Healthcare  Providers: IncredibleEmployment.be  This test is not yet approved or cleared by the [place] FDA and has been authorized for detection and/or diagnosis of SARS-CoV-2 by FDA under an Emergency Use Authorization (EUA). This EUA will remain in effect (meaning this test can be used) for the duration of the COVID-19 declaration under Section 564(b)(1) of the Act, 21 U.S.C. section 360bbb-3(b)(1), unless the authorization is terminated or revoked.  Performed at [facility], [address] [place] [address], [place] [address]      Labs: Basic Metabolic Panel: Recent Labs  Lab 07/14/20 0223 07/17/20 1049 07/17/20 1502 07/18/20 0333 07/19/20 0413 07/20/20 0354  NA 141 139  --  139 141 140  K 4.8 3.6  --  3.6 3.6 3.9  CL 106 103  --  105 106 106  CO2 27 28  --  26 26 28   GLUCOSE 137* 113*  --  113* 107* 104*  BUN 16 21  --  22 13 15   CREATININE 0.82 0.87 0.74 0.66 0.65 0.86  CALCIUM 9.3 10.2  --  9.7 9.4 9.7  MG  --   --   --   --  1.7 1.8   Liver Function Tests: Recent Labs  Lab 07/13/20 2045 07/17/20 1049  AST 37 21  ALT 18 14  ALKPHOS 63 66  BILITOT 1.6* 0.9  PROT 6.5 6.5  ALBUMIN 3.9 3.9   Recent Labs  Lab 07/13/20 2045 07/17/20 1049  LIPASE 38 37   No results for input(s): AMMONIA in the last 168 hours. CBC: Recent Labs  Lab 07/13/20 2045 07/14/20 0223 07/15/20 0336 07/17/20 1049 07/18/20 0333 07/19/20 0413 07/20/20 0354  WBC 10.0   < > 4.0 5.0 4.9 4.1 3.8*  NEUTROABS 8.8*  --   --  3.6  --   --  2.3  HGB 15.0   < > 11.6* 14.2 12.0 12.7 11.9*  HCT 43.4   < > 34.1* 41.0 35.2* 37.8 34.5*  MCV 92.9   < > 92.7 92.8 94.9 96.9 94.8  PLT 103*   < > 71* 100* 98* 85* 81*   < > = values in this interval not displayed.   Cardiac Enzymes: No results for input(s): CKTOTAL, CKMB, CKMBINDEX, TROPONINI in the last 168 hours. BNP: BNP (last 3 results) No results for input(s): BNP in the last 8760 hours.  ProBNP (last 3 results) No  results for input(s): PROBNP in the last 8760 hours.  CBG: No results for input(s): GLUCAP in the last 168 hours.     Signed:  Florencia Reasons MD, PhD, FACP  [facility] 07/20/2020, 1:54 PM

## 2020-07-20 NOTE — Plan of Care (Signed)

## 2020-07-22 ENCOUNTER — Encounter: Payer: Self-pay | Admitting: Gastroenterology

## 2020-07-22 NOTE — Anesthesia Postprocedure Evaluation (Signed)
Anesthesia Post Note  Patient: Teresa Franklin  Procedure(s) Performed: COLONOSCOPY WITH PROPOFOL (N/A )  Patient location during evaluation: Endoscopy Anesthesia Type: General Level of consciousness: awake and alert Pain management: pain level controlled Vital Signs Assessment: post-procedure vital signs reviewed and stable Respiratory status: spontaneous breathing, nonlabored ventilation, respiratory function stable and patient connected to nasal cannula oxygen Cardiovascular status: blood pressure returned to baseline and stable Postop Assessment: no apparent nausea or vomiting Anesthetic complications: no   No complications documented.   Last Vitals:  Vitals:   07/20/20 0959 07/20/20 0959  BP: (!) 155/62 (!) 155/62  Pulse:  76  Resp:    Temp:  37.3 C  SpO2:  98%    Last Pain:  Vitals:   07/20/20 0959  TempSrc: Oral  PainSc:                  Arita Miss

## 2020-07-23 ENCOUNTER — Telehealth: Payer: Self-pay | Admitting: Primary Care

## 2020-07-23 LAB — SURGICAL PATHOLOGY

## 2020-07-23 NOTE — Telephone Encounter (Signed)
Spoke with patient's son-in-law, Cala Bradford [facility]) regarding the Palliative referral and he has requested that I call him back tomorrow, he would like to discuss this with his wife, Jan (pt's daughter).  Told him I would f/u with him tomorrow.

## 2020-07-23 NOTE — Progress Notes (Deleted)
Office Visit    Patient Name: Teresa Franklin Date of Encounter: 07/23/2020  Primary Care Provider:  [name], MD Primary Cardiologist:  [name], MD Electrophysiologist:  None   Chief Complaint    Teresa Franklin is a 87 y.o. female with a hx of CAD, CVA, HTN, HLD, HFrEF presents today for follow-up of CAD***  Past Medical History    Past Medical History:  Diagnosis Date  . Anxiety   . AV block    a. 08/2015 in setting of NSTEMI and RPAV intervention-->required temp wire but not PPM.  . Back pain   . Chronic combined systolic (congestive) and diastolic (congestive) heart failure ([place])    a. 08/2015  Echo: EF 55-60%, no rwma, mod MR, mildly dil LA/RA, mod TR, PASP 28mmHg; b. 05/2016 Echo: EF 30-35%, inflat/inf sev HK, Gr1 DD; c. 08/2017 Echo: EF 30-35%, mild LVH. Sev inf/infseptal HK. Gr1 DD. Mod MR. mildly dil LA. Mildly reduced RV fxn.  . Coronary artery disease    a. 08/2015 NSTEMI/PCI: LM nl, LAD 75p (med rx), 20 diffuse, 50d, LCX nl, OM1/2 nl, RCA 15m, RPAV 100 (2.25x12 Promus Premier DES) - complicated by 2:1 HB req temp wire post-PCI; b. 04/2016 MV: EF 34%, lateral HK/infarct. No ischemia.  . Diverticulitis   . Generalized anxiety disorder   . Hepatic steatosis   . HLD (hyperlipidemia)   . Hypertension   . Hypokalemia   . Hypomagnesemia   . Hypothyroidism   . Ischemic cardiomyopathy    a. 08/2015  Echo: EF 55-60%; b. 05/2016 Echo: EF 30-35%; c. 08/2017 Echo: EF 30-35%.  . OA (osteoarthritis)   . Osteopenia   . Sleep apnea   . Stroke [facility])    a. 08/2015 Right PCA territory infarct/temoral lobe cerebral infarction-->MRA showed severe flow liminting stenosis of R P2 cerebral artery;  b. 08/2015 Carotid U/S: 1-39% bilat ICA stenosis.  . Subdural hemorrhage ([place])    a. 05/2013 in setting of fall.  . Thrombocytopenia ([place])   . Weakness    Past Surgical History:  Procedure Laterality Date  . CARDIAC CATHETERIZATION N/A 09/08/2015   Procedure: Temporary Pacemaker;  Surgeon:  Leonie Man, MD;  Location: [place] [facility];  Service: Cardiovascular;  Laterality: N/A;  . CARDIAC CATHETERIZATION N/A 09/08/2015   Procedure: Left Heart Cath and Coronary Angiography;  Surgeon: Leonie Man, MD;  Location: [place] [facility];  Service: Cardiovascular;  Laterality: N/A;  . CARDIAC CATHETERIZATION N/A 09/08/2015   Procedure: Coronary Stent Intervention;  Surgeon: Leonie Man, MD;  Location: [place] [facility];  Service: Cardiovascular;  Laterality: N/A;  . CARDIAC CATHETERIZATION  09/08/2015   Procedure: Central Line Insertion;  Surgeon: Leonie Man, MD;  Location: [place] [facility];  Service: Cardiovascular;;  . CARDIAC CATHETERIZATION  09/08/2015   Procedure: Arterial Line Insertion;  Surgeon: Leonie Man, MD;  Location: [place] [facility];  Service: Cardiovascular;;  . CARDIAC CATHETERIZATION N/A 09/09/2015   Procedure: Temporary Wire;  Surgeon: Lorretta Harp, MD;  Location: [place] [facility];  Service: Cardiovascular;  Laterality: N/A;  . COLONOSCOPY WITH PROPOFOL N/A 07/19/2020   Procedure: COLONOSCOPY WITH PROPOFOL;  Surgeon: Lin Landsman, MD;  Location: [facility] ENDOSCOPY;  Service: Gastroenterology;  Laterality: N/A;  . ELBOW SURGERY    . LEFT HEART CATH AND CORONARY ANGIOGRAPHY N/A 09/22/2017   Procedure: LEFT HEART CATH AND CORONARY ANGIOGRAPHY;  Surgeon: [name], MD;  Location: [place] [facility];  Service: Cardiovascular;  Laterality: N/A;    Allergies  Allergies  Allergen Reactions  . Codeine Nausea And Vomiting  . Flomax [Tamsulosin] Other (See Comments)    Pt states that this medication gave her a kidney infection.    Lebron Quam [Hydrocodone-Acetaminophen] Nausea And Vomiting  . Sulfa Antibiotics Nausea And Vomiting  . Atorvastatin Other (See Comments)    Leg pain, leg weakness    History of Present Illness    Teresa Franklin is a 87 y.o. female with a hx of CAD, CVA, HTN, HLD, HFrEF, hypothyroidism, GERD chronic ITP last seen  02/07/20.   She presented in June 2017 with NSTEMI complicated by hypotension and high-grade AV block.  Cardiac catheterization showed occluded right posterior AV groove artery treated with PCI and DES.  She did not require PPM.  EF was normal at that time.  Also noted significant disease affecting proximal and mid LAD.  Recommended for medical therapy considering fetal status and involvement of the large diagonal branch at site of stenosis.  She also had CVA during the same hospitalization though no residual neurological deficits.  Due to worsening exertional dyspnea 2018 underwent nuclear stress test with no evidence of ischemia.  Evidence of prior infarct in lateral wall with EF 34%.  Followed by echo with LVEF 30-35% with severe hypokinesis of inferolateral and inferior wall with moderate mitral regurgitation no evidence of pulmonary hypertension.  LHC 09/2017 with widely patent RCA stent and no significant restenosis.  Moderate proximal to mid LAD disease estimated to be 60% which was actually improved compared to prior.  LVEDP normal.  She was seen 11/17/2018 by Dr. Fletcher Anon.  It was noted that her Coreg dose has been reduced due to hypotension.  She had also lost her husband since last seen and was continuing to grieve. Seen in follow up 01/2020 and doing overall well from cardiac perspective. No changes were made.   She was hospitalized 07/13/20-07/16/20 with abdominal pain and recommended for outpatient follow up. She presented 07/17/20 with similar presentation and repeat CT progressive pancolonic wall thickening consistent with nonspecific infectious or inflammatory colitis. She was treated with antibiotics, colonoscopy with no evidence of colitis and resection of colon polyps. She had mildly elevated troponin likely demand ischemia with echo showing LVEF 40-45%.   ***  EKGs/Labs/Other Studies Reviewed:   The following studies were reviewed today:  EKG:  EKG is ordered today.  The ekg ordered  today demonstrates NSR 75 bpm with no acute St/T wave changes. ***  Recent Labs: 07/14/2020: TSH 0.593 07/17/2020: ALT 14 07/20/2020: BUN 15; Creatinine, Ser 0.86; Hemoglobin 11.9; Magnesium 1.8; Platelets 81; Potassium 3.9; Sodium 140  Recent Lipid Panel    Component Value Date/Time   CHOL 114 09/09/2015 0350   TRIG 111 09/09/2015 0350   HDL 38 (L) 09/09/2015 0350   CHOLHDL 3.0 09/09/2015 0350   VLDL 22 09/09/2015 0350   LDLCALC 54 09/09/2015 0350    Home Medications   No outpatient medications have been marked as taking for the 07/24/20 encounter (Appointment) with Loel Dubonnet, NP.     Review of Systems  All other systems reviewed and are otherwise negative except as noted above. *** Physical Exam    VS:  There were no vitals taken for this visit. , BMI There is no height or weight on file to calculate BMI.  Wt Readings from Last 3 Encounters:  07/19/20 110 lb 0.2 oz (49.9 kg)  07/13/20 110 lb (49.9 kg)  06/04/20  105 lb 13.1 oz (48 kg)   *** GEN: Well nourished, well developed, in no acute distress. HEENT: normal. Neck: Supple, no JVD, carotid bruits, or masses. Cardiac: RRR, no murmurs, rubs, or gallops. No clubbing, cyanosis, edema.  Radials/DP/PT 2+ and equal bilaterally.  Respiratory:  Respirations regular and unlabored, clear to auscultation bilaterally. GI: Soft, nontender, nondistended. MS: No deformity or atrophy. Skin: Warm and dry, no rash. Neuro:  Strength and sensation are intact. Psych: Normal affect.  Assessment & Plan   *** 1. CAD-stable with no anginal symptoms.  Her angina equivalent is nausea.  GDMT includes aspirin, beta-blocker, statin.  Continue regular cardiovascular exercise.  2. Chronic systolic heart failure- Euvolemic and well compensated.  NYHA I-II. Recent echo LVEF 40-45%.  GDMT includes Coreg, lisinopril.  No indication for loop diuretic at this time.  Future considerations include addition of low-dose spironolactone.  3. HLD-  Continue to follow with primary care provider. Continue Atorvastatin 20mg  daily.   4. HTN- BP well controlled. Continue current antihypertensive regimen.   5. History of CVA - No residual defecits. Continue aspirin and statin.   Disposition: Follow up*** in 6 month(s) with Dr. Fletcher Anon or APP  Signed, Loel Dubonnet, NP 07/23/2020, 9:15 AM [place]

## 2020-07-24 ENCOUNTER — Ambulatory Visit: Payer: Medicare PPO | Admitting: Family

## 2020-07-24 ENCOUNTER — Encounter: Payer: Self-pay | Admitting: Gastroenterology

## 2020-07-26 ENCOUNTER — Telehealth: Payer: Self-pay | Admitting: Primary Care

## 2020-07-26 NOTE — Telephone Encounter (Signed)
Spoke with Cala Bradford, [facility]) and he said that he spoke with patient and family and they have declined Palliative services at this time.  He stated that this may change later and if it does they will revisit this at that time.  I will cancel the referral and notify the MD and Palliative Team.

## 2020-07-31 ENCOUNTER — Ambulatory Visit: Payer: Medicare PPO | Admitting: Gastroenterology

## 2020-07-31 ENCOUNTER — Encounter: Payer: Self-pay | Admitting: Gastroenterology

## 2020-07-31 ENCOUNTER — Other Ambulatory Visit: Payer: Self-pay

## 2020-07-31 VITALS — BP 134/78 | HR 73 | Temp 98.6°F | Ht 62.0 in | Wt 106.1 lb

## 2020-07-31 DIAGNOSIS — R1030 Lower abdominal pain, unspecified: Secondary | ICD-10-CM | POA: Diagnosis not present

## 2020-07-31 DIAGNOSIS — R140 Abdominal distension (gaseous): Secondary | ICD-10-CM | POA: Diagnosis not present

## 2020-07-31 MED ORDER — ONDANSETRON HCL 4 MG PO TABS: 4.0000 mg | ORAL_TABLET | Freq: Three times a day (TID) | ORAL | 0 refills | Status: AC | PRN

## 2020-07-31 NOTE — Progress Notes (Signed)
Cephas Darby, MD [address]  [place]  [place], [place] [address]  Main: [phone]  Fax: [phone]    Gastroenterology Consultation  Referring Provider:     [name], MD Primary Care Physician:  Henrietta Hoover, MD Primary Gastroenterologist:  Dr. Cephas Darby Reason for Consultation:     Hospital follow-up        HPI:   Teresa Franklin is a 87 y.o. female referred by Dr. Malvin Johns, Gershon Crane, MD  for consultation & management of recurrent lower abdominal pain.  Patient has history of coronary disease, hypertension, hypothyroidism, history of CVA he is functionally independent.  Was recently admitted to [facility] on 07/18/2020 secondary to recurrence of lower abdominal pain.  CT revealed diffuse colon wall thickening and predominantly in the transverse colon.  Her stool studies were negative for infection.  She was on antibiotics.  She underwent colonoscopy due to recurrent admission which was unremarkable except for sigmoid diverticulosis.  There was no evidence of inflammation in the colon except for 3 subcentimeter polyps.  Therefore, random colon biopsies were not performed.  Patient reports that she had another episode 3 days ago after she had boost with ice cream.  She felt nauseous, pain in her lower abdomen associated with bloating.  She had an episode of constipation yesterday with hard balls, therefore had MiraLAX, had good bowel movement today.  She does have some bloating.  She does acknowledge drinking sweet tea daily with artificial sweeteners, loves to eat cookies, also eats pudding daily, does have diet soda regularly.  She does consume dairy products regularly.  Her weight is stable.  Patient is accompanied by her son today.  Patient is also wondering if these episodes are secondary to anxiety because she will be moving with her son but lives separately in an apartment, new adjustment for her.  She is requesting antinausea medication  NSAIDs:  None  Antiplts/Anticoagulants/Anti thrombotics: Aspirin 81 mg daily  GI Procedures:  Colonoscopy 07/19/2020 - The examined portion of the ileum was normal. - Diverticulosis in the sigmoid colon. - Two 4 to 5 mm polyps in the descending colon and in the transverse colon, removed with a cold snare. Resected and retrieved. - One 8 mm polyp in the transverse colon, removed with a hot snare. Resected and retrieved. - The distal rectum and anal verge are normal on retroflexion view. - Normal mucosa in the entire examined colon, no evidence of colitis.  DIAGNOSIS:  A. COLON POLYP X 2, TRANSVERSE; COLD SNARE AND HOT SNARE:  - TUBULAR ADENOMA, NEGATIVE FOR HIGH-GRADE DYSPLASIA AND MALIGNANCY.  - SESSILE SERRATED POLYP, NEGATIVE FOR DYSPLASIA AND MALIGNANCY.   B. COLON POLYP, DESCENDING; COLD SNARE:  - TUBULAR ADENOMA.  - NEGATIVE FOR HIGH-GRADE DYSPLASIA AND MALIGNANCY.   Past Medical History:  Diagnosis Date  . Anxiety   . AV block    a. 08/2015 in setting of NSTEMI and RPAV intervention-->required temp wire but not PPM.  . Back pain   . Chronic combined systolic (congestive) and diastolic (congestive) heart failure ([place])    a. 08/2015  Echo: EF 55-60%, no rwma, mod MR, mildly dil LA/RA, mod TR, PASP 52mmHg; b. 05/2016 Echo: EF 30-35%, inflat/inf sev HK, Gr1 DD; c. 08/2017 Echo: EF 30-35%, mild LVH. Sev inf/infseptal HK. Gr1 DD. Mod MR. mildly dil LA. Mildly reduced RV fxn.  . Coronary artery disease    a. 08/2015 NSTEMI/PCI: LM nl, LAD 75p (med rx), 20 diffuse, 50d,  LCX nl, OM1/2 nl, RCA 59m, RPAV 100 (2.25x12 Promus Premier DES) - complicated by 2:1 HB req temp wire post-PCI; b. 04/2016 MV: EF 34%, lateral HK/infarct. No ischemia.  . Diverticulitis   . Generalized anxiety disorder   . Hepatic steatosis   . HLD (hyperlipidemia)   . Hypertension   . Hypokalemia   . Hypomagnesemia   . Hypothyroidism   . Ischemic cardiomyopathy    a. 08/2015  Echo: EF 55-60%; b. 05/2016 Echo: EF 30-35%; c.  08/2017 Echo: EF 30-35%.  . OA (osteoarthritis)   . Osteopenia   . Sleep apnea   . Stroke [facility])    a. 08/2015 Right PCA territory infarct/temoral lobe cerebral infarction-->MRA showed severe flow liminting stenosis of R P2 cerebral artery;  b. 08/2015 Carotid U/S: 1-39% bilat ICA stenosis.  . Subdural hemorrhage ([place])    a. 05/2013 in setting of fall.  . Thrombocytopenia ([place])   . Weakness     Past Surgical History:  Procedure Laterality Date  . CARDIAC CATHETERIZATION N/A 09/08/2015   Procedure: Temporary Pacemaker;  Surgeon: Leonie Man, MD;  Location: [place] [facility];  Service: Cardiovascular;  Laterality: N/A;  . CARDIAC CATHETERIZATION N/A 09/08/2015   Procedure: Left Heart Cath and Coronary Angiography;  Surgeon: Leonie Man, MD;  Location: [place] [facility];  Service: Cardiovascular;  Laterality: N/A;  . CARDIAC CATHETERIZATION N/A 09/08/2015   Procedure: Coronary Stent Intervention;  Surgeon: Leonie Man, MD;  Location: [place] [facility];  Service: Cardiovascular;  Laterality: N/A;  . CARDIAC CATHETERIZATION  09/08/2015   Procedure: Central Line Insertion;  Surgeon: Leonie Man, MD;  Location: [place] [facility];  Service: Cardiovascular;;  . CARDIAC CATHETERIZATION  09/08/2015   Procedure: Arterial Line Insertion;  Surgeon: Leonie Man, MD;  Location: [place] [facility];  Service: Cardiovascular;;  . CARDIAC CATHETERIZATION N/A 09/09/2015   Procedure: Temporary Wire;  Surgeon: Lorretta Harp, MD;  Location: [place] [facility];  Service: Cardiovascular;  Laterality: N/A;  . COLONOSCOPY WITH PROPOFOL N/A 07/19/2020   Procedure: COLONOSCOPY WITH PROPOFOL;  Surgeon: Lin Landsman, MD;  Location: [facility] ENDOSCOPY;  Service: Gastroenterology;  Laterality: N/A;  . ELBOW SURGERY    . LEFT HEART CATH AND CORONARY ANGIOGRAPHY N/A 09/22/2017   Procedure: LEFT HEART CATH AND CORONARY ANGIOGRAPHY;  Surgeon: [name], MD;  Location: [place] [facility];  Service:  Cardiovascular;  Laterality: N/A;    Current Outpatient Medications:  .  acetaminophen (TYLENOL) 325 MG tablet, Take 650 mg by mouth every 6 (six) hours as needed for mild pain, fever or headache. , Disp: , Rfl:  .  ALPRAZolam (XANAX) 0.25 MG tablet, Take 1 tablet (0.25 mg total) by mouth at bedtime as needed for anxiety., Disp: 15 tablet, Rfl: 0 .  aspirin 81 MG chewable tablet, Chew 1 tablet (81 mg total) by mouth daily., Disp: , Rfl:  .  carvedilol (COREG) 6.25 MG tablet, TAKE (1) TABLET BY MOUTH TWICE DAILY, Disp: 180 tablet, Rfl: 3 .  Coenzyme Q10 (CO Q 10 PO), Take by mouth daily., Disp: , Rfl:  .  folic acid-pyridoxine-cyancobalamin (FOLBIC) 2.5-25-2 MG TABS tablet, Take 1 tablet by mouth daily., Disp: , Rfl:  .  hydroxypropyl methylcellulose / hypromellose (ISOPTO TEARS / GONIOVISC) 2.5 % ophthalmic solution, Place 1 drop into both eyes 4 (four) times daily as needed for dry eyes., Disp: , Rfl:  .  levothyroxine (SYNTHROID, LEVOTHROID) 25 MCG tablet, Take 25 mcg  by mouth daily before breakfast. , Disp: , Rfl:  .  lisinopril (ZESTRIL) 30 MG tablet, Take by mouth., Disp: , Rfl:  .  Multiple Vitamins-Minerals (MULTIVITAMIN GUMMIES ADULT) CHEW, Chew 2 each by mouth at bedtime., Disp: , Rfl:  .  Multiple Vitamins-Minerals (PRESERVISION AREDS 2) CAPS, Take 1 capsule by mouth daily., Disp: , Rfl:  .  omeprazole (PRILOSEC) 20 MG capsule, Take 20 mg by mouth daily before breakfast. , Disp: , Rfl:  .  ondansetron (ZOFRAN) 4 MG tablet, Take 1 tablet (4 mg total) by mouth every 8 (eight) hours as needed for nausea or vomiting., Disp: 14 tablet, Rfl: 0 .  oxybutynin (DITROPAN) 5 MG tablet, Take 1 tablet by mouth 2 (two) times daily., Disp: , Rfl:  .  polyethylene glycol (MIRALAX / GLYCOLAX) packet, Take 17 g by mouth daily as needed for mild constipation., Disp: 14 each, Rfl: 0 .  Simethicone 80 MG TABS, Take 80 mg by mouth daily as needed (gas). , Disp: , Rfl:    Family History  Problem Relation  Age of Onset  . Hypertension Mother   . Hyperlipidemia Mother   . Stroke Mother   . Diabetes Sister   . Hypertension Sister   . Hyperlipidemia Sister   . Bipolar disorder Sister   . Hyperlipidemia Sister   . Stroke Sister   . Prostate cancer Brother   . Breast cancer Neg Hx      Social History   Tobacco Use  . Smoking status: Never Smoker  . Smokeless tobacco: Never Used  Vaping Use  . Vaping Use: Never used  Substance Use Topics  . Alcohol use: No    Alcohol/week: 0.0 standard drinks  . Drug use: No    Allergies as of 07/31/2020 - Review Complete 07/31/2020  Allergen Reaction Noted  . Codeine Nausea And Vomiting 09/05/2015  . Flomax [tamsulosin] Other (See Comments) 09/05/2015  . Norco [hydrocodone-acetaminophen] Nausea And Vomiting 09/05/2015  . Sulfa antibiotics Nausea And Vomiting 09/05/2015  . Atorvastatin Other (See Comments) 07/14/2020    Review of Systems:    All systems reviewed and negative except where noted in HPI.   Physical Exam:  BP 134/78 (BP Location: Left Arm, Patient Position: Sitting, Cuff Size: Normal)   Pulse 73   Temp 98.6 F (37 C) (Oral)   Ht 5\' 2"  (1.575 m)   Wt 106 lb 2 oz (48.1 kg)   BMI 19.41 kg/m  No LMP recorded. Patient is postmenopausal.  General:   Alert,  Well-developed, well-nourished, pleasant and cooperative in NAD Head:  Normocephalic and atraumatic. Eyes:  Sclera clear, no icterus.   Conjunctiva pink. Ears:  Normal auditory acuity. Nose:  No deformity, discharge, or lesions. Mouth:  No deformity or lesions,oropharynx pink & moist. Neck:  Supple; no masses or thyromegaly. Lungs:  Respirations even and unlabored.  Clear throughout to auscultation.   No wheezes, crackles, or rhonchi. No acute distress. Heart:  Regular rate and rhythm; no murmurs, clicks, rubs, or gallops. Abdomen:  Normal bowel sounds. Soft, non-tender and mildly distended, tympanic to percussion without masses, hepatosplenomegaly or hernias noted.  No  guarding or rebound tenderness.   Rectal: Not performed Msk:  Symmetrical without gross deformities. Good, equal movement & strength bilaterally. Pulses:  Normal pulses noted. Extremities:  No clubbing or edema.  No cyanosis. Neurologic:  Alert and oriented x3;  grossly normal neurologically. Skin:  Intact without significant lesions or rashes. No jaundice. Psych:  Alert and cooperative. Normal mood and  affect.  Imaging Studies: Reviewed  Assessment and Plan:   Teresa Franklin is a 87 y.o. female with history of coronary disease, hypertension, hyperlipidemia, history of CVA, who is functionally independent is seen as a hospital follow-up of lower abdominal pain and colon wall thickening on the CT.  Colonoscopy revealed sigmoid diverticulosis only otherwise normal mucosa.  Her abdominal pain and bloating are likely multifactorial, combination of anxiety, dairy products, intake of sweets/artificial sweeteners  Advised to maintain lactose-free diet for 2 weeks Maintain food diary Advised to cut back on sweets and sugary drinks Maintain regular bowel movements with MiraLAX daily Will prescribe Zofran 4 mg every 8 hours as needed for nausea  Follow up as needed   Cephas Darby, MD

## 2020-08-05 ENCOUNTER — Encounter (INDEPENDENT_AMBULATORY_CARE_PROVIDER_SITE_OTHER): Payer: Self-pay | Admitting: Vascular Surgery

## 2020-08-05 ENCOUNTER — Ambulatory Visit (INDEPENDENT_AMBULATORY_CARE_PROVIDER_SITE_OTHER): Payer: Medicare PPO | Admitting: Vascular Surgery

## 2020-08-05 ENCOUNTER — Other Ambulatory Visit: Payer: Self-pay

## 2020-08-05 VITALS — BP 155/72 | HR 64 | Resp 15 | Wt 104.0 lb

## 2020-08-05 DIAGNOSIS — I214 Non-ST elevation (NSTEMI) myocardial infarction: Secondary | ICD-10-CM | POA: Diagnosis not present

## 2020-08-05 DIAGNOSIS — K551 Chronic vascular disorders of intestine: Secondary | ICD-10-CM | POA: Diagnosis not present

## 2020-08-05 DIAGNOSIS — E785 Hyperlipidemia, unspecified: Secondary | ICD-10-CM | POA: Diagnosis not present

## 2020-08-05 DIAGNOSIS — I10 Essential (primary) hypertension: Secondary | ICD-10-CM | POA: Diagnosis not present

## 2020-08-05 NOTE — H&P (View-Only) (Signed)
MRN : [id number]  Teresa Franklin is a 87 y.o. (Oct 23, 1930) female who presents with chief complaint of  Chief Complaint  Patient presents with  . Follow-up    [facility] ED 3week follow up  .  History of Present Illness:   I am asked to evaluate the patient for the complaint of abdominal pain with uncertain etiology.  The patient is followed by GI who is concerned about mesenteric ischemia.  The patient has noted some weight loss as well as nausea.  The patient does substantiate food fear, particular foods do not seem to aggravate or alleviate the symptoms.  The patient denies bloody bowel movements or diarrhea.  The patient has a history of colonoscopy which was not diagnostic.  No history of peptic ulcer disease.   No prior peripheral angiograms or vascular interventions.  The patient denies amaurosis fugax or recent TIA symptoms. There are no recent neurological changes noted. The patient denies claudication symptoms or rest pain symptoms. The patient denies history of DVT, PE or superficial thrombophlebitis. The patient denies recent episodes of angina   I have personally reviewed the CT scan and it appears that the SMA has approximately 70% stenosis    Current Meds  Medication Sig  . acetaminophen (TYLENOL) 325 MG tablet Take 650 mg by mouth every 6 (six) hours as needed for mild pain, fever or headache.   . ALPRAZolam (XANAX) 0.25 MG tablet Take 1 tablet (0.25 mg total) by mouth at bedtime as needed for anxiety.  Marland Kitchen aspirin 81 MG chewable tablet Chew 1 tablet (81 mg total) by mouth daily.  . carvedilol (COREG) 6.25 MG tablet TAKE (1) TABLET BY MOUTH TWICE DAILY  . Coenzyme Q10 (CO Q 10 PO) Take by mouth daily.  . folic acid-pyridoxine-cyancobalamin (FOLBIC) 2.5-25-2 MG TABS tablet Take 1 tablet by mouth daily.  . hydroxypropyl methylcellulose / hypromellose (ISOPTO TEARS / GONIOVISC) 2.5 % ophthalmic solution Place 1 drop into both eyes 4 (four) times daily as needed for dry eyes.   Marland Kitchen levothyroxine (SYNTHROID, LEVOTHROID) 25 MCG tablet Take 25 mcg by mouth daily before breakfast.   . lisinopril (ZESTRIL) 30 MG tablet Take by mouth.  . Multiple Vitamins-Minerals (MULTIVITAMIN GUMMIES ADULT) CHEW Chew 2 each by mouth at bedtime.  . Multiple Vitamins-Minerals (PRESERVISION AREDS 2) CAPS Take 1 capsule by mouth daily.  Marland Kitchen omeprazole (PRILOSEC) 20 MG capsule Take 20 mg by mouth daily before breakfast.   . ondansetron (ZOFRAN) 4 MG tablet Take 1 tablet (4 mg total) by mouth every 8 (eight) hours as needed for nausea or vomiting.  Marland Kitchen oxybutynin (DITROPAN) 5 MG tablet Take 1 tablet by mouth 2 (two) times daily.  . polyethylene glycol (MIRALAX / GLYCOLAX) packet Take 17 g by mouth daily as needed for mild constipation.  . Simethicone 80 MG TABS Take 80 mg by mouth daily as needed (gas).     Past Medical History:  Diagnosis Date  . Acute colitis 07/13/2020  . Anxiety   . AV block    a. 08/2015 in setting of NSTEMI and RPAV intervention-->required temp wire but not PPM.  . Back pain   . Chronic combined systolic (congestive) and diastolic (congestive) heart failure (HCC)    a. 08/2015  Echo: EF 55-60%, no rwma, mod MR, mildly dil LA/RA, mod TR, PASP ; b. 05/2016 Echo: EF 30-35%, inflat/inf sev HK, Gr1 DD; c. 08/2017 Echo: EF 30-35%, mild LVH. Sev inf/infseptal HK. Gr1 DD. Mod MR. mildly dil LA. Mildly reduced  RV fxn.  . Colitis 07/17/2020  . Coronary artery disease    a. 08/2015 NSTEMI/PCI: LM nl, LAD 75p (med rx), 20 diffuse, 50d, LCX nl, OM1/2 nl, RCA 63m, RPAV 100 (2.25x12 Promus Premier DES) - complicated by 2:1 HB req temp wire post-PCI; b. 04/2016 MV: EF 34%, lateral HK/infarct. No ischemia.  . Diverticulitis   . Generalized anxiety disorder   . Hepatic steatosis   . HLD (hyperlipidemia)   . Hypertension   . Hypokalemia   . Hypomagnesemia   . Hypothyroidism   . Ischemic cardiomyopathy    a. 08/2015  Echo: EF 55-60%; b. 05/2016 Echo: EF 30-35%; c. 08/2017 Echo: EF 30-35%.   . OA (osteoarthritis)   . Osteopenia   . Sleep apnea   . Stroke [facility])    a. 08/2015 Right PCA territory infarct/temoral lobe cerebral infarction-->MRA showed severe flow liminting stenosis of R P2 cerebral artery;  b. 08/2015 Carotid U/S: 1-39% bilat ICA stenosis.  . Subdural hemorrhage ([place])    a. 05/2013 in setting of fall.  . Thrombocytopenia ([place])   . Weakness     Past Surgical History:  Procedure Laterality Date  . CARDIAC CATHETERIZATION N/A 09/08/2015   Procedure: Temporary Pacemaker;  Surgeon: Leonie Man, MD;  Location: [place] CV LAB;  Service: Cardiovascular;  Laterality: N/A;  . CARDIAC CATHETERIZATION N/A 09/08/2015   Procedure: Left Heart Cath and Coronary Angiography;  Surgeon: Leonie Man, MD;  Location: [place] [facility] LAB;  Service: Cardiovascular;  Laterality: N/A;  . CARDIAC CATHETERIZATION N/A 09/08/2015   Procedure: Coronary Stent Intervention;  Surgeon: Leonie Man, MD;  Location: [place] CV LAB;  Service: Cardiovascular;  Laterality: N/A;  . CARDIAC CATHETERIZATION  09/08/2015   Procedure: Central Line Insertion;  Surgeon: Leonie Man, MD;  Location: [place] [facility] LAB;  Service: Cardiovascular;;  . CARDIAC CATHETERIZATION  09/08/2015   Procedure: Arterial Line Insertion;  Surgeon: Leonie Man, MD;  Location: [place] CV LAB;  Service: Cardiovascular;;  . CARDIAC CATHETERIZATION N/A 09/09/2015   Procedure: Temporary Wire;  Surgeon: Lorretta Harp, MD;  Location: [place] CV LAB;  Service: Cardiovascular;  Laterality: N/A;  . COLONOSCOPY WITH PROPOFOL N/A 07/19/2020   Procedure: COLONOSCOPY WITH PROPOFOL;  Surgeon: Lin Landsman, MD;  Location: [facility] ENDOSCOPY;  Service: Gastroenterology;  Laterality: N/A;  . ELBOW SURGERY    . LEFT HEART CATH AND CORONARY ANGIOGRAPHY N/A 09/22/2017   Procedure: LEFT HEART CATH AND CORONARY ANGIOGRAPHY;  Surgeon: [name], MD;  Location: [place] [facility] LAB;  Service: Cardiovascular;   Laterality: N/A;    Social History Social History   Tobacco Use  . Smoking status: Never Smoker  . Smokeless tobacco: Never Used  Vaping Use  . Vaping Use: Never used  Substance Use Topics  . Alcohol use: No    Alcohol/week: 0.0 standard drinks  . Drug use: No    Family History Family History  Problem Relation Age of Onset  . Hypertension Mother   . Hyperlipidemia Mother   . Stroke Mother   . Diabetes Sister   . Hypertension Sister   . Hyperlipidemia Sister   . Bipolar disorder Sister   . Hyperlipidemia Sister   . Stroke Sister   . Prostate cancer Brother   . Breast cancer Neg Hx   No family history of bleeding/clotting disorders, porphyria or autoimmune disease   Allergies  Allergen Reactions  . Codeine Nausea And Vomiting  . Flomax [Tamsulosin] Other (See  Comments)    Pt states that this medication gave her a kidney infection.    Lebron Quam [Hydrocodone-Acetaminophen] Nausea And Vomiting  . Sulfa Antibiotics Nausea And Vomiting  . Atorvastatin Other (See Comments)    Leg pain, leg weakness     REVIEW OF SYSTEMS (Negative unless checked)  Constitutional: [] Weight loss  [] Fever  [] Chills Cardiac: [] Chest pain   [] Chest pressure   [] Palpitations   [] Shortness of breath when laying flat   [] Shortness of breath with exertion. Vascular:  [] Pain in legs with walking   [] Pain in legs at rest  [] History of DVT   [] Phlebitis   [] Swelling in legs   [] Varicose veins   [] Non-healing ulcers Pulmonary:   [] Uses home oxygen   [] Productive cough   [] Hemoptysis   [] Wheeze  [] COPD   [] Asthma Neurologic:  [] Dizziness   [] Seizures   [] History of stroke   [] History of TIA  [] Aphasia   [] Vissual changes   [] Weakness or numbness in arm   [] Weakness or numbness in leg Musculoskeletal:   [] Joint swelling   [] Joint pain   [] Low back pain Hematologic:  [] Easy bruising  [] Easy bleeding   [] Hypercoagulable state   [] Anemic Gastrointestinal:  [] Diarrhea   [] Vomiting  [] Gastroesophageal  reflux/heartburn   [] Difficulty swallowing. Genitourinary:  [] Chronic kidney disease   [] Difficult urination  [] Frequent urination   [] Blood in urine Skin:  [] Rashes   [] Ulcers  Psychological:  [] History of anxiety   []  History of major depression.  Physical Examination  Vitals:   08/05/20 1424  BP: (!) 155/72  Pulse: 64  Resp: 15  Weight: 104 lb (47.2 kg)   Body mass index is 19.02 kg/m. Gen: WD/WN, NAD Head: [place]/AT, No temporalis wasting.  Ear/Nose/Throat: Hearing grossly intact, nares w/o erythema or drainage, poor dentition Eyes: PER, EOMI, sclera nonicteric.  Neck: Supple, no masses.  No bruit or JVD.  Pulmonary:  Good air movement, clear to auscultation bilaterally, no use of accessory muscles.  Cardiac: RRR, normal S1, S2, no Murmurs. Vascular:  Vessel Right Left  Radial Palpable Palpable  Gastrointestinal: soft, non-distended. No guarding/no peritoneal signs.  Musculoskeletal: M/S 5/5 throughout.  No deformity or atrophy.  Neurologic: CN 2-12 intact. Pain and light touch intact in extremities.  Symmetrical.  Speech is fluent. Motor exam as listed above. Psychiatric: Judgment intact, Mood & affect appropriate for pt's clinical situation. Dermatologic: No rashes or ulcers noted.  No changes consistent with cellulitis.  CBC Lab Results  Component Value Date   WBC 3.8 (L) 07/20/2020   HGB 11.9 (L) 07/20/2020   HCT 34.5 (L) 07/20/2020   MCV 94.8 07/20/2020   PLT 81 (L) 07/20/2020    BMET    Component Value Date/Time   NA 140 07/20/2020 0354   NA 142 09/17/2017 1039   NA 136 03/26/2013 1808   K 3.9 07/20/2020 0354   K 4.2 03/26/2013 1808   CL 106 07/20/2020 0354   CL 100 03/26/2013 1808   CO2 28 07/20/2020 0354   CO2 30 03/26/2013 1808   GLUCOSE 104 (H) 07/20/2020 0354   GLUCOSE 104 (H) 03/26/2013 1808   BUN 15 07/20/2020 0354   BUN 13 09/17/2017 1039   BUN 15 03/26/2013 1808   CREATININE 0.86 07/20/2020 0354   CREATININE 0.67 03/26/2013 1808   CALCIUM  9.7 07/20/2020 0354   CALCIUM 10.8 (H) 03/26/2013 1808   GFRNONAA >60 07/20/2020 0354   GFRNONAA >60 03/26/2013 1808   GFRAA >60 12/06/2019 1304   GFRAA >60  03/26/2013 1808   Estimated Creatinine Clearance: 34.3 mL/min (by C-G formula based on SCr of 0.86 mg/dL).  COAG Lab Results  Component Value Date   INR 1.0 12/02/2018   INR 1.1 09/17/2017   INR 1.18 09/06/2015    Radiology CT Head Wo Contrast  Result Date: 07/13/2020 CLINICAL DATA:  Minor head trauma. EXAM: CT HEAD WITHOUT CONTRAST TECHNIQUE: Contiguous axial images were obtained from the base of the skull through the vertex without intravenous contrast. COMPARISON:  CT head 09/13/2015, MRI head 09/15/2015 FINDINGS: Brain: Patchy and confluent areas of decreased attenuation are noted throughout the deep and periventricular white matter of the cerebral hemispheres bilaterally, compatible with chronic microvascular ischemic disease. Similar-appearing right temporal lobe encephalomalacia. No evidence of large-territorial acute infarction. No parenchymal hemorrhage. No mass lesion. No extra-axial collection. No mass effect or midline shift. No hydrocephalus. Basilar cisterns are patent. Vascular: No hyperdense vessel. Skull: No acute fracture or focal lesion. Sinuses/Orbits: Paranasal sinuses and mastoid air cells are clear. The orbits are unremarkable. Other: None. IMPRESSION: No acute intracranial abnormality. Electronically Signed   By: Iven Finn M.D.   On: 07/13/2020 22:46   CT Abdomen Pelvis W Contrast  Result Date: 07/17/2020 CLINICAL DATA:  Abdominal pain EXAM: CT ABDOMEN AND PELVIS WITH CONTRAST TECHNIQUE: Multidetector CT imaging of the abdomen and pelvis was performed using the standard protocol following bolus administration of intravenous contrast. CONTRAST:  81mL OMNIPAQUE IOHEXOL 300 MG/ML  SOLN COMPARISON:  07/13/2020 FINDINGS: Lower chest: Left basilar scarring or atelectasis. Coronary artery calcification.  Hepatobiliary: No focal liver abnormality is seen. No gallstones, gallbladder wall thickening, or biliary dilatation. Pancreas: Unremarkable. No pancreatic ductal dilatation or surrounding inflammatory changes. Spleen: Mild splenomegaly. Unchanged subcentimeter hypodensities within the spleen. Adrenals/Urinary Tract: Unremarkable adrenal glands. Stable bilateral renal cysts. Suspicious renal lesion. No stone or hydronephrosis. Urinary bladder is largely obscured by streak artifact within the pelvis. Stomach/Bowel: Progressive pancolonic wall thickening most pronounced within the transverse colon (series 2, images 38-48). Scattered diverticulosis, most pronounced within the sigmoid colon. No dilated loops of bowel to suggest obstruction. Stomach appears within normal limits. Vascular/Lymphatic: Extensive atherosclerosis throughout the aortoiliac axis. No aneurysm. No large vessel occlusion evident on portal venous phase imaging. No abdominopelvic lymphadenopathy. Reproductive: No definite abnormality. Intrapelvic structures largely obscured by streak artifact. Other: No free fluid. No abdominopelvic fluid collection. No pneumoperitoneum. No abdominal wall hernia. Musculoskeletal: Previous right hip arthroplasty. Thoracolumbar dextroscoliotic curvature. Advanced thoracolumbar degenerative disc disease and degenerative facet arthropathy. No new or acute osseous findings. IMPRESSION: 1. Progressive pancolonic wall thickening most pronounced within the transverse colon, consistent with a nonspecific infectious or inflammatory colitis. 2. Sigmoid diverticulosis. 3. Mild splenomegaly. 4. Aortic atherosclerosis (ICD10-I70.0). Electronically Signed   By: Davina Poke D.O.   On: 07/17/2020 14:11   CT ABDOMEN PELVIS W CONTRAST  Result Date: 07/13/2020 CLINICAL DATA:  Abdominal distension. EXAM: CT ABDOMEN AND PELVIS WITH CONTRAST TECHNIQUE: Multidetector CT imaging of the abdomen and pelvis was performed using the  standard protocol following bolus administration of intravenous contrast. CONTRAST:  85mL OMNIPAQUE IOHEXOL 300 MG/ML  SOLN COMPARISON:  None. FINDINGS: Lower chest: Linear atelectasis versus scarring within the left lower lobe. Coronary artery calcifications. Hepatobiliary: No focal liver abnormality. No gallstones, gallbladder wall thickening, or pericholecystic fluid. No biliary dilatation. Pancreas: No focal lesion. Normal pancreatic contour. No surrounding inflammatory changes. No main pancreatic ductal dilatation. Spleen: Borderline enlarged spleen measuring up to 13 cm. Couple of subcentimeter hypodensities too small to characterize (2:24, 27). Adrenals/Urinary Tract:  No adrenal nodule bilaterally. Bilateral kidneys enhance symmetrically. Several subcentimeter hypodensities are too small to characterize. A 1.5 cm fluid density lesion within the right kidney likely represents a simple renal cyst. No hydronephrosis. No hydroureter. The urinary bladder is unremarkable. Stomach/Bowel: Stomach is within normal limits. No evidence of bowel wall thickening or dilatation. Fluid density within the lumen of the large bowel. Mild large bowel wall thickening and haziness of the distal transverse colon and left colon. Diffuse colonic diverticulosis. The appendix is not definitely identified. Vascular/Lymphatic: Main portal, splenic, superior mesenteric veins are patent. No abdominal aorta or iliac aneurysm. Severe calcified and noncalcified atherosclerotic plaque of the aorta and its branches. No abdominal, pelvic, or inguinal lymphadenopathy. Reproductive: Not well visualized due to streak artifact originating from the right femoral surgical hardware. Uterus and bilateral adnexal regions are grossly unremarkable. Other: Trace free fluid within the pelvis. No intraperitoneal free gas. No organized fluid collection. Musculoskeletal: No abdominal wall hernia or abnormality. No suspicious lytic or blastic osseous lesions. No  acute displaced fracture. Old healed right pelvic fracture. Multilevel degenerative changes of the spine in a patient with this extra scoliosis centered at the L2-L3 level. Total right hip arthroplasty partially visualized. IMPRESSION: 1. Colitis of the distal transverse colon and left colon. Differential diagnosis for etiology include ischemia, infection, inflammation. 2. Fast transition state. 3. Diffuse colonic diverticulosis with no acute diverticulitis. 4. Mild splenomegaly. 5.  Aortic Atherosclerosis (ICD10-I70.0). Electronically Signed   By: Iven Finn M.D.   On: 07/13/2020 23:07   ECHOCARDIOGRAM LIMITED  Result Date: 07/18/2020    ECHOCARDIOGRAM LIMITED REPORT   Patient Name:   KRISTYANA NOTTE Date of Exam: 07/17/2020 Medical Rec #:  [id number]    Height:       62.0 in Accession #:    [id number]   Weight:       110.0 lb Date of Birth:  10/01/1930   BSA:          1.483 m Patient Age:    54 years     BP:           168/77 mmHg Patient Gender: F            HR:           91 bpm. Exam Location:  [facility] Procedure: 2D Echo, Cardiac Doppler and Color Doppler Indications:     Elevated troponin  History:         Patient has prior history of Echocardiogram examinations, most                  recent 09/14/2017. Risk Factors:Hypertension and Dyslipidemia.                  Stroke. Sleep apnea. Ischemic cardiomyopathy. Hypothyroidism.                  Coronary artery disease. Chronic systolic heart failure.                  NSTEMI.  Sonographer:     Wilford Sports Rodgers-Jones Referring Phys:  [id number] [place] Diagnosing Phys: Kate Sable MD IMPRESSIONS  1. Left ventricular ejection fraction, by estimation, is 40 to 45%. The left ventricle has mild to moderately decreased function. The left ventricle demonstrates regional wall motion abnormalities (see scoring diagram/findings for description). There is  mild left ventricular hypertrophy. Left ventricular diastolic parameters are consistent with Grade I diastolic  dysfunction (impaired relaxation). There is severe hypokinesis of the left ventricular,  basal-mid inferolateral wall and inferior wall.  2. Mild mitral valve regurgitation.  3. The aortic valve was not well visualized. Aortic valve regurgitation is mild. Mild aortic valve sclerosis is present, with no evidence of aortic valve stenosis. FINDINGS  Left Ventricle: Left ventricular ejection fraction, by estimation, is 40 to 45%. The left ventricle has mild to moderately decreased function. The left ventricle demonstrates regional wall motion abnormalities. Severe hypokinesis of the left ventricular, basal-mid inferolateral wall and inferior wall. There is mild left ventricular hypertrophy. Left ventricular diastolic parameters are consistent with Grade I diastolic dysfunction (impaired relaxation). Left Atrium: Left atrial size was normal in size. Right Atrium: Right atrial size was normal in size. Mitral Valve: Mild mitral valve regurgitation. Aortic Valve: The aortic valve was not well visualized. Aortic valve regurgitation is mild. Mild aortic valve sclerosis is present, with no evidence of aortic valve stenosis. Aortic valve mean gradient measures 7.0 mmHg. Aortic valve peak gradient measures 11.9 mmHg. Aortic valve area, by VTI measures 1.79 cm. LEFT VENTRICLE PLAX 2D LVIDd:         4.29 cm  Diastology LVIDs:         3.50 cm  LV e' medial:    5.33 cm/s LV PW:         1.20 cm  LV E/e' medial:  8.3 LV IVS:        1.27 cm  LV e' lateral:   7.83 cm/s LVOT diam:     1.90 cm  LV E/e' lateral: 5.6 LV SV:         55 LV SV Index:   37 LVOT Area:     2.84 cm  RIGHT VENTRICLE RV Basal diam:  3.32 cm RV S prime:     13.40 cm/s TAPSE (M-mode): 1.7 cm LEFT ATRIUM             Index       RIGHT ATRIUM           Index LA diam:        4.30 cm 2.90 cm/m  RA Area:     11.20 cm LA Vol (A2C):   44.0 ml 29.67 ml/m RA Volume:   26.40 ml  17.80 ml/m LA Vol (A4C):   39.8 ml 26.84 ml/m LA Biplane Vol: 43.5 ml 29.34 ml/m  AORTIC VALVE  AV Area (Vmax):    1.69 cm AV Area (Vmean):   1.59 cm AV Area (VTI):     1.79 cm AV Vmax:           172.33 cm/s AV Vmean:          127.333 cm/s AV VTI:            0.306 m AV Peak Grad:      11.9 mmHg AV Mean Grad:      7.0 mmHg LVOT Vmax:         103.00 cm/s LVOT Vmean:        71.400 cm/s LVOT VTI:          0.193 m LVOT/AV VTI ratio: 0.63  AORTA Ao Root diam: 3.00 cm MITRAL VALVE                TRICUSPID VALVE MV Area (PHT): 2.80 cm     TR Peak grad:   16.2 mmHg MV Decel Time: 271 msec     TR Vmax:        201.00 cm/s MV E velocity: 44.10 cm/s MV A velocity: 103.00 cm/s  SHUNTS MV E/A ratio:  0.43         Systemic VTI:  0.19 m                             Systemic Diam: 1.90 cm Kate Sable MD Electronically signed by Kate Sable MD Signature Date/Time: 07/18/2020/7:20:35 AM    Final      Assessment/Plan 1. Superior mesenteric artery stenosis (HCC) Recommend:  The patient has evidence of severe atherosclerotic changes of the mesenteric arteries associated with weight loss as well as abdominal pain and N/V.  This represents a high risk for bowel infarction and death.  Patient should undergo angiography of the mesenteric arteries with the hope for intervention to eliminate the ischemic symptoms.   The risks and benefits as well as the alternative therapies was discussed in detail with the patient.  All questions were answered.  Patient agrees to proceed with angiography and intervention.  The patient will follow up with me after the angiogram.  2. NSTEMI (non-ST elevated myocardial infarction) ([place]) Continue cardiac and antihypertensive medications as already ordered and reviewed, no changes at this time.  Continue statin as ordered and reviewed, no changes at this time  Nitrates PRN for chest pain   3. Essential hypertension Continue antihypertensive medications as already ordered, these medications have been reviewed and there are no changes at this time.   4. Hyperlipidemia  with target LDL less than 70 Continue statin as ordered and reviewed, no changes at this time     [name], MD  08/05/2020 2:32 PM

## 2020-08-05 NOTE — Progress Notes (Signed)
  MRN : [id number]  Teresa Franklin is a 87 y.o. (08/22/1930) female who presents with chief complaint of  Chief Complaint  Patient presents with  . Follow-up    [facility] ED 3week follow up  .  History of Present Illness:   I am asked to evaluate the patient for the complaint of abdominal pain with uncertain etiology.  The patient is followed by GI who is concerned about mesenteric ischemia.  The patient has noted some weight loss as well as nausea.  The patient does substantiate food fear, particular foods do not seem to aggravate or alleviate the symptoms.  The patient denies bloody bowel movements or diarrhea.  The patient has a history of colonoscopy which was not diagnostic.  No history of peptic ulcer disease.   No prior peripheral angiograms or vascular interventions.  The patient denies amaurosis fugax or recent TIA symptoms. There are no recent neurological changes noted. The patient denies claudication symptoms or rest pain symptoms. The patient denies history of DVT, PE or superficial thrombophlebitis. The patient denies recent episodes of angina   I have personally reviewed the CT scan and it appears that the SMA has approximately 70% stenosis    Current Meds  Medication Sig  . acetaminophen (TYLENOL) 325 MG tablet Take 650 mg by mouth every 6 (six) hours as needed for mild pain, fever or headache.   . ALPRAZolam (XANAX) 0.25 MG tablet Take 1 tablet (0.25 mg total) by mouth at bedtime as needed for anxiety.  . aspirin 81 MG chewable tablet Chew 1 tablet (81 mg total) by mouth daily.  . carvedilol (COREG) 6.25 MG tablet TAKE (1) TABLET BY MOUTH TWICE DAILY  . Coenzyme Q10 (CO Q 10 PO) Take by mouth daily.  . folic acid-pyridoxine-cyancobalamin (FOLBIC) 2.5-25-2 MG TABS tablet Take 1 tablet by mouth daily.  . hydroxypropyl methylcellulose / hypromellose (ISOPTO TEARS / GONIOVISC) 2.5 % ophthalmic solution Place 1 drop into both eyes 4 (four) times daily as needed for dry eyes.   . levothyroxine (SYNTHROID, LEVOTHROID) 25 MCG tablet Take 25 mcg by mouth daily before breakfast.   . lisinopril (ZESTRIL) 30 MG tablet Take by mouth.  . Multiple Vitamins-Minerals (MULTIVITAMIN GUMMIES ADULT) CHEW Chew 2 each by mouth at bedtime.  . Multiple Vitamins-Minerals (PRESERVISION AREDS 2) CAPS Take 1 capsule by mouth daily.  . omeprazole (PRILOSEC) 20 MG capsule Take 20 mg by mouth daily before breakfast.   . ondansetron (ZOFRAN) 4 MG tablet Take 1 tablet (4 mg total) by mouth every 8 (eight) hours as needed for nausea or vomiting.  . oxybutynin (DITROPAN) 5 MG tablet Take 1 tablet by mouth 2 (two) times daily.  . polyethylene glycol (MIRALAX / GLYCOLAX) packet Take 17 g by mouth daily as needed for mild constipation.  . Simethicone 80 MG TABS Take 80 mg by mouth daily as needed (gas).     Past Medical History:  Diagnosis Date  . Acute colitis 07/13/2020  . Anxiety   . AV block    a. 08/2015 in setting of NSTEMI and RPAV intervention-->required temp wire but not PPM.  . Back pain   . Chronic combined systolic (congestive) and diastolic (congestive) heart failure (HCC)    a. 08/2015  Echo: EF 55-60%, no rwma, mod MR, mildly dil LA/RA, mod TR, PASP 58mmHg; b. 05/2016 Echo: EF 30-35%, inflat/inf sev HK, Gr1 DD; c. 08/2017 Echo: EF 30-35%, mild LVH. Sev inf/infseptal HK. Gr1 DD. Mod MR. mildly dil LA. Mildly reduced   RV fxn.  . Colitis 07/17/2020  . Coronary artery disease    a. 08/2015 NSTEMI/PCI: LM nl, LAD 75p (med rx), 20 diffuse, 50d, LCX nl, OM1/2 nl, RCA 63m, RPAV 100 (2.25x12 Promus Premier DES) - complicated by 2:1 HB req temp wire post-PCI; b. 04/2016 MV: EF 34%, lateral HK/infarct. No ischemia.  . Diverticulitis   . Generalized anxiety disorder   . Hepatic steatosis   . HLD (hyperlipidemia)   . Hypertension   . Hypokalemia   . Hypomagnesemia   . Hypothyroidism   . Ischemic cardiomyopathy    a. 08/2015  Echo: EF 55-60%; b. 05/2016 Echo: EF 30-35%; c. 08/2017 Echo: EF 30-35%.   . OA (osteoarthritis)   . Osteopenia   . Sleep apnea   . Stroke [facility])    a. 08/2015 Right PCA territory infarct/temoral lobe cerebral infarction-->MRA showed severe flow liminting stenosis of R P2 cerebral artery;  b. 08/2015 Carotid U/S: 1-39% bilat ICA stenosis.  . Subdural hemorrhage ([place])    a. 05/2013 in setting of fall.  . Thrombocytopenia ([place])   . Weakness     Past Surgical History:  Procedure Laterality Date  . CARDIAC CATHETERIZATION N/A 09/08/2015   Procedure: Temporary Pacemaker;  Surgeon: Leonie Man, MD;  Location: [place] [facility];  Service: Cardiovascular;  Laterality: N/A;  . CARDIAC CATHETERIZATION N/A 09/08/2015   Procedure: Left Heart Cath and Coronary Angiography;  Surgeon: Leonie Man, MD;  Location: [place] [facility];  Service: Cardiovascular;  Laterality: N/A;  . CARDIAC CATHETERIZATION N/A 09/08/2015   Procedure: Coronary Stent Intervention;  Surgeon: Leonie Man, MD;  Location: [place] [facility];  Service: Cardiovascular;  Laterality: N/A;  . CARDIAC CATHETERIZATION  09/08/2015   Procedure: Central Line Insertion;  Surgeon: Leonie Man, MD;  Location: [place] [facility];  Service: Cardiovascular;;  . CARDIAC CATHETERIZATION  09/08/2015   Procedure: Arterial Line Insertion;  Surgeon: Leonie Man, MD;  Location: [place] [facility];  Service: Cardiovascular;;  . CARDIAC CATHETERIZATION N/A 09/09/2015   Procedure: Temporary Wire;  Surgeon: Lorretta Harp, MD;  Location: [place] [facility];  Service: Cardiovascular;  Laterality: N/A;  . COLONOSCOPY WITH PROPOFOL N/A 07/19/2020   Procedure: COLONOSCOPY WITH PROPOFOL;  Surgeon: Lin Landsman, MD;  Location: [facility] ENDOSCOPY;  Service: Gastroenterology;  Laterality: N/A;  . ELBOW SURGERY    . LEFT HEART CATH AND CORONARY ANGIOGRAPHY N/A 09/22/2017   Procedure: LEFT HEART CATH AND CORONARY ANGIOGRAPHY;  Surgeon: [name], MD;  Location: [place] [facility];  Service: Cardiovascular;   Laterality: N/A;    Social History Social History   Tobacco Use  . Smoking status: Never Smoker  . Smokeless tobacco: Never Used  Vaping Use  . Vaping Use: Never used  Substance Use Topics  . Alcohol use: No    Alcohol/week: 0.0 standard drinks  . Drug use: No    Family History Family History  Problem Relation Age of Onset  . Hypertension Mother   . Hyperlipidemia Mother   . Stroke Mother   . Diabetes Sister   . Hypertension Sister   . Hyperlipidemia Sister   . Bipolar disorder Sister   . Hyperlipidemia Sister   . Stroke Sister   . Prostate cancer Brother   . Breast cancer Neg Hx   No family history of bleeding/clotting disorders, porphyria or autoimmune disease   Allergies  Allergen Reactions  . Codeine Nausea And Vomiting  . Flomax [Tamsulosin] Other (See  Comments)    Pt states that this medication gave her a kidney infection.    Lebron Quam [Hydrocodone-Acetaminophen] Nausea And Vomiting  . Sulfa Antibiotics Nausea And Vomiting  . Atorvastatin Other (See Comments)    Leg pain, leg weakness     REVIEW OF SYSTEMS (Negative unless checked)  Constitutional: [] Weight loss  [] Fever  [] Chills Cardiac: [] Chest pain   [] Chest pressure   [] Palpitations   [] Shortness of breath when laying flat   [] Shortness of breath with exertion. Vascular:  [] Pain in legs with walking   [] Pain in legs at rest  [] History of DVT   [] Phlebitis   [] Swelling in legs   [] Varicose veins   [] Non-healing ulcers Pulmonary:   [] Uses home oxygen   [] Productive cough   [] Hemoptysis   [] Wheeze  [] COPD   [] Asthma Neurologic:  [] Dizziness   [] Seizures   [] History of stroke   [] History of TIA  [] Aphasia   [] Vissual changes   [] Weakness or numbness in arm   [] Weakness or numbness in leg Musculoskeletal:   [] Joint swelling   [] Joint pain   [] Low back pain Hematologic:  [] Easy bruising  [] Easy bleeding   [] Hypercoagulable state   [] Anemic Gastrointestinal:  [] Diarrhea   [] Vomiting  [] Gastroesophageal  reflux/heartburn   [] Difficulty swallowing. Genitourinary:  [] Chronic kidney disease   [] Difficult urination  [] Frequent urination   [] Blood in urine Skin:  [] Rashes   [] Ulcers  Psychological:  [] History of anxiety   []  History of major depression.  Physical Examination  Vitals:   08/05/20 1424  BP: (!) 155/72  Pulse: 64  Resp: 15  Weight: 104 lb (47.2 kg)   Body mass index is 19.02 kg/m. Gen: WD/WN, NAD Head: /AT, No temporalis wasting.  Ear/Nose/Throat: Hearing grossly intact, nares w/o erythema or drainage, poor dentition Eyes: PER, EOMI, sclera nonicteric.  Neck: Supple, no masses.  No bruit or JVD.  Pulmonary:  Good air movement, clear to auscultation bilaterally, no use of accessory muscles.  Cardiac: RRR, normal S1, S2, no Murmurs. Vascular:  Vessel Right Left  Radial Palpable Palpable  Gastrointestinal: soft, non-distended. No guarding/no peritoneal signs.  Musculoskeletal: M/S 5/5 throughout.  No deformity or atrophy.  Neurologic: CN 2-12 intact. Pain and light touch intact in extremities.  Symmetrical.  Speech is fluent. Motor exam as listed above. Psychiatric: Judgment intact, Mood & affect appropriate for pt's clinical situation. Dermatologic: No rashes or ulcers noted.  No changes consistent with cellulitis.  CBC Lab Results  Component Value Date   WBC 3.8 (L) 07/20/2020   HGB 11.9 (L) 07/20/2020   HCT 34.5 (L) 07/20/2020   MCV 94.8 07/20/2020   PLT 81 (L) 07/20/2020    BMET    Component Value Date/Time   NA 140 07/20/2020 0354   NA 142 09/17/2017 1039   NA 136 03/26/2013 1808   K 3.9 07/20/2020 0354   K 4.2 03/26/2013 1808   CL 106 07/20/2020 0354   CL 100 03/26/2013 1808   CO2 28 07/20/2020 0354   CO2 30 03/26/2013 1808   GLUCOSE 104 (H) 07/20/2020 0354   GLUCOSE 104 (H) 03/26/2013 1808   BUN 15 07/20/2020 0354   BUN 13 09/17/2017 1039   BUN 15 03/26/2013 1808   CREATININE 0.86 07/20/2020 0354   CREATININE 0.67 03/26/2013 1808   CALCIUM  9.7 07/20/2020 0354   CALCIUM 10.8 (H) 03/26/2013 1808   GFRNONAA >60 07/20/2020 0354   GFRNONAA >60 03/26/2013 1808   GFRAA >60 12/06/2019 1304   GFRAA >60  03/26/2013 1808   Estimated Creatinine Clearance: 34.3 mL/min (by C-G formula based on SCr of 0.86 mg/dL).  COAG Lab Results  Component Value Date   INR 1.0 12/02/2018   INR 1.1 09/17/2017   INR 1.18 09/06/2015    Radiology CT Head Wo Contrast  Result Date: 07/13/2020 CLINICAL DATA:  Minor head trauma. EXAM: CT HEAD WITHOUT CONTRAST TECHNIQUE: Contiguous axial images were obtained from the base of the skull through the vertex without intravenous contrast. COMPARISON:  CT head 09/13/2015, MRI head 09/15/2015 FINDINGS: Brain: Patchy and confluent areas of decreased attenuation are noted throughout the deep and periventricular white matter of the cerebral hemispheres bilaterally, compatible with chronic microvascular ischemic disease. Similar-appearing right temporal lobe encephalomalacia. No evidence of large-territorial acute infarction. No parenchymal hemorrhage. No mass lesion. No extra-axial collection. No mass effect or midline shift. No hydrocephalus. Basilar cisterns are patent. Vascular: No hyperdense vessel. Skull: No acute fracture or focal lesion. Sinuses/Orbits: Paranasal sinuses and mastoid air cells are clear. The orbits are unremarkable. Other: None. IMPRESSION: No acute intracranial abnormality. Electronically Signed   By: Iven Finn M.D.   On: 07/13/2020 22:46   CT Abdomen Pelvis W Contrast  Result Date: 07/17/2020 CLINICAL DATA:  Abdominal pain EXAM: CT ABDOMEN AND PELVIS WITH CONTRAST TECHNIQUE: Multidetector CT imaging of the abdomen and pelvis was performed using the standard protocol following bolus administration of intravenous contrast. CONTRAST:  81mL OMNIPAQUE IOHEXOL 300 MG/ML  SOLN COMPARISON:  07/13/2020 FINDINGS: Lower chest: Left basilar scarring or atelectasis. Coronary artery calcification.  Hepatobiliary: No focal liver abnormality is seen. No gallstones, gallbladder wall thickening, or biliary dilatation. Pancreas: Unremarkable. No pancreatic ductal dilatation or surrounding inflammatory changes. Spleen: Mild splenomegaly. Unchanged subcentimeter hypodensities within the spleen. Adrenals/Urinary Tract: Unremarkable adrenal glands. Stable bilateral renal cysts. Suspicious renal lesion. No stone or hydronephrosis. Urinary bladder is largely obscured by streak artifact within the pelvis. Stomach/Bowel: Progressive pancolonic wall thickening most pronounced within the transverse colon (series 2, images 38-48). Scattered diverticulosis, most pronounced within the sigmoid colon. No dilated loops of bowel to suggest obstruction. Stomach appears within normal limits. Vascular/Lymphatic: Extensive atherosclerosis throughout the aortoiliac axis. No aneurysm. No large vessel occlusion evident on portal venous phase imaging. No abdominopelvic lymphadenopathy. Reproductive: No definite abnormality. Intrapelvic structures largely obscured by streak artifact. Other: No free fluid. No abdominopelvic fluid collection. No pneumoperitoneum. No abdominal wall hernia. Musculoskeletal: Previous right hip arthroplasty. Thoracolumbar dextroscoliotic curvature. Advanced thoracolumbar degenerative disc disease and degenerative facet arthropathy. No new or acute osseous findings. IMPRESSION: 1. Progressive pancolonic wall thickening most pronounced within the transverse colon, consistent with a nonspecific infectious or inflammatory colitis. 2. Sigmoid diverticulosis. 3. Mild splenomegaly. 4. Aortic atherosclerosis (ICD10-I70.0). Electronically Signed   By: Davina Poke D.O.   On: 07/17/2020 14:11   CT ABDOMEN PELVIS W CONTRAST  Result Date: 07/13/2020 CLINICAL DATA:  Abdominal distension. EXAM: CT ABDOMEN AND PELVIS WITH CONTRAST TECHNIQUE: Multidetector CT imaging of the abdomen and pelvis was performed using the  standard protocol following bolus administration of intravenous contrast. CONTRAST:  85mL OMNIPAQUE IOHEXOL 300 MG/ML  SOLN COMPARISON:  None. FINDINGS: Lower chest: Linear atelectasis versus scarring within the left lower lobe. Coronary artery calcifications. Hepatobiliary: No focal liver abnormality. No gallstones, gallbladder wall thickening, or pericholecystic fluid. No biliary dilatation. Pancreas: No focal lesion. Normal pancreatic contour. No surrounding inflammatory changes. No main pancreatic ductal dilatation. Spleen: Borderline enlarged spleen measuring up to 13 cm. Couple of subcentimeter hypodensities too small to characterize (2:24, 27). Adrenals/Urinary Tract:  No adrenal nodule bilaterally. Bilateral kidneys enhance symmetrically. Several subcentimeter hypodensities are too small to characterize. A 1.5 cm fluid density lesion within the right kidney likely represents a simple renal cyst. No hydronephrosis. No hydroureter. The urinary bladder is unremarkable. Stomach/Bowel: Stomach is within normal limits. No evidence of bowel wall thickening or dilatation. Fluid density within the lumen of the large bowel. Mild large bowel wall thickening and haziness of the distal transverse colon and left colon. Diffuse colonic diverticulosis. The appendix is not definitely identified. Vascular/Lymphatic: Main portal, splenic, superior mesenteric veins are patent. No abdominal aorta or iliac aneurysm. Severe calcified and noncalcified atherosclerotic plaque of the aorta and its branches. No abdominal, pelvic, or inguinal lymphadenopathy. Reproductive: Not well visualized due to streak artifact originating from the right femoral surgical hardware. Uterus and bilateral adnexal regions are grossly unremarkable. Other: Trace free fluid within the pelvis. No intraperitoneal free gas. No organized fluid collection. Musculoskeletal: No abdominal wall hernia or abnormality. No suspicious lytic or blastic osseous lesions. No  acute displaced fracture. Old healed right pelvic fracture. Multilevel degenerative changes of the spine in a patient with this extra scoliosis centered at the L2-L3 level. Total right hip arthroplasty partially visualized. IMPRESSION: 1. Colitis of the distal transverse colon and left colon. Differential diagnosis for etiology include ischemia, infection, inflammation. 2. Fast transition state. 3. Diffuse colonic diverticulosis with no acute diverticulitis. 4. Mild splenomegaly. 5.  Aortic Atherosclerosis (ICD10-I70.0). Electronically Signed   By: Iven Finn M.D.   On: 07/13/2020 23:07   ECHOCARDIOGRAM LIMITED  Result Date: 07/18/2020    ECHOCARDIOGRAM LIMITED REPORT   Patient Name:   KRISTYANA NOTTE Date of Exam: 07/17/2020 Medical Rec #:  [id number]    Height:       62.0 in Accession #:    [id number]   Weight:       110.0 lb Date of Birth:  10/01/1930   BSA:          1.483 m Patient Age:    54 years     BP:           168/77 mmHg Patient Gender: F            HR:           91 bpm. Exam Location:  [facility] Procedure: 2D Echo, Cardiac Doppler and Color Doppler Indications:     Elevated troponin  History:         Patient has prior history of Echocardiogram examinations, most                  recent 09/14/2017. Risk Factors:Hypertension and Dyslipidemia.                  Stroke. Sleep apnea. Ischemic cardiomyopathy. Hypothyroidism.                  Coronary artery disease. Chronic systolic heart failure.                  NSTEMI.  Sonographer:     Wilford Sports Rodgers-Jones Referring Phys:  [id number] [place] Diagnosing Phys: Kate Sable MD IMPRESSIONS  1. Left ventricular ejection fraction, by estimation, is 40 to 45%. The left ventricle has mild to moderately decreased function. The left ventricle demonstrates regional wall motion abnormalities (see scoring diagram/findings for description). There is  mild left ventricular hypertrophy. Left ventricular diastolic parameters are consistent with Grade I diastolic  dysfunction (impaired relaxation). There is severe hypokinesis of the left ventricular,  basal-mid inferolateral wall and inferior wall.  2. Mild mitral valve regurgitation.  3. The aortic valve was not well visualized. Aortic valve regurgitation is mild. Mild aortic valve sclerosis is present, with no evidence of aortic valve stenosis. FINDINGS  Left Ventricle: Left ventricular ejection fraction, by estimation, is 40 to 45%. The left ventricle has mild to moderately decreased function. The left ventricle demonstrates regional wall motion abnormalities. Severe hypokinesis of the left ventricular, basal-mid inferolateral wall and inferior wall. There is mild left ventricular hypertrophy. Left ventricular diastolic parameters are consistent with Grade I diastolic dysfunction (impaired relaxation). Left Atrium: Left atrial size was normal in size. Right Atrium: Right atrial size was normal in size. Mitral Valve: Mild mitral valve regurgitation. Aortic Valve: The aortic valve was not well visualized. Aortic valve regurgitation is mild. Mild aortic valve sclerosis is present, with no evidence of aortic valve stenosis. Aortic valve mean gradient measures 7.0 mmHg. Aortic valve peak gradient measures 11.9 mmHg. Aortic valve area, by VTI measures 1.79 cm. LEFT VENTRICLE PLAX 2D LVIDd:         4.29 cm  Diastology LVIDs:         3.50 cm  LV e' medial:    5.33 cm/s LV PW:         1.20 cm  LV E/e' medial:  8.3 LV IVS:        1.27 cm  LV e' lateral:   7.83 cm/s LVOT diam:     1.90 cm  LV E/e' lateral: 5.6 LV SV:         55 LV SV Index:   37 LVOT Area:     2.84 cm  RIGHT VENTRICLE RV Basal diam:  3.32 cm RV S prime:     13.40 cm/s TAPSE (M-mode): 1.7 cm LEFT ATRIUM             Index       RIGHT ATRIUM           Index LA diam:        4.30 cm 2.90 cm/m  RA Area:     11.20 cm LA Vol (A2C):   44.0 ml 29.67 ml/m RA Volume:   26.40 ml  17.80 ml/m LA Vol (A4C):   39.8 ml 26.84 ml/m LA Biplane Vol: 43.5 ml 29.34 ml/m  AORTIC VALVE  AV Area (Vmax):    1.69 cm AV Area (Vmean):   1.59 cm AV Area (VTI):     1.79 cm AV Vmax:           172.33 cm/s AV Vmean:          127.333 cm/s AV VTI:            0.306 m AV Peak Grad:      11.9 mmHg AV Mean Grad:      7.0 mmHg LVOT Vmax:         103.00 cm/s LVOT Vmean:        71.400 cm/s LVOT VTI:          0.193 m LVOT/AV VTI ratio: 0.63  AORTA Ao Root diam: 3.00 cm MITRAL VALVE                TRICUSPID VALVE MV Area (PHT): 2.80 cm     TR Peak grad:   16.2 mmHg MV Decel Time: 271 msec     TR Vmax:        201.00 cm/s MV E velocity: 44.10 cm/s MV A velocity: 103.00 cm/s  SHUNTS MV E/A ratio:  0.43         Systemic VTI:  0.19 m                             Systemic Diam: 1.90 cm Kate Sable MD Electronically signed by Kate Sable MD Signature Date/Time: 07/18/2020/7:20:35 AM    Final      Assessment/Plan 1. Superior mesenteric artery stenosis (HCC) Recommend:  The patient has evidence of severe atherosclerotic changes of the mesenteric arteries associated with weight loss as well as abdominal pain and N/V.  This represents a high risk for bowel infarction and death.  Patient should undergo angiography of the mesenteric arteries with the hope for intervention to eliminate the ischemic symptoms.   The risks and benefits as well as the alternative therapies was discussed in detail with the patient.  All questions were answered.  Patient agrees to proceed with angiography and intervention.  The patient will follow up with me after the angiogram.  2. NSTEMI (non-ST elevated myocardial infarction) ([place]) Continue cardiac and antihypertensive medications as already ordered and reviewed, no changes at this time.  Continue statin as ordered and reviewed, no changes at this time  Nitrates PRN for chest pain   3. Essential hypertension Continue antihypertensive medications as already ordered, these medications have been reviewed and there are no changes at this time.   4. Hyperlipidemia  with target LDL less than 70 Continue statin as ordered and reviewed, no changes at this time     [name], MD  08/05/2020 2:32 PM

## 2020-08-06 ENCOUNTER — Ambulatory Visit: Payer: Medicare PPO | Admitting: Family

## 2020-08-06 ENCOUNTER — Encounter: Payer: Self-pay | Admitting: Family

## 2020-08-06 ENCOUNTER — Ambulatory Visit: Payer: Medicare PPO | Admitting: Cardiovascular Disease

## 2020-08-06 ENCOUNTER — Telehealth (INDEPENDENT_AMBULATORY_CARE_PROVIDER_SITE_OTHER): Payer: Self-pay

## 2020-08-06 ENCOUNTER — Encounter (INDEPENDENT_AMBULATORY_CARE_PROVIDER_SITE_OTHER): Payer: Self-pay | Admitting: Vascular Surgery

## 2020-08-06 ENCOUNTER — Other Ambulatory Visit (INDEPENDENT_AMBULATORY_CARE_PROVIDER_SITE_OTHER): Payer: Self-pay | Admitting: Nurse Practitioner

## 2020-08-06 VITALS — BP 108/60 | HR 70 | Ht 62.0 in | Wt 105.2 lb

## 2020-08-06 DIAGNOSIS — Z01810 Encounter for preprocedural cardiovascular examination: Secondary | ICD-10-CM

## 2020-08-06 DIAGNOSIS — I25118 Atherosclerotic heart disease of native coronary artery with other forms of angina pectoris: Secondary | ICD-10-CM

## 2020-08-06 DIAGNOSIS — I10 Essential (primary) hypertension: Secondary | ICD-10-CM | POA: Diagnosis not present

## 2020-08-06 DIAGNOSIS — I5022 Chronic systolic (congestive) heart failure: Secondary | ICD-10-CM | POA: Diagnosis not present

## 2020-08-06 DIAGNOSIS — E785 Hyperlipidemia, unspecified: Secondary | ICD-10-CM

## 2020-08-06 DIAGNOSIS — Z8673 Personal history of transient ischemic attack (TIA), and cerebral infarction without residual deficits: Secondary | ICD-10-CM

## 2020-08-06 DIAGNOSIS — E782 Mixed hyperlipidemia: Secondary | ICD-10-CM | POA: Diagnosis not present

## 2020-08-06 MED ORDER — ATORVASTATIN CALCIUM 20 MG PO TABS: 20.0000 mg | ORAL_TABLET | Freq: Every day | ORAL | 1 refills | Status: AC

## 2020-08-06 NOTE — Telephone Encounter (Signed)
Spoke with the patient regarding her SMA stent placement with Dr. Delana Meyer on 08/07/20 with a 10:00 am arrival time to the [facility]. Pre-procedure instructions were discussed and per the patient she understood. Patient seemed drowsy so I asked if she wanted to call back later and she stated she was awake.

## 2020-08-06 NOTE — Patient Instructions (Addendum)
Medication Instructions:  Your physician has recommended you make the following change in your medication:   RESUME Atorvastatin 20mg  daily  *If you need a refill on your cardiac medications before your next appointment, please call your pharmacy*  Lab Work: None ordered today.   Testing/Procedures: Your EKG today was stable compared to previous. This is a good result!  Follow-Up: At [facility], you and your health needs are our priority.  As part of our continuing mission to provide you with exceptional heart care, we have created designated Provider Care Teams.  These Care Teams include your primary Cardiologist (physician) and Advanced Practice Providers (APPs -  Physician Assistants and Nurse Practitioners) who all work together to provide you with the care you need, when you need it.  We recommend signing up for the patient portal called "MyChart".  Sign up information is provided on this After Visit Summary.  MyChart is used to connect with patients for Virtual Visits (Telemedicine).  Patients are able to view lab/test results, encounter notes, upcoming appointments, etc.  Non-urgent messages can be sent to your provider as well.   To learn more about what you can do with MyChart, go to [email or web address].    Your next appointment:   3-4 month(s)  The format for your next appointment:   In Person  Provider:   You may see [name], MD or one of the following Advanced Practice Providers on your designated Care Team:    Murray Hodgkins, NP  Christell Faith, PA-C  Marrianne Mood, PA-C  Cadence Rawson, [place]  Laurann Montana, NP  Other Instructions  Loel Dubonnet, NP will send a note to Dr. Delana Meyer that you are cleared for your procedure.  Heart Healthy Diet Recommendations: A low-salt diet is recommended. Meats should be grilled, baked, or boiled. Avoid fried foods. Focus on lean protein sources like fish or chicken with vegetables and fruits. The American  Heart Association is a Microbiologist!  American Heart Association Diet and Lifeystyle Recommendations   Exercise recommendations: The American Heart Association recommends 150 minutes of moderate intensity exercise weekly. Try 30 minutes of moderate intensity exercise 4-5 times per week. This could include walking, jogging, or swimming.  At your next appointment we might talk about a medication called Zetia to add to your cholesterol medications. Additional information is listed below:  Ezetimibe Tablets What is this medicine? EZETIMIBE (ez ET i mibe) treats high cholesterol. Ezetimibe blocks the absorption of cholesterol from the stomach. It is used with lifestyle changes, like diet and exercise. It may be used alone or with other medicines. This medicine may be used for other purposes; ask your health care provider or pharmacist if you have questions. COMMON BRAND NAME(S): Zetia What should I tell my health care provider before I take this medicine? They need to know if you have any of these conditions:  kidney disease  liver disease  muscle cramps, pain  muscle injury  thyroid disease  an unusual or allergic reaction to ezetimibe, other medicines, foods, dyes, or preservatives  pregnant or trying to get pregnant  breast-feeding How should I use this medicine? Take this medicine by mouth. Take it as directed on the prescription label at the same time every day. You can take it with or without food. If it upsets your stomach, take it with food. Keep taking it unless your health care provider tells you to stop. Take bile acid sequestrants at a different time of day than this medicine. Take this medicine  2 hours BEFORE or 4 hours AFTER bile acid sequestrants. Talk to your health care provider about the use of this medicine in children. While it may be prescribed for children as young as 10 for selected conditions, precautions do apply. Overdosage: If you think you have taken too  much of this medicine contact a poison control center or emergency room at once. NOTE: This medicine is only for you. Do not share this medicine with others. What if I miss a dose? If you miss a dose, take it as soon as you can. If it is almost time for your next dose, take only that dose. Do not take double or extra doses. What may interact with this medicine? Do not take this medicine with any of the following medications:  fenofibrate  gemfibrozil This medicine may also interact with the following medications:  antacids  cyclosporine  herbal medicines like red yeast rice  other medicines to lower cholesterol or triglycerides This list may not describe all possible interactions. Give your health care provider a list of all the medicines, herbs, non-prescription drugs, or dietary supplements you use. Also tell them if you smoke, drink alcohol, or use illegal drugs. Some items may interact with your medicine. What should I watch for while using this medicine? Visit your health care provider for regular checks on your progress. Tell your health care provider if your symptoms do not start to get better or if they get worse. Your health care provider may tell you to stop taking this medicine if you develop muscle problems. If your muscle problems do not go away after stopping this medicine, contact your health care provider. Do not become pregnant while taking this medicine. Women should inform their health care provider if they wish to become pregnant or think they might be pregnant. There is potential for serious harm to an unborn child. Talk to your health care provider for more information. Do not breast-feed an infant while taking this medicine. Taking this medicine is only part of a total heart healthy program. Your health care provider may give you a special diet to follow. Avoid alcohol. Avoid smoking. Ask your health care provider how much you should exercise. Where should I keep my  medicine? Keep out of the reach of children and pets. Store at room temperature between 15 and 30 degrees C (59 and 86 degrees F). Protect from moisture. Get rid of any unused medicine after the expiration date. NOTE: This sheet is a summary. It may not cover all possible information. If you have questions about this medicine, talk to your doctor, pharmacist, or health care provider.  2021 Elsevier/Gold Standard (2019-02-15 17:28:51)

## 2020-08-06 NOTE — Progress Notes (Signed)
Office Visit    Patient Name: Teresa Franklin Date of Encounter: 08/06/2020  Primary Care Provider:  Henrietta Hoover, MD Primary Cardiologist:  [name], MD Electrophysiologist:  None   Chief Complaint    Teresa Franklin Befort is a 87 y.o. female with a hx of CAD, CVA, HTN, HLD, HFrEF presents today for follow-up of CAD and preoperative cardiovascular clearance  Past Medical History    Past Medical History:  Diagnosis Date  . Acute colitis 07/13/2020  . Anxiety   . AV block    a. 08/2015 in setting of NSTEMI and RPAV intervention-->required temp wire but not PPM.  . Back pain   . Chronic combined systolic (congestive) and diastolic (congestive) heart failure ([place])    a. 08/2015  Echo: EF 55-60%, no rwma, mod MR, mildly dil LA/RA, mod TR, PASP 71mmHg; b. 05/2016 Echo: EF 30-35%, inflat/inf sev HK, Gr1 DD; c. 08/2017 Echo: EF 30-35%, mild LVH. Sev inf/infseptal HK. Gr1 DD. Mod MR. mildly dil LA. Mildly reduced RV fxn.  . Colitis 07/17/2020  . Coronary artery disease    a. 08/2015 NSTEMI/PCI: LM nl, LAD 75p (med rx), 20 diffuse, 50d, LCX nl, OM1/2 nl, RCA 49m, RPAV 100 (2.25x12 Promus Premier DES) - complicated by 2:1 HB req temp wire post-PCI; b. 04/2016 MV: EF 34%, lateral HK/infarct. No ischemia.  . Diverticulitis   . Generalized anxiety disorder   . Hepatic steatosis   . HLD (hyperlipidemia)   . Hypertension   . Hypokalemia   . Hypomagnesemia   . Hypothyroidism   . Ischemic cardiomyopathy    a. 08/2015  Echo: EF 55-60%; b. 05/2016 Echo: EF 30-35%; c. 08/2017 Echo: EF 30-35%.  . OA (osteoarthritis)   . Osteopenia   . Sleep apnea   . Stroke [facility])    a. 08/2015 Right PCA territory infarct/temoral lobe cerebral infarction-->MRA showed severe flow liminting stenosis of R P2 cerebral artery;  b. 08/2015 Carotid U/S: 1-39% bilat ICA stenosis.  . Subdural hemorrhage ([place])    a. 05/2013 in setting of fall.  . Thrombocytopenia ([place])   . Weakness    Past Surgical History:  Procedure  Laterality Date  . CARDIAC CATHETERIZATION N/A 09/08/2015   Procedure: Temporary Pacemaker;  Surgeon: Leonie Man, MD;  Location: [place] [facility];  Service: Cardiovascular;  Laterality: N/A;  . CARDIAC CATHETERIZATION N/A 09/08/2015   Procedure: Left Heart Cath and Coronary Angiography;  Surgeon: Leonie Man, MD;  Location: [place] [facility];  Service: Cardiovascular;  Laterality: N/A;  . CARDIAC CATHETERIZATION N/A 09/08/2015   Procedure: Coronary Stent Intervention;  Surgeon: Leonie Man, MD;  Location: [place] [facility];  Service: Cardiovascular;  Laterality: N/A;  . CARDIAC CATHETERIZATION  09/08/2015   Procedure: Central Line Insertion;  Surgeon: Leonie Man, MD;  Location: [place] [facility];  Service: Cardiovascular;;  . CARDIAC CATHETERIZATION  09/08/2015   Procedure: Arterial Line Insertion;  Surgeon: Leonie Man, MD;  Location: [place] [facility];  Service: Cardiovascular;;  . CARDIAC CATHETERIZATION N/A 09/09/2015   Procedure: Temporary Wire;  Surgeon: Lorretta Harp, MD;  Location: [place] [facility];  Service: Cardiovascular;  Laterality: N/A;  . COLONOSCOPY WITH PROPOFOL N/A 07/19/2020   Procedure: COLONOSCOPY WITH PROPOFOL;  Surgeon: Lin Landsman, MD;  Location: [facility];  Service: Gastroenterology;  Laterality: N/A;  . ELBOW SURGERY    . LEFT HEART CATH AND CORONARY ANGIOGRAPHY N/A 09/22/2017   Procedure: LEFT HEART CATH  AND CORONARY ANGIOGRAPHY;  Surgeon: [name], MD;  Location: [place] [facility];  Service: Cardiovascular;  Laterality: N/A;    Allergies  Allergies  Allergen Reactions  . Codeine Nausea And Vomiting  . Flomax [Tamsulosin] Other (See Comments)    Pt states that this medication gave her a kidney infection.    . Lactose Intolerance (Gi) Other (See Comments)    Bladder problem  . Norco [Hydrocodone-Acetaminophen] Nausea And Vomiting  . Sulfa Antibiotics Nausea And Vomiting  . Atorvastatin Other (See Comments)    Leg  pain, leg weakness    History of Present Illness    Teresa Franklin is a 87 y.o. female with a hx of CAD, CVA, HTN, HLD, HFrEF, hypothyroidism, GERD chronic ITP last seen 02/07/20.   She presented in June 2017 with NSTEMI complicated by hypotension and high-grade AV block.  Cardiac catheterization showed occluded right posterior AV groove artery treated with PCI and DES.  She did not require PPM.  EF was normal at that time.  Also noted significant disease affecting proximal and mid LAD.  Recommended for medical therapy considering fetal status and involvement of the large diagonal branch at site of stenosis.  She also had CVA during the same hospitalization though no residual neurological deficits.  Due to worsening exertional dyspnea 2018 underwent nuclear stress test with no evidence of ischemia.  Evidence of prior infarct in lateral wall with EF 34%.  Followed by echo with LVEF 30-35% with severe hypokinesis of inferolateral and inferior wall with moderate mitral regurgitation no evidence of pulmonary hypertension.  LHC 09/2017 with widely patent RCA stent and no significant restenosis.  Moderate proximal to mid LAD disease estimated to be 60% which was actually improved compared to prior.  LVEDP normal.  She was seen 11/17/2018 by Dr. Fletcher Anon.  It was noted that her Coreg dose has been reduced due to hypotension.  She had also lost her husband since last seen and was continuing to grieve. Seen in follow up 01/2020 and doing overall well from cardiac perspective. No changes were made.   She was hospitalized 07/13/20-07/16/20 with abdominal pain and recommended for outpatient follow up. She presented 07/17/20 with similar presentation and repeat CT progressive pancolonic wall thickening consistent with nonspecific infectious or inflammatory colitis. She was treated with antibiotics, colonoscopy with no evidence of colitis and resection of colon polyps. She had mildly elevated troponin likely demand ischemia  with echo showing LVEF 40-45%.   Seen by Dr. Delana Meyer of vascular surgery yesterday due to abdominal pain and concern for mesenteric ischemia.  Per his review her CT showed SMA with approximately 70% stenosis.  She is scheduled for visceral angiography tomorrow.  She presents today for follow-up with her son Cherlynn Kaiser. she endorses the last 2 years have been difficult since the passing of her husband, adjusting to living with her son, and her more recent abdominal pain.  She is hopeful that her procedure with Dr. Delana Meyer tomorrow will provide answers and some relief.  She reports no chest pain, pressure, tightness.  She reports no shortness of breath at rest nor dyspnea on exertion.  Denies edema, orthopnea, PND.  No lightheadedness, dizziness, near-syncope, syncope.  She is rather deconditioned after her multiple hospitalizations over the last couple of months but continues to predominantly care for herself and is able to walk up a flight of stairs.  We discussed atorvastatin which was discontinued during previous admission due to concern for leg weakness. Her leg weakness has been  overall the same since being off Atorvastatin and we discussed that it is likely related to deconditioning and/or PAD.  EKGs/Labs/Other Studies Reviewed:   The following studies were reviewed today:  EKG:  EKG is ordered today.  The ekg ordered today demonstrates NSR 70 bpm with no acute St/T wave changes.  Nonspecific ST and T wave abnormality likely due to low voltage  Recent Labs: 07/14/2020: TSH 0.593 07/17/2020: ALT 14 07/20/2020: BUN 15; Creatinine, Ser 0.86; Hemoglobin 11.9; Magnesium 1.8; Platelets 81; Potassium 3.9; Sodium 140  Recent Lipid Panel    Component Value Date/Time   CHOL 114 09/09/2015 0350   TRIG 111 09/09/2015 0350   HDL 38 (L) 09/09/2015 0350   CHOLHDL 3.0 09/09/2015 0350   VLDL 22 09/09/2015 0350   LDLCALC 54 09/09/2015 0350    Home Medications   Current Meds  Medication Sig  . acetaminophen  (TYLENOL) 325 MG tablet Take 650 mg by mouth every 6 (six) hours as needed for mild pain, fever or headache.   . ALPRAZolam (XANAX) 0.25 MG tablet Take 1 tablet (0.25 mg total) by mouth at bedtime as needed for anxiety.  Marland Kitchen aspirin 81 MG chewable tablet Chew 1 tablet (81 mg total) by mouth daily.  Marland Kitchen atorvastatin (LIPITOR) 20 MG tablet Take 1 tablet (20 mg total) by mouth daily.  . carvedilol (COREG) 6.25 MG tablet TAKE (1) TABLET BY MOUTH TWICE DAILY  . folic acid-pyridoxine-cyancobalamin (FOLBIC) 2.5-25-2 MG TABS tablet Take 1 tablet by mouth daily.  . hydroxypropyl methylcellulose / hypromellose (ISOPTO TEARS / GONIOVISC) 2.5 % ophthalmic solution Place 1 drop into both eyes 4 (four) times daily as needed for dry eyes.  Marland Kitchen levothyroxine (SYNTHROID, LEVOTHROID) 25 MCG tablet Take 25 mcg by mouth daily before breakfast.   . lisinopril (ZESTRIL) 30 MG tablet Take 30 mg by mouth daily.  . Multiple Vitamins-Minerals (MULTIVITAMIN GUMMIES ADULT) CHEW Chew 2 each by mouth at bedtime.  . nitroGLYCERIN (NITROSTAT) 0.4 MG SL tablet Place 0.4 mg under the tongue every 5 (five) minutes as needed for chest pain.  Marland Kitchen omeprazole (PRILOSEC) 20 MG capsule Take 20 mg by mouth daily before breakfast.   . ondansetron (ZOFRAN) 4 MG tablet Take 1 tablet (4 mg total) by mouth every 8 (eight) hours as needed for nausea or vomiting.  Marland Kitchen oxybutynin (DITROPAN) 5 MG tablet Take 5 mg by mouth 2 (two) times daily.  . polyethylene glycol (MIRALAX / GLYCOLAX) packet Take 17 g by mouth daily as needed for mild constipation.  . Simethicone 80 MG TABS Take 80 mg by mouth daily as needed (gas).      Review of Systems  All other systems reviewed and are otherwise negative except as noted above.  Physical Exam    VS:  BP 108/60 (BP Location: Left Arm, Patient Position: Sitting, Cuff Size: Normal)   Pulse 70   Ht 5\' 2"  (1.575 m)   Wt 105 lb 4 oz (47.7 kg)   SpO2 97%   BMI 19.25 kg/m  , BMI Body mass index is 19.25 kg/m.  Wt  Readings from Last 3 Encounters:  08/06/20 105 lb 4 oz (47.7 kg)  08/05/20 104 lb (47.2 kg)  07/31/20 106 lb 2 oz (48.1 kg)   GEN: Well nourished, well developed, in no acute distress. HEENT: normal. Neck: Supple, no JVD, carotid bruits, or masses. Cardiac: RRR, no murmurs, rubs, or gallops. No clubbing, cyanosis, edema.  Radials 2+ and equal bilaterally.  Respiratory:  Respirations regular and unlabored, clear  to auscultation bilaterally. GI: Soft, nontender, nondistended. MS: No deformity or atrophy. Skin: Warm and dry, no rash. Neuro:  Strength and sensation are intact. Psych: Normal affect.  Assessment & Plan    1. Preoperative cardiovascular examination - Upcoming visceral angiography due to concern for mesenteric ischemia with Dr. Delana Meyer. Recent echo 07/17/20 with LVEF improved to 40-45%, stable wall motion abnormalities. EKG today no acute ST/T wave changes. According to the Revised Cardiac Risk Index (RCRI), her Perioperative Risk of Major Cardiac Event is (%): 11. Her Functional Capacity in METs is: 4.64 according to the Duke Activity Status Index (DASI). Per AHA/ACC guidelines she is deemed acceptable risk for the planned procedure with no additional cardiovascular testing.  Will route note to Dr. Franchot Gallo so he is aware.   2. CAD- Stable with no anginal symptoms. No indication for ischemic evaluation.  EKG today with no acute changes.  GDMT includes aspirin, beta-blocker.  Resume atorvastatin, as below. Heart healthy diet and regular cardiovascular exercise encouraged.   3. Chronic systolic heart failure- Euvolemic and well compensated.  NYHA I-II. Recent echo 07/17/20 LVEF 40-45%. Improved from previous 08/2017 with EF 30-35%.  GDMT includes Coreg, Lisinopril.  No indication for loop diuretic at this time.  Future considerations include addition of low-dose spironolactone.  4. HLD- atorvastatin discontinued during recent admission due to concern for leg weakness.  Anticipate much of  her leg weakness is due to deconditioning and PAD.  In the setting of previous coronary stenting, statin recommended. She is agreeable to resume Atorvastatin 20mg  daily. Most recent LDL 85, consider Zetia at follow up.   5. HTN- BP well controlled. Continue current antihypertensive regimen.  She is relatively hypotensive today but denies lightheadedness, dizziness, near-syncope, syncope.  She was educated to report the symptoms.  6. History of CVA - No residual defecits. Continue aspirin and statin.   Disposition: Follow up in 3-4 month(s) with Dr. Fletcher Anon or APP  Signed, Loel Dubonnet, NP 08/06/2020, 12:00 PM [place]

## 2020-08-07 ENCOUNTER — Encounter
Admission: RE | Disposition: A | Discharge status: Home / Self Care | Payer: Self-pay | Source: Home / Self Care | Attending: Vascular Surgery

## 2020-08-07 ENCOUNTER — Ambulatory Visit
Admission: RE | Admit: 2020-08-07 | Discharge: 2020-08-07 | Disposition: A | Discharge status: Home / Self Care | Payer: Medicare PPO | Attending: Vascular Surgery | Admitting: Vascular Surgery

## 2020-08-07 ENCOUNTER — Other Ambulatory Visit (INDEPENDENT_AMBULATORY_CARE_PROVIDER_SITE_OTHER): Payer: Self-pay | Admitting: Vascular Surgery

## 2020-08-07 ENCOUNTER — Encounter: Payer: Self-pay | Admitting: Vascular Surgery

## 2020-08-07 DIAGNOSIS — Z888 Allergy status to other drugs, medicaments and biological substances status: Secondary | ICD-10-CM | POA: Diagnosis not present

## 2020-08-07 DIAGNOSIS — I110 Hypertensive heart disease with heart failure: Secondary | ICD-10-CM | POA: Diagnosis not present

## 2020-08-07 DIAGNOSIS — Z8673 Personal history of transient ischemic attack (TIA), and cerebral infarction without residual deficits: Secondary | ICD-10-CM | POA: Insufficient documentation

## 2020-08-07 DIAGNOSIS — Z882 Allergy status to sulfonamides status: Secondary | ICD-10-CM | POA: Insufficient documentation

## 2020-08-07 DIAGNOSIS — D696 Thrombocytopenia, unspecified: Secondary | ICD-10-CM

## 2020-08-07 DIAGNOSIS — K55059 Acute (reversible) ischemia of intestine, part and extent unspecified: Secondary | ICD-10-CM

## 2020-08-07 DIAGNOSIS — I5042 Chronic combined systolic (congestive) and diastolic (congestive) heart failure: Secondary | ICD-10-CM | POA: Insufficient documentation

## 2020-08-07 DIAGNOSIS — I255 Ischemic cardiomyopathy: Secondary | ICD-10-CM | POA: Insufficient documentation

## 2020-08-07 DIAGNOSIS — Z79890 Hormone replacement therapy (postmenopausal): Secondary | ICD-10-CM | POA: Insufficient documentation

## 2020-08-07 DIAGNOSIS — K551 Chronic vascular disorders of intestine: Secondary | ICD-10-CM | POA: Diagnosis not present

## 2020-08-07 DIAGNOSIS — Z79899 Other long term (current) drug therapy: Secondary | ICD-10-CM | POA: Diagnosis not present

## 2020-08-07 DIAGNOSIS — Z8719 Personal history of other diseases of the digestive system: Secondary | ICD-10-CM | POA: Diagnosis not present

## 2020-08-07 DIAGNOSIS — Z823 Family history of stroke: Secondary | ICD-10-CM | POA: Insufficient documentation

## 2020-08-07 DIAGNOSIS — E785 Hyperlipidemia, unspecified: Secondary | ICD-10-CM | POA: Diagnosis not present

## 2020-08-07 DIAGNOSIS — Z885 Allergy status to narcotic agent status: Secondary | ICD-10-CM | POA: Diagnosis not present

## 2020-08-07 DIAGNOSIS — Z7982 Long term (current) use of aspirin: Secondary | ICD-10-CM | POA: Insufficient documentation

## 2020-08-07 DIAGNOSIS — I2510 Atherosclerotic heart disease of native coronary artery without angina pectoris: Secondary | ICD-10-CM | POA: Diagnosis not present

## 2020-08-07 DIAGNOSIS — Z8249 Family history of ischemic heart disease and other diseases of the circulatory system: Secondary | ICD-10-CM | POA: Diagnosis not present

## 2020-08-07 HISTORY — PX: VISCERAL ANGIOGRAPHY: CATH118276

## 2020-08-07 LAB — BUN: BUN: 14 mg/dL (ref 8–23)

## 2020-08-07 LAB — CREATININE, SERUM
Creatinine, Ser: 0.73 mg/dL (ref 0.44–1.00)
GFR, Estimated: >60 mL/min (ref >60)

## 2020-08-07 SURGERY — VISCERAL ANGIOGRAPHY
Anesthesia: Moderate Sedation

## 2020-08-07 MED ORDER — FENTANYL CITRATE (PF) 100 MCG/2ML IJ SOLN: 12.5000 ug | Freq: Once | INTRAMUSCULAR | Status: DC | PRN

## 2020-08-07 MED ORDER — CEFAZOLIN SODIUM-DEXTROSE 2-4 GM/100ML-% IV SOLN
INTRAVENOUS | Status: AC
  Administered 2020-08-07: 2 g via INTRAVENOUS
  Filled 2020-08-07: qty 100

## 2020-08-07 MED ORDER — SODIUM CHLORIDE 0.9 % IV SOLN: 250.0000 mL | INTRAVENOUS | Status: DC | PRN

## 2020-08-07 MED ORDER — HEPARIN SODIUM (PORCINE) 1000 UNIT/ML IJ SOLN
INTRAMUSCULAR | Status: DC | PRN
  Administered 2020-08-07: 4000 [IU] via INTRAVENOUS

## 2020-08-07 MED ORDER — LABETALOL HCL 5 MG/ML IV SOLN: 10.0000 mg | INTRAVENOUS | Status: DC | PRN

## 2020-08-07 MED ORDER — OXYCODONE HCL 5 MG PO TABS
5.0000 mg | ORAL_TABLET | ORAL | Status: DC | PRN
Start: 2020-08-07 — End: 2020-08-07

## 2020-08-07 MED ORDER — ONDANSETRON HCL 4 MG/2ML IJ SOLN: 4.0000 mg | Freq: Four times a day (QID) | INTRAMUSCULAR | Status: DC | PRN

## 2020-08-07 MED ORDER — METHYLPREDNISOLONE SODIUM SUCC 125 MG IJ SOLR: 125.0000 mg | Freq: Once | INTRAMUSCULAR | Status: DC | PRN

## 2020-08-07 MED ORDER — HEPARIN SODIUM (PORCINE) 1000 UNIT/ML IJ SOLN
INTRAMUSCULAR | Status: AC
  Filled 2020-08-07: qty 1

## 2020-08-07 MED ORDER — SODIUM CHLORIDE 0.9 % IV SOLN: INTRAVENOUS | Status: DC

## 2020-08-07 MED ORDER — MORPHINE SULFATE (PF) 4 MG/ML IV SOLN: 2.0000 mg | INTRAVENOUS | Status: DC | PRN

## 2020-08-07 MED ORDER — MIDAZOLAM HCL 5 MG/5ML IJ SOLN
INTRAMUSCULAR | Status: AC
  Filled 2020-08-07: qty 5

## 2020-08-07 MED ORDER — DIPHENHYDRAMINE HCL 50 MG/ML IJ SOLN: 50.0000 mg | Freq: Once | INTRAMUSCULAR | Status: DC | PRN

## 2020-08-07 MED ORDER — FENTANYL CITRATE (PF) 100 MCG/2ML IJ SOLN
INTRAMUSCULAR | Status: AC
  Filled 2020-08-07: qty 2

## 2020-08-07 MED ORDER — SODIUM CHLORIDE 0.9% FLUSH: 3.0000 mL | INTRAVENOUS | Status: DC | PRN

## 2020-08-07 MED ORDER — FAMOTIDINE 20 MG PO TABS: 40.0000 mg | ORAL_TABLET | Freq: Once | ORAL | Status: DC | PRN

## 2020-08-07 MED ORDER — HYDRALAZINE HCL 20 MG/ML IJ SOLN: 5.0000 mg | INTRAMUSCULAR | Status: DC | PRN

## 2020-08-07 MED ORDER — ACETAMINOPHEN 325 MG PO TABS: 650.0000 mg | ORAL_TABLET | ORAL | Status: DC | PRN

## 2020-08-07 MED ORDER — SODIUM CHLORIDE 0.9% FLUSH: 3.0000 mL | Freq: Two times a day (BID) | INTRAVENOUS | Status: DC

## 2020-08-07 MED ORDER — MIDAZOLAM HCL 2 MG/2ML IJ SOLN
INTRAMUSCULAR | Status: DC | PRN
Start: 2020-08-07 — End: 2020-08-07
  Administered 2020-08-07: 2 mg via INTRAVENOUS

## 2020-08-07 MED ORDER — MIDAZOLAM HCL 2 MG/ML PO SYRP: 8.0000 mg | ORAL_SOLUTION | Freq: Once | ORAL | Status: DC | PRN

## 2020-08-07 MED ORDER — FENTANYL CITRATE (PF) 100 MCG/2ML IJ SOLN
INTRAMUSCULAR | Status: DC | PRN
  Administered 2020-08-07: 50 ug via INTRAVENOUS

## 2020-08-07 MED ORDER — CLOPIDOGREL BISULFATE 75 MG PO TABS: 75.0000 mg | ORAL_TABLET | Freq: Every day | ORAL | 3 refills | Status: DC

## 2020-08-07 MED ORDER — CLOPIDOGREL BISULFATE 300 MG PO TABS: 300.0000 mg | ORAL_TABLET | ORAL | Status: DC

## 2020-08-07 MED ORDER — CEFAZOLIN SODIUM-DEXTROSE 2-4 GM/100ML-% IV SOLN: 2.0000 g | Freq: Once | INTRAVENOUS | Status: AC

## 2020-08-07 SURGICAL SUPPLY — 19 items
CATH ANGIO 5F PIGTAIL 65CM (CATHETERS) ×2 IMPLANT
CATH KUMPE SOFT-VU 5FR 65 (CATHETERS) ×2 IMPLANT
CATH VS15FR (CATHETERS) ×2 IMPLANT
COVER DRAPE FLUORO 36X44 (DRAPES) ×2 IMPLANT
COVER PROBE U/S 5X48 (MISCELLANEOUS) ×2 IMPLANT
DEVICE STARCLOSE SE CLOSURE (Vascular Products) ×2 IMPLANT
DEVICE TORQUE (MISCELLANEOUS) ×2 IMPLANT
GLIDEWIRE ANGLED SS 035X260CM (WIRE) ×2 IMPLANT
KIT ENCORE 26 ADVANTAGE (KITS) ×2 IMPLANT
KIT MICROPUNCTURE NIT STIFF (SHEATH) ×2 IMPLANT
NEEDLE ENTRY 21GA 7CM ECHOTIP (NEEDLE) ×2 IMPLANT
PACK ANGIOGRAPHY (CUSTOM PROCEDURE TRAY) ×2 IMPLANT
SHEATH ANL2 6FRX45 HC (SHEATH) ×2 IMPLANT
SHEATH BRITE TIP 5FRX11 (SHEATH) ×2 IMPLANT
STENT LIFESTREAM 6X16X80 (Permanent Stent) ×2 IMPLANT
SYR MEDRAD MARK 7 150ML (SYRINGE) ×2 IMPLANT
TUBING CONTRAST HIGH PRESS 48 (TUBING) ×4 IMPLANT
WIRE GUIDERIGHT .035X150 (WIRE) ×2 IMPLANT
WIRE MAGIC TOR.035 180C (WIRE) ×2 IMPLANT

## 2020-08-07 NOTE — Discharge Instructions (Signed)

## 2020-08-07 NOTE — Interval H&P Note (Signed)
History and Physical Interval Note:  08/07/2020 11:11 AM  Teresa Franklin  has presented today for surgery, with the diagnosis of SMA Stent Placement   SMA stenosis.  The various methods of treatment have been discussed with the patient and family. After consideration of risks, benefits and other options for treatment, the patient has consented to  Procedure(s): VISCERAL ANGIOGRAPHY (N/A) as a surgical intervention.  The patient's history has been reviewed, patient examined, no change in status, stable for surgery.  I have reviewed the patient's chart and labs.  Questions were answered to the patient's satisfaction.     [name]

## 2020-08-07 NOTE — Op Note (Signed)
[facility] Percutaneous Study/Intervention Procedural Note   Date of Surgery: 08/07/2020  Surgeon(s): [name]   Assistants:None  Pre-operative Diagnosis: Acute on chronic mesenteric ischemia Post-operative diagnosis: Same  Procedure(s) Performed: 1. Ultrasound guidance for vascular access right femoral artery 2. Catheter placement into SMA from right femoral approach 3. Aortogram and selective SMA angiogram 4. Balloon expandable stent placement to the superior mesenteric artery using a 6 mm diameter x 16 mm length lifestream balloon expandable stent 5. StarClose closure device right femoral artery  Anesthesia: local with moderate conscious sedation for approximately 38 minutes using parenteral Versed and Fentanyl.  Continuous ECG pulse oximetry and cardiopulmonary monitoring was performed by the interventional radiology nurse total sedation time is as noted above.  Fluoro Time: 5.0 minutes  Contrast: 30 cc  Indications: Patient is a 87 year old woman who was recently admitted to the hospital with abdominal pain and found to have changes consistent with mesenteric ischemia.  CT angiogram findings suggested high-grade stenosis of the SMA.  Angiogram is being performed to evaluate the lesion more thoroughly and potentially allow treatment. Risks and benefits were discussed and informed consent was obtained.  Procedure: The patient was identified and appropriate procedural time out was performed. The patient was then placed supine on the table and prepped and draped in the usual sterile fashion.Moderate conscious sedation was administered during a face to face encounter with the patient with the RN monitoring their vital signs, mental status, telemetry and pulse oximetry throughout the procedure.  Ultrasound was used to evaluate the right common femoral artery. It was patent  . A digital ultrasound image was acquired. A Seldinger needle was used to access the right common femoral artery under direct ultrasound guidance and a permanent image was performed. A 0.035 J wire was advanced without resistance and a 5Fr sheath was placed. Pigtail catheter was then placed into the aorta and a lateral projection was performed which showed typical orientations of the celiac and SMA.  Greater than 70% stenosis of the SMA was noted.  I then selective cannulated the SMA with a VS 1 catheter and a stiff angled Glidewire. Selective imaging of the superior mesenteric artery demonstrated the 70% ostial stenosis distally the SMA appeared to be widely patent and free of hemodynamically significant stenosis.. The patient was then given 4000 units of intravenous heparin and I exchanged for a 6 [place] Ansell sheath. I was able to navigate across the lesion without difficulty with a Glidewire and then exchanged for a 0.035 Magic torque wire. I then selected a 6 mm diameter by 16 mm length lifestream balloon expandable stent and deployed this across the stenosis at the origin of the superior mesenteric artery just back into the aorta in excellent location. The balloon was inflated to 12 atm.  Less than 5% residual stenosis was seen on completion angiogram and I elected to terminate the procedure. The guide catheter was removed and oblique arteriogram was performed of the right femoral artery. StarClose closure device was deployed in the usual fashion with excellent hemostatic result.  Findings:  Aortogram:Lateral projection demonstrates the aorta is widely patent.  There is calcific plaque noted but no hemodynamically significant lesions.  There is a typical orientation of the celiac and SMA arteries.  The celiac appears to be widely patent SMA: Selective imaging of the superior mesenteric artery demonstrated the 70% ostial stenosis distally the SMA appeared to be widely patent  and free of hemodynamically significant stenosis.  Less than 5% residual stenosis with  preservation of the distal superior mesenteric artery outflow     Disposition: Patient was taken to the recovery room in stable condition having tolerated the procedure well.   [name] 08/07/2020 12:08 PM   This note was created with Dragon Medical transcription system. Any errors in dictation are purely unintentional.

## 2020-08-08 ENCOUNTER — Encounter: Payer: Self-pay | Admitting: Vascular Surgery

## 2020-08-13 ENCOUNTER — Ambulatory Visit: Payer: Medicare PPO | Admitting: Gastroenterology

## 2020-08-20 NOTE — Progress Notes (Signed)
MRN : [id number]  Teresa Franklin is a 87 y.o. (1930-11-15) female who presents with chief complaint of No chief complaint on file. 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  As noted above she is now s/p PTA and stenting of the SMA.  The patient denies abdominal pain or postprandial symptoms.  The patient denies weight loss as well as nausea.  The patient does not substantiate food fear, particular foods do not seem to aggravate or alleviate the symptoms.  The patient denies bloody bowel movements or diarrhea.  The patient has a history of colonoscopy which was not diagnostic.  No history of peptic ulcer disease.   The patient is here also for followup and review of her PAD. There have been no interval changes in lower extremity symptoms. No interval shortening of the patient's claudication distance or development of rest pain symptoms. No new ulcers or wounds have occurred since the last visit.  There have been no significant changes to the patient's overall health care.   The patient denies amaurosis fugax or recent TIA symptoms. There are no recent neurological changes noted.  The patient denies history of DVT, PE or superficial thrombophlebitis. The patient denies recent episodes of angina     No outpatient medications have been marked as taking for the 08/22/20 encounter (Appointment) with Delana Meyer, Dolores Lory, MD.    Past Medical History:  Diagnosis Date  . Acute colitis 07/13/2020  . Anxiety   . AV block    a. 08/2015 in setting of NSTEMI and RPAV intervention-->required temp wire but not PPM.  . Back pain   . Chronic combined systolic (congestive) and diastolic (congestive) heart failure ([place])     a. 08/2015  Echo: EF 55-60%, no rwma, mod MR, mildly dil LA/RA, mod TR, PASP 18mmHg; b. 05/2016 Echo: EF 30-35%, inflat/inf sev HK, Gr1 DD; c. 08/2017 Echo: EF 30-35%, mild LVH. Sev inf/infseptal HK. Gr1 DD. Mod MR. mildly dil LA. Mildly reduced RV fxn.  . Colitis 07/17/2020  . Coronary artery disease    a. 08/2015 NSTEMI/PCI: LM nl, LAD 75p (med rx), 20 diffuse, 50d, LCX nl, OM1/2 nl, RCA 45m, RPAV 100 (2.25x12 Promus Premier DES) - complicated by 2:1 HB req temp wire post-PCI; b. 04/2016 MV: EF 34%, lateral HK/infarct. No ischemia.  . Diverticulitis   . Generalized anxiety disorder   . Hepatic steatosis   . HLD (hyperlipidemia)   . Hypertension   . Hypokalemia   . Hypomagnesemia   . Hypothyroidism   . Ischemic cardiomyopathy    a. 08/2015  Echo: EF 55-60%; b. 05/2016 Echo: EF 30-35%; c. 08/2017 Echo: EF 30-35%.  . OA (osteoarthritis)   . Osteopenia   . Sleep apnea   . Stroke [facility])    a. 08/2015 Right PCA territory infarct/temoral lobe cerebral infarction-->MRA showed severe flow liminting stenosis of R P2 cerebral artery;  b. 08/2015 Carotid U/S: 1-39% bilat ICA stenosis.  . Subdural hemorrhage ([place])    a. 05/2013 in setting of fall.  . Thrombocytopenia ([place])   . Weakness     Past Surgical History:  Procedure Laterality Date  . CARDIAC CATHETERIZATION N/A 09/08/2015   Procedure: Temporary Pacemaker;  Surgeon: Leonie Man, MD;  Location: [place] [facility];  Service: Cardiovascular;  Laterality: N/A;  . CARDIAC CATHETERIZATION N/A 09/08/2015   Procedure: Left Heart Cath and Coronary Angiography;  Surgeon: Leonie Man, MD;  Location: [place] [facility];  Service: Cardiovascular;  Laterality: N/A;  . CARDIAC CATHETERIZATION N/A 09/08/2015   Procedure: Coronary Stent Intervention;  Surgeon: Leonie Man, MD;  Location: [place] [facility];  Service: Cardiovascular;  Laterality: N/A;  . CARDIAC CATHETERIZATION  09/08/2015   Procedure: Central Line Insertion;  Surgeon: Leonie Man, MD;   Location: [place] [facility];  Service: Cardiovascular;;  . CARDIAC CATHETERIZATION  09/08/2015   Procedure: Arterial Line Insertion;  Surgeon: Leonie Man, MD;  Location: [place] [facility];  Service: Cardiovascular;;  . CARDIAC CATHETERIZATION N/A 09/09/2015   Procedure: Temporary Wire;  Surgeon: Lorretta Harp, MD;  Location: [place] [facility];  Service: Cardiovascular;  Laterality: N/A;  . COLONOSCOPY WITH PROPOFOL N/A 07/19/2020   Procedure: COLONOSCOPY WITH PROPOFOL;  Surgeon: Lin Landsman, MD;  Location: [facility] ENDOSCOPY;  Service: Gastroenterology;  Laterality: N/A;  . ELBOW SURGERY    . LEFT HEART CATH AND CORONARY ANGIOGRAPHY N/A 09/22/2017   Procedure: LEFT HEART CATH AND CORONARY ANGIOGRAPHY;  Surgeon: [name], MD;  Location: [place] [facility];  Service: Cardiovascular;  Laterality: N/A;  . VISCERAL ANGIOGRAPHY N/A 08/07/2020   Procedure: VISCERAL ANGIOGRAPHY;  Surgeon: Katha Cabal, MD;  Location: [place] [facility];  Service: Cardiovascular;  Laterality: N/A;    Social History Social History   Tobacco Use  . Smoking status: Never Smoker  . Smokeless tobacco: Never Used  Vaping Use  . Vaping Use: Never used  Substance Use Topics  . Alcohol use: No    Alcohol/week: 0.0 standard drinks  . Drug use: No    Family History Family History  Problem Relation Age of Onset  . Hypertension Mother   . Hyperlipidemia Mother   . Stroke Mother   . Diabetes Sister   . Hypertension Sister   . Hyperlipidemia Sister   . Bipolar disorder Sister   . Hyperlipidemia Sister   . Stroke Sister   . Prostate cancer Brother   . Breast cancer Neg Hx     Allergies  Allergen Reactions  . Codeine Nausea And Vomiting  . Flomax [Tamsulosin] Other (See Comments)    Pt states that this medication gave her a kidney infection.    . Lactose Intolerance (Gi) Other (See Comments)    Bladder problem  . Norco [Hydrocodone-Acetaminophen] Nausea And Vomiting  . Sulfa  Antibiotics Nausea And Vomiting  . Atorvastatin Other (See Comments)    Leg pain, leg weakness     REVIEW OF SYSTEMS (Negative unless checked)  Constitutional: [] Weight loss  [] Fever  [] Chills Cardiac: [] Chest pain   [] Chest pressure   [] Palpitations   [] Shortness of breath when laying flat   [] Shortness of breath with exertion. Vascular:  [x] Pain in legs with walking   [] Pain in legs at rest  [] History of DVT   [] Phlebitis   [] Swelling in legs   [] Varicose veins   [] Non-healing ulcers Pulmonary:   [] Uses home oxygen   [] Productive cough   [] Hemoptysis   [] Wheeze  [] COPD   [] Asthma Neurologic:  [] Dizziness   [] Seizures   [] History of stroke   [] History of TIA  [] Aphasia   [] Vissual changes   [] Weakness or numbness in arm   [x] Weakness or numbness in leg Musculoskeletal:   [] Joint swelling   [  x]Joint pain   [] Low back pain Hematologic:  [] Easy bruising  [] Easy bleeding   [] Hypercoagulable state   [] Anemic Gastrointestinal:  [] Diarrhea   [] Vomiting  [] Gastroesophageal reflux/heartburn   [] Difficulty swallowing. Genitourinary:  [] Chronic kidney disease   [] Difficult urination  [] Frequent urination   [] Blood in urine Skin:  [] Rashes   [] Ulcers  Psychological:  [] History of anxiety   []  History of major depression.  Physical Examination  There were no vitals filed for this visit. There is no height or weight on file to calculate BMI. Gen: WD/WN, NAD Head: [place]/AT, No temporalis wasting.  Ear/Nose/Throat: Hearing grossly intact, nares w/o erythema or drainage Eyes: PER, EOMI, sclera nonicteric.  Neck: Supple, no large masses.   Pulmonary:  Good air movement, no audible wheezing bilaterally, no use of accessory muscles.  Cardiac: RRR, no JVD Vascular:  Vessel Right Left  Radial Palpable Palpable  PT Not Palpable Not Palpable  DP Not Palpable Not Palpable  Gastrointestinal: Non-distended. No guarding/no peritoneal signs.  Musculoskeletal: M/S 5/5 throughout.  No deformity or atrophy.   Neurologic: CN 2-12 intact. Symmetrical.  Speech is fluent. Motor exam as listed above. Psychiatric: Judgment intact, Mood & affect appropriate for pt's clinical situation. Dermatologic: No rashes or ulcers noted.  No changes consistent with cellulitis.   CBC Lab Results  Component Value Date   WBC 3.8 (L) 07/20/2020   HGB 11.9 (L) 07/20/2020   HCT 34.5 (L) 07/20/2020   MCV 94.8 07/20/2020   PLT 81 (L) 07/20/2020    BMET    Component Value Date/Time   NA 140 07/20/2020 0354   NA 142 09/17/2017 1039   NA 136 03/26/2013 1808   K 3.9 07/20/2020 0354   K 4.2 03/26/2013 1808   CL 106 07/20/2020 0354   CL 100 03/26/2013 1808   CO2 28 07/20/2020 0354   CO2 30 03/26/2013 1808   GLUCOSE 104 (H) 07/20/2020 0354   GLUCOSE 104 (H) 03/26/2013 1808   BUN 14 08/07/2020 0958   BUN 13 09/17/2017 1039   BUN 15 03/26/2013 1808   CREATININE 0.73 08/07/2020 0958   CREATININE 0.67 03/26/2013 1808   CALCIUM 9.7 07/20/2020 0354   CALCIUM 10.8 (H) 03/26/2013 1808   GFRNONAA >60 08/07/2020 0958   GFRNONAA >60 03/26/2013 1808   GFRAA >60 12/06/2019 1304   GFRAA >60 03/26/2013 1808   Estimated Creatinine Clearance: 37.3 mL/min (by C-G formula based on SCr of 0.73 mg/dL).  COAG Lab Results  Component Value Date   INR 1.0 12/02/2018   INR 1.1 09/17/2017   INR 1.18 09/06/2015    Radiology PERIPHERAL VASCULAR CATHETERIZATION  Result Date: 08/07/2020 See Op Note    Assessment/Plan 1. Superior mesenteric artery stenosis (HCC) Recommend:  The patient is status post successful angiogram with intervention of the mesenteric vessels.  SMA stent placement  was performed.  The patient reports that the abdominal pain is improved and the post prandial symptoms are essentially gone.   The patient denies lifestyle limiting changes at this point in time.  No further invasive studies, angiography or surgery at this time The patient should continue walking and begin a more formal exercise program.   The patient should continue antiplatelet therapy and aggressive treatment of the lipid abnormalities  Smoking cessation was again discussed  Patient should undergo noninvasive studies as ordered. The patient will follow up with me after the studies.   - VAS [place] MESENTERIC; Future  2. Atherosclerosis of native artery of both lower extremities with intermittent  claudication ([place])  Recommend:  The patient has evidence of atherosclerosis of the lower extremities with claudication.  The patient does not voice lifestyle limiting changes at this point in time.  Noninvasive studies do not suggest clinically significant change.  No invasive studies, angiography or surgery at this time The patient should continue walking and begin a more formal exercise program.  The patient should continue antiplatelet therapy and aggressive treatment of the lipid abnormalities  No changes in the patient's medications at this time  The patient should continue wearing graduated compression socks 10-15 mmHg strength to control the mild edema.   - VAS [place] ABI WITH/WO TBI; Future  3. Coronary artery disease involving native coronary artery of native heart with angina pectoris ([place]) Continue cardiac and antihypertensive medications as already ordered and reviewed, no changes at this time.  Continue statin as ordered and reviewed, no changes at this time  Nitrates PRN for chest pain   4. Essential hypertension Continue antihypertensive medications as already ordered, these medications have been reviewed and there are no changes at this time.   5. Hyperlipidemia with target LDL less than 70 Continue statin as ordered and reviewed, no changes at this time     [name], MD  08/20/2020 6:00 PM

## 2020-08-22 ENCOUNTER — Encounter (INDEPENDENT_AMBULATORY_CARE_PROVIDER_SITE_OTHER): Payer: Self-pay | Admitting: Vascular Surgery

## 2020-08-22 ENCOUNTER — Other Ambulatory Visit: Payer: Self-pay

## 2020-08-22 ENCOUNTER — Ambulatory Visit (INDEPENDENT_AMBULATORY_CARE_PROVIDER_SITE_OTHER): Payer: Medicare PPO | Admitting: Vascular Surgery

## 2020-08-22 VITALS — BP 169/73 | HR 69 | Ht 62.0 in | Wt 105.0 lb

## 2020-08-22 DIAGNOSIS — I70213 Atherosclerosis of native arteries of extremities with intermittent claudication, bilateral legs: Secondary | ICD-10-CM | POA: Diagnosis not present

## 2020-08-22 DIAGNOSIS — K551 Chronic vascular disorders of intestine: Secondary | ICD-10-CM

## 2020-08-22 DIAGNOSIS — I25119 Atherosclerotic heart disease of native coronary artery with unspecified angina pectoris: Secondary | ICD-10-CM

## 2020-08-22 DIAGNOSIS — E785 Hyperlipidemia, unspecified: Secondary | ICD-10-CM

## 2020-08-22 DIAGNOSIS — I10 Essential (primary) hypertension: Secondary | ICD-10-CM

## 2020-08-24 ENCOUNTER — Encounter (INDEPENDENT_AMBULATORY_CARE_PROVIDER_SITE_OTHER): Payer: Self-pay | Admitting: Vascular Surgery

## 2020-08-24 DIAGNOSIS — I70219 Atherosclerosis of native arteries of extremities with intermittent claudication, unspecified extremity: Secondary | ICD-10-CM | POA: Insufficient documentation

## 2020-10-17 ENCOUNTER — Telehealth (INDEPENDENT_AMBULATORY_CARE_PROVIDER_SITE_OTHER): Payer: Self-pay | Admitting: Vascular Surgery

## 2020-10-17 NOTE — Telephone Encounter (Signed)
The pt's pharmacy has been changed to the pt's preferred pharmacy

## 2020-10-17 NOTE — Telephone Encounter (Addendum)
Called stating that she is relocating to [place] and would like her pharmacy changed to [facility] at [address] in [place] [address]  This note is for documentation purposes.

## 2020-10-31 ENCOUNTER — Telehealth: Payer: Self-pay | Admitting: Oncology

## 2020-10-31 NOTE — Telephone Encounter (Signed)
Patient called to cancel all upcoming appointments. She stated that she has moved and switched providers.

## 2020-11-09 NOTE — Progress Notes (Signed)
Cardiology Office Note:    Date:  11/11/2020   ID:  Teresa Franklin, DOB 1931-02-17, MRN [id number]  PCP:  Henrietta Hoover, MD  [facility] Cardiologist:  [name], MD  [facility] Electrophysiologist:  None   Referring MD: Lorenza Cambridge*   Chief Complaint: 3-4 month follow-up  History of Present Illness:    Teresa Franklin is a 87 y.o. female with a hx of CAD s/p PCI/DES right posterior AV groove artery, CVA, HTN, CLD, HFrEF, ICM (LVEF 30-35% in 2018, normal by cath 2019, echo LVEF 40-45 in 06/2020) who presents for follow-up.   She presented in June 2017 with NSTEMI complicated by hypotension and high-grade AV block.  Cardiac catheterization showed occluded right posterior AV groove artery treated with PCI and DES.  She did not require PPM.  EF was normal at that time.  Also noted significant disease affecting proximal and mid LAD.  Recommended for medical therapy considering fetal status and involvement of the large diagonal branch at site of stenosis.  She also had CVA during the same hospitalization though no residual neurological deficits.   Due to worsening exertional dyspnea 2018 underwent nuclear stress test with no evidence of ischemia.  Evidence of prior infarct in lateral wall with EF 34%.  Followed by echo with LVEF 30-35% with severe hypokinesis of inferolateral and inferior wall with moderate mitral regurgitation no evidence of pulmonary hypertension.  LHC 09/2017 with widely patent RCA stent and no significant restenosis.  Moderate proximal to mid LAD disease estimated to be 60% which was actually improved compared to prior.  LVEDP normal.   She was seen 11/17/2018 by Dr. Fletcher Anon.  It was noted that her Coreg dose has been reduced due to hypotension.  She had also lost her husband since last seen and was continuing to grieve. Seen in follow up 01/2020 and doing overall well from cardiac perspective. No changes were made.    She was hospitalized  07/13/20-07/16/20 with abdominal pain and recommended for outpatient follow up. She presented 07/17/20 with similar presentation and repeat CT progressive pancolonic wall thickening consistent with nonspecific infectious or inflammatory colitis. She was treated with antibiotics, colonoscopy with no evidence of colitis and resection of colon polyps. She had mildly elevated troponin likely demand ischemia with echo showing LVEF 40-45%.    Seen by Dr. Delana Meyer of vascular surgery yesterday due to abdominal pain and concern for mesenteric ischemia.  Per his review her CT showed SMA with approximately 70% stenosis.  She is scheduled for visceral angiography.   She was last seen 08/06/20 for pre-op cardiac exam and was deemed an acceptable risk for procedure. She received a stent to superior mesenteric artery.  Today, the patient reports she has been doing well since the last visit. After the stent they placed her on DAPT with Aspirin and plavix. She has easy bruising.GI issues better, still taking Miralax a lot. No appetite. She is down to 107lbs, before around 118lbs. Denies chest pain or shortness, lower leg edema, orthopnea, pnd. She has leg weakness. Sees vascular. She walks a quarter mild a day. No palpitations.   Past Medical History:  Diagnosis Date   Acute colitis 07/13/2020   Anxiety    AV block    a. 08/2015 in setting of NSTEMI and RPAV intervention-->required temp wire but not PPM.   Back pain    Chronic combined systolic (congestive) and diastolic (congestive) heart failure ([place])    a. 08/2015  Echo: EF  55-60%, no rwma, mod MR, mildly dil LA/RA, mod TR, PASP 54mHg; b. 05/2016 Echo: EF 30-35%, inflat/inf sev HK, Gr1 DD; c. 08/2017 Echo: EF 30-35%, mild LVH. Sev inf/infseptal HK. Gr1 DD. Mod MR. mildly dil LA. Mildly reduced RV fxn.   Colitis 07/17/2020   Coronary artery disease    a. 08/2015 NSTEMI/PCI: LM nl, LAD 75p (med rx), 20 diffuse, 50d, LCX nl, OM1/2 nl, RCA 245mRPAV 100 (2.25x12 Promus  Premier DES) - complicated by 2:1 HB req temp wire post-PCI; b. 04/2016 MV: EF 34%, lateral HK/infarct. No ischemia.   Diverticulitis    Generalized anxiety disorder    Hepatic steatosis    HLD (hyperlipidemia)    Hypertension    Hypokalemia    Hypomagnesemia    Hypothyroidism    Ischemic cardiomyopathy    a. 08/2015  Echo: EF 55-60%; b. 05/2016 Echo: EF 30-35%; c. 08/2017 Echo: EF 30-35%.   OA (osteoarthritis)    Osteopenia    Sleep apnea    [facility]   a. 08/2015 Right PCA territory infarct/temoral lobe cerebral infarction-->MRA showed severe flow liminting stenosis of R P2 cerebral artery;  b. 08/2015 Carotid U/S: 1-39% bilat ICA stenosis.   Subdural hemorrhage ([facility]   a. 05/2013 in setting of fall.   Thrombocytopenia ([facility]   Weakness     Past Surgical History:  Procedure Laterality Date   CARDIAC CATHETERIZATION N/A 09/08/2015   Procedure: Temporary Pacemaker;  Surgeon: DaLeonie ManMD;  [facility];  Service: Cardiovascular;  Laterality: N/A;   CARDIAC CATHETERIZATION N/A 09/08/2015   Procedure: Left Heart Cath and Coronary Angiography;  Surgeon: DaLeonie ManMD;  [facility];  Service: Cardiovascular;  Laterality: N/A;   CARDIAC CATHETERIZATION N/A 09/08/2015   Procedure: Coronary Stent Intervention;  Surgeon: DaLeonie ManMD;  [facility];  Service: Cardiovascular;  Laterality: N/A;   CARDIAC CATHETERIZATION  09/08/2015   Procedure: Central Line Insertion;  Surgeon: DaLeonie ManMD;  [facility];  Service: Cardiovascular;;   CARDIAC CATHETERIZATION  09/08/2015   Procedure: Arterial Line Insertion;  Surgeon: DaLeonie ManMD;  [facility];  Service: Cardiovascular;;   CARDIAC CATHETERIZATION N/A 09/09/2015   Procedure: Temporary Wire;  Surgeon: JoLorretta HarpMD;  [place] [facility];  Service: Cardiovascular;  Laterality: N/A;   COLONOSCOPY WITH PROPOFOL N/A  07/19/2020   Procedure: COLONOSCOPY WITH PROPOFOL;  Surgeon: VaLin LandsmanMD;  [facility];  Service: Gastroenterology;  Laterality: N/A;   ELBOW SURGERY     LEFT HEART CATH AND CORONARY ANGIOGRAPHY N/A 09/22/2017   Procedure: LEFT HEART CATH AND CORONARY ANGIOGRAPHY;  Surgeon: ArWellington HampshireMD;  [facility];  Service: Cardiovascular;  Laterality: N/A;   VISCERAL ANGIOGRAPHY N/A 08/07/2020   Procedure: VISCERAL ANGIOGRAPHY;  Surgeon: ScKatha CabalMD;  [place];  Service: Cardiovascular;  Laterality: N/A;    Current Medications: Current Meds  Medication Sig   acetaminophen (TYLENOL) 325 MG tablet Take 650 mg by mouth every 6 (six) hours as needed for mild pain, fever or headache.    ALPRAZolam (XANAX) 0.25 MG tablet Take 1 tablet (0.25 mg total) by mouth at bedtime as needed for anxiety.   aspirin 81 MG chewable tablet Chew 1 tablet (81 mg total) by mouth daily.   atorvastatin (LIPITOR) 20 MG tablet Take 1 tablet (20 mg  total) by mouth daily.   carvedilol (COREG) 6.25 MG tablet TAKE (1) TABLET BY MOUTH TWICE DAILY   clopidogrel (PLAVIX) 75 MG tablet Take 1 tablet (75 mg total) by mouth daily.   ezetimibe (ZETIA) 10 MG tablet Take 1 tablet (10 mg total) by mouth daily.   folic acid-pyridoxine-cyancobalamin (FOLBIC) 2.5-25-2 MG TABS tablet Take 1 tablet by mouth daily.   hydroxypropyl methylcellulose / hypromellose (ISOPTO TEARS / GONIOVISC) 2.5 % ophthalmic solution Place 1 drop into both eyes 4 (four) times daily as needed for dry eyes.   levothyroxine (SYNTHROID, LEVOTHROID) 25 MCG tablet Take 25 mcg by mouth daily before breakfast.    Multiple Vitamins-Minerals (MULTIVITAMIN GUMMIES ADULT) CHEW Chew 2 each by mouth at bedtime.   nitroGLYCERIN (NITROSTAT) 0.4 MG SL tablet Place 0.4 mg under the tongue every 5 (five) minutes as needed for chest pain.   omeprazole (PRILOSEC) 20 MG capsule Take 20 mg by mouth daily before  breakfast.    ondansetron (ZOFRAN) 4 MG tablet Take 1 tablet (4 mg total) by mouth every 8 (eight) hours as needed for nausea or vomiting.   oxybutynin (DITROPAN) 5 MG tablet Take 5 mg by mouth 2 (two) times daily.   polyethylene glycol (MIRALAX / GLYCOLAX) packet Take 17 g by mouth daily as needed for mild constipation.   sacubitril-valsartan (ENTRESTO) 24-26 MG Take 1 tablet by mouth 2 (two) times daily.   Simethicone 80 MG TABS Take 80 mg by mouth daily as needed (gas).      Allergies:   Codeine, Flomax [tamsulosin], Lactose intolerance (gi), Norco [hydrocodone-acetaminophen], Sulfa antibiotics, and Atorvastatin   Social History   Socioeconomic History   Marital status: Widowed    Spouse name: Not on file   Number of children: Not on file   Years of education: Not on file   Highest education level: Not on file  Occupational History   Not on file  Tobacco Use   Smoking status: Never   Smokeless tobacco: Never  Vaping Use   Vaping Use: Never used  Substance and Sexual Activity   Alcohol use: No    Alcohol/week: 0.0 standard drinks   Drug use: No   Sexual activity: Not Currently    Birth control/protection: None  Other Topics Concern   Not on file  Social History Narrative   Not on file   Social Determinants of Health   Financial Resource Strain: Not on file  Food Insecurity: Not on file  Transportation Needs: Not on file  Physical Activity: Not on file  Stress: Not on file  Social Connections: Not on file     Family History: The patient's family history includes Bipolar disorder in her sister; Diabetes in her sister; Hyperlipidemia in her mother, sister, and sister; Hypertension in her mother and sister; Prostate cancer in her brother; Stroke in her mother and sister. There is no history of Breast cancer.  ROS:   Please see the history of present illness.     All other systems reviewed and are negative.  EKGs/Labs/Other Studies Reviewed:    The following  studies were reviewed today:  Echo limited 07/17/20   1. Left ventricular ejection fraction, by estimation, is 40 to 45%. The  left ventricle has mild to moderately decreased function. The left  ventricle demonstrates regional wall motion abnormalities (see scoring  diagram/findings for description). There is   mild left ventricular hypertrophy. Left ventricular diastolic parameters  are consistent with Grade I diastolic dysfunction (impaired relaxation).  There is  severe hypokinesis of the left ventricular, basal-mid  inferolateral wall and inferior wall.   2. Mild mitral valve regurgitation.   3. The aortic valve was not well visualized. Aortic valve regurgitation  is mild. Mild aortic valve sclerosis is present, with no evidence of  aortic valve stenosis.   [place] 09/2017  Previously placed Post Atrio drug eluting stent is widely patent. Balloon angioplasty was performed. Prox LAD to Mid LAD lesion is 50% stenosed. Ost 1st Diag to 1st Diag lesion is 20% stenosed. Ost RPDA to RPDA lesion is 60% stenosed. Prox RCA to Mid RCA lesion is 20% stenosed.   1.  Widely patent RCA stent with no significant restenosis.  Moderate proximal to mid LAD disease estimated to be 60% which seems to be actually better than last time. 2.  Normal ejection fraction by echo.  Left ventricular angiography was not performed.  Normal left ventricular end-diastolic pressure.   Recommendations: Continue medical therapy for coronary artery disease and ischemic cardiomyopathy.  No revascularization is needed.  Echo 08/2017 Study Conclusions   - Left ventricle: The cavity size was mildly dilated. Wall    thickness was increased in a pattern of mild LVH. Systolic    function was moderately to severely reduced. The estimated    ejection fraction was in the range of 30% to 35%. There is severe    hypokinesis of the inferior and inferoseptal myocardium. Doppler    parameters are consistent with abnormal left  ventricular    relaxation (grade 1 diastolic dysfunction).  - Aortic valve: Trileaflet; mildly thickened, mildly calcified    leaflets. Transvalvular velocity was within the normal range.    There was no stenosis. There was no significant regurgitation.  - Mitral valve: Calcified annulus. Mildly thickened leaflets .    There was moderate regurgitation.  - Left atrium: The atrium was mildly dilated.  - Right ventricle: The cavity size was normal. Wall thickness was    mildly increased. Systolic function was mildly reduced.   Echo 05/2016    EKG:  EKG is  ordered today.  The ekg ordered today demonstrates NSR, 62bpm, nonspecific T wave abnormality, no significant change  Recent Labs: 07/14/2020: TSH 0.593 07/17/2020: ALT 14 07/20/2020: Hemoglobin 11.9; Magnesium 1.8; Platelets 81; Potassium 3.9; Sodium 140 08/07/2020: BUN 14; Creatinine, Ser 0.73  Recent Lipid Panel    Component Value Date/Time   CHOL 114 09/09/2015 0350   TRIG 111 09/09/2015 0350   HDL 38 (L) 09/09/2015 0350   CHOLHDL 3.0 09/09/2015 0350   VLDL 22 09/09/2015 0350   LDLCALC 54 09/09/2015 0350     Physical Exam:    VS:  BP (!) 152/68   Pulse 62   Ht 5' 2.5" (1.588 m)   Wt 107 lb (48.5 kg)   BMI 19.26 kg/m     Wt Readings from Last 3 Encounters:  11/11/20 107 lb (48.5 kg)  08/22/20 105 lb (47.6 kg)  08/07/20 105 lb 2.6 oz (47.7 kg)     GEN:  Well nourished, well developed in no acute distress HEENT: Normal NECK: No JVD; No carotid bruits LYMPHATICS: No lymphadenopathy CARDIAC: RRR, no murmurs, rubs, gallops RESPIRATORY:  Clear to auscultation without rales, wheezing or rhonchi  ABDOMEN: Soft, non-tender, non-distended MUSCULOSKELETAL:  No edema; No deformity  SKIN: Warm and dry NEUROLOGIC:  Alert and oriented x 3 PSYCHIATRIC:  Normal affect   ASSESSMENT:    1. Chronic systolic heart failure ([place])   2. History of CVA (cerebrovascular accident)  3. Hyperlipidemia LDL goal <70   4. Coronary  artery disease of native artery of native heart with stable angina pectoris ([place])   5. Essential hypertension   6. Coronary artery disease involving native coronary artery of native heart with angina pectoris ([place])    PLAN:    In order of problems listed above:  CAD s/p PCI/DES right posterior AV groove artery in 2017 Patient denies anginal symptoms. Cath 09/2017 showed patent RCA stent with no significant restenosis, mod proximal mid LAD disease at 60%(less than prior), normal EF. EKG with no ischemic changes. No further ischemic work-up at this time. Continue medical therapy with Aspirin, plavix (DAPT for recent SMA stent), statin, BB, zetia.   HFrEF  ICM  LVEF 30-35% in 2018. Cath in 2019 reported normal EF. Most recent LVEF 40-45% by echo 06/2020. AT the last visit she was on coreg and lisinopril, however lisinopril has since been stopped, unsure as to why. We called pharmacies to confirm she has not been taking lisinopril. Start Entresto 24-'26mg'$  BID. BMET in 1 week. Follow-up in 1 month. Continue GDMT at follow-up with Farxiga/spiro. Can re-check echo in a few months.   HLD H/o of leg weakness on Atorvastatin, she was restarted on '20mg'$  at the last visit. Last LDL above goal at 85. I will start Zetia. Can re-check lipid panel at follow-up.   HTN BP above goal, 152/68. Plan to start [facility] as above. Continue Coreg 6.'25mg'$ BID.   H/o CVA PAD Recent abdominal artery stenting on Aspirin and plavix. Plan to stop plavix next month. Continue statin and Zetia.   Disposition: Follow up in 1 month(s) with MD/APP   Signed, Milika Ventress Ninfa Meeker, PA-C  11/11/2020 2:16 PM    [facility]

## 2020-11-11 ENCOUNTER — Other Ambulatory Visit: Payer: Self-pay

## 2020-11-11 ENCOUNTER — Ambulatory Visit: Payer: Medicare PPO | Admitting: Medical

## 2020-11-11 ENCOUNTER — Encounter: Payer: Self-pay | Admitting: Medical

## 2020-11-11 VITALS — BP 152/68 | HR 62 | Ht 62.5 in | Wt 107.0 lb

## 2020-11-11 DIAGNOSIS — Z8673 Personal history of transient ischemic attack (TIA), and cerebral infarction without residual deficits: Secondary | ICD-10-CM | POA: Diagnosis not present

## 2020-11-11 DIAGNOSIS — E785 Hyperlipidemia, unspecified: Secondary | ICD-10-CM | POA: Diagnosis not present

## 2020-11-11 DIAGNOSIS — I25118 Atherosclerotic heart disease of native coronary artery with other forms of angina pectoris: Secondary | ICD-10-CM

## 2020-11-11 DIAGNOSIS — I5022 Chronic systolic (congestive) heart failure: Secondary | ICD-10-CM

## 2020-11-11 DIAGNOSIS — I10 Essential (primary) hypertension: Secondary | ICD-10-CM

## 2020-11-11 DIAGNOSIS — I25119 Atherosclerotic heart disease of native coronary artery with unspecified angina pectoris: Secondary | ICD-10-CM

## 2020-11-11 MED ORDER — ENTRESTO 24-26 MG PO TABS
1.0000 | ORAL_TABLET | Freq: Two times a day (BID) | ORAL | 6 refills | Status: DC
Start: 2020-11-11 — End: 2021-01-16

## 2020-11-11 MED ORDER — EZETIMIBE 10 MG PO TABS: 10.0000 mg | ORAL_TABLET | Freq: Every day | ORAL | 3 refills | Status: AC

## 2020-11-11 NOTE — Patient Instructions (Signed)
Medication Instructions:  Your physician has recommended you make the following change in your medication:   START Ezetimibe 10 mg once daily STOP Lisinopril  START Entresto 24/26 mg take 1 tablet twice a day  *If you need a refill on your cardiac medications before your next appointment, please call your pharmacy*   Lab Work: BMET in one week. No appointment is needed. Go to Medical Mall Entrance at [facility] then check in at the 1st desk on the right to check in, past the screening table. Lab hours: Monday- Friday (7:30 am- 5:30 pm)  If you have labs (blood work) drawn today and your tests are completely normal, you will receive your results only by: [place] (if you have MyChart) OR A paper copy in the mail If you have any lab test that is abnormal or we need to change your treatment, we will call you to review the results.   Testing/Procedures: None   Follow-Up: At [facility], you and your health needs are our priority.  As part of our continuing mission to provide you with exceptional heart care, we have created designated Provider Care Teams.  These Care Teams include your primary Cardiologist (physician) and Advanced Practice Providers (APPs -  Physician Assistants and Nurse Practitioners) who all work together to provide you with the care you need, when you need it.  We recommend signing up for the patient portal called "MyChart".  Sign up information is provided on this After Visit Summary.  MyChart is used to connect with patients for Virtual Visits (Telemedicine).  Patients are able to view lab/test results, encounter notes, upcoming appointments, etc.  Non-urgent messages can be sent to your provider as well.   To learn more about what you can do with MyChart, go to [email or web address].    Your next appointment:   1 month(s)  The format for your next appointment:   In Person  Provider:   You may see [name], MD or one of the following Advanced  Practice Providers on your designated Care Team:   Murray Hodgkins, NP Christell Faith, PA-C Marrianne Mood, PA-C Cadence Durand, [place]

## 2020-11-12 ENCOUNTER — Other Ambulatory Visit (INDEPENDENT_AMBULATORY_CARE_PROVIDER_SITE_OTHER): Payer: Self-pay | Admitting: Vascular Surgery

## 2020-11-18 ENCOUNTER — Other Ambulatory Visit
Admission: RE | Admit: 2020-11-18 | Discharge: 2020-11-18 | Disposition: A | Discharge status: Home / Self Care | Payer: Medicare PPO | Attending: Medical | Admitting: Medical

## 2020-11-18 DIAGNOSIS — E785 Hyperlipidemia, unspecified: Secondary | ICD-10-CM | POA: Diagnosis present

## 2020-11-18 DIAGNOSIS — Z8673 Personal history of transient ischemic attack (TIA), and cerebral infarction without residual deficits: Secondary | ICD-10-CM | POA: Diagnosis present

## 2020-11-18 DIAGNOSIS — I25118 Atherosclerotic heart disease of native coronary artery with other forms of angina pectoris: Secondary | ICD-10-CM | POA: Diagnosis present

## 2020-11-18 DIAGNOSIS — I5022 Chronic systolic (congestive) heart failure: Secondary | ICD-10-CM | POA: Insufficient documentation

## 2020-11-18 LAB — BASIC METABOLIC PANEL
Anion gap: 7 (ref 5–15)
BUN: 14 mg/dL (ref 8–23)
CO2: 26 mmol/L (ref 22–32)
Calcium: 10 mg/dL (ref 8.9–10.3)
Chloride: 104 mmol/L (ref 98–111)
Creatinine, Ser: 0.92 mg/dL (ref 0.44–1.00)
GFR, Estimated: >60 mL/min (ref >60)
Glucose, Bld: 100 mg/dL — ABNORMAL HIGH (ref 70–99)
Potassium: 5 mmol/L (ref 3.5–5.1)
Sodium: 137 mmol/L (ref 135–145)

## 2020-12-09 ENCOUNTER — Other Ambulatory Visit: Payer: Medicare PPO

## 2020-12-10 NOTE — Progress Notes (Signed)
MRN : [id number]  Teresa Franklin is a 87 y.o. (06-Jun-1930) female who presents with chief complaint of check stent .  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  As noted above she is now s/p PTA and stenting of the SMA.  The patient denies abdominal pain or postprandial symptoms.  The patient denies weight loss as well as nausea.  The patient does not substantiate food fear, particular foods do not seem to aggravate or alleviate the symptoms.  The patient denies bloody bowel movements or diarrhea.  The patient has a history of colonoscopy which was not diagnostic.  No history of peptic ulcer disease.    The patient is here also for followup and review of her PAD. There have been no interval changes in lower extremity symptoms. No interval shortening of the patient's claudication distance or development of rest pain symptoms. No new ulcers or wounds have occurred since the last visit.   There have been no significant changes to the patient's overall health care.    The patient denies amaurosis fugax or recent TIA symptoms. There are no recent neurological changes noted.   The patient denies history of DVT, PE or superficial thrombophlebitis. The patient denies recent episodes of angina   Duplex ultrasound of the mesenteric arteries shows the SMA stent is widely patent and the celiac and IMA are also widely patent  ABI's Rt=0.90 and Lt=0.81    No outpatient medications have been marked as taking for the 12/12/20 encounter (Appointment) with Delana Meyer, Dolores Lory, MD.    Past Medical History:  Diagnosis Date   Acute colitis 07/13/2020   Anxiety    AV block    a. 08/2015 in setting of NSTEMI and RPAV  intervention-->required temp wire but not PPM.   Back pain    Chronic combined systolic (congestive) and diastolic (congestive) heart failure ([place])    a. 08/2015  Echo: EF 55-60%, no rwma, mod MR, mildly dil LA/RA, mod TR, PASP 58mmHg; b. 05/2016 Echo: EF 30-35%, inflat/inf sev HK, Gr1 DD; c. 08/2017 Echo: EF 30-35%, mild LVH. Sev inf/infseptal HK. Gr1 DD. Mod MR. mildly dil LA. Mildly reduced RV fxn.   Colitis 07/17/2020   Coronary artery disease    a. 08/2015 NSTEMI/PCI: LM nl, LAD 75p (med rx), 20 diffuse, 50d, LCX nl, OM1/2 nl, RCA 41m, RPAV 100 (2.25x12 Promus Premier DES) - complicated by 2:1 HB req temp wire post-PCI; b. 04/2016 MV: EF 34%, lateral HK/infarct. No ischemia.   Diverticulitis    Generalized anxiety disorder    Hepatic steatosis    HLD (hyperlipidemia)    Hypertension    Hypokalemia    Hypomagnesemia    Hypothyroidism    Ischemic cardiomyopathy    a. 08/2015  Echo: EF 55-60%; b. 05/2016 Echo: EF 30-35%; c. 08/2017 Echo: EF 30-35%.   OA (osteoarthritis)    Osteopenia    Sleep apnea    Stroke [facility])    a. 08/2015 Right PCA territory infarct/temoral lobe cerebral infarction-->MRA showed severe flow liminting stenosis of R P2 cerebral artery;  b. 08/2015 Carotid U/S: 1-39% bilat ICA stenosis.   Subdural hemorrhage ([place])    a. 05/2013 in setting of fall.   Thrombocytopenia ([place])  Weakness     Past Surgical History:  Procedure Laterality Date   CARDIAC CATHETERIZATION N/A 09/08/2015   Procedure: Temporary Pacemaker;  Surgeon: Leonie Man, MD;  Location: [place] [facility];  Service: Cardiovascular;  Laterality: N/A;   CARDIAC CATHETERIZATION N/A 09/08/2015   Procedure: Left Heart Cath and Coronary Angiography;  Surgeon: Leonie Man, MD;  Location: [place] [facility];  Service: Cardiovascular;  Laterality: N/A;   CARDIAC CATHETERIZATION N/A 09/08/2015   Procedure: Coronary Stent Intervention;  Surgeon: Leonie Man, MD;  Location: [place] [facility];  Service:  Cardiovascular;  Laterality: N/A;   CARDIAC CATHETERIZATION  09/08/2015   Procedure: Central Line Insertion;  Surgeon: Leonie Man, MD;  Location: [place] [facility];  Service: Cardiovascular;;   CARDIAC CATHETERIZATION  09/08/2015   Procedure: Arterial Line Insertion;  Surgeon: Leonie Man, MD;  Location: [place] [facility];  Service: Cardiovascular;;   CARDIAC CATHETERIZATION N/A 09/09/2015   Procedure: Temporary Wire;  Surgeon: Lorretta Harp, MD;  Location: [place] [facility];  Service: Cardiovascular;  Laterality: N/A;   COLONOSCOPY WITH PROPOFOL N/A 07/19/2020   Procedure: COLONOSCOPY WITH PROPOFOL;  Surgeon: Lin Landsman, MD;  Location: [facility] ENDOSCOPY;  Service: Gastroenterology;  Laterality: N/A;   ELBOW SURGERY     LEFT HEART CATH AND CORONARY ANGIOGRAPHY N/A 09/22/2017   Procedure: LEFT HEART CATH AND CORONARY ANGIOGRAPHY;  Surgeon: [name], MD;  Location: [place] [facility];  Service: Cardiovascular;  Laterality: N/A;   VISCERAL ANGIOGRAPHY N/A 08/07/2020   Procedure: VISCERAL ANGIOGRAPHY;  Surgeon: Katha Cabal, MD;  Location: [place] [facility];  Service: Cardiovascular;  Laterality: N/A;    Social History Social History   Tobacco Use   Smoking status: Never   Smokeless tobacco: Never  Vaping Use   Vaping Use: Never used  Substance Use Topics   Alcohol use: No    Alcohol/week: 0.0 standard drinks   Drug use: No    Family History Family History  Problem Relation Age of Onset   Hypertension Mother    Hyperlipidemia Mother    Stroke Mother    Diabetes Sister    Hypertension Sister    Hyperlipidemia Sister    Bipolar disorder Sister    Hyperlipidemia Sister    Stroke Sister    Prostate cancer Brother    Breast cancer Neg Hx     Allergies  Allergen Reactions   Codeine Nausea And Vomiting   Flomax [Tamsulosin] Other (See Comments)    Pt states that this medication gave her a kidney infection.     Lactose Intolerance (Gi) Other (See  Comments)    Bladder problem   Norco [Hydrocodone-Acetaminophen] Nausea And Vomiting   Sulfa Antibiotics Nausea And Vomiting   Atorvastatin Other (See Comments)    Leg pain, leg weakness     REVIEW OF SYSTEMS (Negative unless checked)  Constitutional: [] Weight loss  [] Fever  [] Chills Cardiac: [] Chest pain   [] Chest pressure   [] Palpitations   [] Shortness of breath when laying flat   [] Shortness of breath with exertion. Vascular:  [] Pain in legs with walking   [] Pain in legs at rest  [] History of DVT   [] Phlebitis   [] Swelling in legs   [] Varicose veins   [] Non-healing ulcers Pulmonary:   [] Uses home oxygen   [] Productive cough   [] Hemoptysis   [] Wheeze  [] COPD   [] Asthma Neurologic:  [] Dizziness   [] Seizures   [] History of stroke   [] History  of TIA  [] Aphasia   [] Vissual changes   [] Weakness or numbness in arm   [] Weakness or numbness in leg Musculoskeletal:   [] Joint swelling   [] Joint pain   [] Low back pain Hematologic:  [] Easy bruising  [] Easy bleeding   [] Hypercoagulable state   [] Anemic Gastrointestinal:  [] Diarrhea   [] Vomiting  [] Gastroesophageal reflux/heartburn   [] Difficulty swallowing. Genitourinary:  [] Chronic kidney disease   [] Difficult urination  [] Frequent urination   [] Blood in urine Skin:  [] Rashes   [] Ulcers  Psychological:  [] History of anxiety   []  History of major depression.  Physical Examination  There were no vitals filed for this visit. There is no height or weight on file to calculate BMI. Gen: WD/WN, NAD Head: [place]/AT, No temporalis wasting.  Ear/Nose/Throat: Hearing grossly intact, nares w/o erythema or drainage Eyes: PER, EOMI, sclera nonicteric.  Neck: Supple, no masses.  No bruit or JVD.  Pulmonary:  Good air movement, no audible wheezing, no use of accessory muscles.  Cardiac: RRR, normal S1, S2, no Murmurs. Vascular:   no carotid or abdominal bruit Vessel Right Left  Radial Palpable Palpable  PT Not Palpable Not Palpable  DP Not Palpable Not  Palpable  Gastrointestinal: soft, non-distended. No guarding/no peritoneal signs.  Musculoskeletal: M/S 5/5 throughout.  No visible deformity.  Neurologic: CN 2-12 intact. Pain and light touch intact in extremities.  Symmetrical.  Speech is fluent. Motor exam as listed above. Psychiatric: Judgment intact, Mood & affect appropriate for pt's clinical situation. Dermatologic: No rashes or ulcers noted.  No changes consistent with cellulitis.   CBC Lab Results  Component Value Date   WBC 3.8 (L) 07/20/2020   HGB 11.9 (L) 07/20/2020   HCT 34.5 (L) 07/20/2020   MCV 94.8 07/20/2020   PLT 81 (L) 07/20/2020    BMET    Component Value Date/Time   NA 137 11/18/2020 1033   NA 142 09/17/2017 1039   NA 136 03/26/2013 1808   K 5.0 11/18/2020 1033   K 4.2 03/26/2013 1808   CL 104 11/18/2020 1033   CL 100 03/26/2013 1808   CO2 26 11/18/2020 1033   CO2 30 03/26/2013 1808   GLUCOSE 100 (H) 11/18/2020 1033   GLUCOSE 104 (H) 03/26/2013 1808   BUN 14 11/18/2020 1033   BUN 13 09/17/2017 1039   BUN 15 03/26/2013 1808   CREATININE 0.92 11/18/2020 1033   CREATININE 0.67 03/26/2013 1808   CALCIUM 10.0 11/18/2020 1033   CALCIUM 10.8 (H) 03/26/2013 1808   GFRNONAA >60 11/18/2020 1033   GFRNONAA >60 03/26/2013 1808   GFRAA >60 12/06/2019 1304   GFRAA >60 03/26/2013 1808   CrCl cannot be calculated (Patient's most recent lab result is older than the maximum 21 days allowed.).  COAG Lab Results  Component Value Date   INR 1.0 12/02/2018   INR 1.1 09/17/2017   INR 1.18 09/06/2015    Radiology No results found.   Assessment/Plan 1. Superior mesenteric artery stenosis (HCC) Recommend:   The patient is status post successful angiogram with intervention of the mesenteric vessels.  SMA stent placement  was performed.  The patient reports that the abdominal pain is improved and the post prandial symptoms are essentially gone.   The patient denies lifestyle limiting changes at this point in  time.   No further invasive studies, angiography or surgery at this time The patient should continue walking and begin a more formal exercise program.  The patient should continue antiplatelet therapy and aggressive treatment of the  lipid abnormalities   Smoking cessation was again discussed   Patient should undergo noninvasive studies as ordered. The patient will follow up with me after the studies.  - VAS [place] MESENTERIC; Future  2. Atherosclerosis of native artery of both lower extremities with intermittent claudication (HCC) Recommend:   The patient has evidence of atherosclerosis of the lower extremities with claudication.  The patient does not voice lifestyle limiting changes at this point in time.   Noninvasive studies do not suggest clinically significant change.   No invasive studies, angiography or surgery at this time The patient should continue walking and begin a more formal exercise program.  The patient should continue antiplatelet therapy and aggressive treatment of the lipid abnormalities   No changes in the patient's medications at this time   The patient should continue wearing graduated compression socks 10-15 mmHg strength to control the mild edema.  - VAS [place] ABI WITH/WO TBI; Future  3. NSTEMI (non-ST elevated myocardial infarction) ([place]) Continue cardiac and antihypertensive medications as already ordered and reviewed, no changes at this time.  Continue statin as ordered and reviewed, no changes at this time  Nitrates PRN for chest pain   4. Essential hypertension Continue antihypertensive medications as already ordered, these medications have been reviewed and there are no changes at this time.   5. Hyperlipidemia with target LDL less than 70 Continue statin as ordered and reviewed, no changes at this time     [name], MD  12/10/2020 9:09 PM

## 2020-12-12 ENCOUNTER — Ambulatory Visit (INDEPENDENT_AMBULATORY_CARE_PROVIDER_SITE_OTHER): Payer: Medicare PPO

## 2020-12-12 ENCOUNTER — Other Ambulatory Visit (INDEPENDENT_AMBULATORY_CARE_PROVIDER_SITE_OTHER): Payer: Self-pay | Admitting: Vascular Surgery

## 2020-12-12 ENCOUNTER — Ambulatory Visit (INDEPENDENT_AMBULATORY_CARE_PROVIDER_SITE_OTHER): Payer: Medicare PPO | Admitting: Vascular Surgery

## 2020-12-12 ENCOUNTER — Encounter (INDEPENDENT_AMBULATORY_CARE_PROVIDER_SITE_OTHER): Payer: Self-pay | Admitting: Vascular Surgery

## 2020-12-12 ENCOUNTER — Other Ambulatory Visit: Payer: Self-pay

## 2020-12-12 VITALS — BP 158/80 | HR 69 | Ht 62.0 in | Wt 107.0 lb

## 2020-12-12 DIAGNOSIS — I70213 Atherosclerosis of native arteries of extremities with intermittent claudication, bilateral legs: Secondary | ICD-10-CM

## 2020-12-12 DIAGNOSIS — K551 Chronic vascular disorders of intestine: Secondary | ICD-10-CM | POA: Diagnosis not present

## 2020-12-12 DIAGNOSIS — Z95820 Peripheral vascular angioplasty status with implants and grafts: Secondary | ICD-10-CM | POA: Diagnosis not present

## 2020-12-12 DIAGNOSIS — I10 Essential (primary) hypertension: Secondary | ICD-10-CM

## 2020-12-12 DIAGNOSIS — K559 Vascular disorder of intestine, unspecified: Secondary | ICD-10-CM

## 2020-12-12 DIAGNOSIS — I214 Non-ST elevation (NSTEMI) myocardial infarction: Secondary | ICD-10-CM | POA: Diagnosis not present

## 2020-12-12 DIAGNOSIS — E785 Hyperlipidemia, unspecified: Secondary | ICD-10-CM

## 2020-12-17 NOTE — Progress Notes (Signed)
Cardiology Office Note:    Date:  12/18/2020   ID:  Teresa Franklin, DOB 1930/11/20, MRN [id number]  PCP:  [place] Cardiologist:  [name], MD  [place] Electrophysiologist:  None   Referring MD: Lorenza Cambridge*   Chief Complaint: 1 month follow-up  History of Present Illness:    Teresa Franklin is a 87 y.o. female with a hx of CAD s/p PCI/DES right posterior AV groove artery, CVA, HTN, CLD, HFrEF, ICM (LVEF 30-35% in 2018, normal by cath 2019, echo LVEF 40-45 in 06/2020) who presents for follow-up.    She presented in June 2017 with NSTEMI complicated by hypotension and high-grade AV block.  Cardiac catheterization showed occluded right posterior AV groove artery treated with PCI and DES.  She did not require PPM.  EF was normal at that time.  Also noted significant disease affecting proximal and mid LAD.  Recommended for medical therapy considering fetal status and involvement of the large diagonal branch at site of stenosis.  She also had CVA during the same hospitalization though no residual neurological deficits.   Due to worsening exertional dyspnea 2018 underwent nuclear stress test with no evidence of ischemia.  Evidence of prior infarct in lateral wall with EF 34%.  Followed by echo with LVEF 30-35% with severe hypokinesis of inferolateral and inferior wall with moderate mitral regurgitation no evidence of pulmonary hypertension.  LHC 09/2017 with widely patent RCA stent and no significant restenosis.  Moderate proximal to mid LAD disease estimated to be 60% which was actually improved compared to prior.  LVEDP normal.   She was seen 11/17/2018 by Dr. Fletcher Anon.  It was noted that her Coreg dose has been reduced due to hypotension.  She had also lost her husband since last seen and was continuing to grieve. Seen in follow up 01/2020 and doing overall well from cardiac perspective. No changes were made.    She was hospitalized 07/13/20-07/16/20 with  abdominal pain and recommended for outpatient follow up. She presented 07/17/20 with similar presentation and repeat CT progressive pancolonic wall thickening consistent with nonspecific infectious or inflammatory colitis. She was treated with antibiotics, colonoscopy with no evidence of colitis and resection of colon polyps. She had mildly elevated troponin likely demand ischemia with echo showing LVEF 40-45%.    Seen by Dr. Delana Meyer of vascular surgery due to abdominal pain and concern for mesenteric ischemia.  Per his review her CT showed SMA with approximately 70% stenosis.  She is scheduled for visceral angiography.    She was seen 08/06/20 for pre-op cardiac exam and was deemed an acceptable risk for procedure. She received a stent to superior mesenteric artery.  Last seen 11/11/20 and was doing well.Lisinopril was stopped and 2 days later she was started on Entresto.   Today, the patient reports she feels funny after taking Entresto, but this goes away. [name] feels dizzy, but unsure. She sometimes lays down after taking BP med. BP today is 150/70, she had her medication today. She does not take her bp at home. She denies chest pain or SOB. Check orthostatics today.   Past Medical History:  Diagnosis Date   Acute colitis 07/13/2020   Anxiety    AV block    a. 08/2015 in setting of NSTEMI and RPAV intervention-->required temp wire but not PPM.   Back pain    Chronic combined systolic (congestive) and diastolic (congestive) heart failure ([place])    a. 08/2015  Echo: EF  55-60%, no rwma, mod MR, mildly dil LA/RA, mod TR, PASP 49mmHg; b. 05/2016 Echo: EF 30-35%, inflat/inf sev HK, Gr1 DD; c. 08/2017 Echo: EF 30-35%, mild LVH. Sev inf/infseptal HK. Gr1 DD. Mod MR. mildly dil LA. Mildly reduced RV fxn.   Colitis 07/17/2020   Coronary artery disease    a. 08/2015 NSTEMI/PCI: LM nl, LAD 75p (med rx), 20 diffuse, 50d, LCX nl, OM1/2 nl, RCA 22m, RPAV 100 (2.25x12 Promus Premier DES) - complicated by 2:1 HB req  temp wire post-PCI; b. 04/2016 MV: EF 34%, lateral HK/infarct. No ischemia.   Diverticulitis    Generalized anxiety disorder    Hepatic steatosis    HLD (hyperlipidemia)    Hypertension    Hypokalemia    Hypomagnesemia    Hypothyroidism    Ischemic cardiomyopathy    a. 08/2015  Echo: EF 55-60%; b. 05/2016 Echo: EF 30-35%; c. 08/2017 Echo: EF 30-35%.   OA (osteoarthritis)    Osteopenia    Sleep apnea    Stroke [facility])    a. 08/2015 Right PCA territory infarct/temoral lobe cerebral infarction-->MRA showed severe flow liminting stenosis of R P2 cerebral artery;  b. 08/2015 Carotid U/S: 1-39% bilat ICA stenosis.   Subdural hemorrhage ([place])    a. 05/2013 in setting of fall.   Thrombocytopenia ([place])    Weakness     Past Surgical History:  Procedure Laterality Date   CARDIAC CATHETERIZATION N/A 09/08/2015   Procedure: Temporary Pacemaker;  Surgeon: Leonie Man, MD;  Location: [place] [facility];  Service: Cardiovascular;  Laterality: N/A;   CARDIAC CATHETERIZATION N/A 09/08/2015   Procedure: Left Heart Cath and Coronary Angiography;  Surgeon: Leonie Man, MD;  Location: [place] [facility];  Service: Cardiovascular;  Laterality: N/A;   CARDIAC CATHETERIZATION N/A 09/08/2015   Procedure: Coronary Stent Intervention;  Surgeon: Leonie Man, MD;  Location: [place] [facility];  Service: Cardiovascular;  Laterality: N/A;   CARDIAC CATHETERIZATION  09/08/2015   Procedure: Central Line Insertion;  Surgeon: Leonie Man, MD;  Location: [place] [facility];  Service: Cardiovascular;;   CARDIAC CATHETERIZATION  09/08/2015   Procedure: Arterial Line Insertion;  Surgeon: Leonie Man, MD;  Location: [place] [facility];  Service: Cardiovascular;;   CARDIAC CATHETERIZATION N/A 09/09/2015   Procedure: Temporary Wire;  Surgeon: Lorretta Harp, MD;  Location: [place] [facility];  Service: Cardiovascular;  Laterality: N/A;   COLONOSCOPY WITH PROPOFOL N/A 07/19/2020   Procedure: COLONOSCOPY WITH  PROPOFOL;  Surgeon: Lin Landsman, MD;  Location: [facility] ENDOSCOPY;  Service: Gastroenterology;  Laterality: N/A;   ELBOW SURGERY     LEFT HEART CATH AND CORONARY ANGIOGRAPHY N/A 09/22/2017   Procedure: LEFT HEART CATH AND CORONARY ANGIOGRAPHY;  Surgeon: [name], MD;  Location: [place] [facility];  Service: Cardiovascular;  Laterality: N/A;   VISCERAL ANGIOGRAPHY N/A 08/07/2020   Procedure: VISCERAL ANGIOGRAPHY;  Surgeon: Katha Cabal, MD;  Location: [place] [facility];  Service: Cardiovascular;  Laterality: N/A;    Current Medications: Current Meds  Medication Sig   acetaminophen (TYLENOL) 325 MG tablet Take 650 mg by mouth every 6 (six) hours as needed for mild pain, fever or headache.    ALPRAZolam (XANAX) 0.25 MG tablet Take 1 tablet (0.25 mg total) by mouth at bedtime as needed for anxiety.   aspirin 81 MG chewable tablet Chew 1 tablet (81 mg total) by mouth daily.   atorvastatin (LIPITOR) 20 MG tablet Take 1 tablet (20 mg  total) by mouth daily.   carvedilol (COREG) 6.25 MG tablet TAKE (1) TABLET BY MOUTH TWICE DAILY   clopidogrel (PLAVIX) 75 MG tablet TAKE 1 TABLET BY MOUTH ONCE DAILY   ezetimibe (ZETIA) 10 MG tablet Take 1 tablet (10 mg total) by mouth daily.   folic acid-pyridoxine-cyancobalamin (FOLBIC) 2.5-25-2 MG TABS tablet Take 1 tablet by mouth daily.   hydroxypropyl methylcellulose / hypromellose (ISOPTO TEARS / GONIOVISC) 2.5 % ophthalmic solution Place 1 drop into both eyes 4 (four) times daily as needed for dry eyes.   levothyroxine (SYNTHROID, LEVOTHROID) 25 MCG tablet Take 25 mcg by mouth daily before breakfast.    lisinopril (ZESTRIL) 30 MG tablet Take by mouth.   Multiple Vitamins-Minerals (MULTIVITAMIN GUMMIES ADULT) CHEW Chew 2 each by mouth at bedtime.   nitroGLYCERIN (NITROSTAT) 0.4 MG SL tablet Place 0.4 mg under the tongue every 5 (five) minutes as needed for chest pain.   omeprazole (PRILOSEC) 20 MG capsule Take 20 mg by mouth daily before  breakfast.    ondansetron (ZOFRAN) 4 MG tablet Take 1 tablet (4 mg total) by mouth every 8 (eight) hours as needed for nausea or vomiting.   oxybutynin (DITROPAN) 5 MG tablet Take 5 mg by mouth 2 (two) times daily.   polyethylene glycol (MIRALAX / GLYCOLAX) packet Take 17 g by mouth daily as needed for mild constipation.   sacubitril-valsartan (ENTRESTO) 24-26 MG Take 1 tablet by mouth 2 (two) times daily.   Simethicone 80 MG TABS Take 80 mg by mouth daily as needed (gas).      Allergies:   Codeine, Flomax [tamsulosin], Lactose intolerance (gi), Norco [hydrocodone-acetaminophen], Sulfa antibiotics, and Atorvastatin   Social History   Socioeconomic History   Marital status: Widowed    Spouse name: Not on file   Number of children: Not on file   Years of education: Not on file   Highest education level: Not on file  Occupational History   Not on file  Tobacco Use   Smoking status: Never   Smokeless tobacco: Never  Vaping Use   Vaping Use: Never used  Substance and Sexual Activity   Alcohol use: No    Alcohol/week: 0.0 standard drinks   Drug use: No   Sexual activity: Not Currently    Birth control/protection: None  Other Topics Concern   Not on file  Social History Narrative   Not on file   Social Determinants of Health   Financial Resource Strain: Not on file  Food Insecurity: Not on file  Transportation Needs: Not on file  Physical Activity: Not on file  Stress: Not on file  Social Connections: Not on file     Family History: The patient's family history includes Bipolar disorder in her sister; Diabetes in her sister; Hyperlipidemia in her mother, sister, and sister; Hypertension in her mother and sister; Prostate cancer in her brother; Stroke in her mother and sister. There is no history of Breast cancer.  ROS:   Please see the history of present illness.     All other systems reviewed and are negative.  EKGs/Labs/Other Studies Reviewed:    The following  studies were reviewed today:    Echo limited 07/17/20   1. Left ventricular ejection fraction, by estimation, is 40 to 45%. The  left ventricle has mild to moderately decreased function. The left  ventricle demonstrates regional wall motion abnormalities (see scoring  diagram/findings for description). There is   mild left ventricular hypertrophy. Left ventricular diastolic parameters  are consistent  with Grade I diastolic dysfunction (impaired relaxation).  There is severe hypokinesis of the left ventricular, basal-mid  inferolateral wall and inferior wall.   2. Mild mitral valve regurgitation.   3. The aortic valve was not well visualized. Aortic valve regurgitation  is mild. Mild aortic valve sclerosis is present, with no evidence of  aortic valve stenosis.    [place] 09/2017   Previously placed Post Atrio drug eluting stent is widely patent. Balloon angioplasty was performed. Prox LAD to Mid LAD lesion is 50% stenosed. Ost 1st Diag to 1st Diag lesion is 20% stenosed. Ost RPDA to RPDA lesion is 60% stenosed. Prox RCA to Mid RCA lesion is 20% stenosed.   1.  Widely patent RCA stent with no significant restenosis.  Moderate proximal to mid LAD disease estimated to be 60% which seems to be actually better than last time. 2.  Normal ejection fraction by echo.  Left ventricular angiography was not performed.  Normal left ventricular end-diastolic pressure.   Recommendations: Continue medical therapy for coronary artery disease and ischemic cardiomyopathy.  No revascularization is needed.   Echo 08/2017 Study Conclusions   - Left ventricle: The cavity size was mildly dilated. Wall    thickness was increased in a pattern of mild LVH. Systolic    function was moderately to severely reduced. The estimated    ejection fraction was in the range of 30% to 35%. There is severe    hypokinesis of the inferior and inferoseptal myocardium. Doppler    parameters are consistent with abnormal left  ventricular    relaxation (grade 1 diastolic dysfunction).  - Aortic valve: Trileaflet; mildly thickened, mildly calcified    leaflets. Transvalvular velocity was within the normal range.    There was no stenosis. There was no significant regurgitation.  - Mitral valve: Calcified annulus. Mildly thickened leaflets .    There was moderate regurgitation.  - Left atrium: The atrium was mildly dilated.  - Right ventricle: The cavity size was normal. Wall thickness was    mildly increased. Systolic function was mildly reduced.   EKG:  EKG is  ordered today.  The ekg ordered today demonstrates NSR, 60bpm, PAC, nonspecific T wave changes  Recent Labs: 07/14/2020: TSH 0.593 07/17/2020: ALT 14 07/20/2020: Hemoglobin 11.9; Magnesium 1.8; Platelets 81 11/18/2020: BUN 14; Creatinine, Ser 0.92; Potassium 5.0; Sodium 137  Recent Lipid Panel    Component Value Date/Time   CHOL 114 09/09/2015 0350   TRIG 111 09/09/2015 0350   HDL 38 (L) 09/09/2015 0350   CHOLHDL 3.0 09/09/2015 0350   VLDL 22 09/09/2015 0350   LDLCALC 54 09/09/2015 0350    Physical Exam:    VS:  BP (!) 150/70 (BP Location: Left Arm, Patient Position: Sitting, Cuff Size: Normal)   Pulse 60   Ht 5' 2.5" (1.588 m)   Wt 108 lb (49 kg)   SpO2 92%   BMI 19.44 kg/m     Wt Readings from Last 3 Encounters:  12/18/20 108 lb (49 kg)  12/12/20 107 lb (48.5 kg)  11/11/20 107 lb (48.5 kg)     GEN:  Well nourished, well developed in no acute distress HEENT: Normal NECK: No JVD; No carotid bruits LYMPHATICS: No lymphadenopathy CARDIAC: RRR, no murmurs, rubs, gallops RESPIRATORY:  Clear to auscultation without rales, wheezing or rhonchi  ABDOMEN: Soft, non-tender, non-distended MUSCULOSKELETAL:  No edema; No deformity  SKIN: Warm and dry NEUROLOGIC:  Alert and oriented x 3 PSYCHIATRIC:  Normal affect   ASSESSMENT:  1. Chronic systolic heart failure ([place])   2. History of CVA (cerebrovascular accident)   3. Essential  hypertension   4. Coronary artery disease involving native coronary artery of native heart with angina pectoris ([place])   5. Superior mesenteric artery stenosis ([facility])    PLAN:    In order of problems listed above:  CAD s/p PCI/DES right posterior AV groove artery in 2017 Patient denies anginal symptoms. Cath 09/2017 showed patent RCA stent with no significant restenosis, moderate proximal mid LAD disease less than 60%, normal EF. EKG with no ischemic changes. No further ischemic work-up at this time. Continue Aspirin, plavix (DAPT for SMA stent), statin, BB and Zetia.   HFrEF ICM Entresto added at the last visit and says she feels weird, possibly dizzy, after taking Entresto. BP today elevated 150/70 and she had medications today. Orthostatics negative. Recommend she take BP at home for a week and call in to report. Unsure we can add on Farxiga/spiro if she continues to feel dizzy.   HLD H/o leg weakness on atorvastatin so she is on 20mg  daily. LDL 85 at last check. Zetia restarted at the last visit. Can re-check at follow-up.   H/o CVA PAD  S/p mesenteric stenting. Continue Aspirin, plavix and statin.   HTN BP above goal, but she is feeling possible dizziness after taking Entresto. Orthostatics negative as above. She will check BP at home for a week and report back,   Disposition: Follow up in 1 month(s) with MD/APP    Signed, Trisha Ken Ninfa Meeker, PA-C  12/18/2020 3:57 PM    [place]

## 2020-12-18 ENCOUNTER — Other Ambulatory Visit: Payer: Self-pay

## 2020-12-18 ENCOUNTER — Encounter: Payer: Self-pay | Admitting: Medical

## 2020-12-18 ENCOUNTER — Ambulatory Visit: Payer: Medicare PPO | Admitting: Medical

## 2020-12-18 VITALS — BP 150/70 | HR 60 | Ht 62.5 in | Wt 108.0 lb

## 2020-12-18 DIAGNOSIS — Z8673 Personal history of transient ischemic attack (TIA), and cerebral infarction without residual deficits: Secondary | ICD-10-CM | POA: Diagnosis not present

## 2020-12-18 DIAGNOSIS — I5022 Chronic systolic (congestive) heart failure: Secondary | ICD-10-CM | POA: Diagnosis not present

## 2020-12-18 DIAGNOSIS — I10 Essential (primary) hypertension: Secondary | ICD-10-CM

## 2020-12-18 DIAGNOSIS — I25119 Atherosclerotic heart disease of native coronary artery with unspecified angina pectoris: Secondary | ICD-10-CM | POA: Diagnosis not present

## 2020-12-18 DIAGNOSIS — K551 Chronic vascular disorders of intestine: Secondary | ICD-10-CM

## 2020-12-18 NOTE — Patient Instructions (Signed)
Medication Instructions:  Your physician recommends that you continue on your current medications as directed. Please refer to the Current Medication list given to you today.  *If you need a refill on your cardiac medications before your next appointment, please call your pharmacy*   Lab Work: None ordered If you have labs (blood work) drawn today and your tests are completely normal, you will receive your results only by: [place] (if you have MyChart) OR A paper copy in the mail If you have any lab test that is abnormal or we need to change your treatment, we will call you to review the results.   Testing/Procedures: None ordered   Follow-Up: At [facility], you and your health needs are our priority.  As part of our continuing mission to provide you with exceptional heart care, we have created designated Provider Care Teams.  These Care Teams include your primary Cardiologist (physician) and Advanced Practice Providers (APPs -  Physician Assistants and Nurse Practitioners) who all work together to provide you with the care you need, when you need it.  We recommend signing up for the patient portal called "MyChart".  Sign up information is provided on this After Visit Summary.  MyChart is used to connect with patients for Virtual Visits (Telemedicine).  Patients are able to view lab/test results, encounter notes, upcoming appointments, etc.  Non-urgent messages can be sent to your provider as well.   To learn more about what you can do with MyChart, go to [email or web address].    Your next appointment:   4 week(s)  The format for your next appointment:   In Person  Provider:   You may see [name], MD or one of the following Advanced Practice Providers on your designated Care Team:   Murray Hodgkins, NP Christell Faith, PA-C Marrianne Mood, PA-C Cadence Kathlen Mody, [place]   Other Instructions Your physician has requested that you monitor and record your blood  pressure reading daily 2 hours after taking you medications. Please use the same machine at the same time of day to check your readings and record them. Please call the office in 7 days to provide the readings.

## 2020-12-25 ENCOUNTER — Telehealth: Payer: Self-pay | Admitting: Medical

## 2020-12-25 NOTE — Telephone Encounter (Signed)
BP log 2 hrs s/p meds   09/29  156/85  09/30  151/71  10/1  138/68    10/2  117/65  10/3  106/65  10/4  151/80    10/5  149/88

## 2020-12-27 MED ORDER — CARVEDILOL 6.25 MG PO TABS
ORAL_TABLET | ORAL | Status: DC
Start: 2020-12-27 — End: 2021-01-01

## 2020-12-27 NOTE — Telephone Encounter (Signed)
Patient calling to check on status.

## 2020-12-27 NOTE — Telephone Encounter (Signed)
Patient calling back to report additional pressures  10/7  182/96 before meds   160/79 after meds    Patient expresses concerns about not receiving a follow call back to go over bp.  Patient also concerned she has been scheduled with APP's for a while and family is starting to get upset because she has not been seen by Dr. Fletcher Anon.   Patient advised additional results will be sent for review and frustration and concerns will be noted.

## 2020-12-27 NOTE — Telephone Encounter (Signed)
Furth, Cadence H, PA-C  Wynema Birch, RN 19 hours ago (9:41 PM)   BPS look good, not too low. overall reassuring

## 2020-12-27 NOTE — Telephone Encounter (Signed)
Spoke with the patient and her son.  She was to provide a weeks worth of BP readings after the patients 12/18/20 apt with Cadence Kathlen Mody, PA. Advised the patient that her BP readings have been reviewed by CF and she thinks they look good.  Patient sts that her son is upset that he BP has been running really high and is not being well managed. Per Cadences 12/18/20 note:  Entresto added at the last visit and says she feels weird, possibly dizzy, after taking Entresto. BP today elevated 150/70 and she had medications today. Orthostatics negative. Recommend she take BP at home for a week and call in to report. Unsure we can add on Farxiga/spiro if she continues to feel dizzy.   Patient sts that she is not really dizzy anymore but her legs do feel weak at times. She denies falls, she does not ambulate with a cane or walker. Advised her to change positions slowly and remove any tripping hazards from the floor (like throw rugs)  Patient provided todays BP readings.  8 am before medication 182/96 One hour after meds 160/79  I asked the patient to check her BP and HR while I held the line. 190/106 81 bpm. Since we are closed until Monday, I advised her to go ahead and take her pm Carvedilol now and to recheck her BP in 3 hours if >150/90 she should call in to our on call provider for further recommendation.  Patients son is upset that the patient has not seen Dr. Fletcher Anon since 2020 and has only see APPs. Rescheduled the patients 01/17/21 appt with Cadence to Dr. Fletcher Anon on 01/16/21 @ 11:40 am.  Adv the patient and her son that I will fwd the message to Dr. Fletcher Anon for further recommendation.  Patient voiced appreciation.

## 2020-12-27 NOTE — Telephone Encounter (Signed)
Spoke with the patient and her son.  She was to provide a weeks worth of BP readings after the patients 12/18/20 apt with Cadence Kathlen Mody, PA.

## 2020-12-28 ENCOUNTER — Other Ambulatory Visit (INDEPENDENT_AMBULATORY_CARE_PROVIDER_SITE_OTHER): Payer: Self-pay | Admitting: Vascular Surgery

## 2020-12-30 NOTE — Telephone Encounter (Signed)
If blood pressure remains elevated upon follow-up, we can increase Entresto if she tolerates it.  If she still having issues with Entresto, we might have to switch to a different antihypertensive medication.  Continue to record blood pressure at home 3-4 times per week and bring the readings to the office visit.

## 2020-12-31 NOTE — Telephone Encounter (Signed)
Spoke with the patient and made her aware of Dr. Tyrell Antonio response and recommendation.   Patient sts that she has doubled her Carvedilol dosage to 12.5 mg bid and that her BP has been a little better before meds 190/98 2 hours after medication 150/92. Patient sts that she was told to increase her Carvedilol by someone, and she wants to know if she should continue the higher dose. Adv her that I do not see any documentation of this. This RN spoke with the patient on 12/27/20 I did not instruct her to increase her Carvedilol, I did adv her to go ahead and take her pm dose earlier than she normally does.  Patient became upset stating that someone told her to increase it and that "you but are just ridiculous." Adv the patient that I will update Dr. Fletcher Anon regarding the increased dosage of Carvedilol and call back with his recommendation.  Spoke with Dr. Fletcher Anon. Per Dr. Fletcher Anon she can continue Carvedilol 12.5 mg bid.

## 2020-12-31 NOTE — Telephone Encounter (Signed)
Called to give the patient Dr. Tyrell Antonio recommendation. Lmtcb.

## 2021-01-01 MED ORDER — CARVEDILOL 12.5 MG PO TABS: 12.5000 mg | ORAL_TABLET | Freq: Two times a day (BID) | ORAL | Status: DC

## 2021-01-01 NOTE — Telephone Encounter (Signed)
Called the patient and made her aware. Per Dr. Fletcher Anon she can continue the 2 tabs 12.5 mg bid of carvedilol. She should continue to monitor her BP and keep her upcoming appt. Patient verbalized understanding.

## 2021-01-16 ENCOUNTER — Other Ambulatory Visit: Payer: Self-pay

## 2021-01-16 ENCOUNTER — Ambulatory Visit: Payer: Medicare PPO | Admitting: Cardiovascular Disease

## 2021-01-16 ENCOUNTER — Encounter: Payer: Self-pay | Admitting: Cardiovascular Disease

## 2021-01-16 VITALS — BP 160/80 | HR 77 | Ht 62.5 in | Wt 105.5 lb

## 2021-01-16 DIAGNOSIS — E785 Hyperlipidemia, unspecified: Secondary | ICD-10-CM

## 2021-01-16 DIAGNOSIS — I10 Essential (primary) hypertension: Secondary | ICD-10-CM

## 2021-01-16 DIAGNOSIS — I25118 Atherosclerotic heart disease of native coronary artery with other forms of angina pectoris: Secondary | ICD-10-CM

## 2021-01-16 DIAGNOSIS — Z8673 Personal history of transient ischemic attack (TIA), and cerebral infarction without residual deficits: Secondary | ICD-10-CM | POA: Diagnosis not present

## 2021-01-16 DIAGNOSIS — K551 Chronic vascular disorders of intestine: Secondary | ICD-10-CM

## 2021-01-16 DIAGNOSIS — I5022 Chronic systolic (congestive) heart failure: Secondary | ICD-10-CM

## 2021-01-16 MED ORDER — ENTRESTO 49-51 MG PO TABS: 1.0000 | ORAL_TABLET | Freq: Two times a day (BID) | ORAL | 11 refills | Status: DC

## 2021-01-16 MED ORDER — CARVEDILOL 25 MG PO TABS: 25.0000 mg | ORAL_TABLET | Freq: Two times a day (BID) | ORAL | 3 refills | Status: DC

## 2021-01-16 NOTE — Progress Notes (Signed)
Cardiology Office Note   Date:  01/16/2021   ID:  Teresa, Franklin 12-04-1930, MRN [id number]  PCP:  [facility]  Cardiologist:   [name], MD   Chief Complaint  Patient presents with   Other    4 wk f/u c/o BP elevated in th am. Meds reviewed verbally with pt.       History of Present Illness: Teresa Franklin is a 87 y.o. female who presents for a follow-up visit regarding coronary artery disease. She presented in June 2017 with non-ST elevation myocardial infarction which was complicated by hypotension and high-grade AV block. Cardiac catheterization showed an occluded right posterior AV groove artery which was treated successfully with PCI and drug-eluting stent placement. She did not require a permanent pacemaker. Ejection fraction was normal. There was significant disease affecting the proximal and mid LAD. Medical therapy was recommended considering her frail status and involvement of a large diagonal branch at the site of stenosis.  She also had a stroke during the same hospitalization.  Currently with no residual neurologic deficits.  She had worsening exertional dyspnea in Jun 26, 2016.    Nuclear stress test showed no evidence of ischemia. There was evidence of prior infarct in the lateral wall with an ejection fraction of 34%. This was followed by an echocardiogram which showed an ejection fraction of 30-35% with severe hypokinesis of the inferolateral and inferior wall, moderate mitral regurgitation and no evidence of pulmonary hypertension. Left heart catheterization was done in July 2019 which showed widely patent RCA stent with no significant restenosis.  There was moderate proximal to mid LAD disease estimated to be 60% which appeared better than before.  LVEDP was normal.  Her husband died in 2018-06-27 and she had significant grief after that.  She had hypotension that required decreasing the dose of carvedilol. She was hospitalized in April with abdominal pain  suspected to be due to colitis.  She was treated with antibiotics. She ultimately underwent stent placement to the SMA for suspected chronic mesenteric ischemia. Most recent echocardiogram in April showed an EF of 40 to 45%.  She was switched from lisinopril to [facility]. She reports feeling tired with low energy over the last few months.  She has poor appetite and has gradually lost weight.  No chest pain.  She never had chest pain with her myocardial infarction and her symptoms were mostly nausea and vomiting.  Past Medical History:  Diagnosis Date   Acute colitis 07/13/2020   Anxiety    AV block    a. 08/2015 in setting of NSTEMI and RPAV intervention-->required temp wire but not PPM.   Back pain    Chronic combined systolic (congestive) and diastolic (congestive) heart failure ([place])    a. 08/2015  Echo: EF 55-60%, no rwma, mod MR, mildly dil LA/RA, mod TR, PASP 52mmHg; b. 06-26-16 Echo: EF 30-35%, inflat/inf sev HK, Gr1 DD; c. 08/2017 Echo: EF 30-35%, mild LVH. Sev inf/infseptal HK. Gr1 DD. Mod MR. mildly dil LA. Mildly reduced RV fxn.   Colitis 07/17/2020   Coronary artery disease    a. 08/2015 NSTEMI/PCI: LM nl, LAD 75p (med rx), 20 diffuse, 50d, LCX nl, OM1/2 nl, RCA 59m, RPAV 100 (2.25x12 Promus Premier DES) - complicated by 2:1 HB req temp wire post-PCI; b. 04/2016 MV: EF 34%, lateral HK/infarct. No ischemia.   Diverticulitis    Generalized anxiety disorder    Hepatic steatosis    HLD (hyperlipidemia)    Hypertension  Hypokalemia    Hypomagnesemia    Hypothyroidism    Ischemic cardiomyopathy    a. 08/2015  Echo: EF 55-60%; b. 05/2016 Echo: EF 30-35%; c. 08/2017 Echo: EF 30-35%.   OA (osteoarthritis)    Osteopenia    Sleep apnea    Stroke [facility])    a. 08/2015 Right PCA territory infarct/temoral lobe cerebral infarction-->MRA showed severe flow liminting stenosis of R P2 cerebral artery;  b. 08/2015 Carotid U/S: 1-39% bilat ICA stenosis.   Subdural hemorrhage ([place])    a. 05/2013 in setting  of fall.   Thrombocytopenia ([place])    Weakness     Past Surgical History:  Procedure Laterality Date   CARDIAC CATHETERIZATION N/A 09/08/2015   Procedure: Temporary Pacemaker;  Surgeon: Leonie Man, MD;  Location: [place] [facility];  Service: Cardiovascular;  Laterality: N/A;   CARDIAC CATHETERIZATION N/A 09/08/2015   Procedure: Left Heart Cath and Coronary Angiography;  Surgeon: Leonie Man, MD;  Location: [place] [facility];  Service: Cardiovascular;  Laterality: N/A;   CARDIAC CATHETERIZATION N/A 09/08/2015   Procedure: Coronary Stent Intervention;  Surgeon: Leonie Man, MD;  Location: [place] [facility];  Service: Cardiovascular;  Laterality: N/A;   CARDIAC CATHETERIZATION  09/08/2015   Procedure: Central Line Insertion;  Surgeon: Leonie Man, MD;  Location: [place] [facility];  Service: Cardiovascular;;   CARDIAC CATHETERIZATION  09/08/2015   Procedure: Arterial Line Insertion;  Surgeon: Leonie Man, MD;  Location: [place] [facility];  Service: Cardiovascular;;   CARDIAC CATHETERIZATION N/A 09/09/2015   Procedure: Temporary Wire;  Surgeon: Lorretta Harp, MD;  Location: [place] [facility];  Service: Cardiovascular;  Laterality: N/A;   COLONOSCOPY WITH PROPOFOL N/A 07/19/2020   Procedure: COLONOSCOPY WITH PROPOFOL;  Surgeon: Lin Landsman, MD;  Location: [facility] ENDOSCOPY;  Service: Gastroenterology;  Laterality: N/A;   ELBOW SURGERY     LEFT HEART CATH AND CORONARY ANGIOGRAPHY N/A 09/22/2017   Procedure: LEFT HEART CATH AND CORONARY ANGIOGRAPHY;  Surgeon: [name], MD;  Location: [place] [facility];  Service: Cardiovascular;  Laterality: N/A;   VISCERAL ANGIOGRAPHY N/A 08/07/2020   Procedure: VISCERAL ANGIOGRAPHY;  Surgeon: Katha Cabal, MD;  Location: [place] [facility];  Service: Cardiovascular;  Laterality: N/A;     Current Outpatient Medications  Medication Sig Dispense Refill   acetaminophen (TYLENOL) 325 MG tablet Take 650 mg by mouth every 6  (six) hours as needed for mild pain, fever or headache.      ALPRAZolam (XANAX) 0.25 MG tablet Take 1 tablet (0.25 mg total) by mouth at bedtime as needed for anxiety. 15 tablet 0   aspirin 81 MG chewable tablet Chew 1 tablet (81 mg total) by mouth daily.     atorvastatin (LIPITOR) 20 MG tablet Take 1 tablet (20 mg total) by mouth daily. 90 tablet 1   clopidogrel (PLAVIX) 75 MG tablet TAKE 1 TABLET BY MOUTH EVERY DAY 90 tablet 1   ezetimibe (ZETIA) 10 MG tablet Take 1 tablet (10 mg total) by mouth daily. 90 tablet 3   folic acid-pyridoxine-cyancobalamin (FOLBIC) 2.5-25-2 MG TABS tablet Take 1 tablet by mouth daily.     hydroxypropyl methylcellulose / hypromellose (ISOPTO TEARS / GONIOVISC) 2.5 % ophthalmic solution Place 1 drop into both eyes 4 (four) times daily as needed for dry eyes.     levothyroxine (SYNTHROID, LEVOTHROID) 25 MCG tablet Take 25 mcg by mouth daily before breakfast.      Multiple Vitamins-Minerals (  MULTIVITAMIN GUMMIES ADULT) CHEW Chew 2 each by mouth at bedtime.     nitroGLYCERIN (NITROSTAT) 0.4 MG SL tablet Place 0.4 mg under the tongue every 5 (five) minutes as needed for chest pain.     omeprazole (PRILOSEC) 20 MG capsule Take 20 mg by mouth daily before breakfast.      ondansetron (ZOFRAN) 4 MG tablet Take 1 tablet (4 mg total) by mouth every 8 (eight) hours as needed for nausea or vomiting. 14 tablet 0   oxybutynin (DITROPAN) 5 MG tablet Take 5 mg by mouth 2 (two) times daily.     polyethylene glycol (MIRALAX / GLYCOLAX) packet Take 17 g by mouth daily as needed for mild constipation. 14 each 0   sacubitril-valsartan (ENTRESTO) 24-26 MG Take 1 tablet by mouth 2 (two) times daily. 60 tablet 6   Simethicone 80 MG TABS Take 80 mg by mouth daily as needed (gas).      No current facility-administered medications for this visit.    Allergies:   Codeine, Flomax [tamsulosin], Lactose intolerance (gi), Norco [hydrocodone-acetaminophen], Sulfa antibiotics, and Atorvastatin     Social History:  The patient  reports that she has never smoked. She has never used smokeless tobacco. She reports that she does not drink alcohol and does not use drugs.   Family History:  The patient's family history includes Bipolar disorder in her sister; Diabetes in her sister; Hyperlipidemia in her mother, sister, and sister; Hypertension in her mother and sister; Prostate cancer in her brother; Stroke in her mother and sister.    ROS:  Please see the history of present illness.   Otherwise, review of systems are positive for none.   All other systems are reviewed and negative.    PHYSICAL EXAM: VS:  BP (!) 160/80 (BP Location: Left Arm, Patient Position: Sitting, Cuff Size: Normal)   Pulse 77   Ht 5' 2.5" (1.588 m)   Wt 105 lb 8 oz (47.9 kg)   SpO2 97%   BMI 18.99 kg/m  , BMI Body mass index is 18.99 kg/m. GEN: Well nourished, well developed, in no acute distress  HEENT: normal  Neck: no JVD, carotid bruits, or masses Cardiac: RRR; 1/6 systolic ejection murmur in the aortic area, no rubs, or gallops,no edema  Respiratory:  clear to auscultation bilaterally, normal work of breathing GI: soft, nontender, nondistended, + BS MS: no deformity or atrophy  Skin: warm and dry, no rash Neuro:  Strength and sensation are intact Psych: euthymic mood, full affect   EKG:  EKG is not ordered today.    Recent Labs: 07/14/2020: TSH 0.593 07/17/2020: ALT 14 07/20/2020: Hemoglobin 11.9; Magnesium 1.8; Platelets 81 11/18/2020: BUN 14; Creatinine, Ser 0.92; Potassium 5.0; Sodium 137    Lipid Panel    Component Value Date/Time   CHOL 114 09/09/2015 0350   TRIG 111 09/09/2015 0350   HDL 38 (L) 09/09/2015 0350   CHOLHDL 3.0 09/09/2015 0350   VLDL 22 09/09/2015 0350   LDLCALC 54 09/09/2015 0350      Wt Readings from Last 3 Encounters:  01/16/21 105 lb 8 oz (47.9 kg)  12/18/20 108 lb (49 kg)  12/12/20 107 lb (48.5 kg)       ASSESSMENT AND PLAN:  1.  Coronary artery  disease involving native coronary arteries with other forms of angina:    She is currently doing very well with no recurrent anginal symptoms.  Continue medical therapy.  2. Chronic systolic  heart failure:   She appears  to be euvolemic without furosemide.  Blood pressure continues to be elevated.  Continue carvedilol 25 mg twice daily.  New prescription was sent.  I increase Entresto to 49/51 mg twice daily.  3. Hyperlipidemia: Continue treatment with atorvastatin and Zetia.  Most recent lipid profile showed an LDL of 85.  This is close to target.  4.  Essential hypertension: Blood pressure is elevated in the office and at home.  I increased Entresto.  5.  Status post SMA stent for chronic mesenteric ischemia: She thinks clopidogrel has been making her feel worse.  It has been more than 6 months since stent placement.  I discontinued clopidogrel.  Continue low-dose aspirin.  Disposition:   FU with me in 1 months  Signed,  [name], MD  01/16/2021 12:02 PM    [place]

## 2021-01-16 NOTE — Patient Instructions (Addendum)
Medication Instructions:  Please STOP Plavix  Please Increase Carvedilol (Coreg) Up to 25 mg twice a day Entresto Up to 49-51 mg twice a day  *If you need a refill on your cardiac medications before your next appointment, please call your pharmacy*  Lab Work: CBC & BMP  Testing/Procedures: None  Follow-Up: At [facility], you and your health needs are our priority.  As part of our continuing mission to provide you with exceptional heart care, we have created designated Provider Care Teams.  These Care Teams include your primary Cardiologist (physician) and Advanced Practice Providers (APPs -  Physician Assistants and Nurse Practitioners) who all work together to provide you with the care you need, when you need it.  We recommend signing up for the patient portal called "MyChart".  Sign up information is provided on this After Visit Summary.  MyChart is used to connect with patients for Virtual Visits (Telemedicine).  Patients are able to view lab/test results, encounter notes, upcoming appointments, etc.  Non-urgent messages can be sent to your provider as well.   To learn more about what you can do with MyChart, go to [email or web address].    Your next appointment:   1 month(s)  The format for your next appointment:   In Person  Provider:   You may see [name], MD or one of the following Advanced Practice Providers on your designated Care Team:   Murray Hodgkins, NP Christell Faith, PA-C Cadence Kathlen Mody, [place]

## 2021-01-17 ENCOUNTER — Ambulatory Visit: Payer: Medicare PPO | Admitting: Medical

## 2021-01-17 LAB — BASIC METABOLIC PANEL
BUN/Creatinine Ratio: 17 (ref 12–28)
BUN: 13 mg/dL (ref 8–27)
CO2: 23 mmol/L (ref 20–29)
Calcium: 10.5 mg/dL — ABNORMAL HIGH (ref 8.7–10.3)
Chloride: 102 mmol/L (ref 96–106)
Creatinine, Ser: 0.75 mg/dL (ref 0.57–1.00)
Glucose: 109 mg/dL — ABNORMAL HIGH (ref 70–99)
Potassium: 4 mmol/L (ref 3.5–5.2)
Sodium: 137 mmol/L (ref 134–144)
eGFR: 77 mL/min/{1.73_m2} (ref >59)

## 2021-01-17 LAB — CBC
Hematocrit: 39.5 % (ref 34.0–46.6)
Hemoglobin: 13.4 g/dL (ref 11.1–15.9)
MCH: 31.5 pg (ref 26.6–33.0)
MCHC: 33.9 g/dL (ref 31.5–35.7)
MCV: 93 fL (ref 79–97)
Platelets: 54 10*3/uL — CL (ref 150–450)
RBC: 4.25 x10E6/uL (ref 3.77–5.28)
RDW: 13 % (ref 11.7–15.4)
WBC: 3.2 10*3/uL — ABNORMAL LOW (ref 3.4–10.8)

## 2021-01-22 ENCOUNTER — Telehealth: Payer: Self-pay | Admitting: Oncology

## 2021-01-22 NOTE — Telephone Encounter (Signed)
See me in 2 weeks with cbc

## 2021-01-22 NOTE — Telephone Encounter (Signed)
Left VM with patient to make her aware of appointment scheduled. Requested she call back to confirm.

## 2021-01-22 NOTE — Telephone Encounter (Signed)
Pt called to get set up for platlets. Primany MD said they were high. Please give a call back at [phone]

## 2021-01-23 ENCOUNTER — Other Ambulatory Visit: Payer: Self-pay | Admitting: *Deleted

## 2021-01-23 DIAGNOSIS — D693 Immune thrombocytopenic purpura: Secondary | ICD-10-CM

## 2021-01-23 NOTE — Telephone Encounter (Signed)
I called her and got her on phone and she got the message for the appt 11/21 and she will be there

## 2021-02-10 ENCOUNTER — Inpatient Hospital Stay: Payer: Medicare PPO | Attending: Oncology

## 2021-02-10 ENCOUNTER — Encounter: Payer: Self-pay | Admitting: Oncology

## 2021-02-10 ENCOUNTER — Inpatient Hospital Stay: Payer: Medicare PPO | Admitting: Oncology

## 2021-02-10 ENCOUNTER — Other Ambulatory Visit: Payer: Self-pay

## 2021-02-10 VITALS — BP 131/68 | HR 63 | Temp 98.3°F | Resp 14 | Wt 107.8 lb

## 2021-02-10 DIAGNOSIS — Z79899 Other long term (current) drug therapy: Secondary | ICD-10-CM | POA: Diagnosis not present

## 2021-02-10 DIAGNOSIS — Z7982 Long term (current) use of aspirin: Secondary | ICD-10-CM | POA: Insufficient documentation

## 2021-02-10 DIAGNOSIS — D472 Monoclonal gammopathy: Secondary | ICD-10-CM

## 2021-02-10 DIAGNOSIS — D693 Immune thrombocytopenic purpura: Secondary | ICD-10-CM | POA: Insufficient documentation

## 2021-02-10 LAB — CBC WITH DIFFERENTIAL/PLATELET
Abs Immature Granulocytes: 0.01 10*3/uL (ref 0.00–0.07)
Basophils Absolute: 0 10*3/uL (ref 0.0–0.1)
Basophils Relative: 0 %
Eosinophils Absolute: 0.1 10*3/uL (ref 0.0–0.5)
Eosinophils Relative: 1 %
HCT: 38.5 % (ref 36.0–46.0)
Hemoglobin: 12.6 g/dL (ref 12.0–15.0)
Immature Granulocytes: 0 %
Lymphocytes Relative: 17 %
Lymphs Abs: 0.6 10*3/uL — ABNORMAL LOW (ref 0.7–4.0)
MCH: 31.4 pg (ref 26.0–34.0)
MCHC: 32.7 g/dL (ref 30.0–36.0)
MCV: 96 fL (ref 80.0–100.0)
Monocytes Absolute: 0.2 10*3/uL (ref 0.1–1.0)
Monocytes Relative: 6 %
Neutro Abs: 2.7 10*3/uL (ref 1.7–7.7)
Neutrophils Relative %: 76 %
Platelets: 46 10*3/uL — ABNORMAL LOW (ref 150–400)
RBC: 4.01 MIL/uL (ref 3.87–5.11)
RDW: 13 % (ref 11.5–15.5)
WBC: 3.6 10*3/uL — ABNORMAL LOW (ref 4.0–10.5)
nRBC: 0 % (ref 0.0–0.2)

## 2021-02-10 LAB — IMMATURE PLATELET FRACTION: Immature Platelet Fraction: 10 % — ABNORMAL HIGH (ref 1.2–8.6)

## 2021-02-10 NOTE — Progress Notes (Signed)
Hematology/Oncology Consult note [facility]  [phone] [phone]  Patient Care Team: [place] as PCP - General [name], MD as PCP - Cardiology (Cardiology) Lequita Asal, MD (Inactive) as Referring Physician (Hematology and Oncology)   Name of the patient: Teresa Franklin  [id number]  December 15, 1930   Date of visit: 02/10/21  Diagnosis- 1.  Chronic ITP 2.  IgG MGUS  Chief complaint/ Reason for visit-routine follow-up of ITP and MGUS  Heme/Onc history: Patient is a 87 year old female diagnosed with ITP back in 2014.  She has not required any treatment for the same.  CT scan back in 2017 showed no evidence of splenomegaly.She also has a history of IgG MGUS which is being monitored.Work-up on 11/12/2015 revealed the following normal studies:  B12, hepatitis B core antibody total, hepatitis B surface antigen, hepatitis C antibody, and HIV antibody.  Immunofixation revealed a IgG monoclonal protein with lambda light chain specificity.  Interval history-patient is here with her son today.  She denies any spontaneous bleeding or bruising.  Reports ongoing fatigue.  No recent changes in her medications.  ECOG PS- 1 Pain scale- 0   Review of systems- Review of Systems  Constitutional:  Positive for malaise/fatigue. Negative for chills, fever and weight loss.  HENT:  Negative for congestion, ear discharge and nosebleeds.   Eyes:  Negative for blurred vision.  Respiratory:  Negative for cough, hemoptysis, sputum production, shortness of breath and wheezing.   Cardiovascular:  Negative for chest pain, palpitations, orthopnea and claudication.  Gastrointestinal:  Negative for abdominal pain, blood in stool, constipation, diarrhea, heartburn, melena, nausea and vomiting.  Genitourinary:  Negative for dysuria, flank pain, frequency, hematuria and urgency.  Musculoskeletal:  Negative for back pain, joint pain and myalgias.   Skin:  Negative for rash.  Neurological:  Negative for dizziness, tingling, focal weakness, seizures, weakness and headaches.  Endo/Heme/Allergies:  Does not bruise/bleed easily.  Psychiatric/Behavioral:  Negative for depression and suicidal ideas. The patient does not have insomnia.       Allergies  Allergen Reactions   Codeine Nausea And Vomiting   Flomax [Tamsulosin] Other (See Comments)    Pt states that this medication gave her a kidney infection.     Lactose Intolerance (Gi) Other (See Comments)    Bladder problem   Norco [Hydrocodone-Acetaminophen] Nausea And Vomiting   Sulfa Antibiotics Nausea And Vomiting   Atorvastatin Other (See Comments)    Leg pain, leg weakness     Past Medical History:  Diagnosis Date   Acute colitis 07/13/2020   Anxiety    AV block    a. 08/2015 in setting of NSTEMI and RPAV intervention-->required temp wire but not PPM.   Back pain    Chronic combined systolic (congestive) and diastolic (congestive) heart failure ([place])    a. 08/2015  Echo: EF 55-60%, no rwma, mod MR, mildly dil LA/RA, mod TR, PASP 11mHg; b. 05/2016 Echo: EF 30-35%, inflat/inf sev HK, Gr1 DD; c. 08/2017 Echo: EF 30-35%, mild LVH. Sev inf/infseptal HK. Gr1 DD. Mod MR. mildly dil LA. Mildly reduced RV fxn.   Colitis 07/17/2020   Coronary artery disease    a. 08/2015 NSTEMI/PCI: LM nl, LAD 75p (med rx), 20 diffuse, 50d, LCX nl, OM1/2 nl, RCA 217mRPAV 100 (2.25x12 Promus Premier DES) - complicated by 2:1 HB req temp wire post-PCI; b. 04/2016 MV: EF 34%, lateral HK/infarct. No ischemia.   Diverticulitis    Generalized anxiety disorder  Hepatic steatosis    HLD (hyperlipidemia)    Hypertension    Hypokalemia    Hypomagnesemia    Hypothyroidism    Ischemic cardiomyopathy    a. 08/2015  Echo: EF 55-60%; b. 05/2016 Echo: EF 30-35%; c. 08/2017 Echo: EF 30-35%.   OA (osteoarthritis)    Osteopenia    Sleep apnea    Stroke [facility])    a. 08/2015 Right PCA territory infarct/temoral lobe  cerebral infarction-->MRA showed severe flow liminting stenosis of R P2 cerebral artery;  b. 08/2015 Carotid U/S: 1-39% bilat ICA stenosis.   Subdural hemorrhage ([place])    a. 05/2013 in setting of fall.   Thrombocytopenia ([place])    Weakness      Past Surgical History:  Procedure Laterality Date   CARDIAC CATHETERIZATION N/A 09/08/2015   Procedure: Temporary Pacemaker;  Surgeon: Leonie Man, MD;  Location: [place] [facility];  Service: Cardiovascular;  Laterality: N/A;   CARDIAC CATHETERIZATION N/A 09/08/2015   Procedure: Left Heart Cath and Coronary Angiography;  Surgeon: Leonie Man, MD;  Location: [place] [facility];  Service: Cardiovascular;  Laterality: N/A;   CARDIAC CATHETERIZATION N/A 09/08/2015   Procedure: Coronary Stent Intervention;  Surgeon: Leonie Man, MD;  Location: [place] [facility];  Service: Cardiovascular;  Laterality: N/A;   CARDIAC CATHETERIZATION  09/08/2015   Procedure: Central Line Insertion;  Surgeon: Leonie Man, MD;  Location: [place] [facility];  Service: Cardiovascular;;   CARDIAC CATHETERIZATION  09/08/2015   Procedure: Arterial Line Insertion;  Surgeon: Leonie Man, MD;  Location: [place] [facility];  Service: Cardiovascular;;   CARDIAC CATHETERIZATION N/A 09/09/2015   Procedure: Temporary Wire;  Surgeon: Lorretta Harp, MD;  Location: [place] [facility];  Service: Cardiovascular;  Laterality: N/A;   COLONOSCOPY WITH PROPOFOL N/A 07/19/2020   Procedure: COLONOSCOPY WITH PROPOFOL;  Surgeon: Lin Landsman, MD;  Location: [facility] ENDOSCOPY;  Service: Gastroenterology;  Laterality: N/A;   ELBOW SURGERY     LEFT HEART CATH AND CORONARY ANGIOGRAPHY N/A 09/22/2017   Procedure: LEFT HEART CATH AND CORONARY ANGIOGRAPHY;  Surgeon: [name], MD;  Location: [place] [facility];  Service: Cardiovascular;  Laterality: N/A;   VISCERAL ANGIOGRAPHY N/A 08/07/2020   Procedure: VISCERAL ANGIOGRAPHY;  Surgeon: Katha Cabal, MD;  Location: [place]  CV LAB;  Service: Cardiovascular;  Laterality: N/A;    Social History   Socioeconomic History   Marital status: Widowed    Spouse name: Not on file   Number of children: Not on file   Years of education: Not on file   Highest education level: Not on file  Occupational History   Not on file  Tobacco Use   Smoking status: Never   Smokeless tobacco: Never  Vaping Use   Vaping Use: Never used  Substance and Sexual Activity   Alcohol use: No    Alcohol/week: 0.0 standard drinks   Drug use: No   Sexual activity: Not Currently    Birth control/protection: None  Other Topics Concern   Not on file  Social History Narrative   Not on file   Social Determinants of Health   Financial Resource Strain: Not on file  Food Insecurity: Not on file  Transportation Needs: Not on file  Physical Activity: Not on file  Stress: Not on file  Social Connections: Not on file  Intimate Partner Violence: Not on file    Family History  Problem Relation Age of Onset   Hypertension  Mother    Hyperlipidemia Mother    Stroke Mother    Diabetes Sister    Hypertension Sister    Hyperlipidemia Sister    Bipolar disorder Sister    Hyperlipidemia Sister    Stroke Sister    Prostate cancer Brother    Breast cancer Neg Hx      Current Outpatient Medications:    acetaminophen (TYLENOL) 325 MG tablet, Take 650 mg by mouth every 6 (six) hours as needed for mild pain, fever or headache. , Disp: , Rfl:    aspirin 81 MG chewable tablet, Chew 1 tablet (81 mg total) by mouth daily., Disp: , Rfl:    atorvastatin (LIPITOR) 20 MG tablet, Take 1 tablet (20 mg total) by mouth daily., Disp: 90 tablet, Rfl: 1   carvedilol (COREG) 25 MG tablet, Take 1 tablet (25 mg total) by mouth 2 (two) times daily., Disp: 180 tablet, Rfl: 3   ezetimibe (ZETIA) 10 MG tablet, Take 1 tablet (10 mg total) by mouth daily., Disp: 90 tablet, Rfl: 3   folic acid-pyridoxine-cyancobalamin (FOLBIC) 2.5-25-2 MG TABS tablet, Take 1 tablet  by mouth daily., Disp: , Rfl:    hydroxypropyl methylcellulose / hypromellose (ISOPTO TEARS / GONIOVISC) 2.5 % ophthalmic solution, Place 1 drop into both eyes 4 (four) times daily as needed for dry eyes., Disp: , Rfl:    levothyroxine (SYNTHROID, LEVOTHROID) 25 MCG tablet, Take 25 mcg by mouth daily before breakfast. , Disp: , Rfl:    Multiple Vitamins-Minerals (MULTIVITAMIN GUMMIES ADULT) CHEW, Chew 2 each by mouth at bedtime., Disp: , Rfl:    omeprazole (PRILOSEC) 20 MG capsule, Take 20 mg by mouth daily before breakfast. , Disp: , Rfl:    ondansetron (ZOFRAN) 4 MG tablet, Take 1 tablet (4 mg total) by mouth every 8 (eight) hours as needed for nausea or vomiting., Disp: 14 tablet, Rfl: 0   oxybutynin (DITROPAN) 5 MG tablet, Take 5 mg by mouth 2 (two) times daily., Disp: , Rfl:    polyethylene glycol (MIRALAX / GLYCOLAX) packet, Take 17 g by mouth daily as needed for mild constipation., Disp: 14 each, Rfl: 0   sacubitril-valsartan (ENTRESTO) 49-51 MG, Take 1 tablet by mouth 2 (two) times daily., Disp: 60 tablet, Rfl: 11   ALPRAZolam (XANAX) 0.25 MG tablet, Take 1 tablet (0.25 mg total) by mouth at bedtime as needed for anxiety. (Patient not taking: Reported on 02/10/2021), Disp: 15 tablet, Rfl: 0   nitroGLYCERIN (NITROSTAT) 0.4 MG SL tablet, Place 0.4 mg under the tongue every 5 (five) minutes as needed for chest pain. (Patient not taking: Reported on 02/10/2021), Disp: , Rfl:    Simethicone 80 MG TABS, Take 80 mg by mouth daily as needed (gas).  (Patient not taking: Reported on 02/10/2021), Disp: , Rfl:   Physical exam:  Vitals:   02/10/21 1107  BP: 131/68  Pulse: 63  Resp: 14  Temp: 98.3 F (36.8 C)  SpO2: 99%  Weight: 107 lb 12.8 oz (48.9 kg)   Physical Exam Constitutional:      General: She is not in acute distress. Cardiovascular:     Rate and Rhythm: Normal rate and regular rhythm.     Heart sounds: Normal heart sounds.  Pulmonary:     Effort: Pulmonary effort is normal.      Breath sounds: Normal breath sounds.  Abdominal:     General: Bowel sounds are normal.     Palpations: Abdomen is soft.  Skin:    General: Skin is warm  and dry.  Neurological:     Mental Status: She is alert and oriented to person, place, and time.     CMP Latest Ref Rng & Units 01/16/2021  Glucose 70 - 99 mg/dL 109(H)  BUN 8 - 27 mg/dL 13  Creatinine 0.57 - 1.00 mg/dL 0.75  Sodium 134 - 144 mmol/L 137  Potassium 3.5 - 5.2 mmol/L 4.0  Chloride 96 - 106 mmol/L 102  CO2 20 - 29 mmol/L 23  Calcium 8.7 - 10.3 mg/dL 10.5(H)  Total Protein 6.5 - 8.1 g/dL -  Total Bilirubin 0.3 - 1.2 mg/dL -  Alkaline Phos 38 - 126 U/L -  AST 15 - 41 U/L -  ALT 0 - 44 U/L -   CBC Latest Ref Rng & Units 02/10/2021  WBC 4.0 - 10.5 K/uL 3.6(L)  Hemoglobin 12.0 - 15.0 g/dL 12.6  Hematocrit 36.0 - 46.0 % 38.5  Platelets 150 - 400 K/uL 46(L)     Assessment and plan- Patient is a 87 y.o. female who is here for follow-up of following issues:  Chronic ITP: Patient's platelet count has been stable close to the 100s up until 6 months ago.  It has now drifted down to the 40s.  No recent change in her medications.  No spontaneous bleeding.  Plan is to initiate treatment for ITP with 40 mg of Decadron for 4 days and 2 days of IVIG treatment of her platelets dropped down to less than 30s.  Repeat CBC with differential in 4 weeks time and she will be seen by covering NP at that time.  As long as platelets stay more than 30 we will hold off on treating her ITP.  No indication for bone marrow biopsy at this time given that her white count is close to normal and hemoglobin is also normal.  Immature platelet fraction is elevated suggestive of a peripheral destructive process consistent with ITP  2.  IgG MGUS: We will repeat myeloma panel and serum free light chains in 4 weeks as they were not drawn today   Visit Diagnosis 1. Chronic ITP (idiopathic thrombocytopenic purpura) (HCC)      Dr. Randa Evens, MD, MPH [facility]  at [facility] [phone] 02/10/2021 4:45 PM

## 2021-02-21 ENCOUNTER — Encounter: Payer: Self-pay | Admitting: Cardiovascular Disease

## 2021-02-21 ENCOUNTER — Other Ambulatory Visit: Payer: Self-pay

## 2021-02-21 ENCOUNTER — Ambulatory Visit: Payer: Medicare PPO | Admitting: Cardiovascular Disease

## 2021-02-21 VITALS — BP 130/64 | HR 66 | Ht 62.5 in | Wt 106.1 lb

## 2021-02-21 DIAGNOSIS — K551 Chronic vascular disorders of intestine: Secondary | ICD-10-CM | POA: Diagnosis not present

## 2021-02-21 DIAGNOSIS — I25118 Atherosclerotic heart disease of native coronary artery with other forms of angina pectoris: Secondary | ICD-10-CM

## 2021-02-21 DIAGNOSIS — E785 Hyperlipidemia, unspecified: Secondary | ICD-10-CM | POA: Diagnosis not present

## 2021-02-21 DIAGNOSIS — I10 Essential (primary) hypertension: Secondary | ICD-10-CM | POA: Diagnosis not present

## 2021-02-21 NOTE — Progress Notes (Signed)
Cardiology Office Note   Date:  02/21/2021   ID:  Marieelena, Bartko 09/12/1930, MRN [id number]  PCP:  [place]  Cardiologist:   [name], MD   Chief Complaint  Patient presents with   Other    1 month f/u c/o elevated BP. Meds reviewed verbally with pt.        History of Present Illness: Teresa Franklin is a 87 y.o. female who presents for a follow-up visit regarding coronary artery disease. She presented in June 2017 with non-ST elevation myocardial infarction which was complicated by hypotension and high-grade AV block. Cardiac catheterization showed an occluded right posterior AV groove artery which was treated successfully with PCI and drug-eluting stent placement. She did not require a permanent pacemaker. Ejection fraction was normal. There was significant disease affecting the proximal and mid LAD. Medical therapy was recommended considering her frail status and involvement of a large diagonal branch at the site of stenosis.  She also had a stroke during the same hospitalization.  Currently with no residual neurologic deficits.  She had worsening exertional dyspnea in 2016-06-13.    Nuclear stress test showed no evidence of ischemia. There was evidence of prior infarct in the lateral wall with an ejection fraction of 34%. This was followed by an echocardiogram which showed an ejection fraction of 30-35% with severe hypokinesis of the inferolateral and inferior wall, moderate mitral regurgitation and no evidence of pulmonary hypertension. Left heart catheterization was done in July 2019 which showed widely patent RCA stent with no significant restenosis.  There was moderate proximal to mid LAD disease estimated to be 60% which appeared better than before.  LVEDP was normal.  Her husband died in 06-14-18 and she had significant grief after that.  She had hypotension that required decreasing the dose of carvedilol. She was hospitalized in April with abdominal pain suspected to  be due to colitis.  She was treated with antibiotics. She ultimately underwent stent placement to the SMA for suspected chronic mesenteric ischemia. Most recent echocardiogram in April showed an EF of 40 to 45%.  She was switched from lisinopril to [facility]. She was seen recently for increased fatigue and poor appetite without chest pain. She never had chest pain with her myocardial infarction and her symptoms were mostly nausea and vomiting. She was hypertensive and I increase the dose of Entresto at that time.  In addition, clopidogrel was discontinued. She reports significant improvement in symptoms.  Her appetite has improved and she is no longer losing weight.  Her energy is improving as well.  No chest pain, nausea or vomiting.  She has been following with hematology regarding ITP.  Most recent platelet count was 45,000.   Past Medical History:  Diagnosis Date   Acute colitis 07/13/2020   Anxiety    AV block    a. 08/2015 in setting of NSTEMI and RPAV intervention-->required temp wire but not PPM.   Back pain    Chronic combined systolic (congestive) and diastolic (congestive) heart failure ([place])    a. 08/2015  Echo: EF 55-60%, no rwma, mod MR, mildly dil LA/RA, mod TR, PASP 19mmHg; b. 13-Jun-2016 Echo: EF 30-35%, inflat/inf sev HK, Gr1 DD; c. 08/2017 Echo: EF 30-35%, mild LVH. Sev inf/infseptal HK. Gr1 DD. Mod MR. mildly dil LA. Mildly reduced RV fxn.   Colitis 07/17/2020   Coronary artery disease    a. 08/2015 NSTEMI/PCI: LM nl, LAD 75p (med rx), 20 diffuse, 50d, LCX nl, OM1/2 nl,  RCA 66m, RPAV 100 (2.25x12 Promus Premier DES) - complicated by 2:1 HB req temp wire post-PCI; b. 04/2016 MV: EF 34%, lateral HK/infarct. No ischemia.   Diverticulitis    Generalized anxiety disorder    Hepatic steatosis    HLD (hyperlipidemia)    Hypertension    Hypokalemia    Hypomagnesemia    Hypothyroidism    Ischemic cardiomyopathy    a. 08/2015  Echo: EF 55-60%; b. 05/2016 Echo: EF 30-35%; c. 08/2017 Echo: EF  30-35%.   OA (osteoarthritis)    Osteopenia    Sleep apnea    Stroke [facility])    a. 08/2015 Right PCA territory infarct/temoral lobe cerebral infarction-->MRA showed severe flow liminting stenosis of R P2 cerebral artery;  b. 08/2015 Carotid U/S: 1-39% bilat ICA stenosis.   Subdural hemorrhage ([place])    a. 05/2013 in setting of fall.   Thrombocytopenia ([place])    Weakness     Past Surgical History:  Procedure Laterality Date   CARDIAC CATHETERIZATION N/A 09/08/2015   Procedure: Temporary Pacemaker;  Surgeon: Leonie Man, MD;  Location: [place] [facility];  Service: Cardiovascular;  Laterality: N/A;   CARDIAC CATHETERIZATION N/A 09/08/2015   Procedure: Left Heart Cath and Coronary Angiography;  Surgeon: Leonie Man, MD;  Location: [place] [facility];  Service: Cardiovascular;  Laterality: N/A;   CARDIAC CATHETERIZATION N/A 09/08/2015   Procedure: Coronary Stent Intervention;  Surgeon: Leonie Man, MD;  Location: [place] [facility];  Service: Cardiovascular;  Laterality: N/A;   CARDIAC CATHETERIZATION  09/08/2015   Procedure: Central Line Insertion;  Surgeon: Leonie Man, MD;  Location: [place] [facility];  Service: Cardiovascular;;   CARDIAC CATHETERIZATION  09/08/2015   Procedure: Arterial Line Insertion;  Surgeon: Leonie Man, MD;  Location: [place] [facility];  Service: Cardiovascular;;   CARDIAC CATHETERIZATION N/A 09/09/2015   Procedure: Temporary Wire;  Surgeon: Lorretta Harp, MD;  Location: [place] [facility];  Service: Cardiovascular;  Laterality: N/A;   COLONOSCOPY WITH PROPOFOL N/A 07/19/2020   Procedure: COLONOSCOPY WITH PROPOFOL;  Surgeon: Lin Landsman, MD;  Location: [facility] ENDOSCOPY;  Service: Gastroenterology;  Laterality: N/A;   ELBOW SURGERY     LEFT HEART CATH AND CORONARY ANGIOGRAPHY N/A 09/22/2017   Procedure: LEFT HEART CATH AND CORONARY ANGIOGRAPHY;  Surgeon: [name], MD;  Location: [place] [facility];  Service: Cardiovascular;  Laterality: N/A;    VISCERAL ANGIOGRAPHY N/A 08/07/2020   Procedure: VISCERAL ANGIOGRAPHY;  Surgeon: Katha Cabal, MD;  Location: [place] [facility];  Service: Cardiovascular;  Laterality: N/A;     Current Outpatient Medications  Medication Sig Dispense Refill   acetaminophen (TYLENOL) 325 MG tablet Take 650 mg by mouth every 6 (six) hours as needed for mild pain, fever or headache.      ALPRAZolam (XANAX) 0.25 MG tablet Take 1 tablet (0.25 mg total) by mouth at bedtime as needed for anxiety. 15 tablet 0   aspirin 81 MG chewable tablet Chew 1 tablet (81 mg total) by mouth daily.     atorvastatin (LIPITOR) 20 MG tablet Take 1 tablet (20 mg total) by mouth daily. 90 tablet 1   carvedilol (COREG) 25 MG tablet Take 1 tablet (25 mg total) by mouth 2 (two) times daily. 180 tablet 3   ezetimibe (ZETIA) 10 MG tablet Take 1 tablet (10 mg total) by mouth daily. 90 tablet 3   folic acid-pyridoxine-cyancobalamin (FOLBIC) 2.5-25-2 MG TABS tablet Take 1 tablet by mouth  daily.     hydroxypropyl methylcellulose / hypromellose (ISOPTO TEARS / GONIOVISC) 2.5 % ophthalmic solution Place 1 drop into both eyes 4 (four) times daily as needed for dry eyes.     levothyroxine (SYNTHROID, LEVOTHROID) 25 MCG tablet Take 25 mcg by mouth daily before breakfast.      Multiple Vitamins-Minerals (MULTIVITAMIN GUMMIES ADULT) CHEW Chew 2 each by mouth at bedtime.     nitroGLYCERIN (NITROSTAT) 0.4 MG SL tablet Place 0.4 mg under the tongue every 5 (five) minutes as needed for chest pain.     omeprazole (PRILOSEC) 20 MG capsule Take 20 mg by mouth daily before breakfast.      ondansetron (ZOFRAN) 4 MG tablet Take 1 tablet (4 mg total) by mouth every 8 (eight) hours as needed for nausea or vomiting. 14 tablet 0   oxybutynin (DITROPAN) 5 MG tablet Take 5 mg by mouth 2 (two) times daily.     polyethylene glycol (MIRALAX / GLYCOLAX) packet Take 17 g by mouth daily as needed for mild constipation. 14 each 0   sacubitril-valsartan (ENTRESTO)  49-51 MG Take 1 tablet by mouth 2 (two) times daily. 60 tablet 11   Simethicone 80 MG TABS Take 80 mg by mouth daily as needed (gas).     No current facility-administered medications for this visit.    Allergies:   Codeine, Flomax [tamsulosin], Lactose intolerance (gi), Norco [hydrocodone-acetaminophen], Sulfa antibiotics, and Atorvastatin    Social History:  The patient  reports that she has never smoked. She has never used smokeless tobacco. She reports that she does not drink alcohol and does not use drugs.   Family History:  The patient's family history includes Bipolar disorder in her sister; Diabetes in her sister; Hyperlipidemia in her mother, sister, and sister; Hypertension in her mother and sister; Prostate cancer in her brother; Stroke in her mother and sister.    ROS:  Please see the history of present illness.   Otherwise, review of systems are positive for none.   All other systems are reviewed and negative.    PHYSICAL EXAM: VS:  BP 130/64 (BP Location: Left Arm, Patient Position: Sitting, Cuff Size: Normal)   Pulse 66   Ht 5' 2.5" (1.588 m)   Wt 106 lb 2 oz (48.1 kg)   SpO2 96%   BMI 19.10 kg/m  , BMI Body mass index is 19.1 kg/m. GEN: Well nourished, well developed, in no acute distress  HEENT: normal  Neck: no JVD, carotid bruits, or masses Cardiac: RRR; 1/6 systolic ejection murmur in the aortic area, no rubs, or gallops,no edema  Respiratory:  clear to auscultation bilaterally, normal work of breathing GI: soft, nontender, nondistended, + BS MS: no deformity or atrophy  Skin: warm and dry, no rash Neuro:  Strength and sensation are intact Psych: euthymic mood, full affect   EKG:  EKG is not ordered today.    Recent Labs: 07/14/2020: TSH 0.593 07/17/2020: ALT 14 07/20/2020: Magnesium 1.8 01/16/2021: BUN 13; Creatinine, Ser 0.75; Potassium 4.0; Sodium 137 02/10/2021: Hemoglobin 12.6; Platelets 46    Lipid Panel    Component Value Date/Time   CHOL  114 09/09/2015 0350   TRIG 111 09/09/2015 0350   HDL 38 (L) 09/09/2015 0350   CHOLHDL 3.0 09/09/2015 0350   VLDL 22 09/09/2015 0350   LDLCALC 54 09/09/2015 0350      Wt Readings from Last 3 Encounters:  02/21/21 106 lb 2 oz (48.1 kg)  02/10/21 107 lb 12.8 oz (48.9 kg)  01/16/21 105 lb 8 oz (47.9 kg)       ASSESSMENT AND PLAN:  1.  Coronary artery disease involving native coronary arteries with other forms of angina:    She is currently doing very well with no recurrent anginal symptoms.  Continue medical therapy.  2. Chronic systolic  heart failure:   Most recent ejection fraction was 40 to 45%.  She appears to be euvolemic without furosemide.  Blood pressure improved since increasing Entresto.  She is also on carvedilol 25 mg twice daily.  3. Hyperlipidemia: Continue treatment with atorvastatin and Zetia.  Most recent lipid profile showed an LDL of 85.  This is close to target.  4.  Essential hypertension: Blood pressure is not well controlled since increasing the dose of Entresto.  The patient and her son report higher blood pressure readings on home blood pressure machine.  I asked him to bring the blood pressure machine with the next visit to compare with our readings.  5.  Status post SMA stent for chronic mesenteric ischemia: Stable symptoms overall.    Disposition:   FU with me in 4 months  Signed,  [name], MD  02/21/2021 8:19 AM    [place]

## 2021-02-21 NOTE — Patient Instructions (Signed)
Medication Instructions:  °Your physician recommends that you continue on your current medications as directed. Please refer to the Current Medication list given to you today. ° °*If you need a refill on your cardiac medications before your next appointment, please call your pharmacy* ° ° °Lab Work: °None ordered °If you have labs (blood work) drawn today and your tests are completely normal, you will receive your results only by: °MyChart Message (if you have MyChart) OR °A paper copy in the mail °If you have any lab test that is abnormal or we need to change your treatment, we will call you to review the results. ° ° °Testing/Procedures: °None ordered ° ° °Follow-Up: °At [facility], you and your health needs are our priority.  As part of our continuing mission to provide you with exceptional heart care, we have created designated Provider Care Teams.  These Care Teams include your primary Cardiologist (physician) and Advanced Practice Providers (APPs -  Physician Assistants and Nurse Practitioners) who all work together to provide you with the care you need, when you need it. ° °We recommend signing up for the patient portal called "MyChart".  Sign up information is provided on this After Visit Summary.  MyChart is used to connect with patients for Virtual Visits (Telemedicine).  Patients are able to view lab/test results, encounter notes, upcoming appointments, etc.  Non-urgent messages can be sent to your provider as well.   °To learn more about what you can do with MyChart, go to [email or web address].   ° °Your next appointment:   °4 month(s) ° °The format for your next appointment:   °In Person ° °Provider:   °You may see Muhammad Arida, MD or one of the following Advanced Practice Providers on your designated Care Team:   °Christopher Berge, NP °Ryan Dunn, PA-C °Cadence Furth, PA-C{ ° ° ° ° °Other Instructions °N/A ° °

## 2021-03-04 ENCOUNTER — Other Ambulatory Visit: Payer: Self-pay | Admitting: *Deleted

## 2021-03-04 DIAGNOSIS — I10 Essential (primary) hypertension: Secondary | ICD-10-CM

## 2021-03-04 DIAGNOSIS — K551 Chronic vascular disorders of intestine: Secondary | ICD-10-CM

## 2021-03-04 DIAGNOSIS — Z8673 Personal history of transient ischemic attack (TIA), and cerebral infarction without residual deficits: Secondary | ICD-10-CM

## 2021-03-04 DIAGNOSIS — I5022 Chronic systolic (congestive) heart failure: Secondary | ICD-10-CM

## 2021-03-04 DIAGNOSIS — I25118 Atherosclerotic heart disease of native coronary artery with other forms of angina pectoris: Secondary | ICD-10-CM

## 2021-03-04 MED ORDER — CARVEDILOL 25 MG PO TABS: 25.0000 mg | ORAL_TABLET | Freq: Two times a day (BID) | ORAL | 0 refills | Status: DC

## 2021-03-04 MED ORDER — ENTRESTO 49-51 MG PO TABS: 1.0000 | ORAL_TABLET | Freq: Two times a day (BID) | ORAL | 0 refills | Status: DC

## 2021-03-10 ENCOUNTER — Ambulatory Visit: Payer: Medicare PPO | Admitting: Oncology

## 2021-03-10 ENCOUNTER — Other Ambulatory Visit: Payer: Medicare PPO

## 2021-03-10 ENCOUNTER — Inpatient Hospital Stay (HOSPITAL_BASED_OUTPATIENT_CLINIC_OR_DEPARTMENT_OTHER): Payer: Medicare PPO | Admitting: Oncology

## 2021-03-10 ENCOUNTER — Other Ambulatory Visit: Payer: Self-pay

## 2021-03-10 ENCOUNTER — Inpatient Hospital Stay: Payer: Medicare PPO | Attending: Oncology

## 2021-03-10 ENCOUNTER — Encounter: Payer: Self-pay | Admitting: Oncology

## 2021-03-10 VITALS — BP 164/77 | HR 70 | Temp 97.4°F | Ht 62.5 in | Wt 108.0 lb

## 2021-03-10 DIAGNOSIS — D472 Monoclonal gammopathy: Secondary | ICD-10-CM

## 2021-03-10 DIAGNOSIS — D693 Immune thrombocytopenic purpura: Secondary | ICD-10-CM | POA: Diagnosis not present

## 2021-03-10 LAB — COMPREHENSIVE METABOLIC PANEL
ALT: 16 U/L (ref 0–44)
AST: 27 U/L (ref 15–41)
Albumin: 3.9 g/dL (ref 3.5–5.0)
Alkaline Phosphatase: 55 U/L (ref 38–126)
Anion gap: 8 (ref 5–15)
BUN: 13 mg/dL (ref 8–23)
CO2: 27 mmol/L (ref 22–32)
Calcium: 9.6 mg/dL (ref 8.9–10.3)
Chloride: 101 mmol/L (ref 98–111)
Creatinine, Ser: 0.69 mg/dL (ref 0.44–1.00)
GFR, Estimated: >60 mL/min (ref >60)
Glucose, Bld: 125 mg/dL — ABNORMAL HIGH (ref 70–99)
Potassium: 3.3 mmol/L — ABNORMAL LOW (ref 3.5–5.1)
Sodium: 136 mmol/L (ref 135–145)
Total Bilirubin: 0.8 mg/dL (ref 0.3–1.2)
Total Protein: 6.7 g/dL (ref 6.5–8.1)

## 2021-03-10 LAB — CBC WITH DIFFERENTIAL/PLATELET
Abs Immature Granulocytes: 0 10*3/uL (ref 0.00–0.07)
Basophils Absolute: 0 10*3/uL (ref 0.0–0.1)
Basophils Relative: 0 %
Eosinophils Absolute: 0.1 10*3/uL (ref 0.0–0.5)
Eosinophils Relative: 2 %
HCT: 41.1 % (ref 36.0–46.0)
Hemoglobin: 13.2 g/dL (ref 12.0–15.0)
Immature Granulocytes: 0 %
Lymphocytes Relative: 18 %
Lymphs Abs: 0.6 10*3/uL — ABNORMAL LOW (ref 0.7–4.0)
MCH: 30.9 pg (ref 26.0–34.0)
MCHC: 32.1 g/dL (ref 30.0–36.0)
MCV: 96.3 fL (ref 80.0–100.0)
Monocytes Absolute: 0.2 10*3/uL (ref 0.1–1.0)
Monocytes Relative: 6 %
Neutro Abs: 2.4 10*3/uL (ref 1.7–7.7)
Neutrophils Relative %: 74 %
Platelets: 56 10*3/uL — ABNORMAL LOW (ref 150–400)
RBC: 4.27 MIL/uL (ref 3.87–5.11)
RDW: 13.2 % (ref 11.5–15.5)
WBC: 3.3 10*3/uL — ABNORMAL LOW (ref 4.0–10.5)
nRBC: 0 % (ref 0.0–0.2)

## 2021-03-10 NOTE — Progress Notes (Signed)
Diarrhea x2weeks. Would like results of her lab and has questions about having an infusion.

## 2021-03-10 NOTE — Progress Notes (Signed)
Hematology/Oncology Consult note [facility]  [phone] [phone]  Patient Care Team: Mila Doce as PCP - General [name], MD as PCP - Cardiology (Cardiology) Lequita Asal, MD (Inactive) as Referring Physician (Hematology and Oncology)   Name of the patient: Teresa Franklin  [id number]  1930-08-04   Date of visit: 03/10/21  Diagnosis- 1.  Chronic ITP 2.  IgG MGUS  Chief complaint/ Reason for visit-routine follow-up of ITP and MGUS  Heme/Onc history: Patient is a 87 year old female diagnosed with ITP back in 2014.  She has not required any treatment for the same.  CT scan back in 2017 showed no evidence of splenomegaly.She also has a history of IgG MGUS which is being monitored.Work-up on 11/12/2015 revealed the following normal studies:  B12, hepatitis B core antibody total, hepatitis B surface antigen, hepatitis C antibody, and HIV antibody.  Immunofixation revealed a IgG monoclonal protein with lambda light chain specificity.  Interval history- Patient presents today with her son.  She is currently being treated for a urinary tract infection and has 2 more days on antibiotics.  UTI symptoms are improving.  Feels it may be elevating her blood pressure.  Denies any spontaneous bleeding or bruising.  States she does bruise easily but has for her whole life.  Reports ongoing fatigue.  ECOG PS- 1 Pain scale- 0   Review of systems- Review of Systems  Constitutional:  Positive for malaise/fatigue. Negative for chills, fever and weight loss.  HENT:  Negative for congestion, ear pain and tinnitus.   Eyes: Negative.  Negative for blurred vision and double vision.  Respiratory: Negative.  Negative for cough, sputum production and shortness of breath.   Cardiovascular: Negative.  Negative for chest pain, palpitations and leg swelling.  Gastrointestinal: Negative.  Negative for abdominal pain, constipation, diarrhea,  nausea and vomiting.  Genitourinary:  Negative for dysuria, frequency and urgency.  Musculoskeletal:  Negative for back pain and falls.  Skin: Negative.  Negative for rash.  Neurological: Negative.  Negative for weakness and headaches.  Endo/Heme/Allergies:  Bruises/bleeds easily.  Psychiatric/Behavioral: Negative.  Negative for depression. The patient is not nervous/anxious and does not have insomnia.       Allergies  Allergen Reactions   Codeine Nausea And Vomiting   Flomax [Tamsulosin] Other (See Comments)    Pt states that this medication gave her a kidney infection.     Lactose Intolerance (Gi) Other (See Comments)    Bladder problem   Norco [Hydrocodone-Acetaminophen] Nausea And Vomiting   Sulfa Antibiotics Nausea And Vomiting   Atorvastatin Other (See Comments)    Leg pain, leg weakness     Past Medical History:  Diagnosis Date   Acute colitis 07/13/2020   Anxiety    AV block    a. 08/2015 in setting of NSTEMI and RPAV intervention-->required temp wire but not PPM.   Back pain    Chronic combined systolic (congestive) and diastolic (congestive) heart failure ([place])    a. 08/2015  Echo: EF 55-60%, no rwma, mod MR, mildly dil LA/RA, mod TR, PASP 74mmHg; b. 05/2016 Echo: EF 30-35%, inflat/inf sev HK, Gr1 DD; c. 08/2017 Echo: EF 30-35%, mild LVH. Sev inf/infseptal HK. Gr1 DD. Mod MR. mildly dil LA. Mildly reduced RV fxn.   Colitis 07/17/2020   Coronary artery disease    a. 08/2015 NSTEMI/PCI: LM nl, LAD 75p (med rx), 20 diffuse, 50d, LCX nl, OM1/2 nl, RCA 12m, RPAV 100 (2.25x12 Promus Premier DES) -  complicated by 2:1 HB req temp wire post-PCI; b. 04/2016 MV: EF 34%, lateral HK/infarct. No ischemia.   Diverticulitis    Generalized anxiety disorder    Hepatic steatosis    HLD (hyperlipidemia)    Hypertension    Hypokalemia    Hypomagnesemia    Hypothyroidism    Ischemic cardiomyopathy    a. 08/2015  Echo: EF 55-60%; b. 05/2016 Echo: EF 30-35%; c. 08/2017 Echo: EF 30-35%.   OA  (osteoarthritis)    Osteopenia    Sleep apnea    Stroke [facility])    a. 08/2015 Right PCA territory infarct/temoral lobe cerebral infarction-->MRA showed severe flow liminting stenosis of R P2 cerebral artery;  b. 08/2015 Carotid U/S: 1-39% bilat ICA stenosis.   Subdural hemorrhage ([place])    a. 05/2013 in setting of fall.   Thrombocytopenia ([place])    Weakness      Past Surgical History:  Procedure Laterality Date   CARDIAC CATHETERIZATION N/A 09/08/2015   Procedure: Temporary Pacemaker;  Surgeon: Leonie Man, MD;  Location: [place] [facility];  Service: Cardiovascular;  Laterality: N/A;   CARDIAC CATHETERIZATION N/A 09/08/2015   Procedure: Left Heart Cath and Coronary Angiography;  Surgeon: Leonie Man, MD;  Location: [place] [facility];  Service: Cardiovascular;  Laterality: N/A;   CARDIAC CATHETERIZATION N/A 09/08/2015   Procedure: Coronary Stent Intervention;  Surgeon: Leonie Man, MD;  Location: [place] [facility];  Service: Cardiovascular;  Laterality: N/A;   CARDIAC CATHETERIZATION  09/08/2015   Procedure: Central Line Insertion;  Surgeon: Leonie Man, MD;  Location: [place] [facility];  Service: Cardiovascular;;   CARDIAC CATHETERIZATION  09/08/2015   Procedure: Arterial Line Insertion;  Surgeon: Leonie Man, MD;  Location: [place] [facility];  Service: Cardiovascular;;   CARDIAC CATHETERIZATION N/A 09/09/2015   Procedure: Temporary Wire;  Surgeon: Lorretta Harp, MD;  Location: [place] [facility];  Service: Cardiovascular;  Laterality: N/A;   COLONOSCOPY WITH PROPOFOL N/A 07/19/2020   Procedure: COLONOSCOPY WITH PROPOFOL;  Surgeon: Lin Landsman, MD;  Location: [facility] ENDOSCOPY;  Service: Gastroenterology;  Laterality: N/A;   ELBOW SURGERY     LEFT HEART CATH AND CORONARY ANGIOGRAPHY N/A 09/22/2017   Procedure: LEFT HEART CATH AND CORONARY ANGIOGRAPHY;  Surgeon: [name], MD;  Location: [place] [facility];  Service: Cardiovascular;  Laterality: N/A;   VISCERAL  ANGIOGRAPHY N/A 08/07/2020   Procedure: VISCERAL ANGIOGRAPHY;  Surgeon: Katha Cabal, MD;  Location: [place] [facility];  Service: Cardiovascular;  Laterality: N/A;    Social History   Socioeconomic History   Marital status: Widowed    Spouse name: Not on file   Number of children: Not on file   Years of education: Not on file   Highest education level: Not on file  Occupational History   Not on file  Tobacco Use   Smoking status: Never   Smokeless tobacco: Never  Vaping Use   Vaping Use: Never used  Substance and Sexual Activity   Alcohol use: No    Alcohol/week: 0.0 standard drinks   Drug use: No   Sexual activity: Not Currently    Birth control/protection: None  Other Topics Concern   Not on file  Social History Narrative   Not on file   Social Determinants of Health   Financial Resource Strain: Not on file  Food Insecurity: Not on file  Transportation Needs: Not on file  Physical Activity: Not on file  Stress: Not  on file  Social Connections: Not on file  Intimate Partner Violence: Not on file    Family History  Problem Relation Age of Onset   Hypertension Mother    Hyperlipidemia Mother    Stroke Mother    Diabetes Sister    Hypertension Sister    Hyperlipidemia Sister    Bipolar disorder Sister    Hyperlipidemia Sister    Stroke Sister    Prostate cancer Brother    Breast cancer Neg Hx      Current Outpatient Medications:    acetaminophen (TYLENOL) 325 MG tablet, Take 650 mg by mouth every 6 (six) hours as needed for mild pain, fever or headache. , Disp: , Rfl:    ALPRAZolam (XANAX) 0.25 MG tablet, Take 1 tablet (0.25 mg total) by mouth at bedtime as needed for anxiety., Disp: 15 tablet, Rfl: 0   aspirin 81 MG chewable tablet, Chew 1 tablet (81 mg total) by mouth daily., Disp: , Rfl:    carvedilol (COREG) 25 MG tablet, Take 1 tablet (25 mg total) by mouth 2 (two) times daily., Disp: 180 tablet, Rfl: 0   folic acid-pyridoxine-cyancobalamin  (FOLBIC) 2.5-25-2 MG TABS tablet, Take 1 tablet by mouth daily., Disp: , Rfl:    hydroxypropyl methylcellulose / hypromellose (ISOPTO TEARS / GONIOVISC) 2.5 % ophthalmic solution, Place 1 drop into both eyes 4 (four) times daily as needed for dry eyes., Disp: , Rfl:    levothyroxine (SYNTHROID, LEVOTHROID) 25 MCG tablet, Take 25 mcg by mouth daily before breakfast. , Disp: , Rfl:    Multiple Vitamins-Minerals (MULTIVITAMIN GUMMIES ADULT) CHEW, Chew 2 each by mouth at bedtime., Disp: , Rfl:    omeprazole (PRILOSEC) 20 MG capsule, Take 20 mg by mouth daily before breakfast. , Disp: , Rfl:    ondansetron (ZOFRAN) 4 MG tablet, Take 1 tablet (4 mg total) by mouth every 8 (eight) hours as needed for nausea or vomiting., Disp: 14 tablet, Rfl: 0   oxybutynin (DITROPAN) 5 MG tablet, Take 5 mg by mouth 2 (two) times daily., Disp: , Rfl:    polyethylene glycol (MIRALAX / GLYCOLAX) packet, Take 17 g by mouth daily as needed for mild constipation., Disp: 14 each, Rfl: 0   sacubitril-valsartan (ENTRESTO) 49-51 MG, Take 1 tablet by mouth 2 (two) times daily., Disp: 180 tablet, Rfl: 0   Simethicone 80 MG TABS, Take 80 mg by mouth daily as needed (gas)., Disp: , Rfl:    atorvastatin (LIPITOR) 20 MG tablet, Take 1 tablet (20 mg total) by mouth daily., Disp: 90 tablet, Rfl: 1   ezetimibe (ZETIA) 10 MG tablet, Take 1 tablet (10 mg total) by mouth daily., Disp: 90 tablet, Rfl: 3   nitroGLYCERIN (NITROSTAT) 0.4 MG SL tablet, Place 0.4 mg under the tongue every 5 (five) minutes as needed for chest pain. (Patient not taking: Reported on 03/10/2021), Disp: , Rfl:   Physical exam:  Vitals:   03/10/21 1457  BP: (!) 164/77  Pulse: 70  Temp: (!) 97.4 F (36.3 C)  TempSrc: Temporal  SpO2: 100%  Weight: 108 lb (49 kg)  Height: 5' 2.5" (1.588 m)   Physical Exam Constitutional:      General: She is not in acute distress. Cardiovascular:     Rate and Rhythm: Normal rate and regular rhythm.     Heart sounds: Normal  heart sounds.  Pulmonary:     Effort: Pulmonary effort is normal.     Breath sounds: Normal breath sounds.  Abdominal:  General: Bowel sounds are normal.     Palpations: Abdomen is soft.  Skin:    General: Skin is warm and dry.  Neurological:     Mental Status: She is alert and oriented to person, place, and time.     CMP Latest Ref Rng & Units 03/10/2021  Glucose 70 - 99 mg/dL 125(H)  BUN 8 - 23 mg/dL 13  Creatinine 0.44 - 1.00 mg/dL 0.69  Sodium 135 - 145 mmol/L 136  Potassium 3.5 - 5.1 mmol/L 3.3(L)  Chloride 98 - 111 mmol/L 101  CO2 22 - 32 mmol/L 27  Calcium 8.9 - 10.3 mg/dL 9.6  Total Protein 6.5 - 8.1 g/dL 6.7  Total Bilirubin 0.3 - 1.2 mg/dL 0.8  Alkaline Phos 38 - 126 U/L 55  AST 15 - 41 U/L 27  ALT 0 - 44 U/L 16   CBC Latest Ref Rng & Units 03/10/2021  WBC 4.0 - 10.5 K/uL 3.3(L)  Hemoglobin 12.0 - 15.0 g/dL 13.2  Hematocrit 36.0 - 46.0 % 41.1  Platelets 150 - 400 K/uL 56(L)     Assessment and plan- Patient is a 87 y.o. female who is here for follow-up of following issues:  Chronic ITP:  Platelet count has been trending down over the past few months and presently is 56,000.  Dr. Janese Banks has planned to initiate treatment for ITP with 40 mg of dexamethasone for 4 days and 2 days of IVIG treatment if her platelets drop down to less than 30.  Recommend repeat lab work in 1 month and see Dr. Janese Banks in 2 months with labs.  Will hold off on treating until platelets fall below 30,000.  No indication for bone marrow at this time given normal white count and hemoglobin.  Immature platelet fraction is elevated suggestive of a peripheral destructive process consistent with ITP.  2.  IgG MGUS:  Myeloma labs are pending during dictation.  Disposition- Labs in 1 month and see Dr. Janese Banks in 2 months with lab work.  I spent 25 minutes dedicated to the care of this patient (face-to-face and non-face-to-face) on the date of the encounter to include what is described in the assessment  and plan.  Visit Diagnosis 1. Chronic ITP (idiopathic thrombocytopenic purpura) (HCC)    Faythe Casa, NP 03/10/2021 3:21 PM

## 2021-03-11 LAB — KAPPA/LAMBDA LIGHT CHAINS
Kappa free light chain: 25.2 mg/L — ABNORMAL HIGH (ref 3.3–19.4)
Kappa, lambda light chain ratio: 1.4 (ref 0.26–1.65)
Lambda free light chains: 18 mg/L (ref 5.7–26.3)

## 2021-03-14 ENCOUNTER — Ambulatory Visit: Payer: Medicare PPO | Admitting: Cardiovascular Disease

## 2021-03-18 ENCOUNTER — Telehealth (INDEPENDENT_AMBULATORY_CARE_PROVIDER_SITE_OTHER): Payer: Self-pay

## 2021-03-18 LAB — MULTIPLE MYELOMA PANEL, SERUM
Albumin SerPl Elph-Mcnc: 3.7 g/dL (ref 2.9–4.4)
Albumin/Glob SerPl: 1.5 (ref 0.7–1.7)
Alpha 1: 0.3 g/dL (ref 0.0–0.4)
Alpha2 Glob SerPl Elph-Mcnc: 0.6 g/dL (ref 0.4–1.0)
B-Globulin SerPl Elph-Mcnc: 0.8 g/dL (ref 0.7–1.3)
Gamma Glob SerPl Elph-Mcnc: 0.9 g/dL (ref 0.4–1.8)
Globulin, Total: 2.6 g/dL (ref 2.2–3.9)
IgA: 112 mg/dL (ref 64–422)
IgG (Immunoglobin G), Serum: 897 mg/dL (ref 586–1602)
IgM (Immunoglobulin M), Srm: 121 mg/dL (ref 26–217)
M Protein SerPl Elph-Mcnc: 0.3 g/dL — ABNORMAL HIGH
Total Protein ELP: 6.3 g/dL (ref 6.0–8.5)

## 2021-03-18 NOTE — Telephone Encounter (Signed)
Patient called and lvm asking if she can stop taking carvedilol (COREG) 25 MG table.

## 2021-03-18 NOTE — Telephone Encounter (Signed)
Left a message on patient voicemail to contact her cardiologist about stoping Carvedilol

## 2021-04-10 ENCOUNTER — Inpatient Hospital Stay: Payer: Medicare PPO | Attending: Oncology

## 2021-05-12 ENCOUNTER — Inpatient Hospital Stay: Payer: Medicare PPO | Attending: Nurse Practitioner

## 2021-05-12 ENCOUNTER — Encounter: Payer: Self-pay | Admitting: Oncology

## 2021-05-12 ENCOUNTER — Inpatient Hospital Stay: Payer: Medicare PPO | Admitting: Oncology

## 2021-05-13 ENCOUNTER — Telehealth: Payer: Self-pay | Admitting: Oncology

## 2021-05-13 NOTE — Telephone Encounter (Signed)
Daughter called and stated that patient has moved out of the area and will be seeing a provider closer to her new home therefore she does not need to reschedule with Dr. Janese Banks at this time.

## 2021-05-16 ENCOUNTER — Other Ambulatory Visit: Payer: Self-pay | Admitting: Cardiovascular Disease

## 2021-05-16 DIAGNOSIS — I5022 Chronic systolic (congestive) heart failure: Secondary | ICD-10-CM

## 2021-05-16 DIAGNOSIS — K551 Chronic vascular disorders of intestine: Secondary | ICD-10-CM

## 2021-05-16 DIAGNOSIS — I25118 Atherosclerotic heart disease of native coronary artery with other forms of angina pectoris: Secondary | ICD-10-CM

## 2021-05-16 DIAGNOSIS — Z8673 Personal history of transient ischemic attack (TIA), and cerebral infarction without residual deficits: Secondary | ICD-10-CM

## 2021-05-16 DIAGNOSIS — I10 Essential (primary) hypertension: Secondary | ICD-10-CM

## 2021-06-03 ENCOUNTER — Ambulatory Visit: Payer: Medicare PPO | Admitting: Oncology

## 2021-06-03 ENCOUNTER — Other Ambulatory Visit: Payer: Medicare PPO

## 2021-06-04 ENCOUNTER — Ambulatory Visit: Payer: Medicare PPO | Admitting: Oncology

## 2021-06-04 ENCOUNTER — Other Ambulatory Visit: Payer: Medicare PPO

## 2021-06-06 ENCOUNTER — Other Ambulatory Visit: Payer: Self-pay | Admitting: Cardiovascular Disease

## 2021-06-12 ENCOUNTER — Ambulatory Visit (INDEPENDENT_AMBULATORY_CARE_PROVIDER_SITE_OTHER): Payer: Medicare PPO | Admitting: Nurse Practitioner

## 2021-06-12 ENCOUNTER — Encounter (INDEPENDENT_AMBULATORY_CARE_PROVIDER_SITE_OTHER): Payer: Medicare PPO

## 2021-06-21 DEATH — deceased

## 2021-06-24 ENCOUNTER — Ambulatory Visit: Payer: Medicare PPO | Admitting: Cardiovascular Disease

## 2022-01-19 ENCOUNTER — Encounter (INDEPENDENT_AMBULATORY_CARE_PROVIDER_SITE_OTHER): Payer: Self-pay

## 2023-04-01 IMAGING — CT CT ABD-PELV W/ CM
2 of 5 series · 15 of 46 positions shown, 17 images · IV contrast (APPLIED)
Comparison: None.

CLINICAL DATA: Abdominal distension.

EXAM:
CT ABDOMEN AND PELVIS WITH CONTRAST
TECHNIQUE: Multidetector CT imaging of the abdomen and pelvis was performed
using the standard protocol following bolus administration of
intravenous contrast.
CONTRAST:  75mL OMNIPAQUE IOHEXOL 300 MG/ML  SOLN

[Series 2: routine abd/pel with (person_name) · axial · 0.80mm/px · z∈[-852,-437]mm · 12 of 93 slices shown, 14 images]
[im 5/93  soft-tissue]
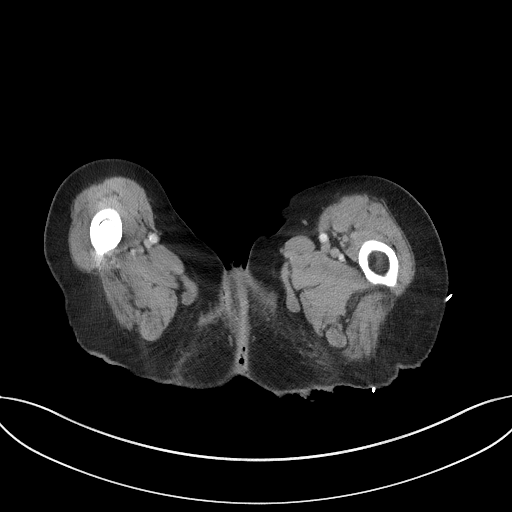
[im 5/93  bone]
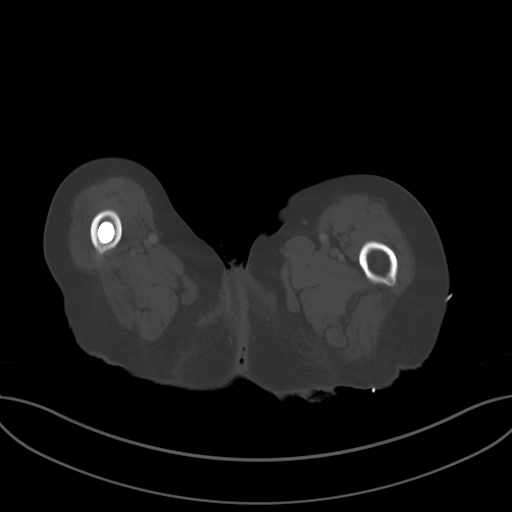
[im 15/93  soft-tissue]
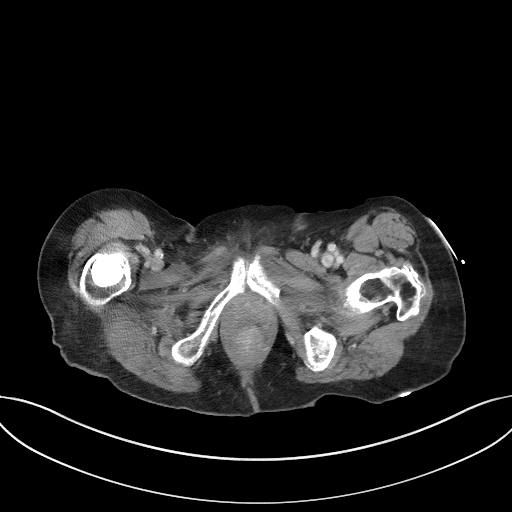
[im 20/93  soft-tissue]
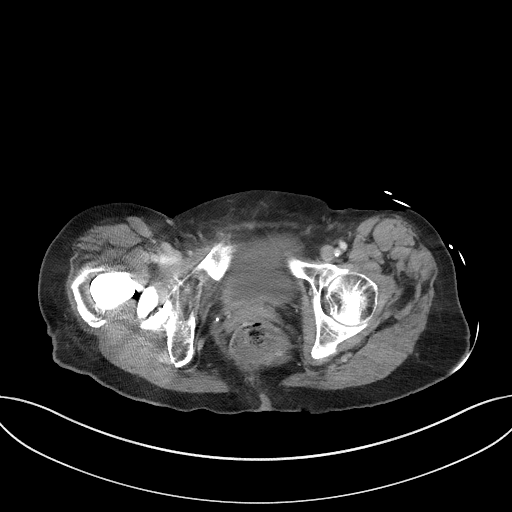
[im 30/93  soft-tissue]
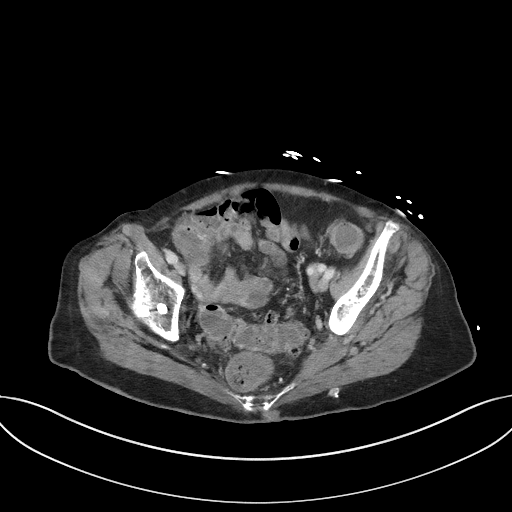
[im 34/93  soft-tissue]
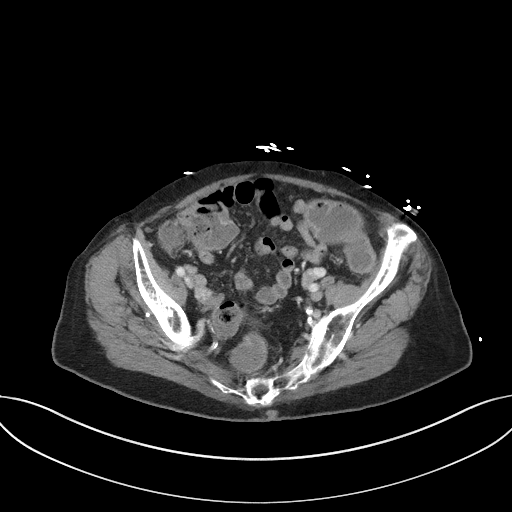
[im 44/93  soft-tissue]
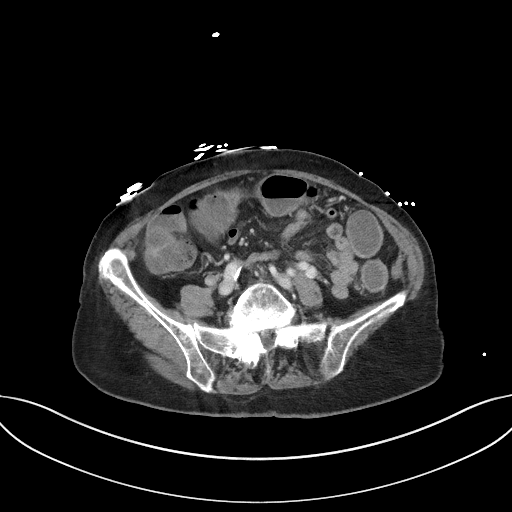
[im 49/93  soft-tissue]
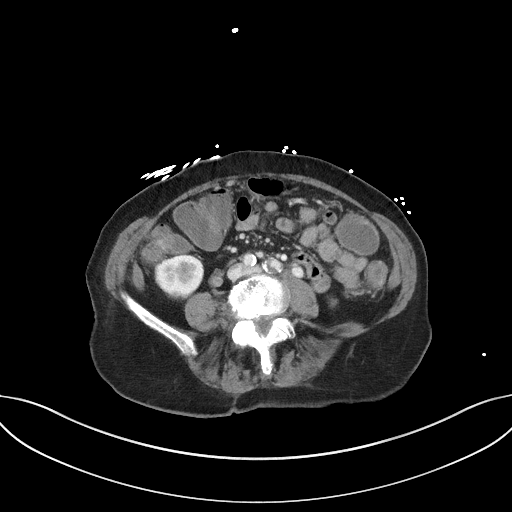
[im 59/93  soft-tissue]
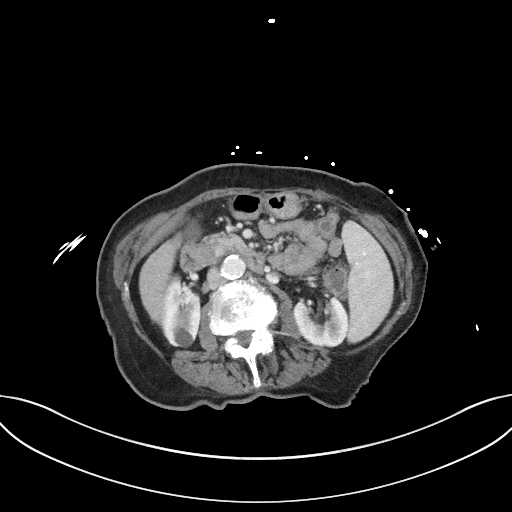
[im 63/93  soft-tissue]
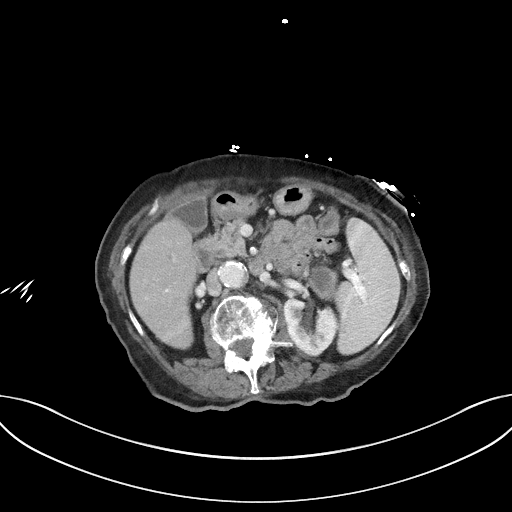
[im 63/93  bone]
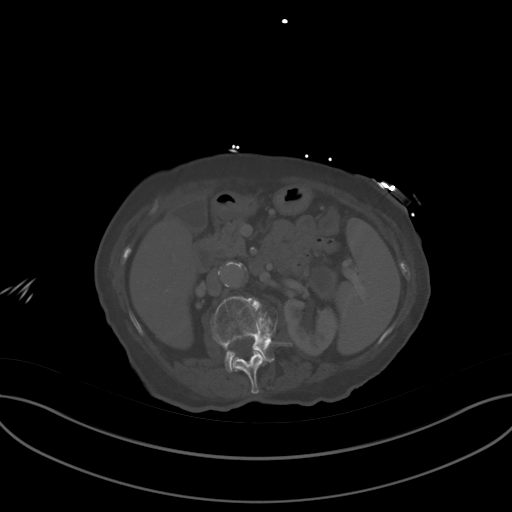
[im 73/93  soft-tissue]
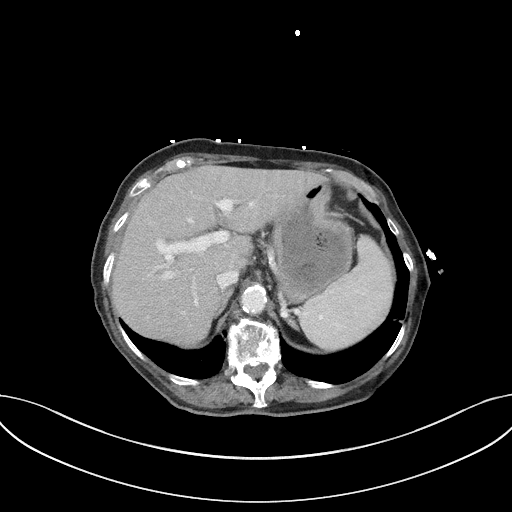
[im 78/93  soft-tissue]
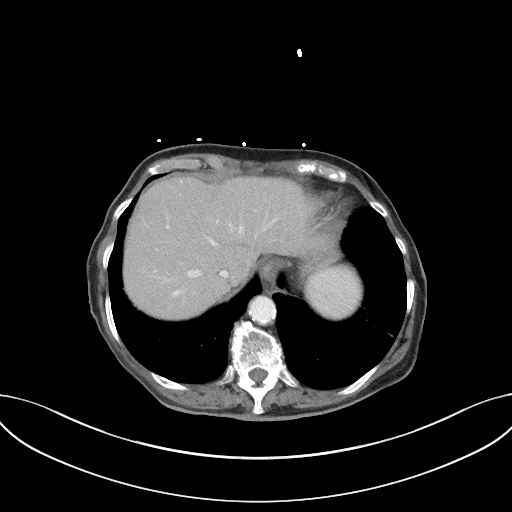
[im 88/93  soft-tissue]
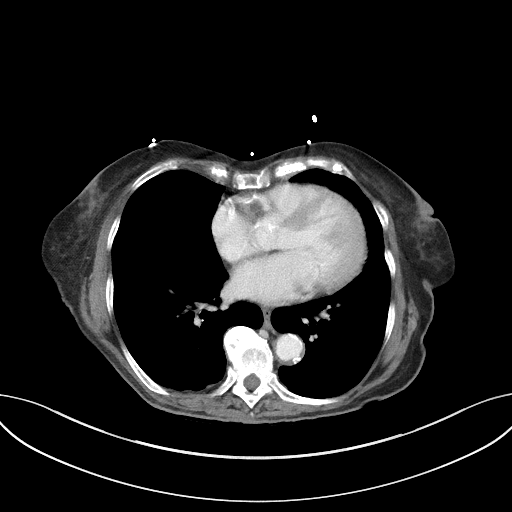

[Series 5: coronal st · coronal · 0.71mm/px · 3 of 77 slices shown]
[im 26/77  soft-tissue]
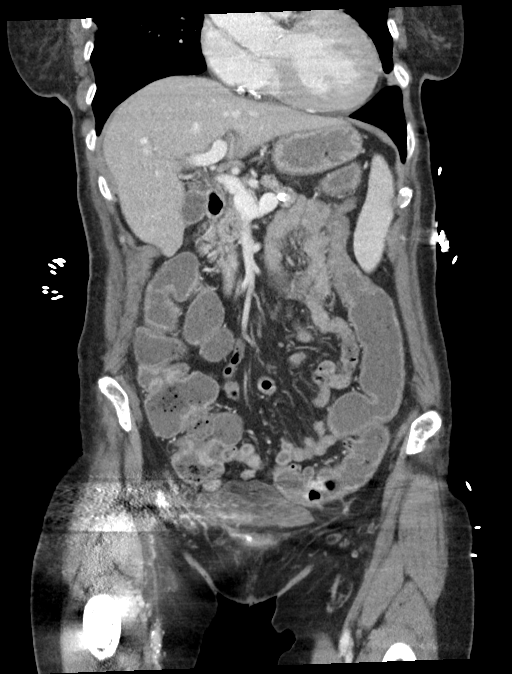
[im 34/77  soft-tissue]
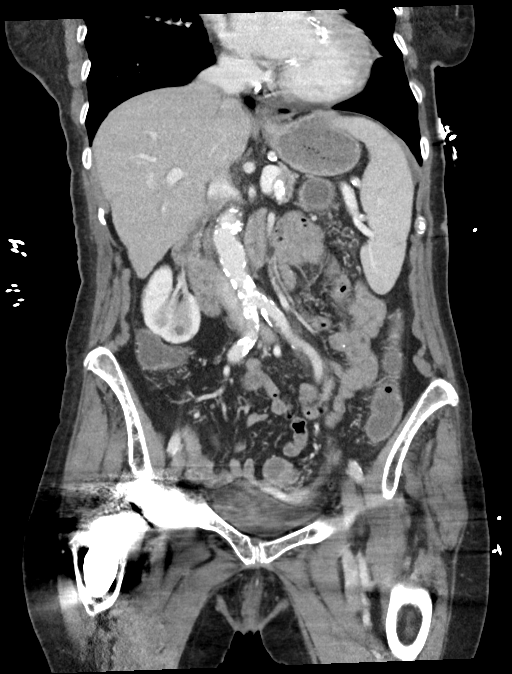
[im 43/77  soft-tissue]
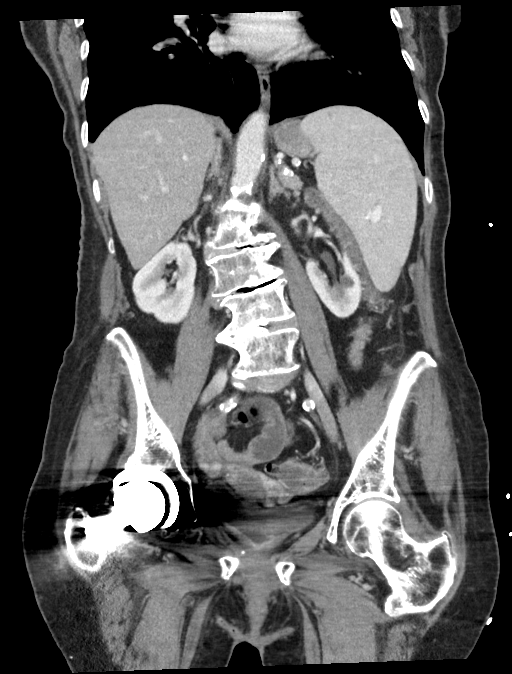

[15 of 46 positions shown; findings below may reference images not displayed]

FINDINGS: Lower chest: Linear atelectasis versus scarring within the left
lower lobe. Coronary artery calcifications.

Hepatobiliary: No focal liver abnormality. No gallstones,
gallbladder wall thickening, or pericholecystic fluid. No biliary
dilatation.

Pancreas: No focal lesion. Normal pancreatic contour. No surrounding
inflammatory changes. No main pancreatic ductal dilatation.

Spleen: Borderline enlarged spleen measuring up to 13 cm. Couple of
subcentimeter hypodensities too small to characterize ([DATE], 27).

Adrenals/Urinary Tract:

No adrenal nodule bilaterally.

Bilateral kidneys enhance symmetrically. Several subcentimeter
hypodensities are too small to characterize. A 1.5 cm fluid density
lesion within the right kidney likely represents a simple renal
cyst. No hydronephrosis. No hydroureter.

The urinary bladder is unremarkable.

Stomach/Bowel: Stomach is within normal limits. No evidence of bowel
wall thickening or dilatation. Fluid density within the lumen of the
large bowel. Mild large bowel wall thickening and haziness of the
distal transverse colon and left colon. Diffuse colonic
diverticulosis. The appendix is not definitely identified.

Vascular/Lymphatic: Main portal, splenic, superior mesenteric veins
are patent. No abdominal aorta or iliac aneurysm. Severe calcified
and noncalcified atherosclerotic plaque of the aorta and its
branches. No abdominal, pelvic, or inguinal lymphadenopathy.

Reproductive: Not well visualized due to streak artifact originating
from the right femoral surgical hardware. Uterus and bilateral
adnexal regions are grossly unremarkable.

Other: Trace free fluid within the pelvis. No intraperitoneal free
gas. No organized fluid collection.

Musculoskeletal:

No abdominal wall hernia or abnormality.

No suspicious lytic or blastic osseous lesions. No acute displaced
fracture. Old healed right pelvic fracture. Multilevel degenerative
changes of the spine in a patient with this extra scoliosis centered
at the L2-L3 level. Total right hip arthroplasty partially
visualized.
IMPRESSION: 1. Colitis of the distal transverse colon and left colon.
Differential diagnosis for etiology include ischemia, infection,
inflammation.
2. Fast transition state.
3. Diffuse colonic diverticulosis with no acute diverticulitis.
4. Mild splenomegaly.
5.  Aortic Atherosclerosis ([id number]-TE2.2).
